# Patient Record
Sex: Female | Born: 1970 | Race: Black or African American | Hispanic: No | Marital: Married | Smoking: Never smoker
Health system: Southern US, Community
[De-identification: ages and names within clinical notes are randomized; demographics above are authoritative.]

## PROBLEM LIST (undated history)

## (undated) DIAGNOSIS — F419 Anxiety disorder, unspecified: Secondary | ICD-10-CM

## (undated) DIAGNOSIS — I10 Essential (primary) hypertension: Secondary | ICD-10-CM

## (undated) DIAGNOSIS — Z9889 Other specified postprocedural states: Secondary | ICD-10-CM

## (undated) DIAGNOSIS — R519 Headache, unspecified: Secondary | ICD-10-CM

## (undated) DIAGNOSIS — E282 Polycystic ovarian syndrome: Secondary | ICD-10-CM

## (undated) DIAGNOSIS — R112 Nausea with vomiting, unspecified: Secondary | ICD-10-CM

## (undated) DIAGNOSIS — M1712 Unilateral primary osteoarthritis, left knee: Secondary | ICD-10-CM

## (undated) DIAGNOSIS — G4730 Sleep apnea, unspecified: Secondary | ICD-10-CM

## (undated) DIAGNOSIS — E785 Hyperlipidemia, unspecified: Secondary | ICD-10-CM

## (undated) DIAGNOSIS — J45909 Unspecified asthma, uncomplicated: Secondary | ICD-10-CM

## (undated) DIAGNOSIS — K219 Gastro-esophageal reflux disease without esophagitis: Secondary | ICD-10-CM

## (undated) DIAGNOSIS — R002 Palpitations: Secondary | ICD-10-CM

## (undated) DIAGNOSIS — E119 Type 2 diabetes mellitus without complications: Secondary | ICD-10-CM

## (undated) HISTORY — PX: WISDOM TOOTH EXTRACTION: SHX21

## (undated) HISTORY — DX: Unspecified asthma, uncomplicated: J45.909

## (undated) HISTORY — DX: Hyperlipidemia, unspecified: E78.5

## (undated) HISTORY — DX: Gastro-esophageal reflux disease without esophagitis: K21.9

## (undated) HISTORY — DX: Polycystic ovarian syndrome: E28.2

---

## 2011-10-29 DIAGNOSIS — J45909 Unspecified asthma, uncomplicated: Secondary | ICD-10-CM | POA: Insufficient documentation

## 2012-10-11 ENCOUNTER — Encounter (HOSPITAL_BASED_OUTPATIENT_CLINIC_OR_DEPARTMENT_OTHER): Payer: Self-pay | Admitting: *Deleted

## 2012-10-11 ENCOUNTER — Emergency Department (HOSPITAL_BASED_OUTPATIENT_CLINIC_OR_DEPARTMENT_OTHER)
Admission: EM | Admit: 2012-10-11 | Discharge: 2012-10-11 | Disposition: A | Discharge status: Home / Self Care | Payer: Commercial Managed Care - PPO | Attending: Emergency Medicine | Admitting: Emergency Medicine

## 2012-10-11 DIAGNOSIS — L600 Ingrowing nail: Secondary | ICD-10-CM

## 2012-10-11 DIAGNOSIS — L089 Local infection of the skin and subcutaneous tissue, unspecified: Secondary | ICD-10-CM

## 2012-10-11 DIAGNOSIS — Z79899 Other long term (current) drug therapy: Secondary | ICD-10-CM | POA: Insufficient documentation

## 2012-10-11 DIAGNOSIS — E119 Type 2 diabetes mellitus without complications: Secondary | ICD-10-CM | POA: Insufficient documentation

## 2012-10-11 HISTORY — DX: Type 2 diabetes mellitus without complications: E11.9

## 2012-10-11 MED ORDER — CEPHALEXIN 500 MG PO CAPS: 500.0000 mg | ORAL_CAPSULE | Freq: Four times a day (QID) | ORAL | Status: DC

## 2012-10-11 MED ORDER — FLUCONAZOLE 150 MG PO TABS: 150.0000 mg | ORAL_TABLET | Freq: Every day | ORAL | Status: DC

## 2012-10-11 MED ORDER — HYDROCODONE-ACETAMINOPHEN 5-325 MG PO TABS: ORAL_TABLET | ORAL | Status: DC

## 2012-10-11 NOTE — ED Provider Notes (Signed)
History     CSN: [id number]  Arrival date & time 10/11/12  1114   First MD Initiated Contact with Patient 10/11/12 1241      Chief Complaint  Patient presents with  . Toe Pain    (Consider location/radiation/quality/duration/timing/severity/associated sxs/prior treatment) HPI Comments: Patient who has a history of poorly controlled diabetes presents with left great toe pain.  She states the pain started about 2 weeks ago and she has noted pus draining at times, most recently 2 days ago.  She went to urgent care 8 days ago and the started her on erythromycin and anti-inflammatories for a toenail infection.  She denies fever or chills.  No N/V/D.  She states the pain in her toe has not gotten any better.  It is painful for her to walk or stand for long periods of time.  She states that her toe is still red and swollen and doesn't think the antibiotics are working.  She states that her sugars run in the upper 100s or low 200s.  She has tried warm sitz baths in epsom salts and has tried soaking her toe in hydrogen peroxide.  She has bought OTC ingrown toenail treatment which seemed to make pain worse. Onset gradual with no relief of her symptoms.    Patient is a 42 y.o. female presenting with toe pain. The history is provided by the patient.  Toe Pain Pertinent negatives include no abdominal pain, arthralgias, fever, headaches, joint swelling, nausea, neck pain, numbness, rash, vomiting or weakness.    Past Medical History  Diagnosis Date  . Diabetes mellitus without complication     No past surgical history on file.  No family history on file.  History  Substance Use Topics  . Smoking status: Never Smoker   . Smokeless tobacco: Not on file  . Alcohol Use: No    OB History    Grav Para Term Preterm Abortions TAB SAB Ect Mult Living                  Review of Systems  Constitutional: Negative for fever and activity change.  HENT: Negative for neck pain.   Gastrointestinal:  Negative for nausea, vomiting, abdominal pain and diarrhea.  Musculoskeletal: Negative for back pain, joint swelling and arthralgias.       Left great toe pain.  Skin: Positive for wound. Negative for rash.       Redness and swelling of left great toe.  Neurological: Negative for weakness, numbness and headaches.    Allergies  Macrodantin  Home Medications   Current Outpatient Rx  Name  Route  Sig  Dispense  Refill  . ALBUTEROL SULFATE (2.5 MG/3ML) 0.083% IN NEBU   Nebulization   Take 2.5 mg by nebulization every 6 (six) hours as needed.         . ALPRAZOLAM 1 MG PO TABS   Oral   Take 1 mg by mouth at bedtime as needed.         . ATORVASTATIN CALCIUM 10 MG PO TABS   Oral   Take 10 mg by mouth daily.         . BECLOMETHASONE DIPROPIONATE 40 MCG/ACT IN AERS   Inhalation   Inhale 2 puffs into the lungs 2 (two) times daily.         Marland Kitchen LANSOPRAZOLE 30 MG PO CPDR   Oral   Take 30 mg by mouth daily.         Marland Kitchen METFORMIN HCL 1000 MG  PO TABS   Oral   Take 1,000 mg by mouth 2 (two) times daily with a meal.         . RANITIDINE HCL 75 MG PO TABS   Oral   Take 75 mg by mouth 2 (two) times daily.         Marland Kitchen ZOLPIDEM TARTRATE 10 MG PO TABS   Oral   Take 10 mg by mouth at bedtime as needed.           There were no vitals taken for this visit.  Physical Exam  Nursing note and vitals reviewed. Constitutional: She is oriented to person, place, and time. She appears well-developed and well-nourished.  HENT:  Head: Normocephalic and atraumatic.  Eyes: Conjunctivae normal are normal.  Neck: Normal range of motion. Neck supple.  Pulmonary/Chest: No respiratory distress.  Neurological: She is alert and oriented to person, place, and time.       Sensation normal in lower extremities bilaterally.   Skin: Skin is warm, dry and intact. No lesion and no rash noted. There is erythema.          Mild erythema and swelling along medial aspect of left great toenail.  No  open sore or drainage.  Toenail appears ingrown.   Psychiatric: She has a normal mood and affect.    ED Course  Procedures (including critical care time)  Labs Reviewed - No data to display No results found.   1. Toe infection   2. Ingrown toenail     1:29 PM Patient seen and examined. Will switch abx to keflex. Will given pain meds. Patient requests diflucan. Urged podiatry follow-up.  Vital signs reviewed and are as follows: Filed Vitals:   10/11/12 1310  BP: 112/62  Pulse: 84  Resp: 18   Patient counseled on use of narcotic pain medications. Counseled not to combine these medications with others containing tylenol. Urged not to drink alcohol, drive, or perform any other activities that requires focus while taking these medications. The patient verbalizes understanding and agrees with the plan.    MDM  Patient with ingrown toenail and likely mild infection given history. Does not appear overly erythematous today. No drainage today. Given history of recent drainage on erythromycin, will change to Keflex. Patient complains pain. Given history of poorly controlled diabetes will defer excision of toenail podiatry. No systemic symptoms.       Renne Crigler, [place] 10/11/12 1347

## 2012-10-11 NOTE — ED Notes (Signed)
Pt having left toe pain.  Started to have pus coming out on toe.  Pt seen at urgent care.  Pt on antibiotics and anti-inflamatory medication.  Pt feels she has an ingrown toenail.

## 2012-10-15 NOTE — ED Provider Notes (Signed)
Medical screening examination/treatment/procedure(s) were performed by non-physician practitioner and as supervising physician I was immediately available for consultation/collaboration.   Loren Racer, MD 10/15/12 2233

## 2012-12-08 ENCOUNTER — Ambulatory Visit: Payer: Self-pay

## 2012-12-23 ENCOUNTER — Ambulatory Visit: Payer: Self-pay

## 2013-08-24 ENCOUNTER — Ambulatory Visit: Payer: Self-pay

## 2013-09-24 ENCOUNTER — Ambulatory Visit: Payer: Self-pay

## 2013-11-23 ENCOUNTER — Ambulatory Visit: Payer: Self-pay

## 2013-12-23 ENCOUNTER — Ambulatory Visit: Payer: Self-pay

## 2014-06-04 ENCOUNTER — Other Ambulatory Visit: Payer: Self-pay

## 2014-06-04 LAB — BASIC METABOLIC PANEL
ANION GAP: 7 (ref 7–16)
BUN: 13 mg/dL (ref 7–18)
CHLORIDE: 108 mmol/L — AB (ref 98–107)
CO2: 25 mmol/L (ref 21–32)
CREATININE: 0.84 mg/dL (ref 0.60–1.30)
Calcium, Total: 8.5 mg/dL (ref 8.5–10.1)
EGFR (Non-African Amer.): >60
Glucose: 124 mg/dL — ABNORMAL HIGH (ref 65–99)
OSMOLALITY: 281 (ref 275–301)
Potassium: 4 mmol/L (ref 3.5–5.1)
Sodium: 140 mmol/L (ref 136–145)

## 2014-10-07 ENCOUNTER — Emergency Department (HOSPITAL_BASED_OUTPATIENT_CLINIC_OR_DEPARTMENT_OTHER): Payer: 59

## 2014-10-07 ENCOUNTER — Emergency Department (HOSPITAL_BASED_OUTPATIENT_CLINIC_OR_DEPARTMENT_OTHER)
Admission: EM | Admit: 2014-10-07 | Discharge: 2014-10-07 | Disposition: A | Discharge status: Home / Self Care | Payer: 59 | Attending: Emergency Medicine | Admitting: Emergency Medicine

## 2014-10-07 ENCOUNTER — Encounter (HOSPITAL_BASED_OUTPATIENT_CLINIC_OR_DEPARTMENT_OTHER): Payer: Self-pay | Admitting: *Deleted

## 2014-10-07 DIAGNOSIS — Z792 Long term (current) use of antibiotics: Secondary | ICD-10-CM | POA: Diagnosis not present

## 2014-10-07 DIAGNOSIS — R0789 Other chest pain: Secondary | ICD-10-CM | POA: Insufficient documentation

## 2014-10-07 DIAGNOSIS — Z79899 Other long term (current) drug therapy: Secondary | ICD-10-CM | POA: Insufficient documentation

## 2014-10-07 DIAGNOSIS — R42 Dizziness and giddiness: Secondary | ICD-10-CM | POA: Diagnosis not present

## 2014-10-07 DIAGNOSIS — E119 Type 2 diabetes mellitus without complications: Secondary | ICD-10-CM | POA: Diagnosis not present

## 2014-10-07 DIAGNOSIS — R002 Palpitations: Secondary | ICD-10-CM | POA: Diagnosis present

## 2014-10-07 LAB — CBC WITH DIFFERENTIAL/PLATELET
Basophils Absolute: 0 10*3/uL (ref 0.0–0.1)
Basophils Relative: 0 % (ref 0–1)
Eosinophils Absolute: 0 10*3/uL (ref 0.0–0.7)
Eosinophils Relative: 0 % (ref 0–5)
HCT: 38.6 % (ref 36.0–46.0)
HEMOGLOBIN: 12.3 g/dL (ref 12.0–15.0)
Lymphocytes Relative: 29 % (ref 12–46)
Lymphs Abs: 2.5 10*3/uL (ref 0.7–4.0)
MCH: 28.1 pg (ref 26.0–34.0)
MCHC: 31.9 g/dL (ref 30.0–36.0)
MCV: 88.1 fL (ref 78.0–100.0)
MONOS PCT: 5 % (ref 3–12)
Monocytes Absolute: 0.5 10*3/uL (ref 0.1–1.0)
NEUTROS ABS: 5.6 10*3/uL (ref 1.7–7.7)
Neutrophils Relative %: 66 % (ref 43–77)
PLATELETS: 390 10*3/uL (ref 150–400)
RBC: 4.38 MIL/uL (ref 3.87–5.11)
RDW: 14 % (ref 11.5–15.5)
WBC: 8.6 10*3/uL (ref 4.0–10.5)

## 2014-10-07 LAB — COMPREHENSIVE METABOLIC PANEL
ALK PHOS: 100 U/L (ref 39–117)
ALT: 11 U/L (ref 0–35)
AST: 13 U/L (ref 0–37)
Albumin: 3.4 g/dL — ABNORMAL LOW (ref 3.5–5.2)
Anion gap: 7 (ref 5–15)
BILIRUBIN TOTAL: 0.3 mg/dL (ref 0.3–1.2)
BUN: 13 mg/dL (ref 6–23)
CHLORIDE: 102 meq/L (ref 96–112)
CO2: 25 mmol/L (ref 19–32)
Calcium: 8.5 mg/dL (ref 8.4–10.5)
Creatinine, Ser: 0.81 mg/dL (ref 0.50–1.10)
GFR calc Af Amer: >90 mL/min (ref >90)
GFR, EST NON AFRICAN AMERICAN: 88 mL/min — AB (ref >90)
GLUCOSE: 192 mg/dL — AB (ref 70–99)
POTASSIUM: 3.4 mmol/L — AB (ref 3.5–5.1)
Sodium: 134 mmol/L — ABNORMAL LOW (ref 135–145)
Total Protein: 7.4 g/dL (ref 6.0–8.3)

## 2014-10-07 LAB — TROPONIN I: Troponin I: <0.03 ng/mL (ref <0.031)

## 2014-10-07 IMAGING — CR DG CHEST 2V
2 series · 2 of 2 positions shown · non-contrast
Comparison: Two-view chest [DATE]

CLINICAL DATA: Heart palpitations since [REDACTED]. Mid chest pressure
beginning last night. Chest congestion for several weeks.

EXAM:
CHEST  2 VIEW

[w chest pa]
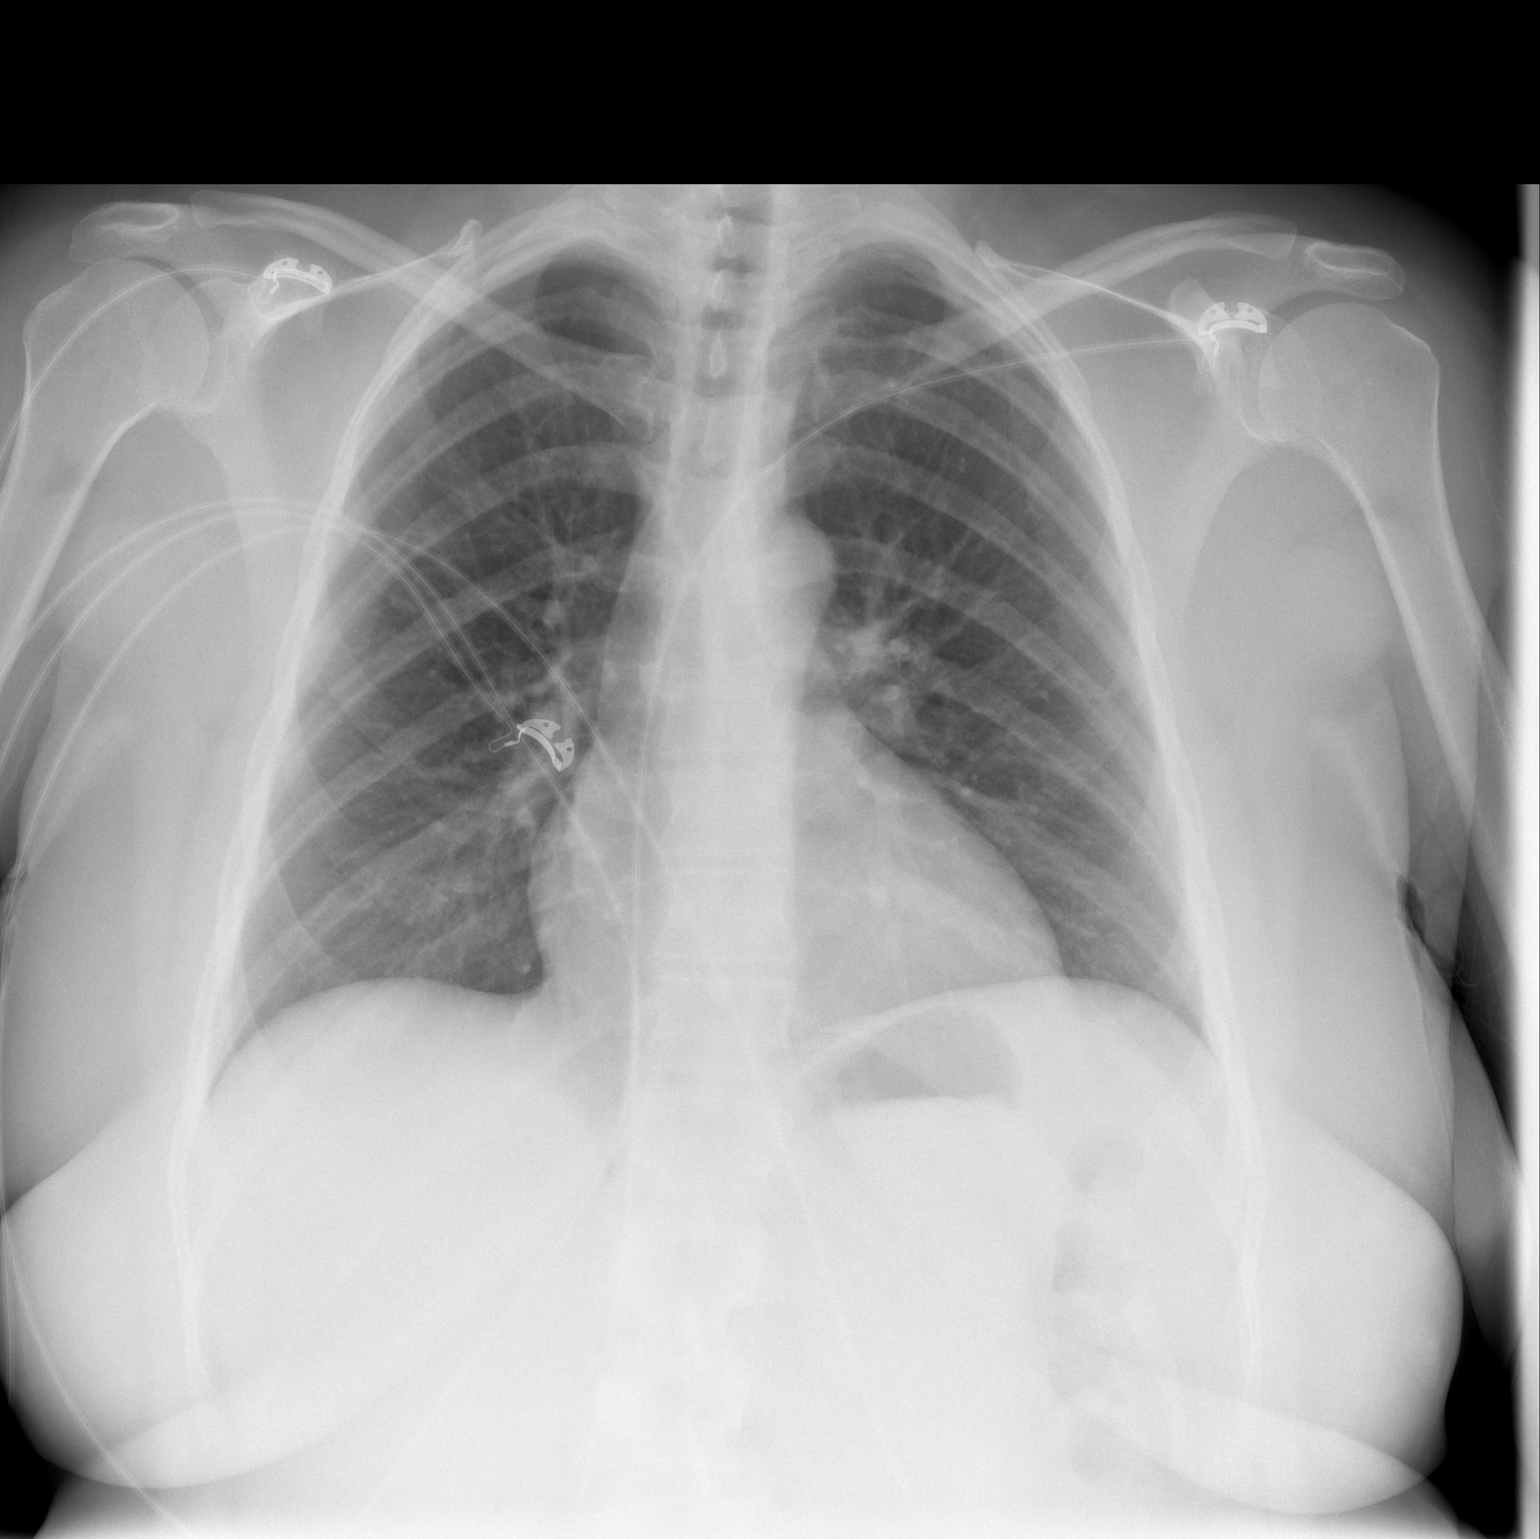

[w chest lat]
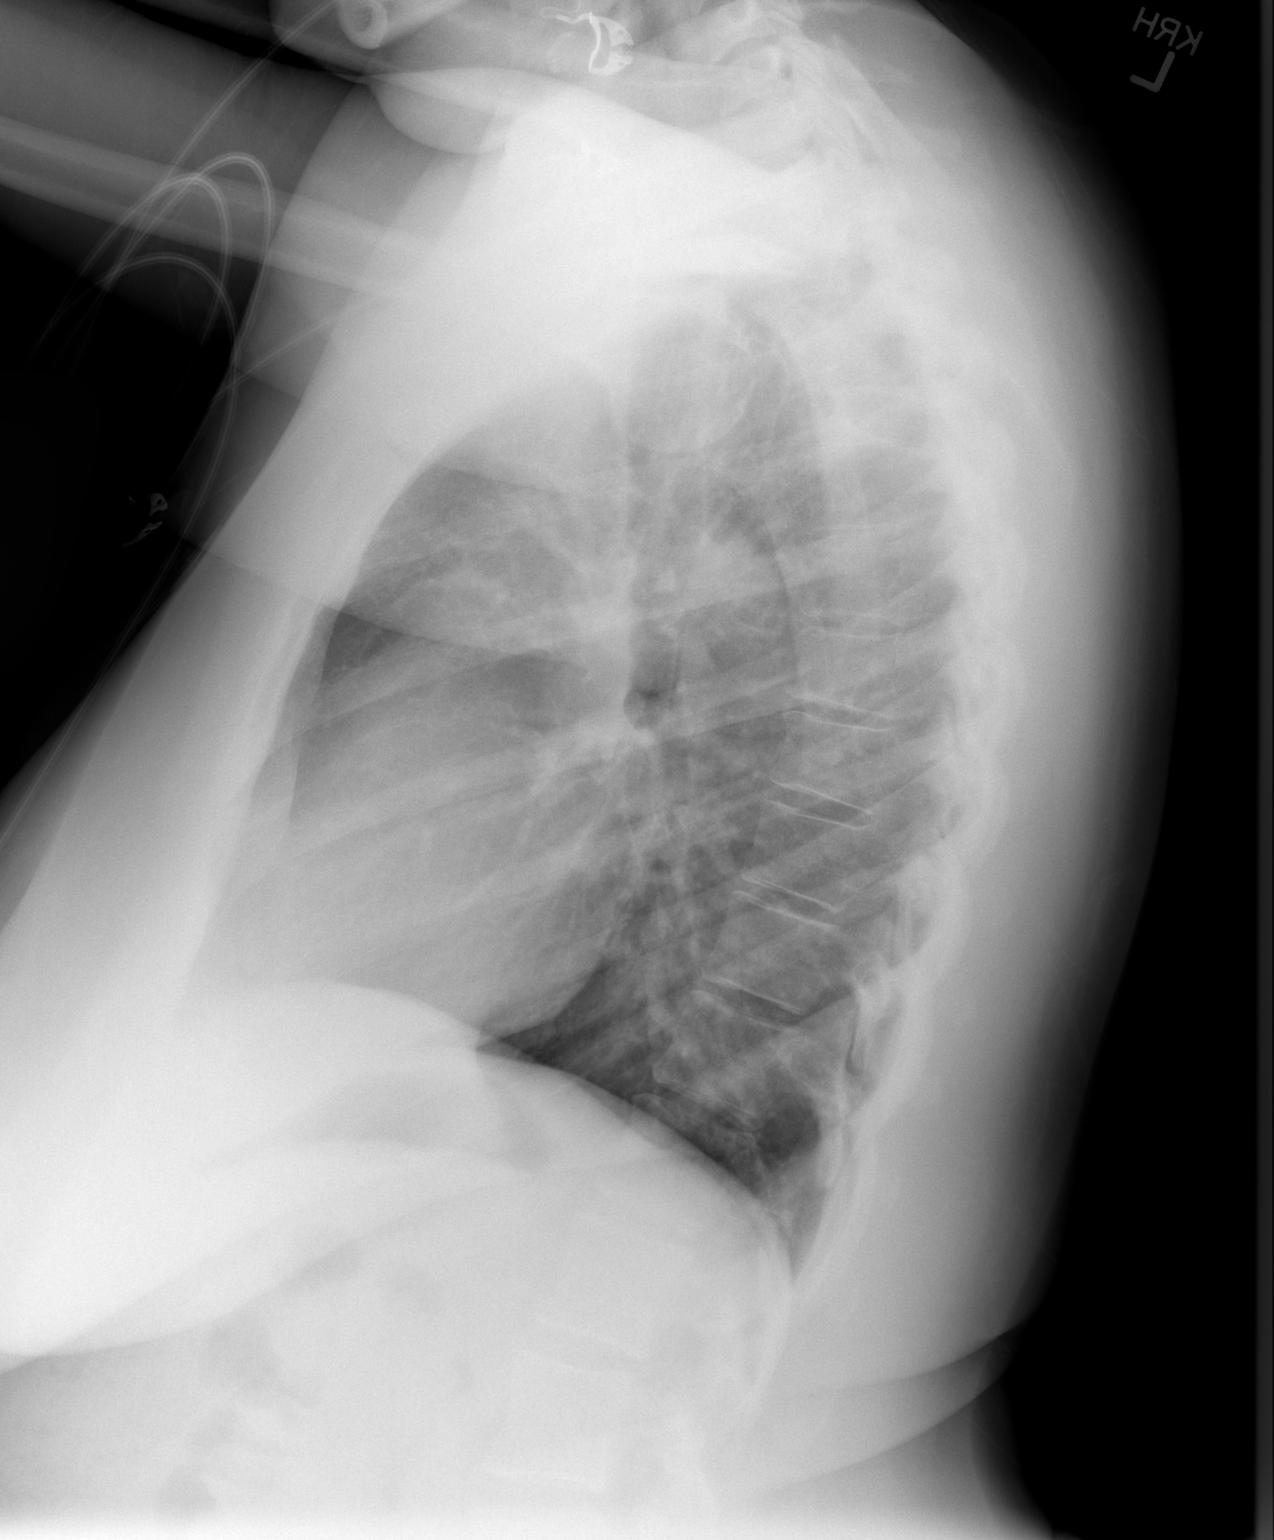

[2 of 2 positions shown; findings below may reference images not displayed]

FINDINGS: The heart size and mediastinal contours are within normal limits.
Both lungs are clear. The visualized skeletal structures are
unremarkable.
IMPRESSION: Negative two view chest x-ray

## 2014-10-07 MED ORDER — SODIUM CHLORIDE 0.9 % IV BOLUS (SEPSIS)
1000.0000 mL | Freq: Once | INTRAVENOUS | Status: AC
  Administered 2014-10-07: 1000 mL via INTRAVENOUS

## 2014-10-07 NOTE — ED Notes (Signed)
Palpitations since Monday.  Chest pressure since last night. She was seen at [facility] Tuesday for palpitations and started on Augmentin and Allegra for continued bronchitis and new onset dizziness. Was treated for bronchitis and inner ear last week.

## 2014-10-07 NOTE — ED Provider Notes (Signed)
CSN: [id number]     Arrival date & time 10/07/14  1314 History   First MD Initiated Contact with Patient 10/07/14 1324     Chief Complaint  Patient presents with  . Palpitations     (Consider location/radiation/quality/duration/timing/severity/associated sxs/prior Treatment) HPI Comments: Patient is a 44 year old female with history of diabetes, high cholesterol, acid reflux, and asthma. She presents today with complaints of cough for several weeks. She has been treated by her primary doctor with both Zithromax and Augmentin, however these have not helped. She reports intermittent productive cough but denies any chest pain, significant shortness of breath. She is also felt intermittently dizzy like she is "floating" and is also noted occasional skipped beats with her heart. She has felt somewhat tight in the chest intermittently.  Patient is a 44 y.o. female presenting with palpitations. The history is provided by the patient.  Palpitations Palpitations quality:  Irregular Onset quality:  Sudden Timing:  Intermittent Progression:  Worsening Chronicity:  New Context comment:  URI symptoms Relieved by:  Nothing Worsened by:  Nothing tried Ineffective treatments:  None tried   Past Medical History  Diagnosis Date  . Diabetes mellitus without complication    History reviewed. No pertinent past surgical history. No family history on file. History  Substance Use Topics  . Smoking status: Never Smoker   . Smokeless tobacco: Not on file  . Alcohol Use: Yes   OB History    No data available     Review of Systems  Cardiovascular: Positive for palpitations.  All other systems reviewed and are negative.     Allergies  Macrodantin  Home Medications   Prior to Admission medications   Medication Sig Start Date End Date Taking? Authorizing Provider  albuterol (PROVENTIL) (2.5 MG/3ML) 0.083% nebulizer solution Take 2.5 mg by nebulization every 6 (six) hours as needed.     Historical Provider, MD  ALPRAZolam Duanne Moron) 1 MG tablet Take 1 mg by mouth at bedtime as needed.    Historical Provider, MD  atorvastatin (LIPITOR) 10 MG tablet Take 10 mg by mouth daily.    Historical Provider, MD  beclomethasone (QVAR) 40 MCG/ACT inhaler Inhale 2 puffs into the lungs 2 (two) times daily.    Historical Provider, MD  cephALEXin (KEFLEX) 500 MG capsule Take 1 capsule (500 mg total) by mouth 4 (four) times daily. 10/11/12   Carlisle Cater, PA-C  fluconazole (DIFLUCAN) 150 MG tablet Take 1 tablet (150 mg total) by mouth daily. 10/11/12   Carlisle Cater, PA-C  HYDROcodone-acetaminophen (NORCO/VICODIN) 5-325 MG per tablet Take 1-2 tablets every 6 hours as needed for severe pain 10/11/12   Carlisle Cater, PA-C  lansoprazole (PREVACID) 30 MG capsule Take 30 mg by mouth daily.    Historical Provider, MD  metFORMIN (GLUCOPHAGE) 1000 MG tablet Take 1,000 mg by mouth 2 (two) times daily with a meal.    Historical Provider, MD  ranitidine (ZANTAC) 75 MG tablet Take 75 mg by mouth 2 (two) times daily.    Historical Provider, MD  zolpidem (AMBIEN) 10 MG tablet Take 10 mg by mouth at bedtime as needed.    Historical Provider, MD   BP 140/56 mmHg  Pulse 75  Temp(Src) 98 F (36.7 C) (Oral)  Resp 18  Ht 5\' 4"  (1.626 m)  Wt 205 lb (92.987 kg)  BMI 35.17 kg/m2  SpO2 100%  LMP 10/04/2014 Physical Exam  Constitutional: She is oriented to person, place, and time. She appears well-developed and well-nourished. No distress.  HENT:  Head: Normocephalic and atraumatic.  Mouth/Throat: Oropharynx is clear and moist.  Neck: Normal range of motion. Neck supple.  Cardiovascular: Normal rate, regular rhythm and normal heart sounds.   Pulmonary/Chest: Effort normal and breath sounds normal. No respiratory distress. She has no wheezes.  Abdominal: Soft. Bowel sounds are normal. She exhibits no distension. There is no tenderness.  Musculoskeletal: Normal range of motion. She exhibits no edema.   Lymphadenopathy:    She has no cervical adenopathy.  Neurological: She is alert and oriented to person, place, and time.  Skin: Skin is warm and dry. She is not diaphoretic.  Nursing note and vitals reviewed.   ED Course  Procedures (including critical care time) Labs Review Labs Reviewed  COMPREHENSIVE METABOLIC PANEL  TROPONIN I  CBC WITH DIFFERENTIAL    Imaging Review No results found.   EKG Interpretation   Date/Time:  Thursday October 07 2014 13:27:55 EST Ventricular Rate:  70 PR Interval:  180 QRS Duration: 76 QT Interval:  402 QTC Calculation: 434 R Axis:   35 Text Interpretation:  Normal sinus rhythm Septal infarct , age  undetermined Abnormal ECG Confirmed by DELOS  MD, Eurika Sandy ([id number]) on  10/07/2014 2:53:56 PM      MDM   Final diagnoses:  None    Patient is a 44 year old female who presents with complaints of burning in her chest, palpitations, cough, dizziness that has been ongoing for the past week. She has been treated by her primary doctor with 2 separate antibiotics and to different cough medications, however her symptoms persist. Her physical examination is unremarkable and laboratory studies reveal no significant abnormality. Her EKG and troponin are negative. Her symptoms sound infectious in nature. As she has been on 2 separate antibiotics, I suspect that this illness is viral. I will advise her to continue her medications as before and follow-up with her Dr. if not improving in the next week. Her blood pressure dropped slightly with orthostatic vital signs. She was given 1 L of normal saline for this.    Veryl Speak, MD 10/07/14 [phone]

## 2014-10-07 NOTE — ED Notes (Signed)
Pt placed on cardiac monitor 

## 2014-10-07 NOTE — Discharge Instructions (Signed)
Continue your medications as before.  Follow-up with your primary Dr. if not improving in 1 week, and return to the emergency department if your symptoms substantially worsen or change.   Chest Pain (Nonspecific) It is often hard to give a specific diagnosis for the cause of chest pain. There is always a chance that your pain could be related to something serious, such as a heart attack or a blood clot in the lungs. You need to follow up with your health care provider for further evaluation. CAUSES   Heartburn.  Pneumonia or bronchitis.  Anxiety or stress.  Inflammation around your heart (pericarditis) or lung (pleuritis or pleurisy).  A blood clot in the lung.  A collapsed lung (pneumothorax). It can develop suddenly on its own (spontaneous pneumothorax) or from trauma to the chest.  Shingles infection (herpes zoster virus). The chest wall is composed of bones, muscles, and cartilage. Any of these can be the source of the pain.  The bones can be bruised by injury.  The muscles or cartilage can be strained by coughing or overwork.  The cartilage can be affected by inflammation and become sore (costochondritis). DIAGNOSIS  Lab tests or other studies may be needed to find the cause of your pain. Your health care provider may have you take a test called an ambulatory electrocardiogram (ECG). An ECG records your heartbeat patterns over a 24-hour period. You may also have other tests, such as:  Transthoracic echocardiogram (TTE). During echocardiography, sound waves are used to evaluate how blood flows through your heart.  Transesophageal echocardiogram (TEE).  Cardiac monitoring. This allows your health care provider to monitor your heart rate and rhythm in real time.  Holter monitor. This is a portable device that records your heartbeat and can help diagnose heart arrhythmias. It allows your health care provider to track your heart activity for several days, if needed.  Stress  tests by exercise or by giving medicine that makes the heart beat faster. TREATMENT   Treatment depends on what may be causing your chest pain. Treatment may include:  Acid blockers for heartburn.  Anti-inflammatory medicine.  Pain medicine for inflammatory conditions.  Antibiotics if an infection is present.  You may be advised to change lifestyle habits. This includes stopping smoking and avoiding alcohol, caffeine, and chocolate.  You may be advised to keep your head raised (elevated) when sleeping. This reduces the chance of acid going backward from your stomach into your esophagus. Most of the time, nonspecific chest pain will improve within 2-3 days with rest and mild pain medicine.  HOME CARE INSTRUCTIONS   If antibiotics were prescribed, take them as directed. Finish them even if you start to feel better.  For the next few days, avoid physical activities that bring on chest pain. Continue physical activities as directed.  Do not use any tobacco products, including cigarettes, chewing tobacco, or electronic cigarettes.  Avoid drinking alcohol.  Only take medicine as directed by your health care provider.  Follow your health care provider's suggestions for further testing if your chest pain does not go away.  Keep any follow-up appointments you made. If you do not go to an appointment, you could develop lasting (chronic) problems with pain. If there is any problem keeping an appointment, call to reschedule. SEEK MEDICAL CARE IF:   Your chest pain does not go away, even after treatment.  You have a rash with blisters on your chest.  You have a fever. SEEK IMMEDIATE MEDICAL CARE IF:  You have increased chest pain or pain that spreads to your arm, neck, jaw, back, or abdomen.  You have shortness of breath.  You have an increasing cough, or you cough up blood.  You have severe back or abdominal pain.  You feel nauseous or vomit.  You have severe weakness.  You  faint.  You have chills. This is an emergency. Do not wait to see if the pain will go away. Get medical help at once. Call your local emergency services (911 in [place] Do not drive yourself to the hospital. MAKE SURE YOU:   Understand these instructions.  Will watch your condition.  Will get help right away if you are not doing well or get worse. Document Released: 06/20/2005 Document Revised: 09/15/2013 Document Reviewed: 04/15/2008 [facility] Patient Information 2015 [place], [place]. This information is not intended to replace advice given to you by your health care provider. Make sure you discuss any questions you have with your health care provider.  Dizziness Dizziness is a common problem. It is a feeling of unsteadiness or light-headedness. You may feel like you are about to faint. Dizziness can lead to injury if you stumble or fall. A person of any age group can suffer from dizziness, but dizziness is more common in older adults. CAUSES  Dizziness can be caused by many different things, including:  Middle ear problems.  Standing for too long.  Infections.  An allergic reaction.  Aging.  An emotional response to something, such as the sight of blood.  Side effects of medicines.  Tiredness.  Problems with circulation or blood pressure.  Excessive use of alcohol or medicines, or illegal drug use.  Breathing too fast (hyperventilation).  An irregular heart rhythm (arrhythmia).  A low red blood cell count (anemia).  Pregnancy.  Vomiting, diarrhea, fever, or other illnesses that cause body fluid loss (dehydration).  Diseases or conditions such as Parkinson's disease, high blood pressure (hypertension), diabetes, and thyroid problems.  Exposure to extreme heat. DIAGNOSIS  Your health care provider will ask about your symptoms, perform a physical exam, and perform an electrocardiogram (ECG) to record the electrical activity of your heart. Your health care provider  may also perform other heart or blood tests to determine the cause of your dizziness. These may include:  Transthoracic echocardiogram (TTE). During echocardiography, sound waves are used to evaluate how blood flows through your heart.  Transesophageal echocardiogram (TEE).  Cardiac monitoring. This allows your health care provider to monitor your heart rate and rhythm in real time.  Holter monitor. This is a portable device that records your heartbeat and can help diagnose heart arrhythmias. It allows your health care provider to track your heart activity for several days if needed.  Stress tests by exercise or by giving medicine that makes the heart beat faster. TREATMENT  Treatment of dizziness depends on the cause of your symptoms and can vary greatly. HOME CARE INSTRUCTIONS   Drink enough fluids to keep your urine clear or pale yellow. This is especially important in very hot weather. In older adults, it is also important in cold weather.  Take your medicine exactly as directed if your dizziness is caused by medicines. When taking blood pressure medicines, it is especially important to get up slowly.  Rise slowly from chairs and steady yourself until you feel okay.  In the morning, first sit up on the side of the bed. When you feel okay, stand slowly while holding onto something until you know your balance is fine.  Move your legs often if you need to stand in one place for a long time. Tighten and relax your muscles in your legs while standing.  Have someone stay with you for 1-2 days if dizziness continues to be a problem. Do this until you feel you are well enough to stay alone. Have the person call your health care provider if he or she notices changes in you that are concerning.  Do not drive or use heavy machinery if you feel dizzy.  Do not drink alcohol. SEEK IMMEDIATE MEDICAL CARE IF:   Your dizziness or light-headedness gets worse.  You feel nauseous or vomit.  You  have problems talking, walking, or using your arms, hands, or legs.  You feel weak.  You are not thinking clearly or you have trouble forming sentences. It may take a friend or family member to notice this.  You have chest pain, abdominal pain, shortness of breath, or sweating.  Your vision changes.  You notice any bleeding.  You have side effects from medicine that seems to be getting worse rather than better. MAKE SURE YOU:   Understand these instructions.  Will watch your condition.  Will get help right away if you are not doing well or get worse. Document Released: 03/06/2001 Document Revised: 09/15/2013 Document Reviewed: 03/30/2011 [facility] Patient Information 2015 [place], [place]. This information is not intended to replace advice given to you by your health care provider. Make sure you discuss any questions you have with your health care provider.

## 2015-02-16 DIAGNOSIS — G4733 Obstructive sleep apnea (adult) (pediatric): Secondary | ICD-10-CM | POA: Insufficient documentation

## 2015-03-29 ENCOUNTER — Other Ambulatory Visit: Payer: Self-pay

## 2015-03-29 NOTE — Patient Instructions (Signed)
Diabetes and Exercise Exercising regularly is important. It is not just about losing weight. It has many health benefits, such as:  Improving your overall fitness, flexibility, and endurance.  Increasing your bone density.  Helping with weight control.  Decreasing your body fat.  Increasing your muscle strength.  Reducing stress and tension.  Improving your overall health. People with diabetes who exercise gain additional benefits because exercise:  Reduces appetite.  Improves the body's use of blood sugar (glucose).  Helps lower or control blood glucose.  Decreases blood pressure.  Helps control blood lipids (such as cholesterol and triglycerides).  Improves the body's use of the hormone insulin by:  Increasing the body's insulin sensitivity.  Reducing the body's insulin needs.  Decreases the risk for heart disease because exercising:  Lowers cholesterol and triglycerides levels.  Increases the levels of good cholesterol (such as high-density lipoproteins [HDL]) in the body.  Lowers blood glucose levels. YOUR ACTIVITY PLAN  Choose an activity that you enjoy and set realistic goals. Your health care provider or diabetes educator can help you make an activity plan that works for you. Exercise regularly as directed by your health care provider. This includes:  Performing resistance training twice a week such as push-ups, sit-ups, lifting weights, or using resistance bands.  Performing 150 minutes of cardio exercises each week such as walking, running, or playing sports.  Staying active and spending no more than 90 minutes at one time being inactive. Even short bursts of exercise are good for you. Three 10-minute sessions spread throughout the day are just as beneficial as a single 30-minute session. Some exercise ideas include:  Taking the dog for a walk.  Taking the stairs instead of the elevator.  Dancing to your favorite song.  Doing an exercise  video.  Doing your favorite exercise with a friend. RECOMMENDATIONS FOR EXERCISING WITH TYPE 1 OR TYPE 2 DIABETES   Check your blood glucose before exercising. If blood glucose levels are greater than 240 mg/dL, check for urine ketones. Do not exercise if ketones are present.  Avoid injecting insulin into areas of the body that are going to be exercised. For example, avoid injecting insulin into:  The arms when playing tennis.  The legs when jogging.  Keep a record of:  Food intake before and after you exercise.  Expected peak times of insulin action.  Blood glucose levels before and after you exercise.  The type and amount of exercise you have done.  Review your records with your health care provider. Your health care provider will help you to develop guidelines for adjusting food intake and insulin amounts before and after exercising.  If you take insulin or oral hypoglycemic agents, watch for signs and symptoms of hypoglycemia. They include:  Dizziness.  Shaking.  Sweating.  Chills.  Confusion.  Drink plenty of water while you exercise to prevent dehydration or heat stroke. Body water is lost during exercise and must be replaced.  Talk to your health care provider before starting an exercise program to make sure it is safe for you. Remember, almost any type of activity is better than none. Document Released: 12/01/2003 Document Revised: 01/25/2014 Document Reviewed: 02/17/2013 ExitCare Patient Information 2015 [employer]. This information is not intended to replace advice given to you by your health care provider. Make sure you discuss any questions you have with your health care provider.  

## 2015-03-29 NOTE — Patient Outreach (Signed)
[facility]) Care Management  [place]  03/29/2015   Julie Hansen 07/01/1971 [id number]  Subjective: Patient tells me she has seen a psychiatrist to help with medications.  She has also started seeing EAP at work. The psychiatrist wants Julie Hansen to walk 4x/week for 20 minutes each time and to measure herself.   Objective: Blood sugar reports reveal blood sugars 68-174mg /gl before meals (breakfast and supper)- but generally they are around 120mg /dl. She has not taken any post meal blood sugars.   Current Medications:  Current Outpatient Prescriptions  Medication Sig Dispense Refill  . ALPRAZolam (XANAX) 1 MG tablet Take 1 mg by mouth 3 (three) times daily as needed.     Marland Kitchen atorvastatin (LIPITOR) 10 MG tablet Take 10 mg by mouth daily.    . canagliflozin (INVOKANA) 300 MG TABS tablet Take 300 mg by mouth daily before breakfast.    . glimepiride (AMARYL) 4 MG tablet Take 4 mg by mouth daily with breakfast.    . lisinopril (PRINIVIL,ZESTRIL) 5 MG tablet Take 5 mg by mouth daily.    Marland Kitchen lurasidone (LATUDA) 40 MG TABS tablet Take 40 mg by mouth daily with breakfast.    . metFORMIN (GLUCOPHAGE) 1000 MG tablet Take 1,000 mg by mouth 2 (two) times daily with a meal. Only taking the morning dose- not taking bid as ordered    . ranitidine (ZANTAC) 300 MG tablet Take 300 mg by mouth at bedtime.    Marland Kitchen zolpidem (AMBIEN) 10 MG tablet Take 10 mg by mouth at bedtime as needed.    Marland Kitchen albuterol (PROVENTIL) (2.5 MG/3ML) 0.083% nebulizer solution Take 2.5 mg by nebulization every 6 (six) hours as needed.    . beclomethasone (QVAR) 40 MCG/ACT inhaler Inhale 2 puffs into the lungs 2 (two) times daily.    . cephALEXin (KEFLEX) 500 MG capsule Take 1 capsule (500 mg total) by mouth 4 (four) times daily. (Patient not taking: Reported on 03/29/2015) 28 capsule 0  . fluconazole (DIFLUCAN) 150 MG tablet Take 1 tablet (150 mg total) by mouth daily. (Patient not taking: Reported on 03/29/2015) 1  tablet 0  . HYDROcodone-acetaminophen (NORCO/VICODIN) 5-325 MG per tablet Take 1-2 tablets every 6 hours as needed for severe pain 12 tablet 0  . lansoprazole (PREVACID) 30 MG capsule Take 30 mg by mouth 2 (two) times daily before a meal.     . ranitidine (ZANTAC) 75 MG tablet Take 75 mg by mouth 2 (two) times daily.     No current facility-administered medications for this visit.    Fall/Depression Screening: PHQ 2/9 Scores 03/29/2015  PHQ - 2 Score 2    Assessment: There is a noticeable slowing down of Julie Hansen's speech since stating on [place].  She states that she's much calmer but she still hasn't been able to engage in any of the exercise her MD has asked her to do.  She states she'll start exercising next week when she's on vacation and will start a cleansing diet using a product she calls "Standard process" on Monday, April 04, 2015 (she will eat 3 meals per day). She will continue to see her psychiatrist although every 4 weeks, vs every 6-8 weeks that's she's normally been doing.  She continues at [employer]. She is currently trying to find a therapist closer to [place] (rather than [place]).   Plan:  She needs to schedule preventative health tests; mammogram, dental, foot doctor.   She needs to continue to attend MD visits she already has scheduled;  family doctor, endocrinology, EACP, psychiatrist.   Eat three meals per day- plan and bring food to work. Avoid sweetened beverages.  F/U with me in 3 months.   Gentry Fitz, RN, BA, [place], [place] Direct Dial:  [phone]  Fax:  [phone] E-mail: [email or web address] [address], [place], [place]  [address]

## 2015-04-14 ENCOUNTER — Other Ambulatory Visit
Admission: RE | Admit: 2015-04-14 | Discharge: 2015-04-14 | Disposition: A | Discharge status: Home / Self Care | Payer: 59 | Source: Ambulatory Visit | Attending: Internal Medicine | Admitting: Internal Medicine

## 2015-04-14 DIAGNOSIS — E119 Type 2 diabetes mellitus without complications: Secondary | ICD-10-CM | POA: Diagnosis not present

## 2015-04-14 LAB — COMPREHENSIVE METABOLIC PANEL
ALK PHOS: 66 U/L (ref 38–126)
ALT: 12 U/L — ABNORMAL LOW (ref 14–54)
ANION GAP: 8 (ref 5–15)
AST: 13 U/L — AB (ref 15–41)
Albumin: 3.9 g/dL (ref 3.5–5.0)
BILIRUBIN TOTAL: 0.4 mg/dL (ref 0.3–1.2)
BUN: 13 mg/dL (ref 6–20)
CO2: 26 mmol/L (ref 22–32)
Calcium: 8.8 mg/dL — ABNORMAL LOW (ref 8.9–10.3)
Chloride: 105 mmol/L (ref 101–111)
Creatinine, Ser: 0.81 mg/dL (ref 0.44–1.00)
GFR calc Af Amer: >60 mL/min (ref >60)
Glucose, Bld: 128 mg/dL — ABNORMAL HIGH (ref 65–99)
Potassium: 3.3 mmol/L — ABNORMAL LOW (ref 3.5–5.1)
Sodium: 139 mmol/L (ref 135–145)
Total Protein: 7.7 g/dL (ref 6.5–8.1)

## 2015-04-15 LAB — MICROALBUMIN / CREATININE URINE RATIO
CREATININE, UR: 87.7 mg/dL
Microalb Creat Ratio: 6.5 mg/g creat (ref 0.0–30.0)
Microalb, Ur: 5.7 ug/mL — ABNORMAL HIGH

## 2015-04-15 LAB — HEMOGLOBIN A1C: HEMOGLOBIN A1C: 6.7 % — AB (ref 4.0–6.0)

## 2015-07-06 ENCOUNTER — Other Ambulatory Visit
Admission: RE | Admit: 2015-07-06 | Discharge: 2015-07-06 | Disposition: A | Discharge status: Home / Self Care | Payer: 59 | Source: Ambulatory Visit | Attending: Nurse Practitioner | Admitting: Nurse Practitioner

## 2015-07-06 ENCOUNTER — Other Ambulatory Visit
Admission: RE | Admit: 2015-07-06 | Discharge: 2015-07-06 | Disposition: A | Discharge status: Home / Self Care | Payer: 59 | Source: Ambulatory Visit | Attending: Internal Medicine | Admitting: Internal Medicine

## 2015-07-06 DIAGNOSIS — E119 Type 2 diabetes mellitus without complications: Secondary | ICD-10-CM | POA: Diagnosis not present

## 2015-07-06 DIAGNOSIS — Z5181 Encounter for therapeutic drug level monitoring: Secondary | ICD-10-CM | POA: Diagnosis present

## 2015-07-06 LAB — BASIC METABOLIC PANEL
ANION GAP: 7 (ref 5–15)
BUN: 13 mg/dL (ref 6–20)
CHLORIDE: 103 mmol/L (ref 101–111)
CO2: 26 mmol/L (ref 22–32)
Calcium: 9.1 mg/dL (ref 8.9–10.3)
Creatinine, Ser: 0.86 mg/dL (ref 0.44–1.00)
Glucose, Bld: 178 mg/dL — ABNORMAL HIGH (ref 65–99)
POTASSIUM: 3.7 mmol/L (ref 3.5–5.1)
SODIUM: 136 mmol/L (ref 135–145)

## 2015-07-06 LAB — HEMOGLOBIN A1C: HEMOGLOBIN A1C: 7.1 % — AB (ref 4.0–6.0)

## 2015-07-06 LAB — TSH: TSH: 1.344 u[IU]/mL (ref 0.350–4.500)

## 2015-07-07 LAB — T4: T4, Total: 8.8 ug/dL (ref 4.5–12.0)

## 2015-07-07 LAB — T3: T3 TOTAL: 108 ng/dL (ref 71–180)

## 2015-07-19 ENCOUNTER — Other Ambulatory Visit
Admission: RE | Admit: 2015-07-19 | Discharge: 2015-07-19 | Disposition: A | Discharge status: Home / Self Care | Payer: 59 | Source: Ambulatory Visit | Attending: Internal Medicine | Admitting: Internal Medicine

## 2015-07-19 DIAGNOSIS — L659 Nonscarring hair loss, unspecified: Secondary | ICD-10-CM | POA: Insufficient documentation

## 2015-07-19 LAB — VITAMIN B12: Vitamin B-12: 571 pg/mL (ref 180–914)

## 2015-07-19 LAB — HCG, QUANTITATIVE, PREGNANCY: hCG, Beta Chain, Quant, S: <1 m[IU]/mL (ref <5)

## 2015-07-21 LAB — VITAMIN B6: Vitamin B6: 11.7 ug/L (ref 2.0–32.8)

## 2015-07-23 LAB — VITAMIN D 1,25 DIHYDROXY
VITAMIN D2 1, 25 (OH): 47 pg/mL
VITAMIN D3 1, 25 (OH): 13 pg/mL
Vitamin D 1, 25 (OH)2 Total: 60 pg/mL

## 2015-08-08 ENCOUNTER — Other Ambulatory Visit: Payer: Self-pay

## 2015-08-08 NOTE — Patient Outreach (Signed)
[facility]) Care Management  08/08/2015  Julie Hansen [birth year] [id number]   Patient left 2 phone messages and 1 e-mail to rescheduled missed appointment.    Gentry Fitz, RN, BA, [place], [place] Direct Dial:  [phone]  Fax:  [phone] E-mail: [email or web address] [address], [place], [place]  [address]

## 2015-08-30 ENCOUNTER — Other Ambulatory Visit: Payer: Self-pay

## 2015-08-30 NOTE — Patient Outreach (Signed)
[facility]) Care Management  08/30/2015  Julie Hansen 1970/11/11 [id number]  Sent an e-mail message to the patient inquiring whether she can come on December 19th or 28th for follow up.   Gentry Fitz, RN, BA, [place], [place] Direct Dial:  [phone]  Fax:  [phone] E-mail: [email or web address] [address] [place], [place]  [address]

## 2015-09-12 ENCOUNTER — Other Ambulatory Visit: Payer: Self-pay

## 2015-09-12 VITALS — BP 132/78 | Ht 64.0 in | Wt 197.6 lb

## 2015-09-12 DIAGNOSIS — E119 Type 2 diabetes mellitus without complications: Secondary | ICD-10-CM

## 2015-09-12 NOTE — Patient Outreach (Signed)
[facility]) Care Management  [place]  09/12/2015   Julie Hansen 08-12-1971 [id number]  Subjective: Patient arrives for her Link to Wellness visit.  She does not have blood sugars and admits to only checking when she feels funny and she has found this tends to be when she's low.  This is happening about once every two weeks.  She admits to not taking her Phenteremine over the past week but has lost 6lbs since she started taking it. She is skipping meals, drinking sweetened beverages and occasionally forgets to take her evening dose of Amaryl.  Objective:  Filed Vitals:   09/12/15 1037  BP: 132/78  Height: 1.626 m (5\' 4" )  Weight: 197 lb 9.6 oz (89.631 kg)     Current Medications:  Current Outpatient Prescriptions  Medication Sig Dispense Refill  . albuterol (PROVENTIL) (2.5 MG/3ML) 0.083% nebulizer solution Take 2.5 mg by nebulization every 6 (six) hours as needed.    . ALPRAZolam (XANAX) 1 MG tablet Take 1 mg by mouth 2 (two) times daily as needed for anxiety.     Marland Kitchen atorvastatin (LIPITOR) 10 MG tablet Take 10 mg by mouth daily.    . beclomethasone (QVAR) 40 MCG/ACT inhaler Inhale 2 puffs into the lungs 2 (two) times daily.    . canagliflozin (INVOKANA) 300 MG TABS tablet Take 300 mg by mouth daily before breakfast.    . glimepiride (AMARYL) 4 MG tablet Take 4 mg by mouth daily with breakfast.    . lansoprazole (PREVACID) 30 MG capsule Take 30 mg by mouth 2 (two) times daily before a meal.     . lisinopril (PRINIVIL,ZESTRIL) 5 MG tablet Take 5 mg by mouth daily.    Marland Kitchen lurasidone (LATUDA) 40 MG TABS tablet Take 40 mg by mouth daily with breakfast.    . metFORMIN (GLUCOPHAGE) 1000 MG tablet Take 500 mg by mouth daily with breakfast.     . phentermine 37.5 MG capsule Take 37.5 mg by mouth every morning.    . ranitidine (ZANTAC) 300 MG tablet Take 300 mg by mouth at bedtime.    Marland Kitchen zolpidem (AMBIEN) 10 MG tablet Take 10 mg by mouth at bedtime as  needed.    . cephALEXin (KEFLEX) 500 MG capsule Take 1 capsule (500 mg total) by mouth 4 (four) times daily. (Patient not taking: Reported on 03/29/2015) 28 capsule 0  . fluconazole (DIFLUCAN) 150 MG tablet Take 1 tablet (150 mg total) by mouth daily. (Patient not taking: Reported on 03/29/2015) 1 tablet 0  . HYDROcodone-acetaminophen (NORCO/VICODIN) 5-325 MG per tablet Take 1-2 tablets every 6 hours as needed for severe pain (Patient not taking: Reported on 09/12/2015) 12 tablet 0  . ranitidine (ZANTAC) 75 MG tablet Take 75 mg by mouth 2 (two) times daily. Reported on 09/12/2015     No current facility-administered medications for this visit.    Functional Status:  In your present state of health, do you have any difficulty performing the following activities: 09/12/2015  Hearing? N  Vision? N  Difficulty concentrating or making decisions? N  Walking or climbing stairs? N  Dressing or bathing? N  Doing errands, shopping? N    Fall/Depression Screening: PHQ 2/9 Scores 09/12/2015 03/29/2015  PHQ - 2 Score 0 2    Assessment: Julie Hansen is not engaged in her diabetes care.  She has poor self esteem related to her weight- I believe she'll be more willing to be engaged if she can see success with her weight.  Plan: Take your phentermine daily.  Do some sort of exercise 1 time a week for 15 minutes (this is a start but she wasn't willing to be pushed for more).  Call MD office to find out the date of your next visit. Call the meter company to get a second meter, one for home, one for work. Follow up with me in 1 month by phone.  Gentry Fitz, RN, BA, [place], [place] Direct Dial:  [phone]  Fax:  [phone] E-mail: [email or web address] [address], [place], [place]  [address]

## 2015-10-17 ENCOUNTER — Other Ambulatory Visit
Admission: RE | Admit: 2015-10-17 | Discharge: 2015-10-17 | Disposition: A | Discharge status: Home / Self Care | Payer: 59 | Source: Ambulatory Visit | Attending: Internal Medicine | Admitting: Internal Medicine

## 2015-10-17 ENCOUNTER — Other Ambulatory Visit: Payer: Self-pay

## 2015-10-17 DIAGNOSIS — E119 Type 2 diabetes mellitus without complications: Secondary | ICD-10-CM | POA: Insufficient documentation

## 2015-10-17 DIAGNOSIS — E559 Vitamin D deficiency, unspecified: Secondary | ICD-10-CM | POA: Insufficient documentation

## 2015-10-17 LAB — HCG, QUANTITATIVE, PREGNANCY: HCG, BETA CHAIN, QUANT, S: 1 m[IU]/mL (ref <5)

## 2015-10-17 NOTE — Patient Outreach (Signed)
[facility]) Care Management  10/17/2015  Julie Hansen 04/07/1971 [id number]  Spoke with [name] today regarding her diabetes in the Link to Wellness program. She has joined the [employer] [facility] diabetes on line support program. She reports being more engaged in her healthcare and checks her blood sugars more often when she is able to have reminders on her phone.   Reports blood sugars 59-119mg /dl both fasting and in the evenings.  She reports eating only 1-2 meals a day and  Not keeping snacks with her.  She has noted that she is finding herself irritable often.  She taking her Invokana 300mg /day, Glipimeride 4mg  bid but does not take her Metformin as ordered.   She reports her weight at 195lbs checked this past Sunday.    Goals;  eat 3 meals per day and an hs snack  Keep peanut butter crackers at all times  Eat peanut butter crackers if you plan on skipping a meal  Pack your lunch when you pack your son's  Continue to check your blood sugars twice a day  Bring your meter to your MD appointment next week    Gentry Fitz, RN, [place], [place], [place]:  [phone]  Fax:  [phone] E-mail: [email or web address] [address] [place], [place]  [address]

## 2015-10-18 LAB — VITAMIN D 25 HYDROXY (VIT D DEFICIENCY, FRACTURES): Vit D, 25-Hydroxy: 36 ng/mL (ref 30.0–100.0)

## 2015-10-18 LAB — MICROALBUMIN / CREATININE URINE RATIO
Creatinine, Urine: 60.7 mg/dL
Microalb Creat Ratio: <4.9 mg/g creat (ref 0.0–30.0)

## 2015-10-19 ENCOUNTER — Other Ambulatory Visit: Payer: Self-pay | Admitting: Internal Medicine

## 2015-10-19 DIAGNOSIS — Z1231 Encounter for screening mammogram for malignant neoplasm of breast: Secondary | ICD-10-CM

## 2015-10-20 ENCOUNTER — Other Ambulatory Visit: Payer: Self-pay | Admitting: Internal Medicine

## 2015-10-20 ENCOUNTER — Ambulatory Visit
Admission: RE | Admit: 2015-10-20 | Discharge: 2015-10-20 | Disposition: A | Discharge status: Home / Self Care | Payer: 59 | Source: Ambulatory Visit | Attending: Internal Medicine | Admitting: Internal Medicine

## 2015-10-20 DIAGNOSIS — Z1231 Encounter for screening mammogram for malignant neoplasm of breast: Secondary | ICD-10-CM | POA: Diagnosis not present

## 2015-10-26 DIAGNOSIS — F3341 Major depressive disorder, recurrent, in partial remission: Secondary | ICD-10-CM | POA: Diagnosis not present

## 2015-10-26 DIAGNOSIS — F411 Generalized anxiety disorder: Secondary | ICD-10-CM | POA: Diagnosis not present

## 2015-11-02 DIAGNOSIS — E288 Other ovarian dysfunction: Secondary | ICD-10-CM | POA: Diagnosis not present

## 2015-11-02 DIAGNOSIS — N971 Female infertility of tubal origin: Secondary | ICD-10-CM | POA: Diagnosis not present

## 2015-11-02 DIAGNOSIS — Z3161 Procreative counseling and advice using natural family planning: Secondary | ICD-10-CM | POA: Diagnosis not present

## 2015-11-02 DIAGNOSIS — Z31430 Encounter of female for testing for genetic disease carrier status for procreative management: Secondary | ICD-10-CM | POA: Diagnosis not present

## 2015-11-02 DIAGNOSIS — Z130 Encounter for screening for diseases of the blood and blood-forming organs and certain disorders involving the immune mechanism: Secondary | ICD-10-CM | POA: Diagnosis not present

## 2015-11-02 DIAGNOSIS — Z13228 Encounter for screening for other metabolic disorders: Secondary | ICD-10-CM | POA: Diagnosis not present

## 2015-11-02 DIAGNOSIS — Z319 Encounter for procreative management, unspecified: Secondary | ICD-10-CM | POA: Diagnosis not present

## 2015-11-03 DIAGNOSIS — E119 Type 2 diabetes mellitus without complications: Secondary | ICD-10-CM | POA: Diagnosis not present

## 2015-11-09 DIAGNOSIS — Z319 Encounter for procreative management, unspecified: Secondary | ICD-10-CM | POA: Diagnosis not present

## 2015-11-10 DIAGNOSIS — Z319 Encounter for procreative management, unspecified: Secondary | ICD-10-CM | POA: Diagnosis not present

## 2015-11-10 DIAGNOSIS — Z3141 Encounter for fertility testing: Secondary | ICD-10-CM | POA: Diagnosis not present

## 2015-11-18 DIAGNOSIS — E6609 Other obesity due to excess calories: Secondary | ICD-10-CM | POA: Diagnosis not present

## 2015-11-18 DIAGNOSIS — E119 Type 2 diabetes mellitus without complications: Secondary | ICD-10-CM | POA: Diagnosis not present

## 2015-11-18 DIAGNOSIS — E559 Vitamin D deficiency, unspecified: Secondary | ICD-10-CM | POA: Diagnosis not present

## 2015-11-18 DIAGNOSIS — E782 Mixed hyperlipidemia: Secondary | ICD-10-CM | POA: Diagnosis not present

## 2015-12-10 DIAGNOSIS — G4733 Obstructive sleep apnea (adult) (pediatric): Secondary | ICD-10-CM | POA: Diagnosis not present

## 2015-12-14 DIAGNOSIS — Z3141 Encounter for fertility testing: Secondary | ICD-10-CM | POA: Diagnosis not present

## 2015-12-14 DIAGNOSIS — Z319 Encounter for procreative management, unspecified: Secondary | ICD-10-CM | POA: Diagnosis not present

## 2015-12-14 DIAGNOSIS — N971 Female infertility of tubal origin: Secondary | ICD-10-CM | POA: Diagnosis not present

## 2015-12-27 ENCOUNTER — Other Ambulatory Visit: Payer: Self-pay

## 2015-12-27 DIAGNOSIS — E119 Type 2 diabetes mellitus without complications: Secondary | ICD-10-CM

## 2015-12-27 NOTE — Patient Outreach (Signed)
[facility]) Care Management  12/27/2015  Julie Hansen 04-Dec-1970 [id number]   Spoke to [facility] via phone.  She reports that she is still engaged with [place] interactive glucose program through [facility].   She reports that she is not having any hypoglycemia, has more of an appetite since she started Phentermine (only been taking for 3 days since she saw Dr. Gabriel Carina) and continues to take 4mg  Amaryl although Dr. Gabriel Carina asked her to decrease it to 2mg .  She reports not taking her Metformin ("I wasn't taking it when I had my A1C drawn).  Reports blood sugars 130's fasting and when she arrives home. (historically blood sugars after work have been 70-80mg /dl and she cannot pinpoint why they have been more elevated)  She is going to document her menstral cycle as this may correlate to the higher numbers (she is not under more stress than usual and is "getting more sleep than I had in the past").  Julie Hansen has had her mammogram and will report to me once she sees her MD again.   Gentry Fitz, RN, BA, [place], [place] Direct Dial:  [phone]  Fax:  [phone] E-mail: [email or web address] [address] [place], [place]  [address]

## 2016-01-24 DIAGNOSIS — F411 Generalized anxiety disorder: Secondary | ICD-10-CM | POA: Diagnosis not present

## 2016-01-24 DIAGNOSIS — F3341 Major depressive disorder, recurrent, in partial remission: Secondary | ICD-10-CM | POA: Diagnosis not present

## 2016-03-01 DIAGNOSIS — E559 Vitamin D deficiency, unspecified: Secondary | ICD-10-CM | POA: Diagnosis not present

## 2016-03-01 DIAGNOSIS — I10 Essential (primary) hypertension: Secondary | ICD-10-CM | POA: Diagnosis not present

## 2016-03-01 DIAGNOSIS — G4733 Obstructive sleep apnea (adult) (pediatric): Secondary | ICD-10-CM | POA: Diagnosis not present

## 2016-03-01 DIAGNOSIS — E782 Mixed hyperlipidemia: Secondary | ICD-10-CM | POA: Diagnosis not present

## 2016-03-01 DIAGNOSIS — K219 Gastro-esophageal reflux disease without esophagitis: Secondary | ICD-10-CM | POA: Diagnosis not present

## 2016-04-02 DIAGNOSIS — F411 Generalized anxiety disorder: Secondary | ICD-10-CM | POA: Diagnosis not present

## 2016-04-02 DIAGNOSIS — F3341 Major depressive disorder, recurrent, in partial remission: Secondary | ICD-10-CM | POA: Diagnosis not present

## 2016-04-04 ENCOUNTER — Other Ambulatory Visit
Admission: RE | Admit: 2016-04-04 | Discharge: 2016-04-04 | Disposition: A | Discharge status: Home / Self Care | Payer: 59 | Source: Ambulatory Visit | Attending: Internal Medicine | Admitting: Internal Medicine

## 2016-04-04 DIAGNOSIS — E119 Type 2 diabetes mellitus without complications: Secondary | ICD-10-CM | POA: Diagnosis not present

## 2016-04-04 LAB — BASIC METABOLIC PANEL
ANION GAP: 7 (ref 5–15)
BUN: 11 mg/dL (ref 6–20)
CHLORIDE: 107 mmol/L (ref 101–111)
CO2: 23 mmol/L (ref 22–32)
Calcium: 9 mg/dL (ref 8.9–10.3)
Creatinine, Ser: 0.99 mg/dL (ref 0.44–1.00)
GFR calc non Af Amer: >60 mL/min (ref >60)
Glucose, Bld: 208 mg/dL — ABNORMAL HIGH (ref 65–99)
Potassium: 3.7 mmol/L (ref 3.5–5.1)
Sodium: 137 mmol/L (ref 135–145)

## 2016-04-04 LAB — LIPID PANEL
CHOLESTEROL: 194 mg/dL (ref 0–200)
HDL: 57 mg/dL (ref >40)
LDL Cholesterol: 102 mg/dL — ABNORMAL HIGH (ref 0–99)
Total CHOL/HDL Ratio: 3.4 RATIO
Triglycerides: 173 mg/dL — ABNORMAL HIGH (ref <150)
VLDL: 35 mg/dL (ref 0–40)

## 2016-04-04 LAB — HEMOGLOBIN A1C: HEMOGLOBIN A1C: 7.1 % — AB (ref 4.0–6.0)

## 2016-04-11 DIAGNOSIS — E782 Mixed hyperlipidemia: Secondary | ICD-10-CM | POA: Diagnosis not present

## 2016-04-11 DIAGNOSIS — E559 Vitamin D deficiency, unspecified: Secondary | ICD-10-CM | POA: Diagnosis not present

## 2016-04-11 DIAGNOSIS — E119 Type 2 diabetes mellitus without complications: Secondary | ICD-10-CM | POA: Diagnosis not present

## 2016-04-23 DIAGNOSIS — G4733 Obstructive sleep apnea (adult) (pediatric): Secondary | ICD-10-CM | POA: Diagnosis not present

## 2016-05-08 DIAGNOSIS — F411 Generalized anxiety disorder: Secondary | ICD-10-CM | POA: Diagnosis not present

## 2016-05-08 DIAGNOSIS — F3341 Major depressive disorder, recurrent, in partial remission: Secondary | ICD-10-CM | POA: Diagnosis not present

## 2016-05-16 ENCOUNTER — Other Ambulatory Visit: Payer: Self-pay

## 2016-05-16 VITALS — BP 120/84 | Ht 64.0 in | Wt 199.5 lb

## 2016-05-16 DIAGNOSIS — E119 Type 2 diabetes mellitus without complications: Secondary | ICD-10-CM

## 2016-05-16 NOTE — Patient Outreach (Signed)
[facility]) Care Management  [place]  05/16/2016   Marwah Disbro March 19, 1971 [id number]  Subjective: Met with patient in the Link to Wellness Diabetes program.  Akemi reports being under great stress at work and is looking at going back to school.  She is seeing a therapist who is helping her with some life issues. Vielka continues to eat sporadically, skip meals and snack, she is not checking blood sugars and she is not exercising.  She had no blood sugars to report. She is taking her diabetes medications as ordered. She complains about poor sleep.   Objective:  Vitals:   05/16/16 1206  BP: 120/84  Weight: 199 lb 8 oz (90.5 kg)  Height: 1.626 m ('5\' 4"' )     Encounter Medications:  Outpatient Encounter Prescriptions as of 05/16/2016  Medication Sig  . albuterol (PROVENTIL) (2.5 MG/3ML) 0.083% nebulizer solution Take 2.5 mg by nebulization every 6 (six) hours as needed.  . ALPRAZolam (XANAX) 1 MG tablet Take 1 mg by mouth 2 (two) times daily as needed for anxiety.   Marland Kitchen atorvastatin (LIPITOR) 10 MG tablet Take 10 mg by mouth daily.  . beclomethasone (QVAR) 40 MCG/ACT inhaler Inhale 2 puffs into the lungs 2 (two) times daily.  . canagliflozin (INVOKANA) 300 MG TABS tablet Take 300 mg by mouth daily before breakfast.  . glimepiride (AMARYL) 4 MG tablet Take 2 mg by mouth 2 (two) times daily.   . lansoprazole (PREVACID) 30 MG capsule Take 30 mg by mouth 2 (two) times daily before a meal.   . lisinopril (PRINIVIL,ZESTRIL) 5 MG tablet Take 5 mg by mouth daily.  . metFORMIN (GLUCOPHAGE) 1000 MG tablet Take 500 mg by mouth daily with breakfast. Reported on 12/27/2015  . ranitidine (ZANTAC) 300 MG tablet Take 300 mg by mouth at bedtime.  . ziprasidone (GEODON) 80 MG capsule Take 80 mg by mouth 2 (two) times daily with a meal.  . cephALEXin (KEFLEX) 500 MG capsule Take 1 capsule (500 mg total) by mouth 4 (four) times daily. (Patient not taking: Reported on  03/29/2015)  . fluconazole (DIFLUCAN) 150 MG tablet Take 1 tablet (150 mg total) by mouth daily. (Patient not taking: Reported on 03/29/2015)  . HYDROcodone-acetaminophen (NORCO/VICODIN) 5-325 MG per tablet Take 1-2 tablets every 6 hours as needed for severe pain (Patient not taking: Reported on 09/12/2015)  . lurasidone (LATUDA) 40 MG TABS tablet Take 40 mg by mouth daily with breakfast.  . phentermine 37.5 MG capsule Take 37.5 mg by mouth every morning.  . ranitidine (ZANTAC) 75 MG tablet Take 75 mg by mouth 2 (two) times daily. Reported on 10/17/2015  . zolpidem (AMBIEN) 10 MG tablet Take 10 mg by mouth at bedtime as needed. Reported on 10/17/2015   No facility-administered encounter medications on file as of 05/16/2016.     Functional Status:  In your present state of health, do you have any difficulty performing the following activities: 05/16/2016 09/12/2015  Hearing? N N  Vision? N N  Difficulty concentrating or making decisions? N N  Walking or climbing stairs? N N  Dressing or bathing? N N  Doing errands, shopping? N N  Some recent data might be hidden    Fall/Depression Screening: PHQ 2/9 Scores 05/16/2016 09/12/2015 03/29/2015  PHQ - 2 Score 0 0 2    Assessment: A1C nearly at goal but has not engaged in any of the healthy behavior she discussed with Dr. Gabriel Carina in July. Her goal is to lose weight  so exercise and diet management will aid in this- goals set to ;  Walk 1x/week for 15 minutes, eat at least 2 meals per day- pack a meal when she makes her sons and keep food in her office so she avoids going without.  She needs a dilated eye exam.   Plan:  [facility] CM Care Plan Problem One   Flowsheet Row Most Recent Value  Care Plan Problem One  Patient complains of being overweight  Role Documenting the Problem One  Care Management [place] for Problem One  Active  [facility] Long Term Goal (31-90 days)  Patient will decrease weight to less than 197.6lbs at next MD visit  [place] Start Date  05/16/16  Interventions for Problem One Long Term Goal  Discussed the non-pharmaceutical changes she could make to decrease weight, eliminate regualr sodas/sweet tea and the benefits of exercise. Discussed potential complications of poor health habits.  Discussed the benefit of exercise in aid in sleep.  Discussed time management, the opportunity to make meals and snacks when she is preparing it for her son.  Discussed the benefit of social support and accountability to herself to manage her health.     [facility] CM Care Plan Problem Two   Flowsheet Row Most Recent Value  Care Plan Problem Two  Potential for hypoglycemia  Role Documenting the Problem Two  Care Management Coordinator  Care Plan for Problem Two  Not Active  [facility] Long Term Goal (31-90) days  patient will avoid hypoglycemia  [facility] Long Term Goal Start Date  10/17/15  [facility] Long Term Goal Met Date  05/16/16      Gentry Fitz, RN, BA, [place], [place] Diabetes Coordinator- Link To Wellness Direct Dial:  [phone]  Fax:  [phone] E-mail: [email or web address] [address], [place], [place]  [address]

## 2016-06-11 ENCOUNTER — Other Ambulatory Visit: Payer: Self-pay

## 2016-06-11 NOTE — Patient Outreach (Signed)
[facility]) Care Management  [place]  06/11/2016   Julie Hansen [birth year] [id number]  Subjective: Spoke to patient by phone today regarding her diabetes as part of the Link to Wellness diabetes program. She told me when I me with her, she wanted to lose weight (goal weight 197. 6 lbs by her next visit with Dr. Gabriel Carina.  She set goals; walk 1x/week, eat at least 2 meals per day.    Encounter Medications:  Outpatient Encounter Prescriptions as of 06/11/2016  Medication Sig  . albuterol (PROVENTIL) (2.5 MG/3ML) 0.083% nebulizer solution Take 2.5 mg by nebulization every 6 (six) hours as needed.  . ALPRAZolam (XANAX) 1 MG tablet Take 1 mg by mouth 2 (two) times daily as needed for anxiety.   Marland Kitchen atorvastatin (LIPITOR) 10 MG tablet Take 10 mg by mouth daily.  . beclomethasone (QVAR) 40 MCG/ACT inhaler Inhale 2 puffs into the lungs 2 (two) times daily.  . canagliflozin (INVOKANA) 300 MG TABS tablet Take 300 mg by mouth daily before breakfast.  . cephALEXin (KEFLEX) 500 MG capsule Take 1 capsule (500 mg total) by mouth 4 (four) times daily. (Patient not taking: Reported on 03/29/2015)  . fluconazole (DIFLUCAN) 150 MG tablet Take 1 tablet (150 mg total) by mouth daily. (Patient not taking: Reported on 03/29/2015)  . glimepiride (AMARYL) 4 MG tablet Take 2 mg by mouth 2 (two) times daily.   Marland Kitchen HYDROcodone-acetaminophen (NORCO/VICODIN) 5-325 MG per tablet Take 1-2 tablets every 6 hours as needed for severe pain (Patient not taking: Reported on 09/12/2015)  . lansoprazole (PREVACID) 30 MG capsule Take 30 mg by mouth 2 (two) times daily before a meal.   . lisinopril (PRINIVIL,ZESTRIL) 5 MG tablet Take 5 mg by mouth daily.  Marland Kitchen lurasidone (LATUDA) 40 MG TABS tablet Take 40 mg by mouth daily with breakfast.  . metFORMIN (GLUCOPHAGE) 1000 MG tablet Take 500 mg by mouth daily with breakfast. Reported on 12/27/2015  . phentermine 37.5 MG capsule Take 37.5 mg by mouth every  morning.  . ranitidine (ZANTAC) 300 MG tablet Take 300 mg by mouth at bedtime.  . ranitidine (ZANTAC) 75 MG tablet Take 75 mg by mouth 2 (two) times daily. Reported on 10/17/2015  . ziprasidone (GEODON) 80 MG capsule Take 80 mg by mouth 2 (two) times daily with a meal.  . zolpidem (AMBIEN) 10 MG tablet Take 10 mg by mouth at bedtime as needed. Reported on 10/17/2015   No facility-administered encounter medications on file as of 06/11/2016.     Functional Status:  In your present state of health, do you have any difficulty performing the following activities: 05/16/2016 09/12/2015  Hearing? N N  Vision? N N  Difficulty concentrating or making decisions? N N  Walking or climbing stairs? N N  Dressing or bathing? N N  Doing errands, shopping? N N  Some recent data might be hidden    Fall/Depression Screening: PHQ 2/9 Scores 05/16/2016 09/12/2015 03/29/2015  PHQ - 2 Score 0 0 2    Assessment: Patient reports fasting blood sugars 95-115mg /dl in the morning and 2 hour post prandial blood sugars 150-160mg /dl.  She has only exercised a couple of times since I saw her and does not have a definitive plan to exercise each week.  She is parking further away from the hospital and is "using the courtesy car less".  She is still drinking sweetened beverages but tells me she has decreased her consumption.   Plan: If Julie Hansen wants to accomplish  her goal of decreased weight, she needs to create a weekly plan for exercise, perhaps engaging a partner to make her more accountable. We discussed the need to weight training as well- this will lead to increased muscle mass and will aid in blood sugar control. Counseled on the need to get rid any ANY concentrated beverages from her diet.  I will follow up with her in 1 month.  Gentry Fitz, RN, BA, MHA, CDE Diabetes Coordinator Inpatient Diabetes Program  [phone] (Team Pager) [phone] ([place]) 06/11/2016 11:04 AM

## 2016-07-18 ENCOUNTER — Other Ambulatory Visit: Payer: Self-pay

## 2016-07-18 ENCOUNTER — Ambulatory Visit: Payer: 59

## 2016-07-18 NOTE — Patient Outreach (Signed)
[facility]) Care Management  07/18/2016  Calder Ramlall [birth year] [id number]   Spoke to patient by phone for the [facility] Well[facility] diabetes program.  Yovonda is checking blood sugars every morning reporting blood sugars 101-156mg /dl.  She denies pain, falls, depression, hypo or hyperglycemia.  She is not exercising. Her last A1C was 7.1%- she is taking her medications as ordered.   She has been encouraged to log onto the [employer], the new platform for [employer] [facility] for 2018.   Current Outpatient Prescriptions on File Prior to Visit  Medication Sig Dispense Refill  . albuterol (PROVENTIL) (2.5 MG/3ML) 0.083% nebulizer solution Take 2.5 mg by nebulization every 6 (six) hours as needed.    . ALPRAZolam (XANAX) 1 MG tablet Take 1 mg by mouth 2 (two) times daily as needed for anxiety.     Marland Kitchen atorvastatin (LIPITOR) 10 MG tablet Take 10 mg by mouth daily.    . beclomethasone (QVAR) 40 MCG/ACT inhaler Inhale 2 puffs into the lungs 2 (two) times daily. prn    . canagliflozin (INVOKANA) 300 MG TABS tablet Take 300 mg by mouth daily before breakfast.    . glimepiride (AMARYL) 4 MG tablet Take 2 mg by mouth 2 (two) times daily.     . lansoprazole (PREVACID) 30 MG capsule Take 30 mg by mouth 2 (two) times daily before a meal.     . lisinopril (PRINIVIL,ZESTRIL) 5 MG tablet Take 5 mg by mouth daily.    . metFORMIN (GLUCOPHAGE) 1000 MG tablet Take 500 mg by mouth daily with breakfast. Reported on 12/27/2015    . ranitidine (ZANTAC) 300 MG tablet Take 300 mg by mouth at bedtime.    Marland Kitchen zolpidem (AMBIEN) 10 MG tablet Take 10 mg by mouth at bedtime as needed. Reported on 10/17/2015    . cephALEXin (KEFLEX) 500 MG capsule Take 1 capsule (500 mg total) by mouth 4 (four) times daily. (Patient not taking: Reported on 07/18/2016) 28 capsule 0  . fluconazole (DIFLUCAN) 150 MG tablet Take 1 tablet (150 mg total) by mouth daily. (Patient not taking: Reported on 07/18/2016) 1 tablet 0  .  HYDROcodone-acetaminophen (NORCO/VICODIN) 5-325 MG per tablet Take 1-2 tablets every 6 hours as needed for severe pain (Patient not taking: Reported on 07/18/2016) 12 tablet 0  . lurasidone (LATUDA) 40 MG TABS tablet Take 40 mg by mouth daily with breakfast.    . phentermine 37.5 MG capsule Take 37.5 mg by mouth every morning.    . ranitidine (ZANTAC) 75 MG tablet Take 75 mg by mouth 2 (two) times daily. Reported on 10/17/2015    . ziprasidone (GEODON) 80 MG capsule Take 80 mg by mouth 2 (two) times daily with a meal.     No current facility-administered medications on file prior to visit.    [facility] CM Care Plan Problem One   Flowsheet Row Most Recent Value  Care Plan Problem One  Patient complains of being overweight  Role Documenting the Problem One  Care Management Coordinator  Care Plan for Problem One  Not Active    [facility] CM Care Plan Problem Two   Flowsheet Row Most Recent Value  Care Plan Problem Two  Potential for elevated A1C >7.1%  Role Documenting the Problem Two  Care Management Coordinator  Care Plan for Problem Two  Active  Interventions for Problem Two Long Term Goal   discussed the importance of CBG 2x/day 2. encouraged sign up for [employer] 3. encouraged exercise 2-3 times per  week 4. praised patient for taking meds as ordered.   [facility] Long Term Goal (31-90) days  Patient will maintain an A1C = or < 7.1% when rechecked  by MD at next visit  [place] Start Date  07/18/16      She was advised to continue current medications for diabetes. Encouraged to check  blood sugars twice daily.   Gentry Fitz, RN, BA, [place], [place] Direct Dial:  [phone]  Fax:  [phone] E-mail: [email or web address] [address] [place],   [address]

## 2016-07-19 DIAGNOSIS — G4733 Obstructive sleep apnea (adult) (pediatric): Secondary | ICD-10-CM | POA: Diagnosis not present

## 2016-07-23 ENCOUNTER — Other Ambulatory Visit
Admission: RE | Admit: 2016-07-23 | Discharge: 2016-07-23 | Disposition: A | Discharge status: Home / Self Care | Payer: 59 | Source: Ambulatory Visit | Attending: Internal Medicine | Admitting: Internal Medicine

## 2016-07-23 DIAGNOSIS — E119 Type 2 diabetes mellitus without complications: Secondary | ICD-10-CM | POA: Insufficient documentation

## 2016-07-23 DIAGNOSIS — E559 Vitamin D deficiency, unspecified: Secondary | ICD-10-CM | POA: Diagnosis not present

## 2016-07-24 LAB — HEMOGLOBIN A1C
HEMOGLOBIN A1C: 8.7 % — AB (ref 4.8–5.6)
Mean Plasma Glucose: 203 mg/dL

## 2016-07-24 LAB — VITAMIN D 25 HYDROXY (VIT D DEFICIENCY, FRACTURES): Vit D, 25-Hydroxy: 18.9 ng/mL — ABNORMAL LOW (ref 30.0–100.0)

## 2016-07-30 ENCOUNTER — Other Ambulatory Visit: Payer: Self-pay | Admitting: Obstetrics

## 2016-07-31 DIAGNOSIS — R350 Frequency of micturition: Secondary | ICD-10-CM | POA: Diagnosis not present

## 2016-07-31 DIAGNOSIS — E1165 Type 2 diabetes mellitus with hyperglycemia: Secondary | ICD-10-CM | POA: Diagnosis not present

## 2016-07-31 DIAGNOSIS — E559 Vitamin D deficiency, unspecified: Secondary | ICD-10-CM | POA: Diagnosis not present

## 2016-08-01 DIAGNOSIS — N63 Unspecified lump in unspecified breast: Secondary | ICD-10-CM | POA: Diagnosis not present

## 2016-08-01 DIAGNOSIS — F3341 Major depressive disorder, recurrent, in partial remission: Secondary | ICD-10-CM | POA: Diagnosis not present

## 2016-08-01 DIAGNOSIS — F411 Generalized anxiety disorder: Secondary | ICD-10-CM | POA: Diagnosis not present

## 2016-08-07 ENCOUNTER — Other Ambulatory Visit: Payer: Self-pay | Admitting: Obstetrics

## 2016-08-07 DIAGNOSIS — R2231 Localized swelling, mass and lump, right upper limb: Secondary | ICD-10-CM

## 2016-08-13 DIAGNOSIS — B373 Candidiasis of vulva and vagina: Secondary | ICD-10-CM | POA: Diagnosis not present

## 2016-08-13 DIAGNOSIS — Z01419 Encounter for gynecological examination (general) (routine) without abnormal findings: Secondary | ICD-10-CM | POA: Diagnosis not present

## 2016-08-14 ENCOUNTER — Other Ambulatory Visit: Payer: Self-pay | Admitting: Obstetrics

## 2016-08-14 ENCOUNTER — Ambulatory Visit
Admission: RE | Admit: 2016-08-14 | Discharge: 2016-08-14 | Disposition: A | Discharge status: Home / Self Care | Payer: 59 | Source: Ambulatory Visit | Attending: Obstetrics | Admitting: Obstetrics

## 2016-08-14 DIAGNOSIS — R2231 Localized swelling, mass and lump, right upper limb: Secondary | ICD-10-CM

## 2016-08-14 DIAGNOSIS — N6489 Other specified disorders of breast: Secondary | ICD-10-CM | POA: Diagnosis not present

## 2016-08-14 DIAGNOSIS — R928 Other abnormal and inconclusive findings on diagnostic imaging of breast: Secondary | ICD-10-CM | POA: Diagnosis not present

## 2016-08-28 DIAGNOSIS — Z7984 Long term (current) use of oral hypoglycemic drugs: Secondary | ICD-10-CM | POA: Diagnosis not present

## 2016-08-28 DIAGNOSIS — H35373 Puckering of macula, bilateral: Secondary | ICD-10-CM | POA: Diagnosis not present

## 2016-08-28 DIAGNOSIS — H524 Presbyopia: Secondary | ICD-10-CM | POA: Diagnosis not present

## 2016-08-28 DIAGNOSIS — E119 Type 2 diabetes mellitus without complications: Secondary | ICD-10-CM | POA: Diagnosis not present

## 2016-09-04 LAB — MICROALBUMIN / CREATININE URINE RATIO
CREATININE, UR: 99.3 mg/dL
MICROALB UR: 6.1 ug/mL — AB
MICROALB/CREAT RATIO: 6.1 mg/g{creat} (ref 0.0–30.0)

## 2016-09-13 DIAGNOSIS — B373 Candidiasis of vulva and vagina: Secondary | ICD-10-CM | POA: Diagnosis not present

## 2016-09-13 DIAGNOSIS — N760 Acute vaginitis: Secondary | ICD-10-CM | POA: Diagnosis not present

## 2016-09-27 ENCOUNTER — Encounter (INDEPENDENT_AMBULATORY_CARE_PROVIDER_SITE_OTHER): Payer: 59 | Admitting: Ophthalmology

## 2016-09-27 DIAGNOSIS — I10 Essential (primary) hypertension: Secondary | ICD-10-CM | POA: Diagnosis not present

## 2016-09-27 DIAGNOSIS — H35033 Hypertensive retinopathy, bilateral: Secondary | ICD-10-CM | POA: Diagnosis not present

## 2016-09-27 DIAGNOSIS — H43813 Vitreous degeneration, bilateral: Secondary | ICD-10-CM

## 2016-10-04 DIAGNOSIS — B373 Candidiasis of vulva and vagina: Secondary | ICD-10-CM | POA: Diagnosis not present

## 2016-10-04 DIAGNOSIS — N760 Acute vaginitis: Secondary | ICD-10-CM | POA: Diagnosis not present

## 2016-10-18 ENCOUNTER — Other Ambulatory Visit: Payer: Self-pay | Admitting: Internal Medicine

## 2016-10-18 DIAGNOSIS — F411 Generalized anxiety disorder: Secondary | ICD-10-CM | POA: Diagnosis not present

## 2016-10-18 DIAGNOSIS — F3341 Major depressive disorder, recurrent, in partial remission: Secondary | ICD-10-CM | POA: Diagnosis not present

## 2016-10-18 DIAGNOSIS — Z1231 Encounter for screening mammogram for malignant neoplasm of breast: Secondary | ICD-10-CM

## 2016-10-25 ENCOUNTER — Ambulatory Visit
Admission: RE | Admit: 2016-10-25 | Discharge: 2016-10-25 | Disposition: A | Discharge status: Home / Self Care | Payer: 59 | Source: Ambulatory Visit | Attending: Internal Medicine | Admitting: Internal Medicine

## 2016-10-25 DIAGNOSIS — Z1231 Encounter for screening mammogram for malignant neoplasm of breast: Secondary | ICD-10-CM

## 2016-10-25 IMAGING — MG 2D DIGITAL SCREENING BILATERAL MAMMOGRAM WITH CAD AND ADJUNCT TO
8 of 12 series · 8 of 28 positions shown · non-contrast
Comparison: Previous exam(s).

CLINICAL DATA: Screening.

EXAM:
2D DIGITAL SCREENING BILATERAL MAMMOGRAM WITH CAD AND ADJUNCT TOMO

[R MLO]
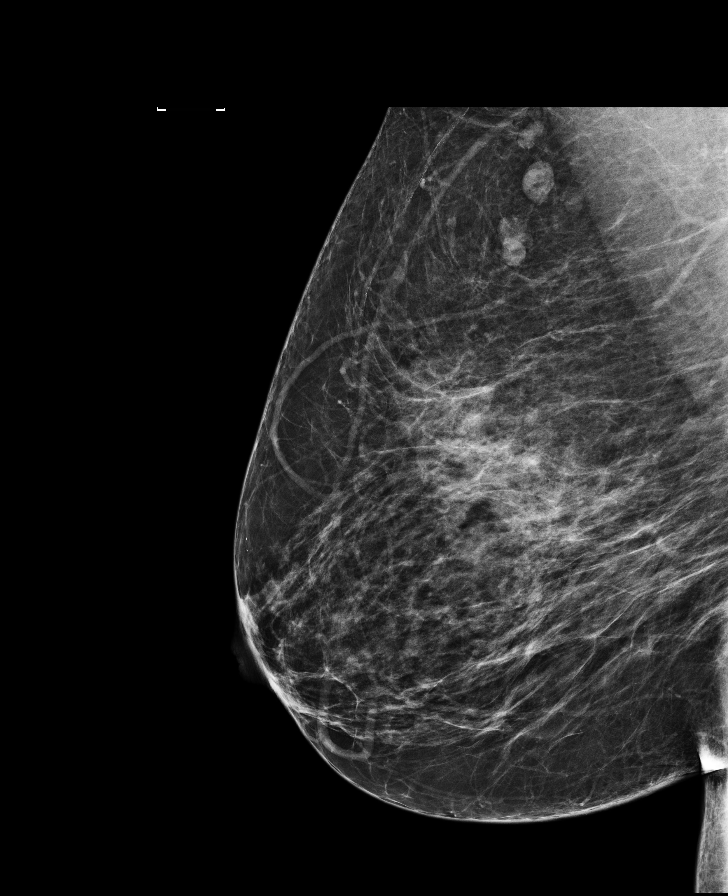

[R CC]
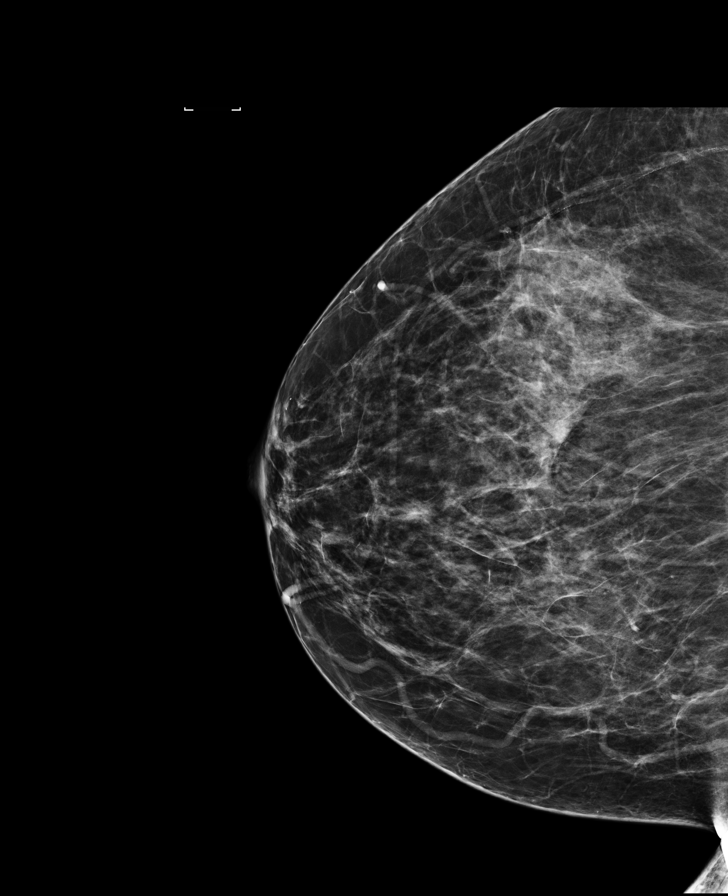

[L MLO]
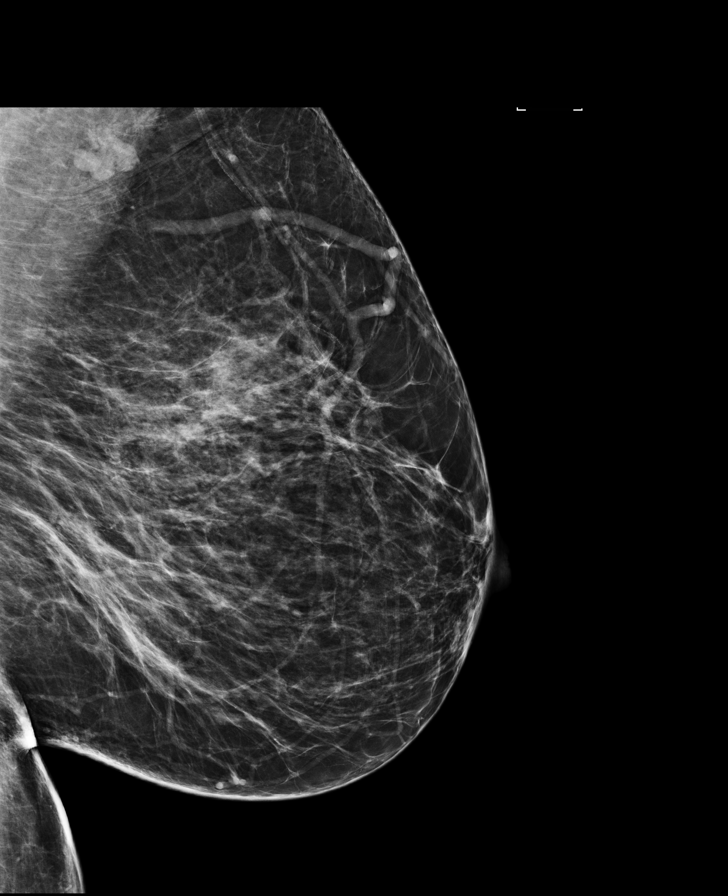

[L CC synth-2D]
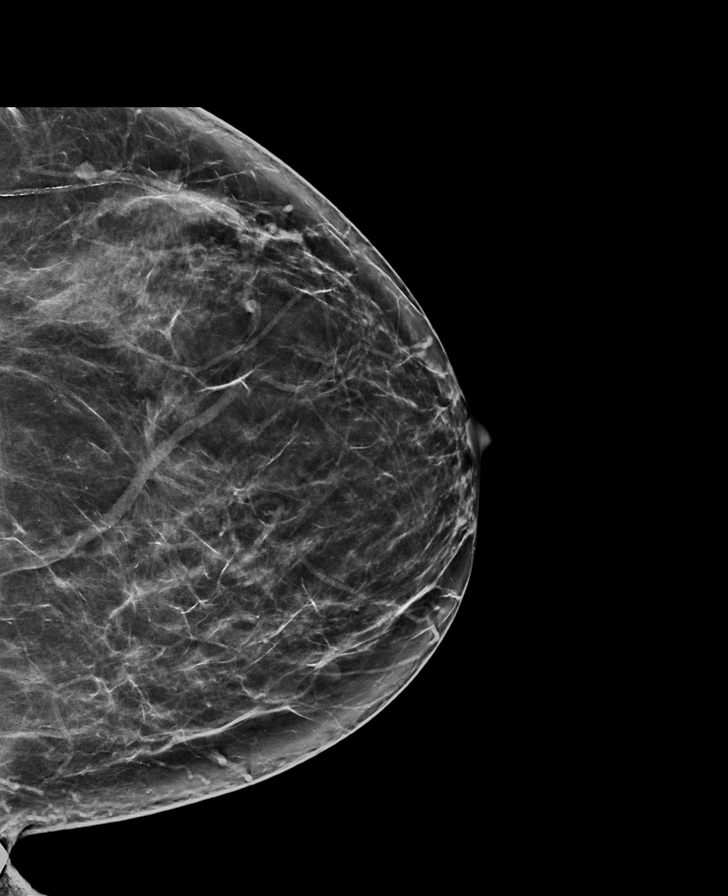

[R MLO synth-2D]
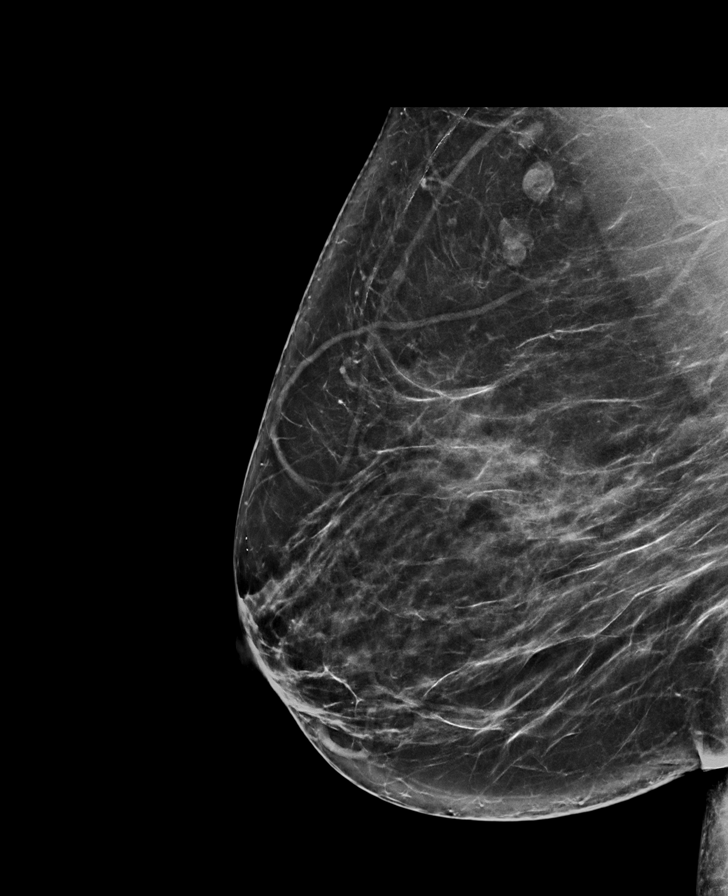

[R CC synth-2D]
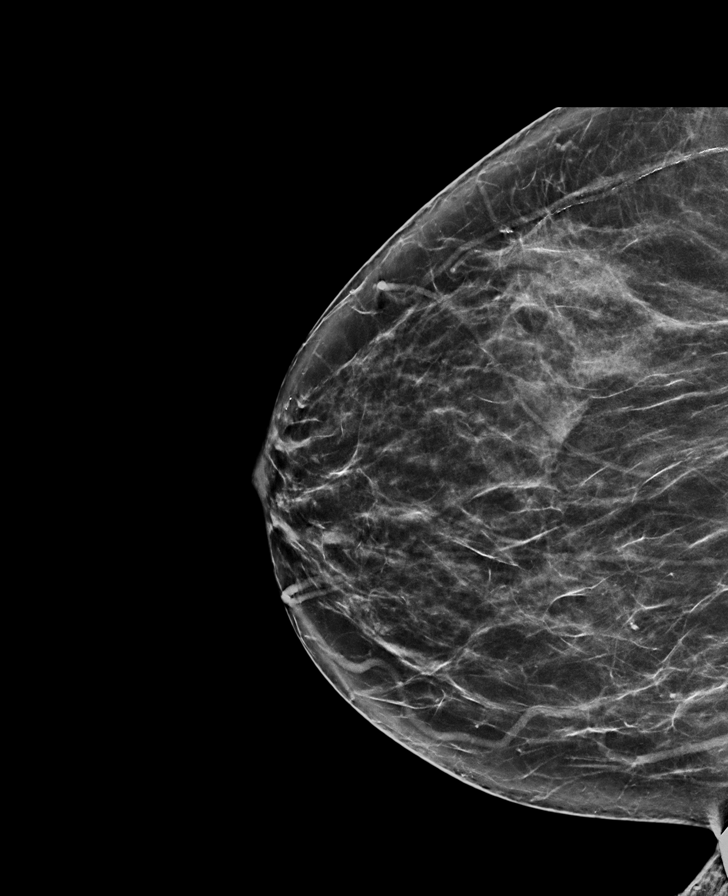

[L MLO synth-2D]
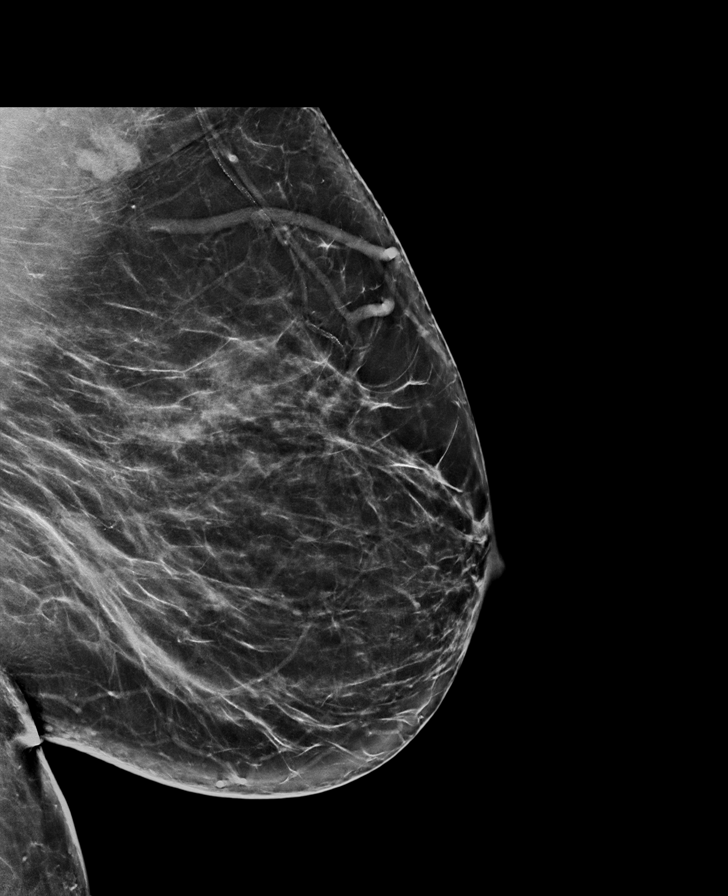

[L CC]
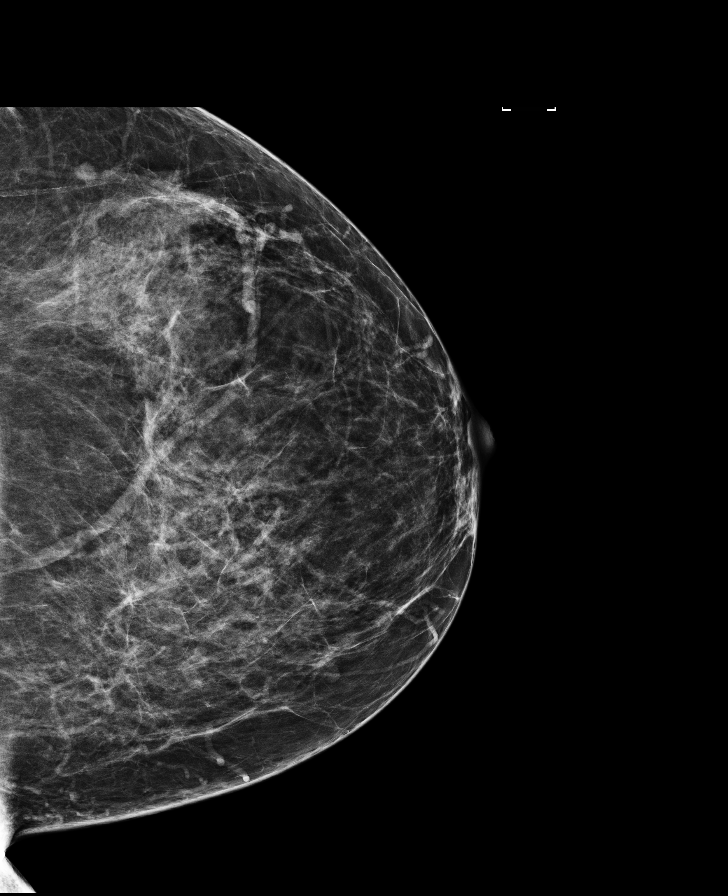

[8 of 28 positions shown; findings below may reference images not displayed]

ACR Breast Density Category b: There are scattered areas of
fibroglandular density.
FINDINGS: There are no findings suspicious for malignancy. Images were
processed with CAD.
IMPRESSION: No mammographic evidence of malignancy. A result letter of this
screening mammogram will be mailed directly to the patient.

RECOMMENDATION:
Screening mammogram in one year. (Code:[33])

BI-RADS CATEGORY  1: Negative.

## 2016-10-26 DIAGNOSIS — G4733 Obstructive sleep apnea (adult) (pediatric): Secondary | ICD-10-CM | POA: Diagnosis not present

## 2016-10-30 ENCOUNTER — Encounter: Payer: Self-pay | Admitting: Physician Assistant

## 2016-10-30 ENCOUNTER — Ambulatory Visit: Payer: Self-pay | Admitting: Physician Assistant

## 2016-10-30 VITALS — BP 140/70 | HR 90 | Temp 98.4°F

## 2016-10-30 DIAGNOSIS — K219 Gastro-esophageal reflux disease without esophagitis: Secondary | ICD-10-CM | POA: Insufficient documentation

## 2016-10-30 DIAGNOSIS — E782 Mixed hyperlipidemia: Secondary | ICD-10-CM | POA: Insufficient documentation

## 2016-10-30 DIAGNOSIS — H6983 Other specified disorders of Eustachian tube, bilateral: Secondary | ICD-10-CM

## 2016-10-30 MED ORDER — PREDNISONE 10 MG PO TABS: 30.0000 mg | ORAL_TABLET | Freq: Every day | ORAL | 0 refills | Status: DC

## 2016-10-30 NOTE — Progress Notes (Signed)
S: c/o 1. dizziness (like she is floating) and sinus pain, is on amoxil, worried bc her throat became itchy last night and tongue is sore, no fever/chills, did have body aches when sx first started over a week ago,2.  worried that she might be allergic to amoxil, no hives or rash, no dif breathing, 3. continues to have headaches, saw her pcp and was started on imitrex and therapist gave her baclofen, denies slurred speech, numbness or tingling  Med see attached, also taking mucinex d and alternating with sudafed  O: vitals wnl, nad, perrl eomi, no nystagmus noted, tms dull b/l, nasal mucosa red/swollen, throat wnl, no swelling or redness, no yeast noted, neck supple no lymph, lungs c t a, cv rrr, neuro intact  A: dysfunction of eustachean tube, dizziness, chronic headaches  P: pred 30mg  qd x 3d, stop decongestants as they have elevated her bp and caused her to have dry mouth, no concerns of allergy to amoxil as she does not have any swelling or rashes

## 2016-11-02 DIAGNOSIS — E538 Deficiency of other specified B group vitamins: Secondary | ICD-10-CM | POA: Diagnosis not present

## 2016-11-02 DIAGNOSIS — E559 Vitamin D deficiency, unspecified: Secondary | ICD-10-CM | POA: Diagnosis not present

## 2016-11-02 DIAGNOSIS — E1165 Type 2 diabetes mellitus with hyperglycemia: Secondary | ICD-10-CM | POA: Diagnosis not present

## 2016-11-09 DIAGNOSIS — E782 Mixed hyperlipidemia: Secondary | ICD-10-CM | POA: Diagnosis not present

## 2016-11-09 DIAGNOSIS — B373 Candidiasis of vulva and vagina: Secondary | ICD-10-CM | POA: Diagnosis not present

## 2016-11-09 DIAGNOSIS — E119 Type 2 diabetes mellitus without complications: Secondary | ICD-10-CM | POA: Diagnosis not present

## 2016-11-12 ENCOUNTER — Ambulatory Visit: Payer: 59

## 2016-11-19 ENCOUNTER — Other Ambulatory Visit
Admission: RE | Admit: 2016-11-19 | Discharge: 2016-11-19 | Disposition: A | Discharge status: Home / Self Care | Payer: 59 | Source: Ambulatory Visit | Attending: Internal Medicine | Admitting: Internal Medicine

## 2016-11-19 DIAGNOSIS — R300 Dysuria: Secondary | ICD-10-CM | POA: Insufficient documentation

## 2016-11-19 DIAGNOSIS — E119 Type 2 diabetes mellitus without complications: Secondary | ICD-10-CM | POA: Diagnosis not present

## 2016-11-19 DIAGNOSIS — E782 Mixed hyperlipidemia: Secondary | ICD-10-CM | POA: Insufficient documentation

## 2016-11-19 DIAGNOSIS — I10 Essential (primary) hypertension: Secondary | ICD-10-CM | POA: Diagnosis not present

## 2016-11-19 LAB — URINALYSIS, COMPLETE (UACMP) WITH MICROSCOPIC
Bilirubin Urine: NEGATIVE
Glucose, UA: >=500 mg/dL — AB
HGB URINE DIPSTICK: NEGATIVE
Ketones, ur: NEGATIVE mg/dL
Leukocytes, UA: NEGATIVE
NITRITE: NEGATIVE
PROTEIN: NEGATIVE mg/dL
SPECIFIC GRAVITY, URINE: 1.025 (ref 1.005–1.030)
pH: 7 (ref 5.0–8.0)

## 2016-11-19 LAB — LIPID PANEL
CHOL/HDL RATIO: 2.7 ratio
CHOLESTEROL: 182 mg/dL (ref 0–200)
HDL: 68 mg/dL (ref >40)
LDL Cholesterol: 92 mg/dL (ref 0–99)
Triglycerides: 111 mg/dL (ref <150)
VLDL: 22 mg/dL (ref 0–40)

## 2016-11-23 ENCOUNTER — Encounter: Payer: Self-pay | Admitting: Physician Assistant

## 2016-11-23 ENCOUNTER — Ambulatory Visit (INDEPENDENT_AMBULATORY_CARE_PROVIDER_SITE_OTHER): Payer: 59 | Admitting: Physician Assistant

## 2016-11-23 VITALS — BP 104/67 | HR 75 | Resp 18 | Ht 63.0 in | Wt 198.0 lb

## 2016-11-23 DIAGNOSIS — IMO0002 Reserved for concepts with insufficient information to code with codable children: Secondary | ICD-10-CM

## 2016-11-23 DIAGNOSIS — G43709 Chronic migraine without aura, not intractable, without status migrainosus: Secondary | ICD-10-CM | POA: Diagnosis not present

## 2016-11-23 DIAGNOSIS — G43009 Migraine without aura, not intractable, without status migrainosus: Secondary | ICD-10-CM

## 2016-11-23 MED ORDER — BACLOFEN 10 MG PO TABS: 10.0000 mg | ORAL_TABLET | Freq: Every day | ORAL | 1 refills | Status: DC | PRN

## 2016-11-23 MED ORDER — METOCLOPRAMIDE HCL 10 MG PO TABS: 10.0000 mg | ORAL_TABLET | Freq: Three times a day (TID) | ORAL | 1 refills | Status: DC | PRN

## 2016-11-23 MED ORDER — RIZATRIPTAN BENZOATE 10 MG PO TABS: 10.0000 mg | ORAL_TABLET | ORAL | 2 refills | Status: DC | PRN

## 2016-11-23 MED ORDER — TOPIRAMATE 25 MG PO TABS: 75.0000 mg | ORAL_TABLET | Freq: Every morning | ORAL | 2 refills | Status: DC

## 2016-11-23 NOTE — Patient Instructions (Signed)
Migraine Headache A migraine headache is an intense, throbbing pain on one side or both sides of the head. Migraines may also cause other symptoms, such as nausea, vomiting, and sensitivity to light and noise. What are the causes? Doing or taking certain things may also trigger migraines, such as:  Alcohol.  Smoking.  Medicines, such as: ? Medicine used to treat chest pain (nitroglycerine). ? Birth control pills. ? Estrogen pills. ? Certain blood pressure medicines.  Aged cheeses, chocolate, or caffeine.  Foods or drinks that contain nitrates, glutamate, aspartame, or tyramine.  Physical activity.  Other things that may trigger a migraine include:  Menstruation.  Pregnancy.  Hunger.  Stress, lack of sleep, too much sleep, or fatigue.  Weather changes.  What increases the risk? The following factors may make you more likely to experience migraine headaches:  Age. Risk increases with age.  Family history of migraine headaches.  Being Caucasian.  Depression and anxiety.  Obesity.  Being a woman.  Having a hole in the heart (patent foramen ovale) or other heart problems.  What are the signs or symptoms? The main symptom of this condition is pulsating or throbbing pain. Pain may:  Happen in any area of the head, such as on one side or both sides.  Interfere with daily activities.  Get worse with physical activity.  Get worse with exposure to bright lights or loud noises.  Other symptoms may include:  Nausea.  Vomiting.  Dizziness.  General sensitivity to bright lights, loud noises, or smells.  Before you get a migraine, you may get warning signs that a migraine is developing (aura). An aura may include:  Seeing flashing lights or having blind spots.  Seeing bright spots, halos, or zigzag lines.  Having tunnel vision or blurred vision.  Having numbness or a tingling feeling.  Having trouble talking.  Having muscle weakness.  How is this  diagnosed? A migraine headache can be diagnosed based on:  Your symptoms.  A physical exam.  Tests, such as CT scan or MRI of the head. These imaging tests can help rule out other causes of headaches.  Taking fluid from the spine (lumbar puncture) and analyzing it (cerebrospinal fluid analysis, or CSF analysis).  How is this treated? A migraine headache is usually treated with medicines that:  Relieve pain.  Relieve nausea.  Prevent migraines from coming back.  Treatment may also include:  Acupuncture.  Lifestyle changes like avoiding foods that trigger migraines.  Follow these instructions at home: Medicines  Take over-the-counter and prescription medicines only as told by your health care provider.  Do not drive or use heavy machinery while taking prescription pain medicine.  To prevent or treat constipation while you are taking prescription pain medicine, your health care provider may recommend that you: ? Drink enough fluid to keep your urine clear or pale yellow. ? Take over-the-counter or prescription medicines. ? Eat foods that are high in fiber, such as fresh fruits and vegetables, whole grains, and beans. ? Limit foods that are high in fat and processed sugars, such as fried and sweet foods. Lifestyle  Avoid alcohol use.  Do not use any products that contain nicotine or tobacco, such as cigarettes and e-cigarettes. If you need help quitting, ask your health care provider.  Get at least 8 hours of sleep every night.  Limit your stress. General instructions   Keep a journal to find out what may trigger your migraine headaches. For example, write down: ? What you eat and   drink. ? How much sleep you get. ? Any change to your diet or medicines.  If you have a migraine: ? Avoid things that make your symptoms worse, such as bright lights. ? It may help to lie down in a dark, quiet room. ? Do not drive or use heavy machinery. ? Ask your health care provider  what activities are safe for you while you are experiencing symptoms.  Keep all follow-up visits as told by your health care provider. This is important. Contact a health care provider if:  You develop symptoms that are different or more severe than your usual migraine symptoms. Get help right away if:  Your migraine becomes severe.  You have a fever.  You have a stiff neck.  You have vision loss.  Your muscles feel weak or like you cannot control them.  You start to lose your balance often.  You develop trouble walking.  You faint. This information is not intended to replace advice given to you by your health care provider. Make sure you discuss any questions you have with your health care provider. Document Released: 09/10/2005 Document Revised: 03/30/2016 Document Reviewed: 02/27/2016 Elsevier Interactive Patient Education  2017 Elsevier Inc.   

## 2016-11-23 NOTE — Progress Notes (Signed)
History:  Julie Hansen is a 46 y.o. G2P2000 who presents to clinic today for new eval of HA.  She noticed them come on 2 years ago.  She has used Flexeril, baclofen, ibuprofen, tylenol and Imitrex.  They worsened over the last year, may be related to hormones.  She has decreased caffeine. The HAs can be severe, begin one sided, sometimes behind the left eye and movees to encompass the entire head and neck area. There is photophobia, phonophobia, nausea and vomiting. Moving makes it worse.  She sees white spots prior to HA.   It lasts all day to multiple days.     HIT6:64 Number of days in the last 4 weeks with:  Severe headache: 8 Moderate headache: 5 Mild headache: 10  No headache: 5   Past Medical History:  Diagnosis Date  . Acid reflux   . Asthma   . Diabetes mellitus without complication ([place])   . Hyperlipidemia   . PCOS (polycystic ovarian syndrome)     Social History   Social History  . Marital status: Married    Spouse name: N/A  . Number of children: N/A  . Years of education: N/A   Occupational History  . Not on file.   Social History Main Topics  . Smoking status: Never Smoker  . Smokeless tobacco: Never Used  . Alcohol use 3.6 oz/week    6 Standard drinks or equivalent per week     Comment: 1-2 every 2-3 weeks  . Drug use: No  . Sexual activity: Yes    Birth control/ protection: None   Other Topics Concern  . Not on file   Social History Narrative  . No narrative on file    Family History  Problem Relation Age of Onset  . Cancer Mother   . Hypertension Mother   . Diabetes Mother   . Breast cancer Mother 42  . Cancer Maternal Grandmother   . Breast cancer Maternal Grandmother 70  . Cancer Father   . Diabetes Father   . Hypertension Father   . Breast cancer Paternal Aunt 21  . Breast cancer Paternal Aunt 70  . Breast cancer Paternal Aunt 16    Allergies  Allergen Reactions  . Macrodantin [Nitrofurantoin Macrocrystal] Itching     Current Outpatient Prescriptions on File Prior to Visit  Medication Sig Dispense Refill  . albuterol (PROVENTIL) (2.5 MG/3ML) 0.083% nebulizer solution Take 2.5 mg by nebulization every 6 (six) hours as needed.    . ALPRAZolam (XANAX) 1 MG tablet Take 0.5 mg by mouth 2 (two) times daily as needed for anxiety.     Marland Kitchen atorvastatin (LIPITOR) 10 MG tablet Take 10 mg by mouth daily.    . baclofen (LIORESAL) 10 MG tablet   1  . beclomethasone (QVAR) 40 MCG/ACT inhaler Inhale 2 puffs into the lungs 2 (two) times daily. prn    . EQUETRO 200 MG CP12 12 hr capsule   1  . fluconazole (DIFLUCAN) 150 MG tablet Take 1 tablet (150 mg total) by mouth daily. 1 tablet 0  . glimepiride (AMARYL) 4 MG tablet Take 2 mg by mouth 2 (two) times daily.     . lansoprazole (PREVACID) 30 MG capsule Take 30 mg by mouth 2 (two) times daily before a meal.     . lisinopril (PRINIVIL,ZESTRIL) 5 MG tablet Take 5 mg by mouth daily.    . metFORMIN (GLUCOPHAGE) 1000 MG tablet Take 500 mg by mouth daily with breakfast. Reported on 12/27/2015    .  ranitidine (ZANTAC) 300 MG tablet Take 300 mg by mouth at bedtime.    . SUMAtriptan (IMITREX) 100 MG tablet   3  . zolpidem (AMBIEN) 10 MG tablet Take 10 mg by mouth at bedtime as needed. Reported on 10/17/2015     No current facility-administered medications on file prior to visit.      Review of Systems:  All pertinent positive/negative included in HPI, all other review of systems are negative   Objective:  Physical Exam BP 104/67 (BP Location: Left Arm, Patient Position: Sitting, Cuff Size: Large)   Pulse 75   Resp 18   Ht 5\' 3"  (1.6 m)   Wt 198 lb (89.8 kg)   LMP 11/03/2016   BMI 35.07 kg/m  CONSTITUTIONAL: Well-developed, well-nourished female in no acute distress.  EYES: EOM intact ENT: Normocephalic CARDIOVASCULAR: Regular rate  RESPIRATORY: Normal rate.  MUSCULOSKELETAL: Normal ROM SKIN: Warm, dry without erythema  NEUROLOGICAL: Alert, oriented, CN II-XII  grossly intact, Appropriate balance   Assessment & Plan:  Assessment: 1. Migraine without aura and without status migrainosus, not intractable   2. Chronic migraine      Plan Topamax - for migraine prevention - patient to begin with 25mg  daily and increase to 75mg  daily over the next 3 weeks.   Patient must be on reliable contraception while using this medication.  She may use condoms until seeing her Ob/Gyn.  She is advised that Topamax can be teratogenic and this is a significant risk to a potential pregnancy.   She will use Baclofen and Reglan liberally for mild HA as well as nausea/muscle spasm as appropriate Limit ibuprofen to a couple days per week - as well as Tylenol. Maxalt - will replace Imitrex for acute migraine treatment.  Use early in the migraine.  May repeat in 2 hours/max of 2/day and max of 2 days/week  Follow-up in 2-3 months or sooner PRN  Paticia Stack, PA-C 11/23/2016 8:54 AM

## 2016-11-27 DIAGNOSIS — B373 Candidiasis of vulva and vagina: Secondary | ICD-10-CM | POA: Diagnosis not present

## 2016-12-17 ENCOUNTER — Encounter: Payer: Self-pay | Admitting: Infectious Diseases

## 2016-12-17 ENCOUNTER — Other Ambulatory Visit: Payer: Self-pay | Admitting: Infectious Diseases

## 2016-12-17 DIAGNOSIS — N898 Other specified noninflammatory disorders of vagina: Secondary | ICD-10-CM | POA: Insufficient documentation

## 2016-12-17 NOTE — Progress Notes (Signed)
Ordered vaingal cx

## 2016-12-20 DIAGNOSIS — B373 Candidiasis of vulva and vagina: Secondary | ICD-10-CM | POA: Diagnosis not present

## 2016-12-20 DIAGNOSIS — Z113 Encounter for screening for infections with a predominantly sexual mode of transmission: Secondary | ICD-10-CM | POA: Diagnosis not present

## 2016-12-27 DIAGNOSIS — N898 Other specified noninflammatory disorders of vagina: Secondary | ICD-10-CM | POA: Diagnosis not present

## 2017-01-01 DIAGNOSIS — D259 Leiomyoma of uterus, unspecified: Secondary | ICD-10-CM | POA: Diagnosis not present

## 2017-01-01 DIAGNOSIS — G43909 Migraine, unspecified, not intractable, without status migrainosus: Secondary | ICD-10-CM | POA: Insufficient documentation

## 2017-01-01 DIAGNOSIS — K589 Irritable bowel syndrome without diarrhea: Secondary | ICD-10-CM | POA: Insufficient documentation

## 2017-01-01 DIAGNOSIS — N898 Other specified noninflammatory disorders of vagina: Secondary | ICD-10-CM | POA: Diagnosis not present

## 2017-01-01 DIAGNOSIS — F419 Anxiety disorder, unspecified: Secondary | ICD-10-CM | POA: Insufficient documentation

## 2017-01-01 DIAGNOSIS — G43901 Migraine, unspecified, not intractable, with status migrainosus: Secondary | ICD-10-CM | POA: Insufficient documentation

## 2017-01-01 DIAGNOSIS — R8290 Unspecified abnormal findings in urine: Secondary | ICD-10-CM | POA: Diagnosis not present

## 2017-01-10 DIAGNOSIS — F411 Generalized anxiety disorder: Secondary | ICD-10-CM | POA: Diagnosis not present

## 2017-01-10 DIAGNOSIS — F3341 Major depressive disorder, recurrent, in partial remission: Secondary | ICD-10-CM | POA: Diagnosis not present

## 2017-01-28 DIAGNOSIS — G4733 Obstructive sleep apnea (adult) (pediatric): Secondary | ICD-10-CM | POA: Diagnosis not present

## 2017-02-04 ENCOUNTER — Telehealth: Payer: Self-pay | Admitting: *Deleted

## 2017-02-04 DIAGNOSIS — G43009 Migraine without aura, not intractable, without status migrainosus: Secondary | ICD-10-CM

## 2017-02-04 MED ORDER — PROMETHAZINE HCL 25 MG PO TABS: 25.0000 mg | ORAL_TABLET | Freq: Four times a day (QID) | ORAL | 0 refills | Status: DC | PRN

## 2017-02-04 NOTE — Telephone Encounter (Signed)
-----   Message from Blanchie Dessert, [place] sent at 02/04/2017  8:37 AM EDT ----- Regarding: pt requesting Rx change from [place]: [phone] Please call pt back concerning a Rx change that she would like to get and also to let [facility] know she is having side effects with one of her meds

## 2017-02-04 NOTE — Telephone Encounter (Signed)
Spoke to pt, has been taking Topamax 75mg  and is experiencing tingling in her fingers and toes.  Instructed pt to take Vitamin B6 100mg  twice daily to help with neurologic side effects per Santiago Glad.  Pt has also been using Reglan for nausea associated with migraines and states it is not working well for her, wanted to see if she could try Phenergan.  Rx sent to pharmacy. Will contact pt when July schedule is available to schedule follow-up with Santiago Glad.

## 2017-02-07 DIAGNOSIS — E119 Type 2 diabetes mellitus without complications: Secondary | ICD-10-CM | POA: Diagnosis not present

## 2017-02-14 DIAGNOSIS — F3341 Major depressive disorder, recurrent, in partial remission: Secondary | ICD-10-CM | POA: Diagnosis not present

## 2017-02-14 DIAGNOSIS — F411 Generalized anxiety disorder: Secondary | ICD-10-CM | POA: Diagnosis not present

## 2017-02-22 DIAGNOSIS — E669 Obesity, unspecified: Secondary | ICD-10-CM | POA: Diagnosis not present

## 2017-02-22 DIAGNOSIS — E119 Type 2 diabetes mellitus without complications: Secondary | ICD-10-CM | POA: Diagnosis not present

## 2017-03-11 ENCOUNTER — Other Ambulatory Visit: Payer: Self-pay | Admitting: Physician Assistant

## 2017-03-15 NOTE — Telephone Encounter (Signed)
Pt to RTC for office visit asap

## 2017-03-20 ENCOUNTER — Encounter: Payer: 59 | Attending: Internal Medicine | Admitting: Dietician

## 2017-03-20 ENCOUNTER — Encounter: Payer: Self-pay | Admitting: Dietician

## 2017-03-20 VITALS — Ht 64.0 in | Wt 204.1 lb

## 2017-03-20 DIAGNOSIS — E119 Type 2 diabetes mellitus without complications: Secondary | ICD-10-CM | POA: Insufficient documentation

## 2017-03-20 DIAGNOSIS — Z6835 Body mass index (BMI) 35.0-35.9, adult: Secondary | ICD-10-CM

## 2017-03-20 DIAGNOSIS — N941 Unspecified dyspareunia: Secondary | ICD-10-CM | POA: Insufficient documentation

## 2017-03-20 DIAGNOSIS — N912 Amenorrhea, unspecified: Secondary | ICD-10-CM | POA: Insufficient documentation

## 2017-03-20 DIAGNOSIS — Z713 Dietary counseling and surveillance: Secondary | ICD-10-CM | POA: Diagnosis not present

## 2017-03-20 NOTE — Progress Notes (Signed)
Medical Nutrition Therapy: Visit start time: 0900  end time: 1000  Assessment:  Diagnosis: Diabetes Past medical history: sleep apnea, GERD Psychosocial issues/ stress concerns: none Preferred learning method:  . Hands-on  Current weight: 204.1lbs  Height: 5'4" Medications, supplements: reconciled list in medical record  Progress and evaluation: Patient reports that a change in diabetes medications due to frequent yeast infections led to increase in BGs and cravings for sweets, sweet drinks. Now has changed meds again which has helped; she has decreased intake of sweets again. She reports erratic eating pattern, skipping 1-2 meals almost daily, and is having difficulty losing weight. She attended diabetes classes at this office several years ago, now needs review of diet, and help with implementing healthy changes.   Physical activity: not regularly; started walking 1x a week, aerobics class yesterday  Dietary Intake:  Usual eating pattern includes 1-3 meals and 1-2 snacks per day. Dining out frequency: 2 meals per week.  Breakfast: no appetite, usually none. Has protein drinks; weekends-- eggs, bacon, toast; oatmeal and 2 boiled eggs sometimes on Monday am. Often drinks water or sweet tea. Snack: maybe  Lunch: eats 3-4 times a week, often does not like options at cafeteria. [place] biscuits with almond butter.  Snack: sometimes crackers Supper: lean meat, small starch portion, vegetable. Doesn't like many vegetables.  Snack: often waking up 2-3 am hungry, eats crackers. Beverages: water, sweet tea 1x daily, occasional regular soda (decreased from 2-3 daily)  Nutrition Care Education: Topics covered: diabetes, weight management Basic nutrition: appropriate nutrient balance, appropriate meal and snack schedule    Weight control: importance of eating at regular intervals, benefits of tracking food intake, appropriate food portions and nutrient balance; role of physical  activity. Diabetes: appropriate meal and snack schedule, appropriate carb intake and balance, plate method for meal planning.   Nutritional Diagnosis:  [place]-2.2 Altered nutrition-related laboratory As related to diabetes.  As evidenced by HbA1C of 8%. [employer]-3.3 Overweight/obesity As related to excess calories, inactivity.  As evidenced by BMI 35, patient report.  Intervention: Instruction as noted above.   Patient has begun making positive changes, is ready to work on additional changes gradually.    Set goals with input from patient.  Education Materials given:  Marland Kitchen Sample meal pattern/ menus . Goals/ instructions  Learner/ who was taught:  . Patient   Level of understanding: Marland Kitchen Verbalizes/ demonstrates competency  Demonstrated degree of understanding via:   Teach back Learning barriers: . None  Willingness to learn/ readiness for change: . Eager, change in progress  Monitoring and Evaluation:  Dietary intake, exercise, BG control, and body weight      follow up: in 4-6 weeks

## 2017-03-20 NOTE — Patient Instructions (Signed)
   Include a quick breakfast option such as [place] biscuits and a protein drink, or Delights sandwich, or boiled eggs and toast, or yogurt with fruit.   Lunch options -- supper leftovers, sandwich with ham or [place] and carrots, low fat frozen meal.   Plan to eat something every 4-5 hours during the day.  Keep a food journal to monitor your intake.   Continue to build some exercise into your week. Allow for a few minutes for moving each day, or gradually add more days each week.

## 2017-04-03 DIAGNOSIS — F411 Generalized anxiety disorder: Secondary | ICD-10-CM | POA: Diagnosis not present

## 2017-04-03 DIAGNOSIS — F3341 Major depressive disorder, recurrent, in partial remission: Secondary | ICD-10-CM | POA: Diagnosis not present

## 2017-04-04 ENCOUNTER — Other Ambulatory Visit: Payer: Self-pay | Admitting: Physician Assistant

## 2017-04-11 DIAGNOSIS — Z309 Encounter for contraceptive management, unspecified: Secondary | ICD-10-CM | POA: Diagnosis not present

## 2017-04-12 ENCOUNTER — Ambulatory Visit (INDEPENDENT_AMBULATORY_CARE_PROVIDER_SITE_OTHER): Payer: 59 | Admitting: Physician Assistant

## 2017-04-12 ENCOUNTER — Encounter: Payer: Self-pay | Admitting: Physician Assistant

## 2017-04-12 VITALS — BP 139/69 | HR 83 | Ht 64.0 in | Wt 203.6 lb

## 2017-04-12 DIAGNOSIS — M62838 Other muscle spasm: Secondary | ICD-10-CM | POA: Diagnosis not present

## 2017-04-12 DIAGNOSIS — G43009 Migraine without aura, not intractable, without status migrainosus: Secondary | ICD-10-CM | POA: Diagnosis not present

## 2017-04-12 MED ORDER — RIZATRIPTAN BENZOATE 10 MG PO TABS: 10.0000 mg | ORAL_TABLET | ORAL | 4 refills | Status: DC | PRN

## 2017-04-12 MED ORDER — TOPIRAMATE 100 MG PO TABS: 100.0000 mg | ORAL_TABLET | Freq: Every day | ORAL | 4 refills | Status: DC

## 2017-04-12 MED ORDER — PROMETHAZINE HCL 25 MG PO TABS: 25.0000 mg | ORAL_TABLET | Freq: Four times a day (QID) | ORAL | 0 refills | Status: DC | PRN

## 2017-04-12 NOTE — Patient Instructions (Signed)

## 2017-04-12 NOTE — Progress Notes (Signed)
History:  Julie Hansen is a 46 y.o. [id number] who presents to clinic today for HA followup.  She ran out of her Topamax and therefore is no longer taking it.  She had not noticed improvement while she was using it.  She had tingling of her pinky fingers and toes but did not try the vitamin b6 to assist with this.  She discontinued drinking sodas while she was active on the topamax.   She believes maxalt was more helpful than prior triptan Reglan was not helpful.  Phenergan was called in and that is much better.  She notes most HAs are later in the evening and she does not require phenergan during the day.   No weight loss yet  HIT6:54 Number of days in the last 4 weeks with:  Severe headache: 0 Moderate headache: 10 Mild headache: 0  No headache: 18   Past Medical History:  Diagnosis Date  . Acid reflux   . Asthma   . Diabetes mellitus without complication ([place])   . Hyperlipidemia   . PCOS (polycystic ovarian syndrome)     Social History   Social History  . Marital status: Married    Spouse name: N/A  . Number of children: N/A  . Years of education: N/A   Occupational History  . Not on file.   Social History Main Topics  . Smoking status: Never Smoker  . Smokeless tobacco: Never Used  . Alcohol use 3.6 oz/week    6 Standard drinks or equivalent per week     Comment: 1-2 every 2-3 weeks  . Drug use: No  . Sexual activity: Yes    Birth control/ protection: None   Other Topics Concern  . Not on file   Social History Narrative  . No narrative on file    Family History  Problem Relation Age of Onset  . Cancer Mother   . Hypertension Mother   . Diabetes Mother   . Breast cancer Mother 67  . Cancer Maternal Grandmother   . Breast cancer Maternal Grandmother 1  . Cancer Father   . Diabetes Father   . Hypertension Father   . Breast cancer Paternal Aunt 20  . Breast cancer Paternal Aunt 73  . Breast cancer Paternal Aunt 67    Allergies  Allergen  Reactions  . Hydrocodone-Homatropine Itching  . Macrodantin [Nitrofurantoin Macrocrystal] Itching  . Carbamazepine Other (See Comments)    Vaginal discharge    Current Outpatient Prescriptions on File Prior to Visit  Medication Sig Dispense Refill  . albuterol (PROVENTIL) (2.5 MG/3ML) 0.083% nebulizer solution Take 2.5 mg by nebulization every 6 (six) hours as needed.    . ALPRAZolam (XANAX) 1 MG tablet Take 0.5 mg by mouth 2 (two) times daily as needed for anxiety.     Marland Kitchen atorvastatin (LIPITOR) 10 MG tablet Take 10 mg by mouth daily.    . baclofen (LIORESAL) 10 MG tablet Take 1 tablet (10 mg total) by mouth daily as needed. 30 each 1  . beclomethasone (QVAR) 40 MCG/ACT inhaler Inhale 2 puffs into the lungs 2 (two) times daily. prn    . canagliflozin (INVOKANA) 300 MG TABS tablet Take by mouth.    Marland Kitchen glimepiride (AMARYL) 4 MG tablet Take 2 mg by mouth 2 (two) times daily.     . lansoprazole (PREVACID) 30 MG capsule Take 30 mg by mouth 2 (two) times daily before a meal.     . lisinopril (PRINIVIL,ZESTRIL) 5 MG tablet Take 5 mg by  mouth daily.    . metFORMIN (GLUCOPHAGE) 1000 MG tablet Take 500 mg by mouth 2 (two) times daily with a meal. Reported on 12/27/2015    . metoCLOPramide (REGLAN) 10 MG tablet Take 1 tablet (10 mg total) by mouth 3 (three) times daily as needed for nausea. 30 tablet 1  . promethazine (PHENERGAN) 25 MG tablet Take 1 tablet (25 mg total) by mouth every 6 (six) hours as needed for nausea or vomiting. 30 tablet 0  . ranitidine (ZANTAC) 300 MG tablet Take 300 mg by mouth at bedtime.    . rizatriptan (MAXALT) 10 MG tablet TAKE ONE TABLET BY MOUTH AS NEEDED FOR MIGRAINE, MAY REPEAT IN 2 HOURS IF NEEDED 9 tablet 0  . SUMAtriptan (IMITREX) 100 MG tablet   3  . topiramate (TOPAMAX) 25 MG tablet Take 3 tablets (75 mg total) by mouth every morning. 90 tablet 2  . zolpidem (AMBIEN) 10 MG tablet Take 10 mg by mouth at bedtime as needed. Reported on 10/17/2015     No current  facility-administered medications on file prior to visit.      Review of Systems:  All pertinent positive/negative included in HPI, all other review of systems are negative   Objective:  Physical Exam BP 139/69   Pulse 83   Ht 5\' 4"  (1.626 m)   Wt 203 lb 9.6 oz (92.4 kg)   LMP 03/31/2017   BMI 34.95 kg/m  CONSTITUTIONAL: Well-developed, well-nourished female in no acute distress.  EYES: EOM intact ENT: Normocephalic CARDIOVASCULAR: Regular rate .  RESPIRATORY: Normal rate MUSCULOSKELETAL: Normal ROM, strength equal bilaterally SKIN: Warm, dry without erythema  NEUROLOGICAL: Alert, oriented, CN II-XII grossly intact, Appropriate balance PSYCH: Normal behavior, mood   Assessment & Plan:  Assessment: 1. Migraine without aura and without status migrainosus, not intractable   No improvement    Plan: Restart Topamax.  Will give 100mg  tabs and she will take 1/2 tab only for several days to re-acclimate to the medication Will use maxalt acutely.  Add ibuprofen for additional efficacy.  May use prior to the [place].   Continue phenergan for nausea or HA rescue Baclofen prn HA or muscle spasm exercise  Follow-up in 3 months or sooner PRN  Paticia Stack, PA-C 04/12/2017 10:26 AM

## 2017-05-09 DIAGNOSIS — G4733 Obstructive sleep apnea (adult) (pediatric): Secondary | ICD-10-CM | POA: Diagnosis not present

## 2017-05-15 ENCOUNTER — Encounter: Payer: 59 | Attending: Internal Medicine | Admitting: Dietician

## 2017-05-15 VITALS — Ht 64.0 in | Wt 204.5 lb

## 2017-05-15 DIAGNOSIS — N925 Other specified irregular menstruation: Secondary | ICD-10-CM | POA: Diagnosis not present

## 2017-05-15 DIAGNOSIS — E119 Type 2 diabetes mellitus without complications: Secondary | ICD-10-CM | POA: Diagnosis not present

## 2017-05-15 DIAGNOSIS — Z6835 Body mass index (BMI) 35.0-35.9, adult: Secondary | ICD-10-CM

## 2017-05-15 DIAGNOSIS — Z713 Dietary counseling and surveillance: Secondary | ICD-10-CM | POA: Insufficient documentation

## 2017-05-15 NOTE — Progress Notes (Signed)
Medical Nutrition Therapy: Visit start time: 0930  end time: 1000  Assessment:  Diagnosis: diabetes, obesity Medical history changes: no changes per patient Psychosocial issues/ stress concerns: none  Current weight: 204.5lbs  Height: 5'4" Medications, supplement changes: no changes per patient  Progress and evaluation: Weight has remained stable since previous visit on 03/20/17. Patient reports symptoms of nausea for the past several days, more severe yesterday when she had an episode of vomiting. She reports sometimes waking up during the night and craving sweet beverages; has not yet tested BG during the night. She reports delayed menstruation x2 weeks, wonders if she is starting peri-menopause. She reports positive changes in the past 2 months, including some exercise, eating breakfast more regularly, generally making healthy food choices.    Physical activity: walking 1 day per week; taking steps down 2 floors at work, occasionally up also.   Dietary Intake:  Usual eating pattern includes 2-3 meals and 1 snacks per day. Dining out frequency: not assessed today.  Breakfast: grits with cheese and 1 strip bacon Snack: none Lunch: sometimes leftovers, sometimes none Snack: snack pack of nuts, cheese, dried cranberries Supper: lean meat, vegetables, small portion of a starch Snack: juice or fruit punch drink during the night Beverages: water, juice or fruit punch  Nutrition Care Education: Topics covered: diabetes, weight management   Weight control: determining reasonable weight loss rate, benefits of tracking food intake and physical activity, as well as GI or other physical symptoms, BG control; effects of hormonal changes on weight and food cravings; strategies for improving lunch meals.  Diabetes: appropriate carb intake and balance, strategies for reducing sugar-sweetened beverages   Nutritional Diagnosis:  [place]-2.2 Altered nutrition-related laboratory As related to diabetes.  As  evidenced by HbA1C of 8%. [place]-3.3 Overweight/obesity As related to excess calories, inactivity.  As evidenced by BMI 35, patient report.  Intervention: Discussion as noted above.   Updated goals with direction from patient.    Encouraged her to address GI and disrupted sleep issues.    She will call to schedule next follow-up when able to see her work schedule.   Education Materials given:  Marland Kitchen Goals/ instructions   Learner/ who was taught:  . Patient   Level of understanding: Marland Kitchen Verbalizes/ demonstrates competency   Demonstrated degree of understanding via:   Teach back Learning barriers: . None  Willingness to learn/ readiness for change: . Eager, change in progress  Monitoring and Evaluation:  Dietary intake, exercise, BG control, and body weight      follow up: in 4 week(s)

## 2017-05-15 NOTE — Patient Instructions (Signed)
   Continue with healthy eating pattern, great job!  Bring lunch to work 2 times a week or more.   Start tracking food intake, exercise, and any hunger/craving symptoms, nausea, BG issues, etc.   Gradually reduce portions of sugar-sweetened drinks by pouring a small cup, or diluting with a small amount of water.   Continue to increase exercise, add 1 or more days of walking during the week, take stairs up more often at work.

## 2017-05-16 DIAGNOSIS — E119 Type 2 diabetes mellitus without complications: Secondary | ICD-10-CM | POA: Diagnosis not present

## 2017-05-31 DIAGNOSIS — N925 Other specified irregular menstruation: Secondary | ICD-10-CM | POA: Diagnosis not present

## 2017-05-31 DIAGNOSIS — Z113 Encounter for screening for infections with a predominantly sexual mode of transmission: Secondary | ICD-10-CM | POA: Diagnosis not present

## 2017-05-31 DIAGNOSIS — Z3480 Encounter for supervision of other normal pregnancy, unspecified trimester: Secondary | ICD-10-CM | POA: Diagnosis not present

## 2017-06-03 DIAGNOSIS — O200 Threatened abortion: Secondary | ICD-10-CM | POA: Diagnosis not present

## 2017-06-04 DIAGNOSIS — Z0000 Encounter for general adult medical examination without abnormal findings: Secondary | ICD-10-CM | POA: Diagnosis not present

## 2017-06-05 ENCOUNTER — Encounter (HOSPITAL_COMMUNITY)
Admission: AD | Disposition: A | Discharge status: Home / Self Care | Payer: Self-pay | Source: Ambulatory Visit | Attending: Obstetrics & Gynecology

## 2017-06-05 ENCOUNTER — Ambulatory Visit (HOSPITAL_COMMUNITY): Payer: 59 | Admitting: Anesthesiology

## 2017-06-05 ENCOUNTER — Ambulatory Visit (HOSPITAL_COMMUNITY)
Admission: AD | Admit: 2017-06-05 | Discharge: 2017-06-05 | Disposition: A | Discharge status: Home / Self Care | Payer: 59 | Source: Ambulatory Visit | Attending: Obstetrics & Gynecology | Admitting: Obstetrics & Gynecology

## 2017-06-05 ENCOUNTER — Encounter (HOSPITAL_COMMUNITY): Payer: Self-pay

## 2017-06-05 DIAGNOSIS — Z79899 Other long term (current) drug therapy: Secondary | ICD-10-CM | POA: Insufficient documentation

## 2017-06-05 DIAGNOSIS — Z7984 Long term (current) use of oral hypoglycemic drugs: Secondary | ICD-10-CM | POA: Insufficient documentation

## 2017-06-05 DIAGNOSIS — K219 Gastro-esophageal reflux disease without esophagitis: Secondary | ICD-10-CM | POA: Diagnosis not present

## 2017-06-05 DIAGNOSIS — E119 Type 2 diabetes mellitus without complications: Secondary | ICD-10-CM | POA: Insufficient documentation

## 2017-06-05 DIAGNOSIS — E785 Hyperlipidemia, unspecified: Secondary | ICD-10-CM | POA: Insufficient documentation

## 2017-06-05 DIAGNOSIS — E282 Polycystic ovarian syndrome: Secondary | ICD-10-CM | POA: Insufficient documentation

## 2017-06-05 DIAGNOSIS — O021 Missed abortion: Secondary | ICD-10-CM | POA: Insufficient documentation

## 2017-06-05 DIAGNOSIS — O200 Threatened abortion: Secondary | ICD-10-CM | POA: Diagnosis not present

## 2017-06-05 DIAGNOSIS — J45909 Unspecified asthma, uncomplicated: Secondary | ICD-10-CM | POA: Insufficient documentation

## 2017-06-05 HISTORY — PX: DILATION AND EVACUATION: SHX1459

## 2017-06-05 HISTORY — DX: Other specified postprocedural states: Z98.890

## 2017-06-05 HISTORY — DX: Other specified postprocedural states: R11.2

## 2017-06-05 LAB — GLUCOSE, CAPILLARY
GLUCOSE-CAPILLARY: 183 mg/dL — AB (ref 65–99)
Glucose-Capillary: 163 mg/dL — ABNORMAL HIGH (ref 65–99)

## 2017-06-05 SURGERY — DILATION AND EVACUATION, UTERUS
Anesthesia: Monitor Anesthesia Care | Site: Vagina

## 2017-06-05 MED ORDER — LIDOCAINE HCL 1 % IJ SOLN
INTRAMUSCULAR | Status: DC | PRN
  Administered 2017-06-05: 10 mL

## 2017-06-05 MED ORDER — HYDROCODONE-ACETAMINOPHEN 5-325 MG PO TABS
ORAL_TABLET | ORAL | Status: AC
  Administered 2017-06-05: 1 via ORAL
  Filled 2017-06-05: qty 1

## 2017-06-05 MED ORDER — KETOROLAC TROMETHAMINE 30 MG/ML IJ SOLN: 30.0000 mg | Freq: Four times a day (QID) | INTRAMUSCULAR | Status: DC

## 2017-06-05 MED ORDER — LIDOCAINE HCL (CARDIAC) 20 MG/ML IV SOLN
INTRAVENOUS | Status: DC | PRN
  Administered 2017-06-05: 100 mg via INTRAVENOUS

## 2017-06-05 MED ORDER — GLYCOPYRROLATE 0.2 MG/ML IJ SOLN
INTRAMUSCULAR | Status: AC
  Filled 2017-06-05: qty 1

## 2017-06-05 MED ORDER — SIMETHICONE 80 MG PO CHEW
80.0000 mg | CHEWABLE_TABLET | Freq: Four times a day (QID) | ORAL | Status: DC | PRN
  Filled 2017-06-05: qty 1

## 2017-06-05 MED ORDER — PROPOFOL 10 MG/ML IV BOLUS
INTRAVENOUS | Status: DC | PRN
  Administered 2017-06-05: 300 mg via INTRAVENOUS

## 2017-06-05 MED ORDER — MIDAZOLAM HCL 2 MG/2ML IJ SOLN
INTRAMUSCULAR | Status: AC
  Filled 2017-06-05: qty 2

## 2017-06-05 MED ORDER — MIDAZOLAM HCL 2 MG/2ML IJ SOLN
INTRAMUSCULAR | Status: DC | PRN
  Administered 2017-06-05 (×2): 1 mg via INTRAVENOUS

## 2017-06-05 MED ORDER — SCOPOLAMINE 1 MG/3DAYS TD PT72
MEDICATED_PATCH | TRANSDERMAL | Status: AC
  Administered 2017-06-05: 1.5 mg via TRANSDERMAL
  Filled 2017-06-05: qty 1

## 2017-06-05 MED ORDER — ONDANSETRON HCL 4 MG PO TABS: 4.0000 mg | ORAL_TABLET | Freq: Four times a day (QID) | ORAL | Status: DC | PRN

## 2017-06-05 MED ORDER — PROMETHAZINE HCL 12.5 MG PO TABS: 12.5000 mg | ORAL_TABLET | Freq: Four times a day (QID) | ORAL | 0 refills | Status: DC | PRN

## 2017-06-05 MED ORDER — GLYCOPYRROLATE 0.2 MG/ML IJ SOLN
INTRAMUSCULAR | Status: DC | PRN
  Administered 2017-06-05: 0.1 mg via INTRAVENOUS

## 2017-06-05 MED ORDER — ONDANSETRON HCL 4 MG/2ML IJ SOLN: 4.0000 mg | Freq: Four times a day (QID) | INTRAMUSCULAR | Status: DC | PRN

## 2017-06-05 MED ORDER — PROPOFOL 10 MG/ML IV BOLUS
INTRAVENOUS | Status: AC
  Filled 2017-06-05: qty 20

## 2017-06-05 MED ORDER — DOXYCYCLINE HYCLATE 50 MG PO CAPS: 50.0000 mg | ORAL_CAPSULE | Freq: Two times a day (BID) | ORAL | 0 refills | Status: AC

## 2017-06-05 MED ORDER — DIPHENHYDRAMINE HCL 50 MG/ML IJ SOLN: 25.0000 mg | Freq: Four times a day (QID) | INTRAMUSCULAR | Status: DC | PRN

## 2017-06-05 MED ORDER — KETOROLAC TROMETHAMINE 30 MG/ML IJ SOLN
INTRAMUSCULAR | Status: DC | PRN
  Administered 2017-06-05: 30 mg via INTRAVENOUS

## 2017-06-05 MED ORDER — FENTANYL CITRATE (PF) 100 MCG/2ML IJ SOLN
INTRAMUSCULAR | Status: DC | PRN
  Administered 2017-06-05 (×4): 50 ug via INTRAVENOUS

## 2017-06-05 MED ORDER — IBUPROFEN 800 MG PO TABS: 800.0000 mg | ORAL_TABLET | Freq: Three times a day (TID) | ORAL | 3 refills | Status: DC | PRN

## 2017-06-05 MED ORDER — LACTATED RINGERS IV SOLN
INTRAVENOUS | Status: DC
  Administered 2017-06-05: 125 mL/h via INTRAVENOUS
  Administered 2017-06-05: 12:00:00 via INTRAVENOUS

## 2017-06-05 MED ORDER — LIDOCAINE HCL (CARDIAC) 20 MG/ML IV SOLN
INTRAVENOUS | Status: AC
  Filled 2017-06-05: qty 5

## 2017-06-05 MED ORDER — KETOROLAC TROMETHAMINE 30 MG/ML IJ SOLN
INTRAMUSCULAR | Status: AC
  Filled 2017-06-05: qty 1

## 2017-06-05 MED ORDER — LIDOCAINE HCL 1 % IJ SOLN
INTRAMUSCULAR | Status: AC
  Filled 2017-06-05: qty 20

## 2017-06-05 MED ORDER — LACTATED RINGERS IV SOLN: INTRAVENOUS | Status: DC

## 2017-06-05 MED ORDER — PROMETHAZINE HCL 25 MG/ML IJ SOLN: 6.2500 mg | INTRAMUSCULAR | Status: DC | PRN

## 2017-06-05 MED ORDER — ONDANSETRON HCL 4 MG/2ML IJ SOLN
INTRAMUSCULAR | Status: AC
  Filled 2017-06-05: qty 2

## 2017-06-05 MED ORDER — FENTANYL CITRATE (PF) 100 MCG/2ML IJ SOLN: 25.0000 ug | INTRAMUSCULAR | Status: DC | PRN

## 2017-06-05 MED ORDER — MORPHINE SULFATE (PF) 4 MG/ML IV SOLN: 1.0000 mg | INTRAVENOUS | Status: DC | PRN

## 2017-06-05 MED ORDER — DOXYCYCLINE HYCLATE 100 MG IV SOLR
100.0000 mg | Freq: Once | INTRAVENOUS | Status: AC
  Administered 2017-06-05: 100 mg via INTRAVENOUS
  Filled 2017-06-05: qty 100

## 2017-06-05 MED ORDER — OXYCODONE-ACETAMINOPHEN 5-325 MG PO TABS: 1.0000 | ORAL_TABLET | ORAL | Status: DC | PRN

## 2017-06-05 MED ORDER — FENTANYL CITRATE (PF) 250 MCG/5ML IJ SOLN
INTRAMUSCULAR | Status: AC
  Filled 2017-06-05: qty 5

## 2017-06-05 MED ORDER — HYDROCODONE-ACETAMINOPHEN 5-325 MG PO TABS
1.0000 | ORAL_TABLET | Freq: Once | ORAL | Status: DC
  Administered 2017-06-05: 1 via ORAL

## 2017-06-05 MED ORDER — ONDANSETRON HCL 4 MG/2ML IJ SOLN
INTRAMUSCULAR | Status: DC | PRN
  Administered 2017-06-05: 4 mg via INTRAVENOUS

## 2017-06-05 MED ORDER — LACTATED RINGERS IV SOLN
INTRAVENOUS | Status: DC
  Administered 2017-06-05: 13:00:00 via INTRAVENOUS

## 2017-06-05 MED ORDER — MENTHOL 3 MG MT LOZG
1.0000 | LOZENGE | OROMUCOSAL | Status: DC | PRN
  Filled 2017-06-05: qty 9

## 2017-06-05 MED ORDER — HYDROCODONE-ACETAMINOPHEN 5-325 MG PO TABS: 1.0000 | ORAL_TABLET | Freq: Four times a day (QID) | ORAL | 0 refills | Status: DC | PRN

## 2017-06-05 MED ORDER — SCOPOLAMINE 1 MG/3DAYS TD PT72
1.0000 | MEDICATED_PATCH | TRANSDERMAL | Status: DC
  Administered 2017-06-05: 1.5 mg via TRANSDERMAL

## 2017-06-05 SURGICAL SUPPLY — 19 items
CATH ROBINSON RED A/P 16FR (CATHETERS) ×2 IMPLANT
CLOTH BEACON ORANGE TIMEOUT ST (SAFETY) ×2 IMPLANT
DECANTER SPIKE VIAL GLASS SM (MISCELLANEOUS) ×2 IMPLANT
DILATOR CANAL MILEX (MISCELLANEOUS) IMPLANT
GLOVE BIO SURGEON STRL SZ 6.5 (GLOVE) ×2 IMPLANT
GLOVE BIOGEL PI IND STRL 7.0 (GLOVE) ×3 IMPLANT
GLOVE BIOGEL PI INDICATOR 7.0 (GLOVE) ×3
GOWN STRL REUS W/TWL LRG LVL3 (GOWN DISPOSABLE) ×4 IMPLANT
KIT BERKELEY 1ST TRIMESTER 3/8 (MISCELLANEOUS) ×2 IMPLANT
NS IRRIG 1000ML POUR BTL (IV SOLUTION) ×2 IMPLANT
PACK VAGINAL MINOR WOMEN LF (CUSTOM PROCEDURE TRAY) ×2 IMPLANT
PAD OB MATERNITY 4.3X12.25 (PERSONAL CARE ITEMS) ×2 IMPLANT
PAD PREP 24X48 CUFFED NSTRL (MISCELLANEOUS) ×2 IMPLANT
SET BERKELEY SUCTION TUBING (SUCTIONS) ×2 IMPLANT
TOWEL OR 17X24 6PK STRL BLUE (TOWEL DISPOSABLE) ×4 IMPLANT
VACURETTE 10 RIGID CVD (CANNULA) IMPLANT
VACURETTE 7MM CVD STRL WRAP (CANNULA) IMPLANT
VACURETTE 8 RIGID CVD (CANNULA) IMPLANT
VACURETTE 9 RIGID CVD (CANNULA) IMPLANT

## 2017-06-05 NOTE — Transfer of Care (Signed)
Immediate Anesthesia Transfer of Care Note  Patient: Julie Hansen  Procedure(s) Performed: Procedure(s): DILATATION AND EVACUATION (N/A)  Patient Location: PACU  Anesthesia Type:General  Level of Consciousness: awake, alert , oriented and patient cooperative  Airway & Oxygen Therapy: Patient Spontanous Breathing and Patient connected to nasal cannula oxygen  Post-op Assessment: Report given to RN and Post -op Vital signs reviewed and stable  Post vital signs: Reviewed and stable  Last Vitals:  Vitals:   06/05/17 1144 06/05/17 1417  BP: (!) 123/56   Pulse: 85 94  Resp: 16   Temp: 36.8 C 37.1 C  SpO2: 100%     Last Pain:  Vitals:   06/05/17 1144  TempSrc: Oral  PainSc: 3       Patients Stated Pain Goal: 2 (72/62/03 5597)  Complications: No apparent anesthesia complications

## 2017-06-05 NOTE — Anesthesia Procedure Notes (Signed)
Procedure Name: LMA Insertion Date/Time: 06/05/2017 1:27 PM Performed by: Georgeanne Nim Pre-anesthesia Checklist: Patient identified, Emergency Drugs available, Suction available, Patient being monitored and Timeout performed Patient Re-evaluated:Patient Re-evaluated prior to induction Oxygen Delivery Method: Circle system utilized Preoxygenation: Pre-oxygenation with 100% oxygen Induction Type: IV induction Ventilation: Mask ventilation without difficulty LMA: LMA with gastric port inserted LMA Size: 4.0 Number of attempts: 2 Placement Confirmation: positive ETCO2,  CO2 detector and breath sounds checked- equal and bilateral Tube secured with: Tape Dental Injury: Teeth and Oropharynx as per pre-operative assessment

## 2017-06-05 NOTE — Op Note (Signed)
Operative Note  DILATATION AND EVACUATION Procedure Note  Julie Hansen female 46 y.o. 06/05/17    Indications: The patient with retained products of conception after diagnosis of missed ab at 6 weeks.      Surgeon: Sanjuana Kava   Assistants: none  Anesthesia: General mask inhalational anesthesia and Local anesthesia 1% buffered lidocaine  ASA Class: 2   Findings:  8 week size uterus to 6 size post procedure. Moderate amount of retained POC.  Good crie was achieved.  Estimated Blood Loss:  50 mL         Drains: straight cath prior to procedure         Total IV Fluids: 1000 ml  Blood Given: none          Specimens: POC         Implants: none        Complications: None         Technique:   After adequate anesthesia was achieved, the patient was prepped and draped in the usual sterile fashion.  The operative Graves speculum was placed in the vagina and the cervix stabilized with a single-tooth tenaculum. A paracervical block was done using 1% Lidocaine plain.  The cervix was dilated with Kennon Rounds dilators and the 9 mm curette was used to remove contents of the uterus.  Alternating sharp curettage with a curette and suction curettage was performed until all contents were removed and good crie was achieved.  All instruments were removed from the vagina.  The patient tolerated the procedure well.    Disposition: PACU - hemodynamically stable.         Condition: stable  Julie Hansen [name]

## 2017-06-05 NOTE — Discharge Instructions (Signed)
DISCHARGE INSTRUCTIONS: D&C / D&E °The following instructions have been prepared to help you care for yourself upon your return home. °  °Personal hygiene: °• Use sanitary pads for vaginal drainage, not tampons. °• Shower the day after your procedure. °• NO tub baths, pools or Jacuzzis for 2-3 weeks. °• Wipe front to back after using the bathroom. ° °Activity and limitations: °• Do NOT drive or operate any equipment for 24 hours. The effects of anesthesia are still present and drowsiness may result. °• Do NOT rest in bed all day. °• Walking is encouraged. °• Walk up and down stairs slowly. °• You may resume your normal activity in one to two days or as indicated by your physician. ° °Sexual activity: NO intercourse for at least 2 weeks after the procedure, or as indicated by your physician. ° °Diet: Eat a light meal as desired this evening. You may resume your usual diet tomorrow. ° °Return to work: You may resume your work activities in one to two days or as indicated by your doctor. ° °What to expect after your surgery: Expect to have vaginal bleeding/discharge for 2-3 days and spotting for up to 10 days. It is not unusual to have soreness for up to 1-2 weeks. You may have a slight burning sensation when you urinate for the first day. Mild cramps may continue for a couple of days. You may have a regular period in 2-6 weeks. ° °Call your doctor for any of the following: °• Excessive vaginal bleeding, saturating and changing one pad every hour. °• Inability to urinate 6 hours after discharge from hospital. °• Pain not relieved by pain medication. °• Fever of 100.4° F or greater. °• Unusual vaginal discharge or odor. ° ° Call for an appointment:  ° ° °Patient’s signature: ______________________ ° °Nurse’s signature ________________________ ° °Support person's signature_______________________ ° ° ° °Post Anesthesia Home Care Instructions ° °Activity: °Get plenty of rest for the remainder of the day. A responsible  individual must stay with you for 24 hours following the procedure.  °For the next 24 hours, DO NOT: °-Drive a car °-Operate machinery °-Drink alcoholic beverages °-Take any medication unless instructed by your physician °-Make any legal decisions or sign important papers. ° °Meals: °Start with liquid foods such as gelatin or soup. Progress to regular foods as tolerated. Avoid greasy, spicy, heavy foods. If nausea and/or vomiting occur, drink only clear liquids until the nausea and/or vomiting subsides. Call your physician if vomiting continues. ° °Special Instructions/Symptoms: °Your throat may feel dry or sore from the anesthesia or the breathing tube placed in your throat during surgery. If this causes discomfort, gargle with warm salt water. The discomfort should disappear within 24 hours. ° °If you had a scopolamine patch placed behind your ear for the management of post- operative nausea and/or vomiting: ° °1. The medication in the patch is effective for 72 hours, after which it should be removed.  Wrap patch in a tissue and discard in the trash. Wash hands thoroughly with soap and water. °2. You may remove the patch earlier than 72 hours if you experience unpleasant side effects which may include dry mouth, dizziness or visual disturbances. °3. Avoid touching the patch. Wash your hands with soap and water after contact with the patch. °   ° °

## 2017-06-05 NOTE — Anesthesia Postprocedure Evaluation (Signed)
Anesthesia Post Note  Patient: Julie Hansen  Procedure(s) Performed: Procedure(s) (LRB): DILATATION AND EVACUATION (N/A)     Patient location during evaluation: PACU Anesthesia Type: General Level of consciousness: awake and alert Pain management: pain level controlled Vital Signs Assessment: post-procedure vital signs reviewed and stable Respiratory status: spontaneous breathing, nonlabored ventilation, respiratory function stable and patient connected to nasal cannula oxygen Cardiovascular status: blood pressure returned to baseline and stable Postop Assessment: no signs of nausea or vomiting Anesthetic complications: no    Last Vitals:  Vitals:   06/05/17 1515 06/05/17 1530  BP: 111/73 120/68  Pulse: 85 79  Resp: (!) 21 12  Temp:  37 C  SpO2: 97% 97%    Last Pain:  Vitals:   06/05/17 1515  TempSrc:   PainSc: 3    Pain Goal: Patients Stated Pain Goal: 2 (06/05/17 1515)               Tiajuana Amass

## 2017-06-05 NOTE — Anesthesia Preprocedure Evaluation (Addendum)
Anesthesia Evaluation  Patient identified by MRN, date of birth, ID band Patient awake    Reviewed: Allergy & Precautions, NPO status , Patient's Chart, lab work & pertinent test results  History of Anesthesia Complications (+) PONV  Airway Mallampati: II  TM Distance: >3 FB Neck ROM: Full    Dental  (+) Dental Advisory Given   Pulmonary asthma , sleep apnea ,    Pulmonary exam normal        Cardiovascular hypertension, Normal cardiovascular exam     Neuro/Psych  Headaches, Anxiety    GI/Hepatic Neg liver ROS, GERD  ,  Endo/Other  diabetes, Type 2, Oral Hypoglycemic Agents  Renal/GU negative Renal ROS     Musculoskeletal   Abdominal   Peds  Hematology negative hematology ROS (+)   Anesthesia Other Findings   Reproductive/Obstetrics                            Lab Results  Component Value Date   WBC 8.6 10/07/2014   HGB 12.3 10/07/2014   HCT 38.6 10/07/2014   MCV 88.1 10/07/2014   PLT 390 10/07/2014   Lab Results  Component Value Date   CREATININE 0.99 04/04/2016   BUN 11 04/04/2016   NA 137 04/04/2016   K 3.7 04/04/2016   CL 107 04/04/2016   CO2 23 04/04/2016    Anesthesia Physical Anesthesia Plan  ASA: II  Anesthesia Plan: General   Post-op Pain Management:    Induction:   PONV Risk Score and Plan: 4 or greater and Ondansetron, Dexamethasone, Midazolam, Scopolamine patch - Pre-op and Treatment may vary due to age or medical condition  Airway Management Planned: LMA  Additional Equipment:   Intra-op Plan:   Post-operative Plan: Extubation in OR  Informed Consent: I have reviewed the patients History and Physical, chart, labs and discussed the procedure including the risks, benefits and alternatives for the proposed anesthesia with the patient or authorized representative who has indicated his/her understanding and acceptance.   Dental advisory given  Plan  Discussed with: CRNA  Anesthesia Plan Comments:       Anesthesia Quick Evaluation

## 2017-06-05 NOTE — H&P (Signed)
Agusta Covington-Adams is an 46 y.o. female P2 with bleeding and cramping In office pelvic US showed Fetal demise at 6 weeks and gestational sac in lower uterine segment.   Pertinent Gynecological History: Menses: regular every 28 days without intermenstrual spotting Bleeding: abnormal bleeding d/t missed ab Contraception: none DES exposure: denies Blood transfusions: none Sexually transmitted diseases: no past history Previous GYN Procedures: none  Last mammogram: normal Date: 2018 Last pap: normal Date: 2018 OB History: G3, P2   Menstrual History: Menarche age: 89 Patient's last menstrual period was 03/31/2017 (exact date).    Past Medical History:  Diagnosis Date  . Acid reflux   . Asthma   . Diabetes mellitus without complication ([place])   . Hyperlipidemia   . PCOS (polycystic ovarian syndrome)   . PONV (postoperative nausea and vomiting)     Past Surgical History:  Procedure Laterality Date  . WISDOM TOOTH EXTRACTION      Family History  Problem Relation Age of Onset  . Cancer Mother   . Hypertension Mother   . Diabetes Mother   . Breast cancer Mother 43  . Cancer Maternal Grandmother   . Breast cancer Maternal Grandmother 1  . Cancer Father   . Diabetes Father   . Hypertension Father   . Breast cancer Paternal Aunt 40  . Breast cancer Paternal Aunt 79  . Breast cancer Paternal Aunt 108    Social History:  reports that she has never smoked. She has never used smokeless tobacco. She reports that she drinks about 3.6 oz of alcohol per week . She reports that she does not use drugs.  Allergies:  Allergies  Allergen Reactions  . Hydrocodone-Homatropine Itching  . Macrodantin [Nitrofurantoin Macrocrystal] Itching  . Carbamazepine Other (See Comments)    Vaginal discharge    Prescriptions Prior to Admission  Medication Sig Dispense Refill Last Dose  . ALPRAZolam (XANAX) 1 MG tablet Take 0.5 mg by mouth 2 (two) times daily as needed for anxiety.    Past  Month at Unknown time  . atorvastatin (LIPITOR) 10 MG tablet Take 10 mg by mouth daily.   Past Month at Unknown time  . baclofen (LIORESAL) 10 MG tablet Take 1 tablet (10 mg total) by mouth daily as needed. 30 each 1 Past Month at Unknown time  . canagliflozin (INVOKANA) 300 MG TABS tablet Take by mouth.   Past Month at Unknown time  . glimepiride (AMARYL) 4 MG tablet Take 2 mg by mouth 2 (two) times daily.    Past Month at Unknown time  . lansoprazole (PREVACID) 30 MG capsule Take 30 mg by mouth 2 (two) times daily before a meal.    06/04/2017 at Unknown time  . lisinopril (PRINIVIL,ZESTRIL) 5 MG tablet Take 5 mg by mouth daily.   Past Month at Unknown time  . metFORMIN (GLUCOPHAGE) 1000 MG tablet Take 500 mg by mouth 2 (two) times daily with a meal. Reported on 12/27/2015   06/04/2017 at Unknown time  . promethazine (PHENERGAN) 25 MG tablet Take 1 tablet (25 mg total) by mouth every 6 (six) hours as needed for nausea or vomiting. 30 tablet 0 Past Week at Unknown time  . ranitidine (ZANTAC) 300 MG tablet Take 300 mg by mouth at bedtime.   06/04/2017 at Unknown time  . rizatriptan (MAXALT) 10 MG tablet Take 1 tablet (10 mg total) by mouth as needed for migraine. May repeat in 2 hours if needed 12 tablet 4 Past Month at Unknown time  .  SUMAtriptan (IMITREX) 100 MG tablet   3 Past Month at Unknown time  . topiramate (TOPAMAX) 100 MG tablet Take 1 tablet (100 mg total) by mouth daily. 30 tablet 4 Past Month at Unknown time  . zolpidem (AMBIEN) 10 MG tablet Take 10 mg by mouth at bedtime as needed. Reported on 10/17/2015   Past Month at Unknown time  . albuterol (PROVENTIL) (2.5 MG/3ML) 0.083% nebulizer solution Take 2.5 mg by nebulization every 6 (six) hours as needed.   More than a month at Unknown time  . beclomethasone (QVAR) 40 MCG/ACT inhaler Inhale 2 puffs into the lungs 2 (two) times daily. prn   Unknown at Unknown time  . topiramate (TOPAMAX) 25 MG tablet Take 3 tablets (75 mg total) by mouth every  morning. 90 tablet 2 Taking    Review of Systems  Constitutional: Negative for fever.  All other systems reviewed and are negative.   Blood pressure (!) 123/56, pulse 85, temperature 98.2 F (36.8 C), temperature source Oral, resp. rate 16, last menstrual period 03/31/2017, SpO2 100 %. Physical Exam  Nursing note and vitals reviewed. Genitourinary:  Genitourinary Comments: vulva: no masses, atrophy, or lesions. Bladder/Urethra: no urethral discharge or mass and normal meatus and bladder non distended. Vagina no tenderness, erythema, cystocele, rectocele, abnormal vaginal discharge, or vesicle(s) or ulcers; old blood. Cervix: no cervical motion tenderness and discharge (blood - old); external os is closed, no active bleeding. Uterus: normal size and shape and midline, mobile, non-tender, and no uterine prolapse. Adnexa/Parametria: no parametrial tenderness or mass and no adnexal tenderness or ovarian mass.    No results found for this or any previous visit (from the past 24 hour(s)).  No results found.  Assessment/Plan: 46 yo s/p missed ab  Patient counseled about the diagnosis. She was reassured and comforted.  We discussed that miscarriage is when a pregnancy ends before a woman has been pregnant for 20 weeks. Often times we are unsure as to what causes a miscarriage, however different problems can cause miscarriage. It can happen when:  Marland Kitchen The fetus begins to develop but then stops growing, often because it has genetic problems . The mother has certain medical problems, such as poorly controlled diabetes, or problems with the shape of her uterus  We discussed management to include expectant, medical and surgical management. Patient decided to proceed with surgical management - via suction D&E to remove the contents of the uterus. We discussed risk of the procedure to include uterine perforation, infection, bleeding etc. We also discussed risk of intrauterine scar formation.   Her  blood type is O positive  Doxycycline for surgical prophylaxis.   Zamari Bonsall, [place] 06/05/2017, 12:15 PM

## 2017-06-06 ENCOUNTER — Encounter (HOSPITAL_COMMUNITY): Payer: Self-pay | Admitting: Obstetrics & Gynecology

## 2017-06-18 DIAGNOSIS — F411 Generalized anxiety disorder: Secondary | ICD-10-CM | POA: Diagnosis not present

## 2017-06-18 DIAGNOSIS — F3341 Major depressive disorder, recurrent, in partial remission: Secondary | ICD-10-CM | POA: Diagnosis not present

## 2017-06-18 DIAGNOSIS — E119 Type 2 diabetes mellitus without complications: Secondary | ICD-10-CM | POA: Diagnosis not present

## 2017-06-27 DIAGNOSIS — E559 Vitamin D deficiency, unspecified: Secondary | ICD-10-CM | POA: Diagnosis not present

## 2017-06-27 DIAGNOSIS — Z8759 Personal history of other complications of pregnancy, childbirth and the puerperium: Secondary | ICD-10-CM | POA: Diagnosis not present

## 2017-06-27 DIAGNOSIS — E1165 Type 2 diabetes mellitus with hyperglycemia: Secondary | ICD-10-CM | POA: Diagnosis not present

## 2017-07-08 ENCOUNTER — Telehealth: Payer: Self-pay | Admitting: Dietician

## 2017-07-08 NOTE — Telephone Encounter (Signed)
Ms. Julie Hansen called to schedule MNT follow-up; scheduled for 07/15/17 at 3pm.

## 2017-07-15 ENCOUNTER — Encounter: Payer: 59 | Attending: Internal Medicine | Admitting: Dietician

## 2017-07-15 ENCOUNTER — Encounter: Payer: Self-pay | Admitting: Dietician

## 2017-07-15 VITALS — Ht 64.0 in | Wt 212.2 lb

## 2017-07-15 DIAGNOSIS — Z713 Dietary counseling and surveillance: Secondary | ICD-10-CM | POA: Diagnosis not present

## 2017-07-15 DIAGNOSIS — E119 Type 2 diabetes mellitus without complications: Secondary | ICD-10-CM | POA: Diagnosis not present

## 2017-07-15 DIAGNOSIS — Z6835 Body mass index (BMI) 35.0-35.9, adult: Secondary | ICD-10-CM

## 2017-07-15 NOTE — Progress Notes (Signed)
Medical Nutrition Therapy: Visit start time: 1500  end time: 1545  Assessment:  Diagnosis: diabetes, obesity Medical history changes: has begun insulin therapy; miscarriage 05/2017 Psychosocial issues/ stress concerns: up and down emotions, grieving since miscarriage  Current weight: 212.2lbs  Height: 5'4" Medications, supplement changes: reconciled list in medical record  Progress and evaluation: Patient reports recent weight gain, wonders if fluctuating emotions since miscarriage could be playing a role. She has also begun taking insulin to treat Diabetes and is increasing dose every few days. Feels urge to snack more. She has decreased intake of sweets and sugar sweetened beverages, reporting less taste for sweets.   Physical activity: walking one day weekly, trying to increase  Dietary Intake:  Usual eating pattern includes 3 meals and 2-3 snacks per day. Dining out frequency: 3 meals per week.  Breakfast: 2 boiled eggs, sausage patty; oatmeal and eggs or egg whites Snack: none or chips Lunch: varied; cafeteria options such as 1/2 burger, meatballs and chicken wings Snack: small portion chips/ doritos Supper: baked/ broiled meat ([place] wing) sweet potato, cabbage Snack: chicken noodle soup, 1/2 bowl, or cheese toast Beverages: 1 liter water daily (was drinking about 20oz)  Nutrition Care Education: Topics covered: weight management, diabetes Weight control: healthy snack options, strategies to improve food choices at lunch such as sandwich on whole grain bread, bringing leftovers from home; benefit of regular exercise Diabetes:  appropriate meal and snack schedule, appropriate carb intake and balance, reviewed importance of limiting/ avoiding sugar-sweetened beverages for BG control as well as weight control. Other lifestyle changes:  Role of stress/ emotions on BG control, eating, and weight and allowing time for recovery from miscarriage.   Nutritional Diagnosis:  [place]-2.2 Altered  nutrition-related laboratory As related to diabetes.  As evidenced by HbA1C of 10.1%. [place]-3.3 Overweight/obesity As related to excess calories, inactivity, stress, and initiation of insulin therapy.  As evidenced by BMI 36.4, patient report.  Intervention: Discussion as noted above.   Updated goals with input from patient.    Patient voices readiness to make some gradual changes, but continues to deal with fluctuating emotions.   She states she will call back to schedule next follow-up visit.  Education Materials given:  Marland Kitchen Goals/ instructions  Learner/ who was taught:  . Patient   Level of understanding: Marland Kitchen Verbalizes/ demonstrates competency  Demonstrated degree of understanding via:   Teach back Learning barriers: . None  Willingness to learn/ readiness for change: . Eager, change in progress  Monitoring and Evaluation:  Dietary intake, exercise, BG control, and body weight      follow up: in 4-6 weeks

## 2017-07-15 NOTE — Patient Instructions (Signed)
   Keep working to increase exercise; start with short time periods and increase as your energy increases.   Continue to limit sugar-sweetened beverages to control blood sugar and eliminate "empty" calories.   Choose healthy snacks such as fruit combined with protein-- portion foods to control calories in snacks -- keep to 100-max 150 calories.   Consider bringing leftovers to work for lunch, or a healthy sandwich and fruit or a veggie.

## 2017-07-30 DIAGNOSIS — Z6836 Body mass index (BMI) 36.0-36.9, adult: Secondary | ICD-10-CM | POA: Diagnosis not present

## 2017-07-30 DIAGNOSIS — G4733 Obstructive sleep apnea (adult) (pediatric): Secondary | ICD-10-CM | POA: Diagnosis not present

## 2017-07-30 DIAGNOSIS — Z9989 Dependence on other enabling machines and devices: Secondary | ICD-10-CM | POA: Diagnosis not present

## 2017-08-01 DIAGNOSIS — F411 Generalized anxiety disorder: Secondary | ICD-10-CM | POA: Diagnosis not present

## 2017-08-01 DIAGNOSIS — F3341 Major depressive disorder, recurrent, in partial remission: Secondary | ICD-10-CM | POA: Diagnosis not present

## 2017-08-14 DIAGNOSIS — G4733 Obstructive sleep apnea (adult) (pediatric): Secondary | ICD-10-CM | POA: Diagnosis not present

## 2017-08-19 NOTE — Patient Outreach (Signed)
[facility]) Care Management  08/19/2017  Julie Hansen 1971/06/20 [id number]   I have removed myself from the [facility] care team but will continue to assist Julie Hansen in the [facility] diabetes app. through [facility].    Gentry Fitz, RN, BA, [place], [place] Direct Dial:  [phone]  Fax:  [phone] E-mail: [email or web address] [address] [place], [place]  [address]

## 2017-08-27 DIAGNOSIS — E1165 Type 2 diabetes mellitus with hyperglycemia: Secondary | ICD-10-CM | POA: Diagnosis not present

## 2017-08-27 DIAGNOSIS — E559 Vitamin D deficiency, unspecified: Secondary | ICD-10-CM | POA: Diagnosis not present

## 2017-08-28 DIAGNOSIS — Z01419 Encounter for gynecological examination (general) (routine) without abnormal findings: Secondary | ICD-10-CM | POA: Diagnosis not present

## 2017-08-28 DIAGNOSIS — Z8041 Family history of malignant neoplasm of ovary: Secondary | ICD-10-CM | POA: Diagnosis not present

## 2017-08-28 DIAGNOSIS — Z124 Encounter for screening for malignant neoplasm of cervix: Secondary | ICD-10-CM | POA: Diagnosis not present

## 2017-08-28 DIAGNOSIS — R300 Dysuria: Secondary | ICD-10-CM | POA: Diagnosis not present

## 2017-08-28 DIAGNOSIS — Z803 Family history of malignant neoplasm of breast: Secondary | ICD-10-CM | POA: Diagnosis not present

## 2017-08-28 DIAGNOSIS — Z8042 Family history of malignant neoplasm of prostate: Secondary | ICD-10-CM | POA: Diagnosis not present

## 2017-08-28 DIAGNOSIS — N760 Acute vaginitis: Secondary | ICD-10-CM | POA: Diagnosis not present

## 2017-08-28 DIAGNOSIS — Z8049 Family history of malignant neoplasm of other genital organs: Secondary | ICD-10-CM | POA: Diagnosis not present

## 2017-08-29 DIAGNOSIS — F411 Generalized anxiety disorder: Secondary | ICD-10-CM | POA: Diagnosis not present

## 2017-08-29 DIAGNOSIS — F3341 Major depressive disorder, recurrent, in partial remission: Secondary | ICD-10-CM | POA: Diagnosis not present

## 2017-08-29 DIAGNOSIS — E119 Type 2 diabetes mellitus without complications: Secondary | ICD-10-CM | POA: Diagnosis not present

## 2017-08-30 ENCOUNTER — Encounter: Payer: Self-pay | Admitting: Physician Assistant

## 2017-09-27 ENCOUNTER — Ambulatory Visit: Payer: 59 | Admitting: Physician Assistant

## 2017-09-27 ENCOUNTER — Encounter: Payer: Self-pay | Admitting: Physician Assistant

## 2017-09-27 DIAGNOSIS — G43009 Migraine without aura, not intractable, without status migrainosus: Secondary | ICD-10-CM

## 2017-09-27 MED ORDER — TOPIRAMATE 100 MG PO TABS: 100.0000 mg | ORAL_TABLET | Freq: Every day | ORAL | 6 refills | Status: DC

## 2017-09-27 MED ORDER — PROMETHAZINE HCL 25 MG PO TABS: 25.0000 mg | ORAL_TABLET | Freq: Four times a day (QID) | ORAL | 0 refills | Status: DC | PRN

## 2017-09-27 MED ORDER — BACLOFEN 10 MG PO TABS: 10.0000 mg | ORAL_TABLET | Freq: Every day | ORAL | 1 refills | Status: DC | PRN

## 2017-09-27 MED ORDER — RIZATRIPTAN BENZOATE 10 MG PO TABS: 10.0000 mg | ORAL_TABLET | ORAL | 6 refills | Status: DC | PRN

## 2017-09-27 NOTE — Progress Notes (Signed)
History:  Julie Hansen is a 47 y.o. [id number] who presents to clinic today for headache evaluation.  She restarted Topamax and is using 100mg  daily along vitamin b6.  She feels this is helpful and her only side effect is numbness of her right great toe.  Pt does note that after her last visit with me, she became pregnant and had a subsequent miscarriage.  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  She is planning for an IUD soon.      Past Medical History:  Diagnosis Date  . Acid reflux   . Asthma   . Diabetes mellitus without complication ([place])   . Hyperlipidemia   . PCOS (polycystic ovarian syndrome)   . PONV (postoperative nausea and vomiting)     Social History   Socioeconomic History  . Marital status: Married    Spouse name: Not on file  . Number of children: Not on file  . Years of education: Not on file  . Highest education level: Not on file  Social Needs  . Financial resource strain: Not on file  . Food insecurity - worry: Not on file  . Food insecurity - inability: Not on file  . Transportation needs - medical: Not on file  . Transportation needs - non-medical: Not on file  Occupational History  . Not on file  Tobacco Use  . Smoking status: Never Smoker  . Smokeless tobacco: Never Used  Substance and Sexual Activity  . Alcohol use: Yes    Alcohol/week: 3.6 oz    Types: 6 Standard drinks or equivalent per week    Comment: 1-2 every 2-3 weeks  . Drug use: No  . Sexual activity: Yes    Birth control/protection: None  Other Topics Concern  . Not on file  Social History Narrative  . Not on file    Family History  Problem Relation Age of Onset  . Cancer Mother   . Hypertension Mother   . Diabetes Mother   . Breast cancer Mother 93  . Cancer Maternal Grandmother   . Breast cancer Maternal Grandmother 31  . Cancer Father   . Diabetes Father   . Hypertension Father   . Breast cancer Paternal Aunt 6  . Breast cancer  Paternal Aunt 46  . Breast cancer Paternal Aunt 31    Allergies  Allergen Reactions  . Hydrocodone-Homatropine Itching  . Macrodantin [Nitrofurantoin Macrocrystal] Itching  . Carbamazepine Other (See Comments)    Vaginal discharge    Current Outpatient Medications on File Prior to Visit  Medication Sig Dispense Refill  . albuterol (PROVENTIL) (2.5 MG/3ML) 0.083% nebulizer solution Take 2.5 mg by nebulization every 6 (six) hours as needed.    . ALPRAZolam (XANAX) 1 MG tablet Take 0.5 mg by mouth 2 (two) times daily as needed for anxiety.     Marland Kitchen atorvastatin (LIPITOR) 10 MG tablet Take 10 mg by mouth daily.    . baclofen (LIORESAL) 10 MG tablet Take 1 tablet (10 mg total) by mouth daily as needed. 30 each 1  . beclomethasone (QVAR) 40 MCG/ACT inhaler Inhale 2 puffs into the lungs 2 (two) times daily. prn    . glimepiride (AMARYL) 4 MG tablet Take 2 mg by mouth 2 (two) times daily.     . lansoprazole (PREVACID) 30 MG capsule Take 30 mg by mouth 2 (two) times daily before a meal.     . lisinopril (PRINIVIL,ZESTRIL) 5 MG tablet Take 5 mg by mouth  daily.    . promethazine (PHENERGAN) 25 MG tablet Take 1 tablet (25 mg total) by mouth every 6 (six) hours as needed for nausea or vomiting. 30 tablet 0  . ranitidine (ZANTAC) 300 MG tablet Take 300 mg by mouth at bedtime.    . rizatriptan (MAXALT) 10 MG tablet Take 1 tablet (10 mg total) by mouth as needed for migraine. May repeat in 2 hours if needed 12 tablet 4  . SUMAtriptan (IMITREX) 100 MG tablet   3  . topiramate (TOPAMAX) 100 MG tablet Take 1 tablet (100 mg total) by mouth daily. 30 tablet 4  . zolpidem (AMBIEN) 10 MG tablet Take 10 mg by mouth at bedtime as needed. Reported on 10/17/2015    . canagliflozin (INVOKANA) 300 MG TABS tablet Take by mouth.    Marland Kitchen HYDROcodone-acetaminophen (NORCO/VICODIN) 5-325 MG tablet Take 1-2 tablets by mouth every 6 (six) hours as needed for moderate pain. (Patient not taking: Reported on 07/15/2017) 30 tablet 0   . ibuprofen (ADVIL,MOTRIN) 800 MG tablet Take 1 tablet (800 mg total) by mouth every 8 (eight) hours as needed. (Patient not taking: Reported on 07/15/2017) 60 tablet 3  . Insulin NPH, Human,, Isophane, (HUMULIN N KWIKPEN) 100 UNIT/ML Kiwkpen Inject 30 Units into the skin daily after supper.    . metFORMIN (GLUCOPHAGE) 1000 MG tablet Take 500 mg by mouth 2 (two) times daily with a meal. Reported on 12/27/2015    . promethazine (PHENERGAN) 12.5 MG tablet Take 1 tablet (12.5 mg total) by mouth every 6 (six) hours as needed for nausea or vomiting. (Patient not taking: Reported on 07/15/2017) 30 tablet 0   No current facility-administered medications on file prior to visit.      Review of Systems:  All pertinent positive/negative included in HPI, all other review of systems are negative   Objective:  Physical Exam BP 91/61   Pulse 96   Wt 205 lb 3.2 oz (93.1 kg)   LMP 09/05/2017   BMI 35.22 kg/m  CONSTITUTIONAL: Well-developed, well-nourished female in no acute distress.  EYES: EOM intact ENT: Normocephalic CARDIOVASCULAR: Regular rate  RESPIRATORY: Normal rate ENDOCRINE: Normal thyroid.  MUSCULOSKELETAL: Normal ROM SKIN: Warm, dry without erythema  NEUROLOGICAL: Alert, oriented, CN II-XII grossly intact, Appropriate balance PSYCH: Normal behavior, mood   Assessment & Plan:  Assessment: 1. Migraine without aura and without status migrainosus, not intractable      Plan: Pt advised - Topamax is not safe to use during pregnancy.  She must take appropriate action to avoid pregnancy or come off the Topamax.  She assures me she will use condoms until getting her IUD.   Will continue Topamax as pt has seen good results with this.   Will continue Maxalt as that has been an improvement over Imitrex.   Pt advised to use her acute medications early!  She has been avoiding use until too late.  She is also encouraged to use ibuprofen to enhance maxalt.   Follow-up in 6 months or sooner  PRN  Paticia Stack, PA-C 09/27/2017 10:03 AM

## 2017-09-27 NOTE — Patient Instructions (Signed)

## 2017-10-01 DIAGNOSIS — R140 Abdominal distension (gaseous): Secondary | ICD-10-CM | POA: Diagnosis not present

## 2017-10-01 DIAGNOSIS — K5904 Chronic idiopathic constipation: Secondary | ICD-10-CM | POA: Diagnosis not present

## 2017-10-01 DIAGNOSIS — K219 Gastro-esophageal reflux disease without esophagitis: Secondary | ICD-10-CM | POA: Diagnosis not present

## 2017-10-01 DIAGNOSIS — Z1211 Encounter for screening for malignant neoplasm of colon: Secondary | ICD-10-CM | POA: Diagnosis not present

## 2017-10-10 DIAGNOSIS — E1165 Type 2 diabetes mellitus with hyperglycemia: Secondary | ICD-10-CM | POA: Diagnosis not present

## 2017-10-10 DIAGNOSIS — E559 Vitamin D deficiency, unspecified: Secondary | ICD-10-CM | POA: Diagnosis not present

## 2017-10-17 DIAGNOSIS — E559 Vitamin D deficiency, unspecified: Secondary | ICD-10-CM | POA: Diagnosis not present

## 2017-10-17 DIAGNOSIS — E1165 Type 2 diabetes mellitus with hyperglycemia: Secondary | ICD-10-CM | POA: Diagnosis not present

## 2017-10-18 DIAGNOSIS — D128 Benign neoplasm of rectum: Secondary | ICD-10-CM | POA: Diagnosis not present

## 2017-10-18 DIAGNOSIS — K621 Rectal polyp: Secondary | ICD-10-CM | POA: Diagnosis not present

## 2017-10-18 DIAGNOSIS — Z1211 Encounter for screening for malignant neoplasm of colon: Secondary | ICD-10-CM | POA: Diagnosis not present

## 2017-10-29 ENCOUNTER — Other Ambulatory Visit: Payer: Self-pay | Admitting: Internal Medicine

## 2017-10-29 DIAGNOSIS — Z1231 Encounter for screening mammogram for malignant neoplasm of breast: Secondary | ICD-10-CM

## 2017-11-15 ENCOUNTER — Ambulatory Visit
Admission: RE | Admit: 2017-11-15 | Discharge: 2017-11-15 | Disposition: A | Discharge status: Home / Self Care | Payer: 59 | Source: Ambulatory Visit | Attending: Internal Medicine | Admitting: Internal Medicine

## 2017-11-15 DIAGNOSIS — Z1231 Encounter for screening mammogram for malignant neoplasm of breast: Secondary | ICD-10-CM

## 2017-11-18 ENCOUNTER — Other Ambulatory Visit: Payer: Self-pay

## 2017-11-18 ENCOUNTER — Other Ambulatory Visit: Payer: Self-pay | Admitting: Internal Medicine

## 2017-11-18 DIAGNOSIS — N63 Unspecified lump in unspecified breast: Secondary | ICD-10-CM

## 2017-11-19 DIAGNOSIS — G4733 Obstructive sleep apnea (adult) (pediatric): Secondary | ICD-10-CM | POA: Diagnosis not present

## 2017-11-20 ENCOUNTER — Ambulatory Visit
Admission: RE | Admit: 2017-11-20 | Discharge: 2017-11-20 | Disposition: A | Discharge status: Home / Self Care | Payer: 59 | Source: Ambulatory Visit | Attending: Internal Medicine | Admitting: Internal Medicine

## 2017-11-20 DIAGNOSIS — R928 Other abnormal and inconclusive findings on diagnostic imaging of breast: Secondary | ICD-10-CM | POA: Diagnosis not present

## 2017-11-20 DIAGNOSIS — N63 Unspecified lump in unspecified breast: Secondary | ICD-10-CM

## 2017-11-20 DIAGNOSIS — N6310 Unspecified lump in the right breast, unspecified quadrant: Secondary | ICD-10-CM | POA: Diagnosis not present

## 2017-11-20 IMAGING — MG DIGITAL DIAGNOSTIC BILATERAL MAMMOGRAM WITH TOMO AND CAD
6 of 10 series · 6 of 30 positions shown · non-contrast
Comparison: Previous exam(s).

CLINICAL DATA: Palpable lump in the right axilla

EXAM:
DIGITAL DIAGNOSTIC BILATERAL MAMMOGRAM WITH CAD AND TOMO
ULTRASOUND RIGHT BREAST

[R MLO synth-2D]
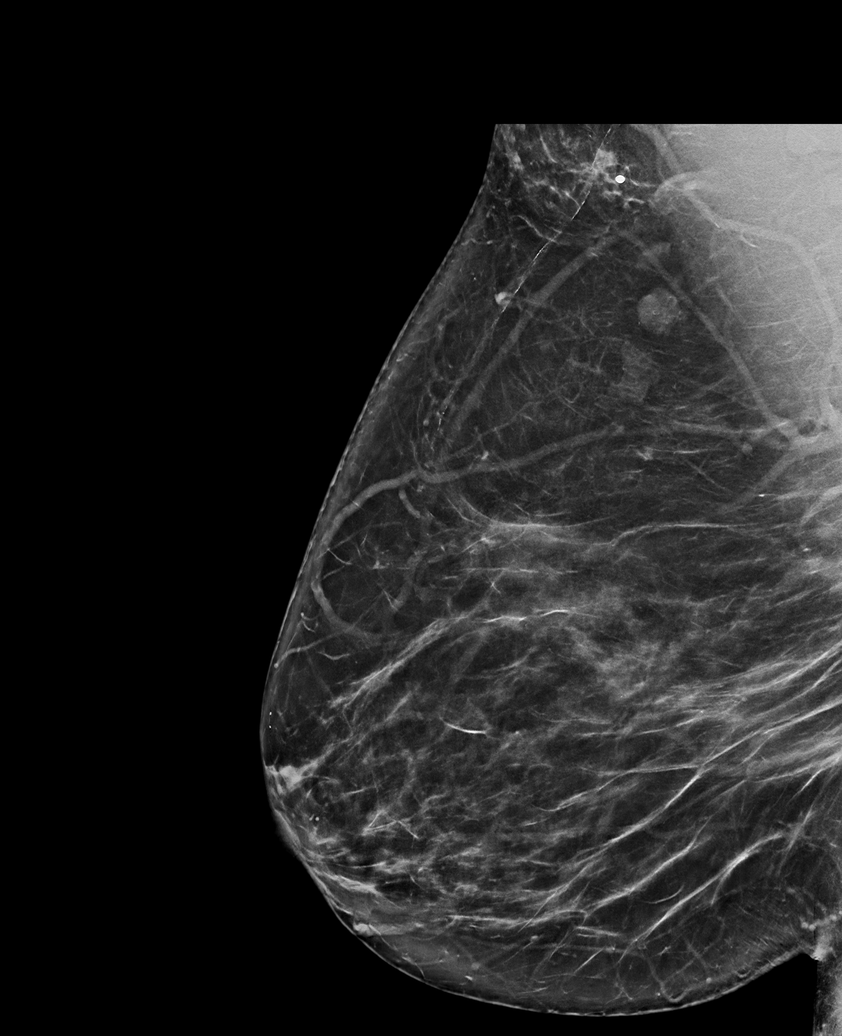

[L CC synth-2D]
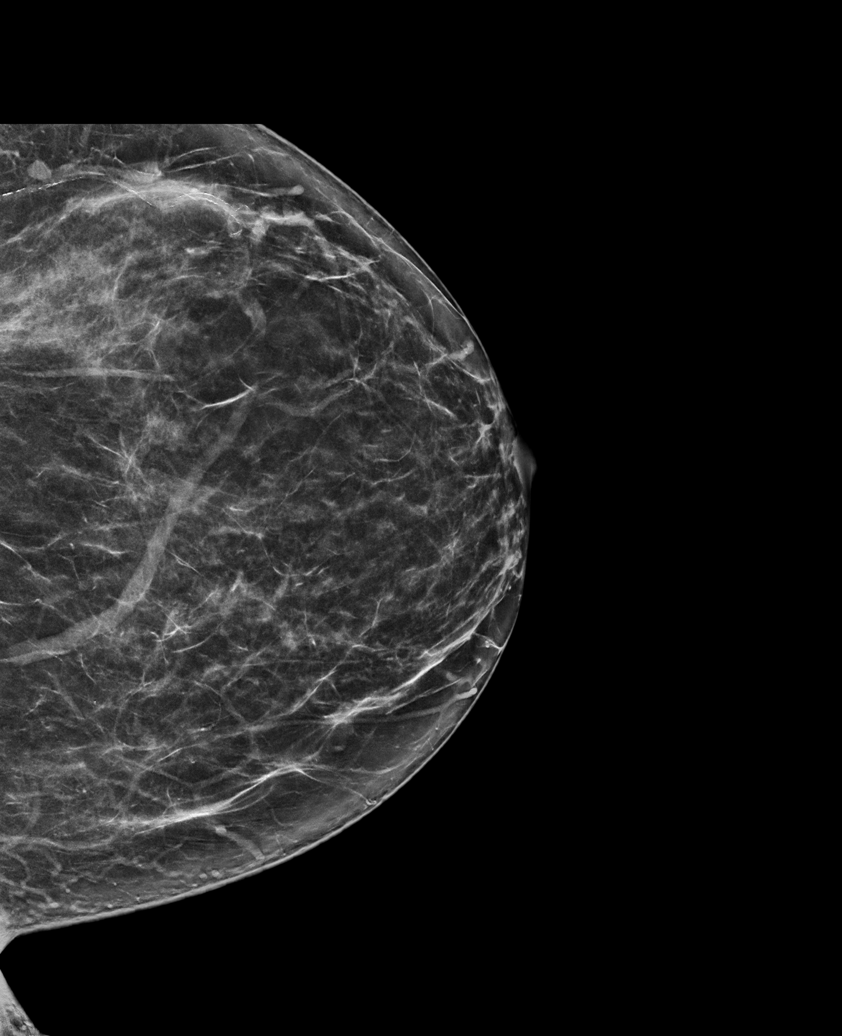

[L MLO synth-2D]
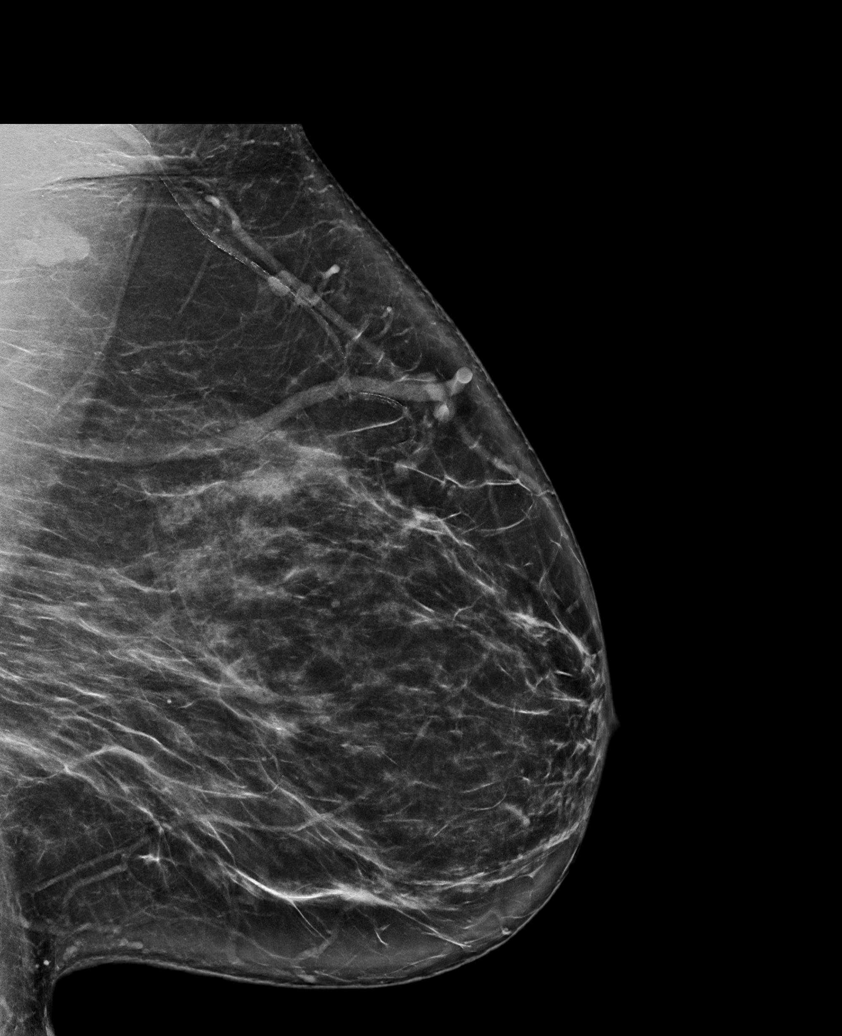

[R TAN synth-2D]
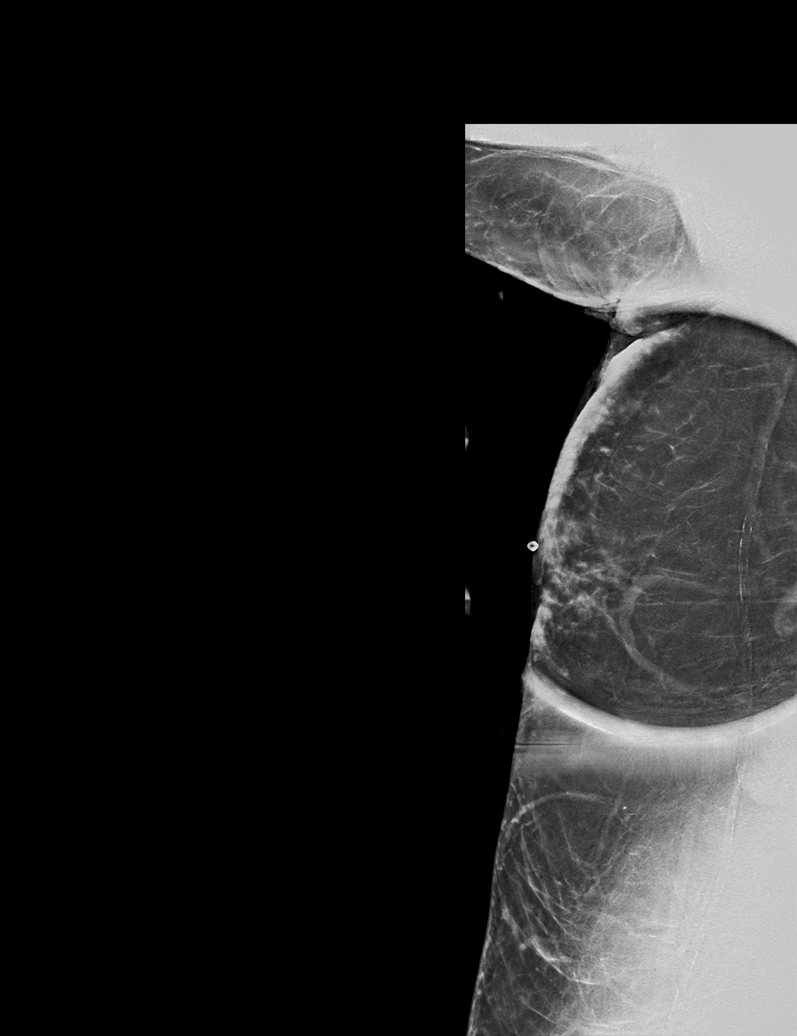

[R CC synth-2D]
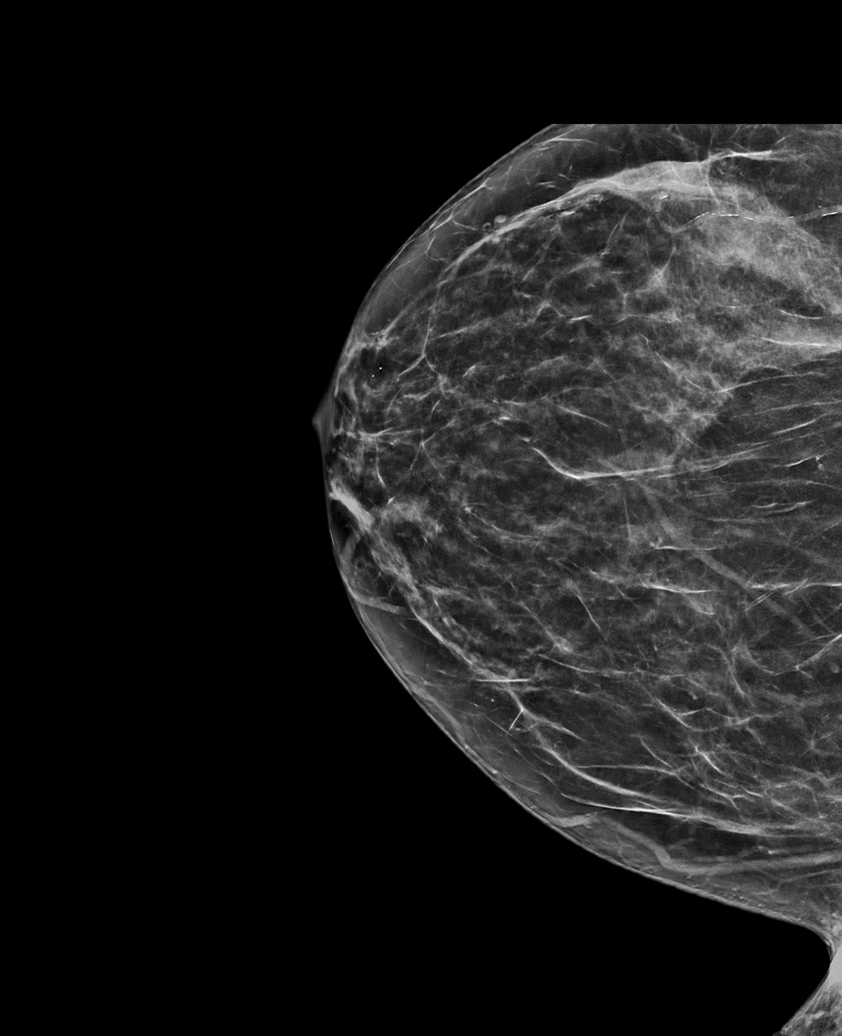

[R TAN tomo · tomo slice 37/72.0]
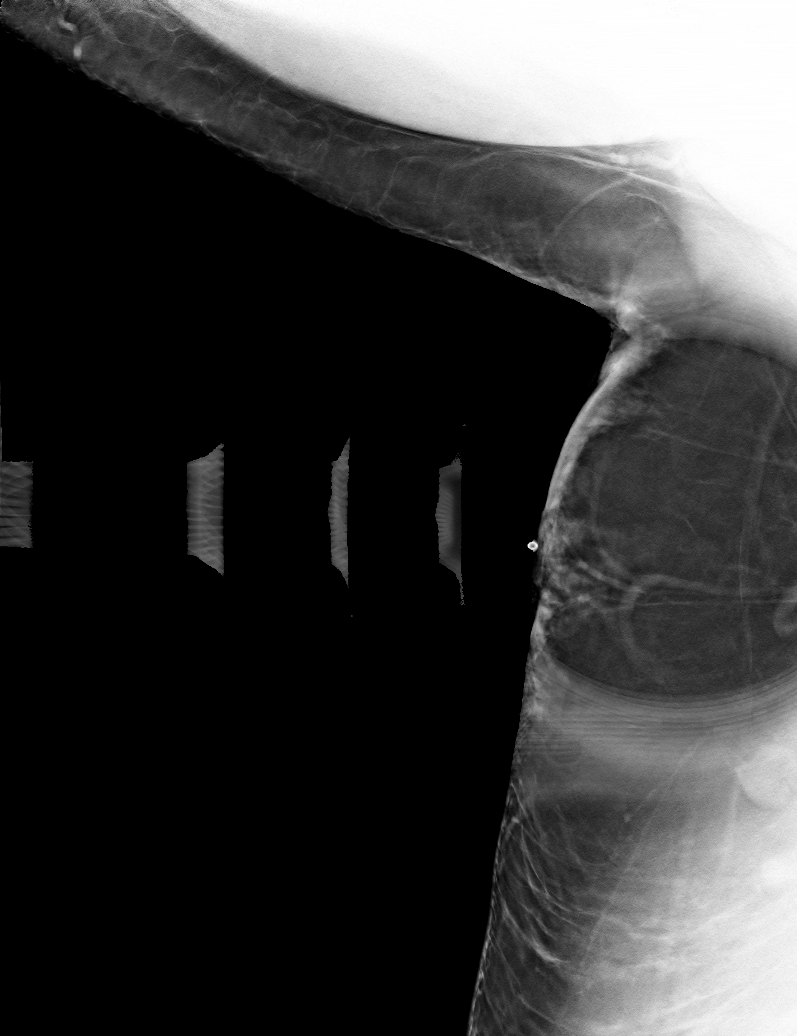

[6 of 30 positions shown; findings below may reference images not displayed]

ACR Breast Density Category b: There are scattered areas of
fibroglandular density.
FINDINGS: The patient appears to be feeling an island of glandular tissue
which spreads out on spot imaging. No other suspicious findings.

Mammographic images were processed with CAD.

On physical exam, no suspicious lumps identified.

Targeted ultrasound is performed, showing an island of glandular
tissue in the region of the patient's symptoms.
IMPRESSION: The patient appears to be palpating an island of glandular tissue.
No evidence of malignancy.

RECOMMENDATION:
Annual screening mammography.

I have discussed the findings and recommendations with the patient.
Results were also provided in writing at the conclusion of the
visit. If applicable, a reminder letter will be sent to the patient
regarding the next appointment.

BI-RADS CATEGORY  2: Benign.

## 2017-12-03 DIAGNOSIS — G4733 Obstructive sleep apnea (adult) (pediatric): Secondary | ICD-10-CM | POA: Diagnosis not present

## 2017-12-18 DIAGNOSIS — F411 Generalized anxiety disorder: Secondary | ICD-10-CM | POA: Diagnosis not present

## 2017-12-18 DIAGNOSIS — F3181 Bipolar II disorder: Secondary | ICD-10-CM | POA: Diagnosis not present

## 2018-01-30 DIAGNOSIS — K219 Gastro-esophageal reflux disease without esophagitis: Secondary | ICD-10-CM | POA: Diagnosis not present

## 2018-01-30 DIAGNOSIS — K5904 Chronic idiopathic constipation: Secondary | ICD-10-CM | POA: Diagnosis not present

## 2018-01-30 DIAGNOSIS — R142 Eructation: Secondary | ICD-10-CM | POA: Diagnosis not present

## 2018-02-05 DIAGNOSIS — E559 Vitamin D deficiency, unspecified: Secondary | ICD-10-CM | POA: Diagnosis not present

## 2018-02-05 DIAGNOSIS — E669 Obesity, unspecified: Secondary | ICD-10-CM | POA: Diagnosis not present

## 2018-02-05 DIAGNOSIS — E1165 Type 2 diabetes mellitus with hyperglycemia: Secondary | ICD-10-CM | POA: Diagnosis not present

## 2018-02-06 ENCOUNTER — Other Ambulatory Visit
Admission: RE | Admit: 2018-02-06 | Discharge: 2018-02-06 | Disposition: A | Discharge status: Home / Self Care | Payer: 59 | Source: Ambulatory Visit | Attending: Internal Medicine | Admitting: Internal Medicine

## 2018-02-06 DIAGNOSIS — E1165 Type 2 diabetes mellitus with hyperglycemia: Secondary | ICD-10-CM | POA: Insufficient documentation

## 2018-02-06 DIAGNOSIS — E559 Vitamin D deficiency, unspecified: Secondary | ICD-10-CM | POA: Insufficient documentation

## 2018-02-06 LAB — BASIC METABOLIC PANEL
Anion gap: 10 (ref 5–15)
BUN: 15 mg/dL (ref 6–20)
CHLORIDE: 103 mmol/L (ref 101–111)
CO2: 22 mmol/L (ref 22–32)
CREATININE: 0.91 mg/dL (ref 0.44–1.00)
Calcium: 9 mg/dL (ref 8.9–10.3)
GFR calc Af Amer: >60 mL/min (ref >60)
GFR calc non Af Amer: >60 mL/min (ref >60)
Glucose, Bld: 177 mg/dL — ABNORMAL HIGH (ref 65–99)
Potassium: 3.4 mmol/L — ABNORMAL LOW (ref 3.5–5.1)
Sodium: 135 mmol/L (ref 135–145)

## 2018-02-07 LAB — MICROALBUMIN / CREATININE URINE RATIO
CREATININE, UR: 115.9 mg/dL
MICROALB UR: 4.3 ug/mL — AB
MICROALB/CREAT RATIO: 3.7 mg/g{creat} (ref 0.0–30.0)

## 2018-02-07 LAB — VITAMIN D 25 HYDROXY (VIT D DEFICIENCY, FRACTURES): VIT D 25 HYDROXY: 23 ng/mL — AB (ref 30.0–100.0)

## 2018-02-07 LAB — HEMOGLOBIN A1C
Hgb A1c MFr Bld: 7.8 % — ABNORMAL HIGH (ref 4.8–5.6)
Mean Plasma Glucose: 177 mg/dL

## 2018-02-10 DIAGNOSIS — E559 Vitamin D deficiency, unspecified: Secondary | ICD-10-CM | POA: Diagnosis not present

## 2018-02-10 DIAGNOSIS — E782 Mixed hyperlipidemia: Secondary | ICD-10-CM | POA: Diagnosis not present

## 2018-02-10 DIAGNOSIS — E119 Type 2 diabetes mellitus without complications: Secondary | ICD-10-CM | POA: Diagnosis not present

## 2018-02-10 DIAGNOSIS — I10 Essential (primary) hypertension: Secondary | ICD-10-CM | POA: Diagnosis not present

## 2018-02-18 DIAGNOSIS — G4733 Obstructive sleep apnea (adult) (pediatric): Secondary | ICD-10-CM | POA: Diagnosis not present

## 2018-02-24 DIAGNOSIS — G4733 Obstructive sleep apnea (adult) (pediatric): Secondary | ICD-10-CM | POA: Diagnosis not present

## 2018-02-25 ENCOUNTER — Other Ambulatory Visit: Payer: Self-pay | Admitting: Gastroenterology

## 2018-02-25 DIAGNOSIS — R140 Abdominal distension (gaseous): Secondary | ICD-10-CM

## 2018-03-06 ENCOUNTER — Ambulatory Visit
Admission: RE | Admit: 2018-03-06 | Discharge: 2018-03-06 | Disposition: A | Discharge status: Home / Self Care | Payer: 59 | Source: Ambulatory Visit | Attending: Gastroenterology | Admitting: Gastroenterology

## 2018-03-06 DIAGNOSIS — D259 Leiomyoma of uterus, unspecified: Secondary | ICD-10-CM | POA: Insufficient documentation

## 2018-03-06 DIAGNOSIS — R140 Abdominal distension (gaseous): Secondary | ICD-10-CM | POA: Diagnosis not present

## 2018-03-06 DIAGNOSIS — D251 Intramural leiomyoma of uterus: Secondary | ICD-10-CM | POA: Diagnosis not present

## 2018-03-06 IMAGING — US US PELVIS COMPLETE
1 series · 14 of 25 positions shown · non-contrast
Comparison: None

CLINICAL DATA: Initial evaluation for gas and bloating.



[Series 1: us pelvis complete · 14 of 82 slices shown]
[im 1/82]
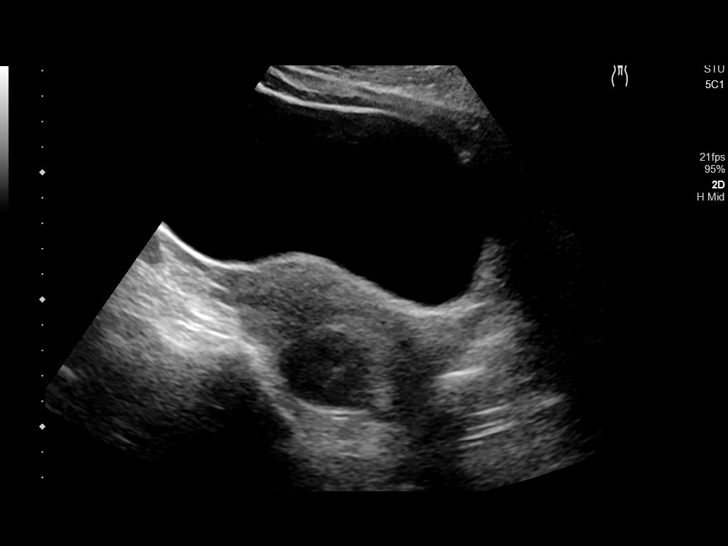
[im 7/82]
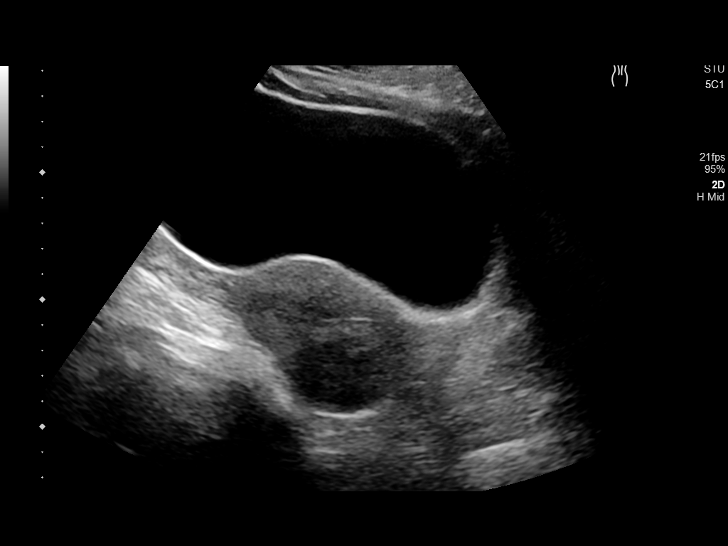
[im 14/82]
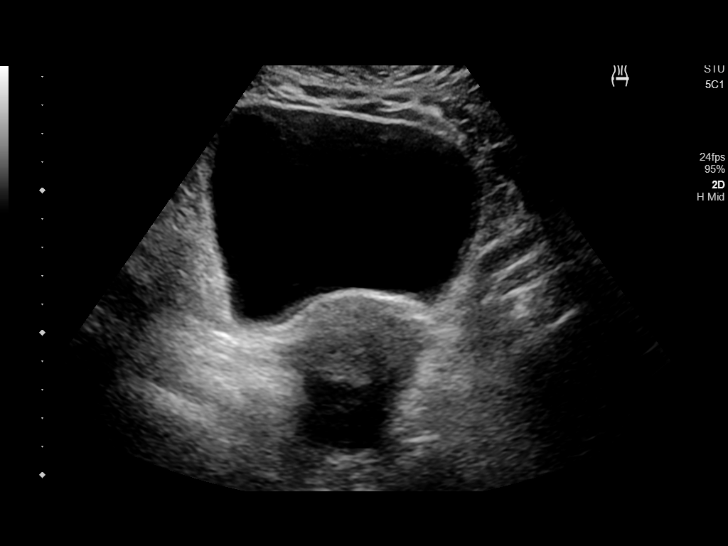
[im 21/82]
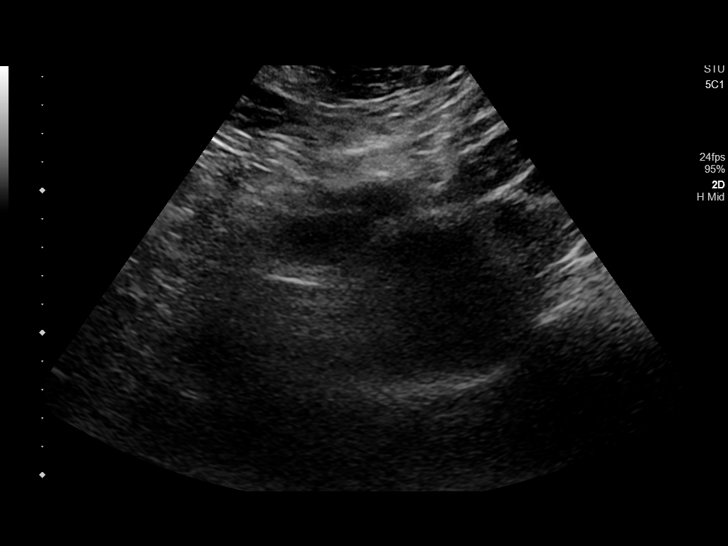
[im 28/82]
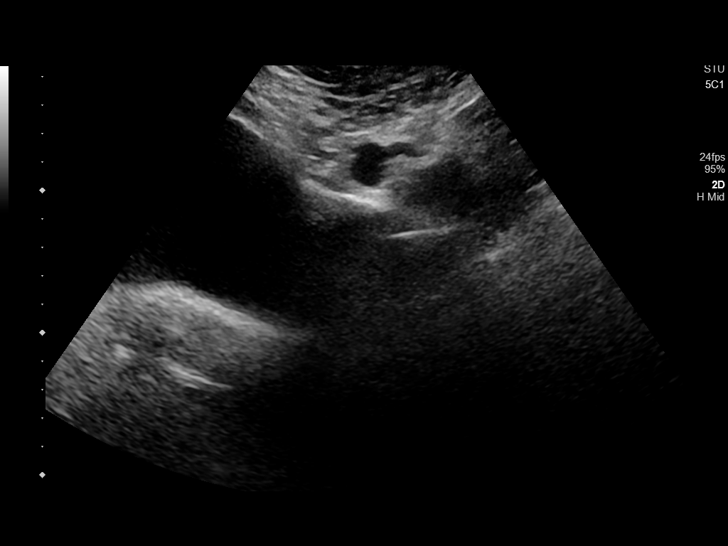
[im 31/82]
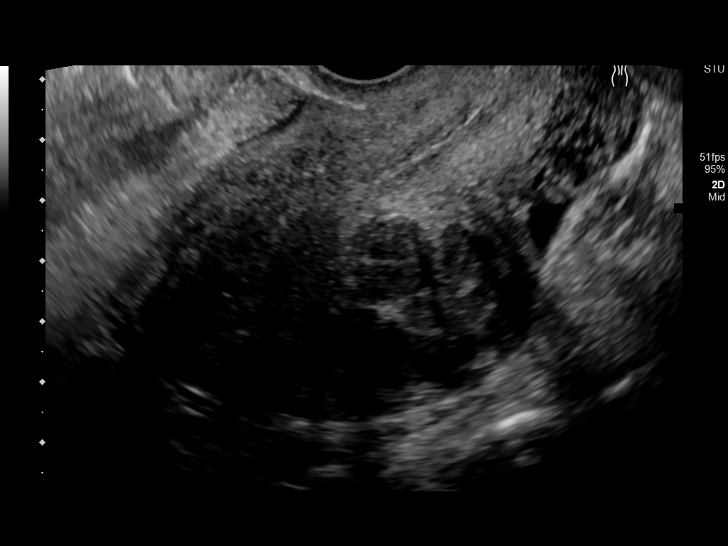
[im 38/82]
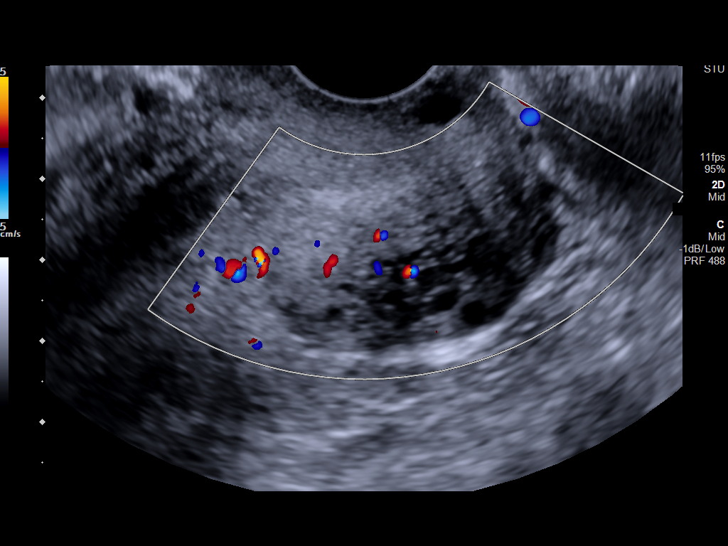
[im 44/82]
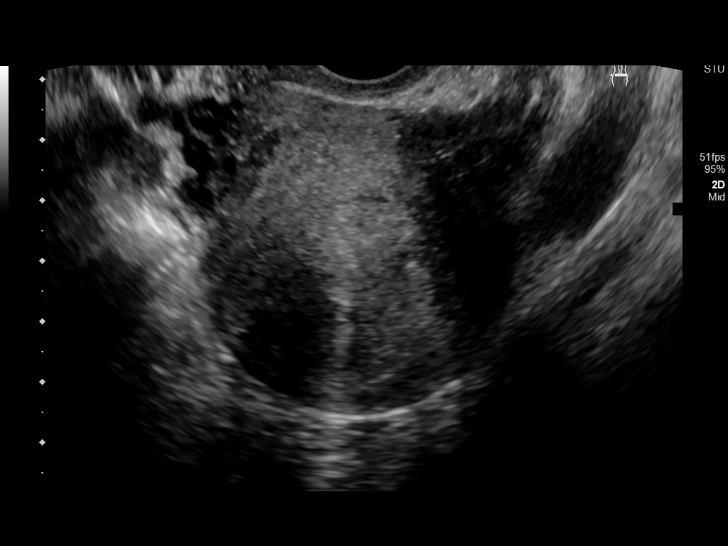
[im 51/82]
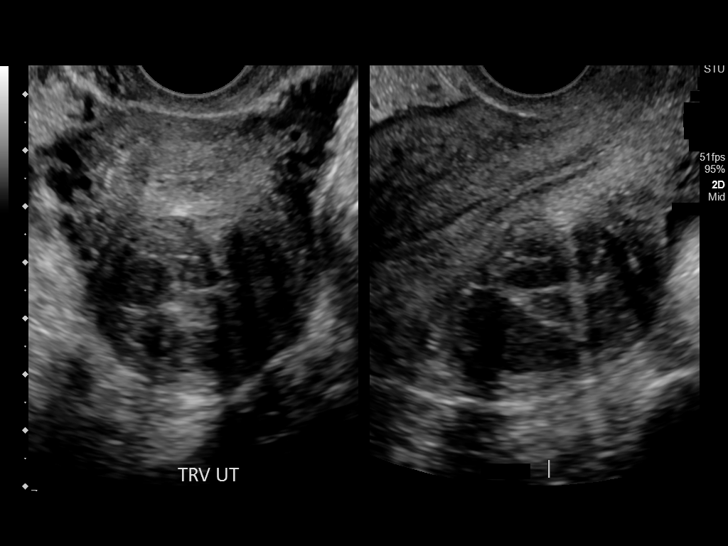
[im 55/82]
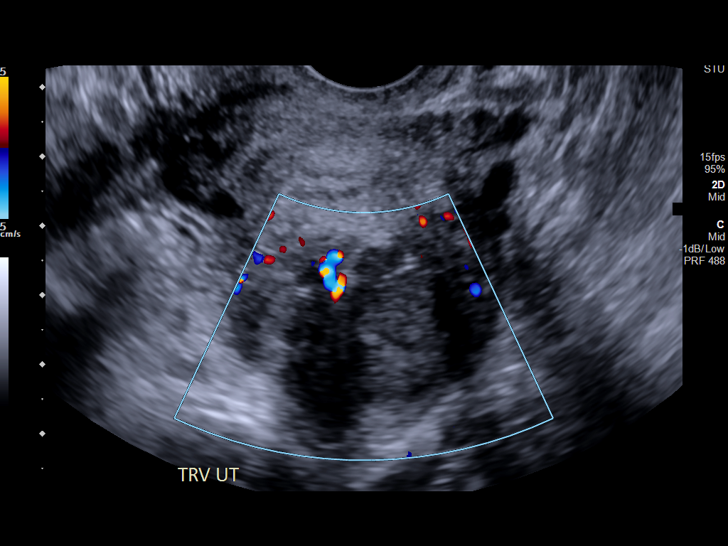
[im 61/82]
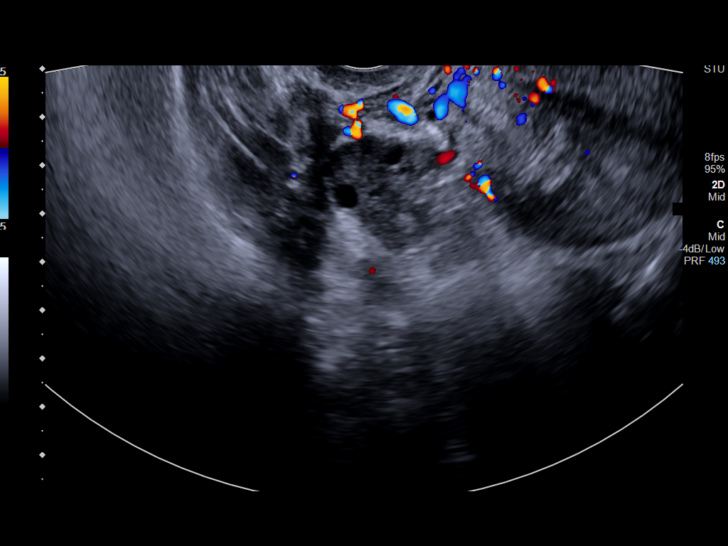
[im 68/82]
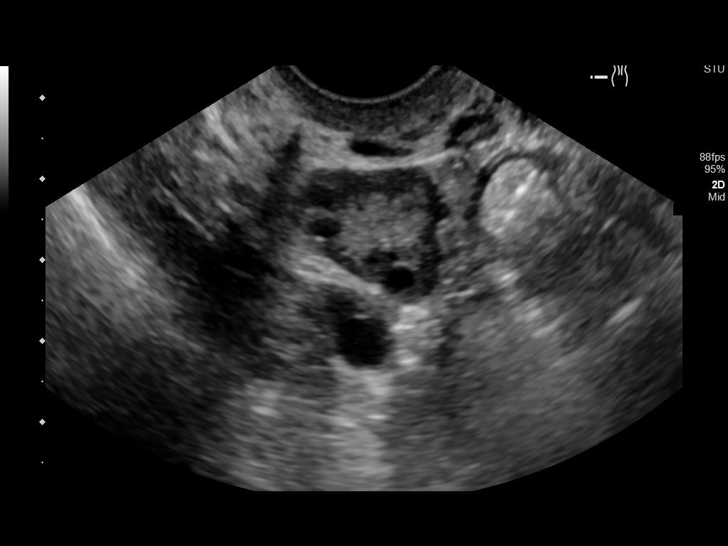
[im 75/82]
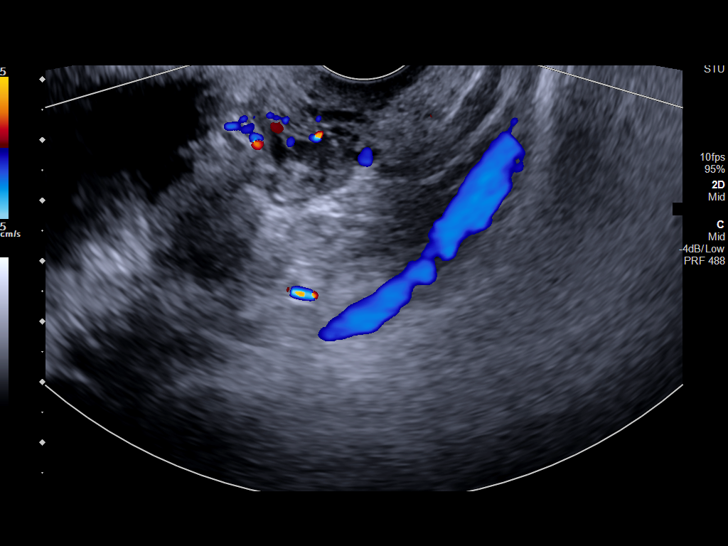
[im 82/82]
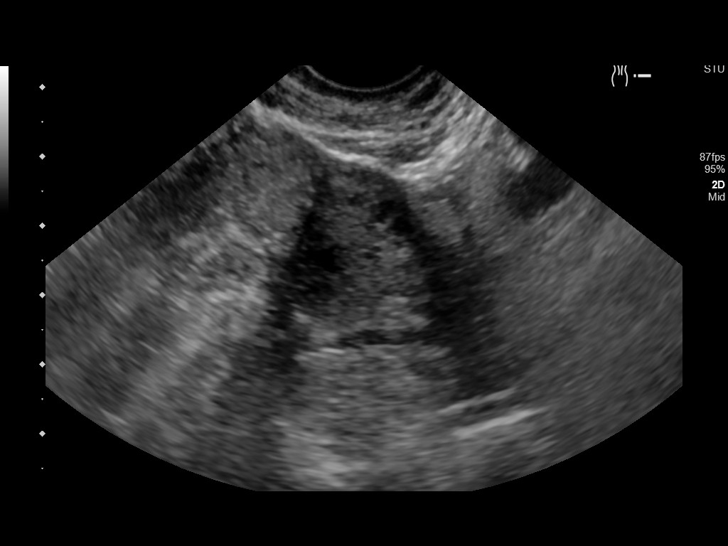

[14 of 25 positions shown; findings below may reference images not displayed]

FINDINGS: Uterus

Measurements: 9.6 x 5.8 x 5.2 cm. Heterogeneous and partially
calcified intramural fibroid measuring 3.8 x 2.5 x 3.4 cm present at
the left posterior uterine body.

Endometrium

Thickness: 4.7 mm.  No focal abnormality visualized.

Right ovary

Measurements: 4.4 x 2.2 x 2.0 cm. Normal appearance/no adnexal mass.

Left ovary

Measurements: 3.8 x 2.2 x 2.5 cm. Normal appearance/no adnexal mass.

Other findings

Trace free physiologic fluid within the pelvis.
IMPRESSION: 1. 3.8 cm posterior uterine fibroid.
2. Otherwise unremarkable and normal pelvic ultrasound. No other
pelvic or adnexal mass identified.

## 2018-03-19 DIAGNOSIS — F3181 Bipolar II disorder: Secondary | ICD-10-CM | POA: Diagnosis not present

## 2018-03-19 DIAGNOSIS — F411 Generalized anxiety disorder: Secondary | ICD-10-CM | POA: Diagnosis not present

## 2018-04-04 DIAGNOSIS — J0100 Acute maxillary sinusitis, unspecified: Secondary | ICD-10-CM | POA: Diagnosis not present

## 2018-04-30 ENCOUNTER — Other Ambulatory Visit: Payer: Self-pay | Admitting: Physician Assistant

## 2018-04-30 DIAGNOSIS — G43009 Migraine without aura, not intractable, without status migrainosus: Secondary | ICD-10-CM

## 2018-05-07 DIAGNOSIS — F411 Generalized anxiety disorder: Secondary | ICD-10-CM | POA: Diagnosis not present

## 2018-05-07 DIAGNOSIS — F3181 Bipolar II disorder: Secondary | ICD-10-CM | POA: Diagnosis not present

## 2018-05-15 ENCOUNTER — Other Ambulatory Visit: Payer: Self-pay

## 2018-05-15 DIAGNOSIS — G43009 Migraine without aura, not intractable, without status migrainosus: Secondary | ICD-10-CM

## 2018-05-15 MED ORDER — BACLOFEN 10 MG PO TABS: 10.0000 mg | ORAL_TABLET | ORAL | 0 refills | Status: DC | PRN

## 2018-05-15 MED ORDER — PROMETHAZINE HCL 25 MG PO TABS: 25.0000 mg | ORAL_TABLET | Freq: Four times a day (QID) | ORAL | 2 refills | Status: DC | PRN

## 2018-05-19 DIAGNOSIS — G4733 Obstructive sleep apnea (adult) (pediatric): Secondary | ICD-10-CM | POA: Diagnosis not present

## 2018-06-09 DIAGNOSIS — R0602 Shortness of breath: Secondary | ICD-10-CM | POA: Diagnosis not present

## 2018-06-09 DIAGNOSIS — F419 Anxiety disorder, unspecified: Secondary | ICD-10-CM | POA: Diagnosis not present

## 2018-06-09 DIAGNOSIS — L659 Nonscarring hair loss, unspecified: Secondary | ICD-10-CM | POA: Diagnosis not present

## 2018-06-09 DIAGNOSIS — R072 Precordial pain: Secondary | ICD-10-CM | POA: Diagnosis not present

## 2018-06-10 DIAGNOSIS — F411 Generalized anxiety disorder: Secondary | ICD-10-CM | POA: Diagnosis not present

## 2018-06-10 DIAGNOSIS — F3181 Bipolar II disorder: Secondary | ICD-10-CM | POA: Diagnosis not present

## 2018-06-11 ENCOUNTER — Other Ambulatory Visit
Admission: RE | Admit: 2018-06-11 | Discharge: 2018-06-11 | Disposition: A | Discharge status: Home / Self Care | Payer: 59 | Source: Ambulatory Visit | Attending: Internal Medicine | Admitting: Internal Medicine

## 2018-06-11 DIAGNOSIS — E1165 Type 2 diabetes mellitus with hyperglycemia: Secondary | ICD-10-CM | POA: Insufficient documentation

## 2018-06-11 DIAGNOSIS — E559 Vitamin D deficiency, unspecified: Secondary | ICD-10-CM | POA: Insufficient documentation

## 2018-06-11 DIAGNOSIS — R5383 Other fatigue: Secondary | ICD-10-CM | POA: Insufficient documentation

## 2018-06-11 LAB — BASIC METABOLIC PANEL
Anion gap: 7 (ref 5–15)
BUN: 15 mg/dL (ref 6–20)
CALCIUM: 9.1 mg/dL (ref 8.9–10.3)
CO2: 25 mmol/L (ref 22–32)
Chloride: 105 mmol/L (ref 98–111)
Creatinine, Ser: 0.75 mg/dL (ref 0.44–1.00)
GFR calc Af Amer: >60 mL/min (ref >60)
Glucose, Bld: 166 mg/dL — ABNORMAL HIGH (ref 70–99)
POTASSIUM: 3.7 mmol/L (ref 3.5–5.1)
Sodium: 137 mmol/L (ref 135–145)

## 2018-06-11 LAB — VITAMIN B12: VITAMIN B 12: 476 pg/mL (ref 180–914)

## 2018-06-12 LAB — VITAMIN D 25 HYDROXY (VIT D DEFICIENCY, FRACTURES): Vit D, 25-Hydroxy: 18.4 ng/mL — ABNORMAL LOW (ref 30.0–100.0)

## 2018-06-12 LAB — MICROALBUMIN / CREATININE URINE RATIO
Creatinine, Urine: 63 mg/dL
Microalb Creat Ratio: <4.8 mg/g creat (ref 0.0–30.0)
Microalb, Ur: <3 ug/mL — ABNORMAL HIGH

## 2018-06-12 LAB — HEMOGLOBIN A1C
HEMOGLOBIN A1C: 8 % — AB (ref 4.8–5.6)
Mean Plasma Glucose: 183 mg/dL

## 2018-06-16 DIAGNOSIS — E559 Vitamin D deficiency, unspecified: Secondary | ICD-10-CM | POA: Diagnosis not present

## 2018-06-16 DIAGNOSIS — E1165 Type 2 diabetes mellitus with hyperglycemia: Secondary | ICD-10-CM | POA: Diagnosis not present

## 2018-06-20 ENCOUNTER — Ambulatory Visit: Payer: 59 | Admitting: Physician Assistant

## 2018-06-20 ENCOUNTER — Encounter: Payer: Self-pay | Admitting: Physician Assistant

## 2018-06-20 VITALS — BP 90/60 | HR 97 | Wt 211.2 lb

## 2018-06-20 DIAGNOSIS — G43009 Migraine without aura, not intractable, without status migrainosus: Secondary | ICD-10-CM

## 2018-06-20 DIAGNOSIS — M62838 Other muscle spasm: Secondary | ICD-10-CM

## 2018-06-20 MED ORDER — GALCANEZUMAB-GNLM 120 MG/ML ~~LOC~~ SOAJ: SUBCUTANEOUS | 11 refills | Status: DC

## 2018-06-20 MED ORDER — RIZATRIPTAN BENZOATE 10 MG PO TBDP: 10.0000 mg | ORAL_TABLET | ORAL | 3 refills | Status: DC | PRN

## 2018-06-20 MED ORDER — BACLOFEN 10 MG PO TABS: 10.0000 mg | ORAL_TABLET | Freq: Every day | ORAL | 2 refills | Status: DC | PRN

## 2018-06-20 MED ORDER — TOPIRAMATE 100 MG PO TABS: 100.0000 mg | ORAL_TABLET | Freq: Every day | ORAL | 6 refills | Status: DC

## 2018-06-20 NOTE — Progress Notes (Signed)
History:  Julie Hansen is a 47 y.o. [id number] who presents to clinic today for headaches.  Her headaches were stable with around 4 per month until one week ago.  She is now having many more headaches and not knowing why.  Quality of HA is unchanged.  She has had trouble with palpitations.  The medical workup was good.  She saw a therapist that thought she maybe had more anxiety.  She has started seroquel to help her sleep more.  She has low vitamin D and is taking supplement.   Topamax has been fine but she is not seeing substantial benefit.  She has found maxalt needs to be taken with food and that is hard for her. She previously used imitrex. Topamax and Lisinopril have been used for prevention.  HIT6:72 Number of days in the last 4 weeks with:  Severe headache: 2 Moderate headache: 14 Mild headache: 3  No headache: 9   Past Medical History:  Diagnosis Date  . Acid reflux   . Asthma   . Diabetes mellitus without complication ([place])   . Hyperlipidemia   . PCOS (polycystic ovarian syndrome)   . PONV (postoperative nausea and vomiting)     Social History   Socioeconomic History  . Marital status: Married    Spouse name: Not on file  . Number of children: Not on file  . Years of education: Not on file  . Highest education level: Not on file  Occupational History  . Not on file  Social Needs  . Financial resource strain: Not on file  . Food insecurity:    Worry: Not on file    Inability: Not on file  . Transportation needs:    Medical: Not on file    Non-medical: Not on file  Tobacco Use  . Smoking status: Never Smoker  . Smokeless tobacco: Never Used  Substance and Sexual Activity  . Alcohol use: Yes    Alcohol/week: 6.0 standard drinks    Types: 6 Standard drinks or equivalent per week    Comment: 1-2 every 2-3 weeks  . Drug use: No  . Sexual activity: Yes    Birth control/protection: None  Lifestyle  . Physical activity:    Days per week: Not on file     Minutes per session: Not on file  . Stress: Not on file  Relationships  . Social connections:    Talks on phone: Not on file    Gets together: Not on file    Attends religious service: Not on file    Active member of club or organization: Not on file    Attends meetings of clubs or organizations: Not on file    Relationship status: Not on file  . Intimate partner violence:    Fear of current or ex partner: Not on file    Emotionally abused: Not on file    Physically abused: Not on file    Forced sexual activity: Not on file  Other Topics Concern  . Not on file  Social History Narrative  . Not on file    Family History  Problem Relation Age of Onset  . Cancer Mother   . Hypertension Mother   . Diabetes Mother   . Breast cancer Mother 61  . Cancer Maternal Grandmother   . Breast cancer Maternal Grandmother 72  . Cancer Father   . Diabetes Father   . Hypertension Father   . Breast cancer Paternal Aunt 24  . Breast cancer Paternal Aunt  50  . Breast cancer Paternal Aunt 73    Allergies  Allergen Reactions  . Hydrocodone-Homatropine Itching  . Macrodantin [Nitrofurantoin Macrocrystal] Itching  . Carbamazepine Other (See Comments)    Vaginal discharge    Current Outpatient Medications on File Prior to Visit  Medication Sig Dispense Refill  . albuterol (PROVENTIL) (2.5 MG/3ML) 0.083% nebulizer solution Take 2.5 mg by nebulization every 6 (six) hours as needed.    . ALPRAZolam (XANAX) 1 MG tablet Take 0.5 mg by mouth 2 (two) times daily as needed for anxiety.     Marland Kitchen atorvastatin (LIPITOR) 10 MG tablet Take 10 mg by mouth daily.    . baclofen (LIORESAL) 10 MG tablet TAKE 1 TABLET (10 MG TOTAL) BY MOUTH DAILY AS NEEDED. 30 tablet 1  . baclofen (LIORESAL) 10 MG tablet Take 1 tablet (10 mg total) by mouth as needed for muscle spasms. 30 each 0  . beclomethasone (QVAR) 40 MCG/ACT inhaler Inhale 2 puffs into the lungs 2 (two) times daily. prn    . canagliflozin (INVOKANA) 300 MG  TABS tablet Take by mouth.    Marland Kitchen glimepiride (AMARYL) 4 MG tablet Take 2 mg by mouth 2 (two) times daily.     Marland Kitchen HYDROcodone-acetaminophen (NORCO/VICODIN) 5-325 MG tablet Take 1-2 tablets by mouth every 6 (six) hours as needed for moderate pain. (Patient not taking: Reported on 07/15/2017) 30 tablet 0  . ibuprofen (ADVIL,MOTRIN) 800 MG tablet Take 1 tablet (800 mg total) by mouth every 8 (eight) hours as needed. (Patient not taking: Reported on 07/15/2017) 60 tablet 3  . Insulin NPH, Human,, Isophane, (HUMULIN N KWIKPEN) 100 UNIT/ML Kiwkpen Inject 30 Units into the skin daily after supper.    . lansoprazole (PREVACID) 30 MG capsule Take 30 mg by mouth 2 (two) times daily before a meal.     . lisinopril (PRINIVIL,ZESTRIL) 5 MG tablet Take 5 mg by mouth daily.    . metFORMIN (GLUCOPHAGE) 1000 MG tablet Take 500 mg by mouth 2 (two) times daily with a meal. Reported on 12/27/2015    . promethazine (PHENERGAN) 25 MG tablet TAKE 1 TABLET (25 MG TOTAL) BY MOUTH EVERY 6 (SIX) HOURS AS NEEDED FOR NAUSEA OR VOMITING. 30 tablet 0  . promethazine (PHENERGAN) 25 MG tablet Take 1 tablet (25 mg total) by mouth every 6 (six) hours as needed for nausea or vomiting. 30 tablet 2  . ranitidine (ZANTAC) 300 MG tablet Take 300 mg by mouth at bedtime.    . rizatriptan (MAXALT) 10 MG tablet Take 1 tablet (10 mg total) by mouth as needed for migraine. May repeat in 2 hours if needed 12 tablet 6  . SUMAtriptan (IMITREX) 100 MG tablet   3  . topiramate (TOPAMAX) 100 MG tablet Take 1 tablet (100 mg total) by mouth daily. 30 tablet 6  . zolpidem (AMBIEN) 10 MG tablet Take 10 mg by mouth at bedtime as needed. Reported on 10/17/2015     No current facility-administered medications on file prior to visit.      Review of Systems:  All pertinent positive/negative included in HPI, all other review of systems are negative   Objective:  Physical Exam BP 90/60   Pulse 97   Wt 211 lb 3.2 oz (95.8 kg)   BMI 36.25 kg/m   CONSTITUTIONAL: Well-developed, well-nourished female in no acute distress.  EYES: EOM intact ENT: Normocephalic CARDIOVASCULAR: Regular rate and rhythm with no adventitious sounds.  RESPIRATORY: Normal rate.  MUSCULOSKELETAL: Normal ROM SKIN: Warm, dry without  erythema  NEUROLOGICAL: Alert, oriented, CN II-XII grossly intact, Appropriate balance PSYCH: Normal behavior, mood   Assessment & Plan:  Assessment: 1. Migraine without aura and without status migrainosus, not intractable   2. Muscle spasm     Worsening of migraine  Plan: Will begin Emgality for migraine prevention.  Pt has so many other conditions that may be affected by other older migraine preventive medications and Emgality is likely to help migraine without drug interactions or worsening of medical conditions.  She will begin with 2 injections today (sample provided) and then 1 monthly thereafter.   Continue other medications as prescribed Work toward weight loss Use Baclofen more often as her muscle spasm is uncontrolled.  Pt also encouraged to have regular massage as well as using biofreeze and heat.   Follow-up in 3 months or sooner PRN  Paticia Stack, PA-C 06/20/2018 9:30 AM

## 2018-06-20 NOTE — Patient Instructions (Signed)

## 2018-06-23 ENCOUNTER — Telehealth: Payer: Self-pay

## 2018-06-23 NOTE — Telephone Encounter (Signed)
PA has been completed for patient Emgality.

## 2018-08-05 ENCOUNTER — Telehealth: Payer: Self-pay | Admitting: Radiology

## 2018-08-05 NOTE — Telephone Encounter (Signed)
Left message to call [facility] to schedule December appt with Allie Dimmer

## 2018-08-07 DIAGNOSIS — G4733 Obstructive sleep apnea (adult) (pediatric): Secondary | ICD-10-CM | POA: Diagnosis not present

## 2018-08-13 DIAGNOSIS — F411 Generalized anxiety disorder: Secondary | ICD-10-CM | POA: Diagnosis not present

## 2018-08-13 DIAGNOSIS — F3181 Bipolar II disorder: Secondary | ICD-10-CM | POA: Diagnosis not present

## 2018-08-18 DIAGNOSIS — G4733 Obstructive sleep apnea (adult) (pediatric): Secondary | ICD-10-CM | POA: Diagnosis not present

## 2018-08-19 DIAGNOSIS — F411 Generalized anxiety disorder: Secondary | ICD-10-CM | POA: Diagnosis not present

## 2018-09-02 DIAGNOSIS — H524 Presbyopia: Secondary | ICD-10-CM | POA: Diagnosis not present

## 2018-09-02 DIAGNOSIS — E119 Type 2 diabetes mellitus without complications: Secondary | ICD-10-CM | POA: Diagnosis not present

## 2018-09-02 DIAGNOSIS — H5203 Hypermetropia, bilateral: Secondary | ICD-10-CM | POA: Diagnosis not present

## 2018-09-04 DIAGNOSIS — F3181 Bipolar II disorder: Secondary | ICD-10-CM | POA: Diagnosis not present

## 2018-09-04 DIAGNOSIS — F411 Generalized anxiety disorder: Secondary | ICD-10-CM | POA: Diagnosis not present

## 2018-09-22 ENCOUNTER — Encounter: Payer: Self-pay | Admitting: *Deleted

## 2018-09-22 DIAGNOSIS — F411 Generalized anxiety disorder: Secondary | ICD-10-CM | POA: Diagnosis not present

## 2018-10-03 ENCOUNTER — Other Ambulatory Visit
Admission: RE | Admit: 2018-10-03 | Discharge: 2018-10-03 | Disposition: A | Discharge status: Home / Self Care | Payer: 59 | Source: Ambulatory Visit | Attending: Internal Medicine | Admitting: Internal Medicine

## 2018-10-03 DIAGNOSIS — E1165 Type 2 diabetes mellitus with hyperglycemia: Secondary | ICD-10-CM | POA: Insufficient documentation

## 2018-10-03 DIAGNOSIS — E559 Vitamin D deficiency, unspecified: Secondary | ICD-10-CM | POA: Insufficient documentation

## 2018-10-03 LAB — COMPREHENSIVE METABOLIC PANEL
ALT: 9 U/L (ref 0–44)
AST: 11 U/L — AB (ref 15–41)
Albumin: 3.3 g/dL — ABNORMAL LOW (ref 3.5–5.0)
Alkaline Phosphatase: 101 U/L (ref 38–126)
Anion gap: 10 (ref 5–15)
BILIRUBIN TOTAL: 0.3 mg/dL (ref 0.3–1.2)
BUN: 12 mg/dL (ref 6–20)
CO2: 21 mmol/L — ABNORMAL LOW (ref 22–32)
CREATININE: 0.8 mg/dL (ref 0.44–1.00)
Calcium: 8.6 mg/dL — ABNORMAL LOW (ref 8.9–10.3)
Chloride: 101 mmol/L (ref 98–111)
GFR calc Af Amer: >60 mL/min (ref >60)
Glucose, Bld: 179 mg/dL — ABNORMAL HIGH (ref 70–99)
POTASSIUM: 3 mmol/L — AB (ref 3.5–5.1)
Sodium: 132 mmol/L — ABNORMAL LOW (ref 135–145)
TOTAL PROTEIN: 7.2 g/dL (ref 6.5–8.1)

## 2018-10-03 LAB — HEMOGLOBIN A1C
Hgb A1c MFr Bld: 7.9 % — ABNORMAL HIGH (ref 4.8–5.6)
Mean Plasma Glucose: 180.03 mg/dL

## 2018-10-04 LAB — VITAMIN D 25 HYDROXY (VIT D DEFICIENCY, FRACTURES): Vit D, 25-Hydroxy: 36.7 ng/mL (ref 30.0–100.0)

## 2018-10-06 DIAGNOSIS — E669 Obesity, unspecified: Secondary | ICD-10-CM | POA: Diagnosis not present

## 2018-10-06 DIAGNOSIS — E876 Hypokalemia: Secondary | ICD-10-CM | POA: Diagnosis not present

## 2018-10-06 DIAGNOSIS — E1165 Type 2 diabetes mellitus with hyperglycemia: Secondary | ICD-10-CM | POA: Diagnosis not present

## 2018-10-06 DIAGNOSIS — E1169 Type 2 diabetes mellitus with other specified complication: Secondary | ICD-10-CM | POA: Diagnosis not present

## 2018-10-16 ENCOUNTER — Other Ambulatory Visit: Payer: Self-pay | Admitting: Obstetrics

## 2018-10-16 DIAGNOSIS — Z1231 Encounter for screening mammogram for malignant neoplasm of breast: Secondary | ICD-10-CM

## 2018-11-17 DIAGNOSIS — Z01419 Encounter for gynecological examination (general) (routine) without abnormal findings: Secondary | ICD-10-CM | POA: Diagnosis not present

## 2018-11-17 DIAGNOSIS — G4733 Obstructive sleep apnea (adult) (pediatric): Secondary | ICD-10-CM | POA: Diagnosis not present

## 2018-11-21 ENCOUNTER — Ambulatory Visit: Payer: 59

## 2018-11-24 ENCOUNTER — Other Ambulatory Visit: Payer: Self-pay | Admitting: Obstetrics

## 2018-11-24 DIAGNOSIS — R2231 Localized swelling, mass and lump, right upper limb: Secondary | ICD-10-CM

## 2018-11-25 DIAGNOSIS — F330 Major depressive disorder, recurrent, mild: Secondary | ICD-10-CM | POA: Diagnosis not present

## 2018-11-25 DIAGNOSIS — F3181 Bipolar II disorder: Secondary | ICD-10-CM | POA: Diagnosis not present

## 2018-11-25 DIAGNOSIS — F411 Generalized anxiety disorder: Secondary | ICD-10-CM | POA: Diagnosis not present

## 2018-11-27 ENCOUNTER — Ambulatory Visit
Admission: RE | Admit: 2018-11-27 | Discharge: 2018-11-27 | Disposition: A | Discharge status: Home / Self Care | Payer: 59 | Source: Ambulatory Visit | Attending: Obstetrics | Admitting: Obstetrics

## 2018-11-27 DIAGNOSIS — R2231 Localized swelling, mass and lump, right upper limb: Secondary | ICD-10-CM

## 2018-11-27 DIAGNOSIS — Z803 Family history of malignant neoplasm of breast: Secondary | ICD-10-CM | POA: Diagnosis not present

## 2018-11-27 DIAGNOSIS — R922 Inconclusive mammogram: Secondary | ICD-10-CM | POA: Diagnosis not present

## 2018-11-27 DIAGNOSIS — N6489 Other specified disorders of breast: Secondary | ICD-10-CM | POA: Diagnosis not present

## 2018-11-27 IMAGING — MG DIGITAL DIAGNOSTIC BILATERAL MAMMOGRAM WITH TOMO AND CAD
6 of 10 series · 6 of 30 positions shown · non-contrast
Comparison: Previous exam(s).

CLINICAL DATA: Palpable lump in the right axilla. The patient's
mother and maternal grandmother had breast cancer. Three paternal
aunts had breast cancer.

EXAM:
DIGITAL DIAGNOSTIC BILATERAL MAMMOGRAM WITH CAD AND TOMO
ULTRASOUND RIGHT BREAST

[R TAN synth-2D]
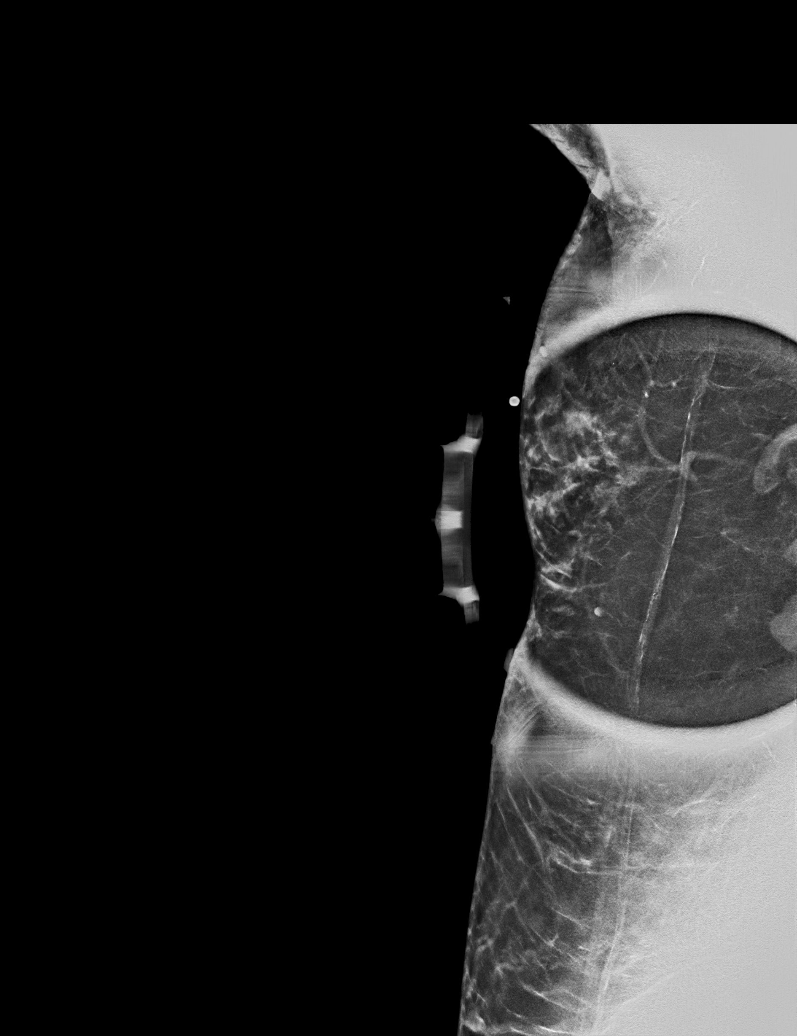

[R CC synth-2D]
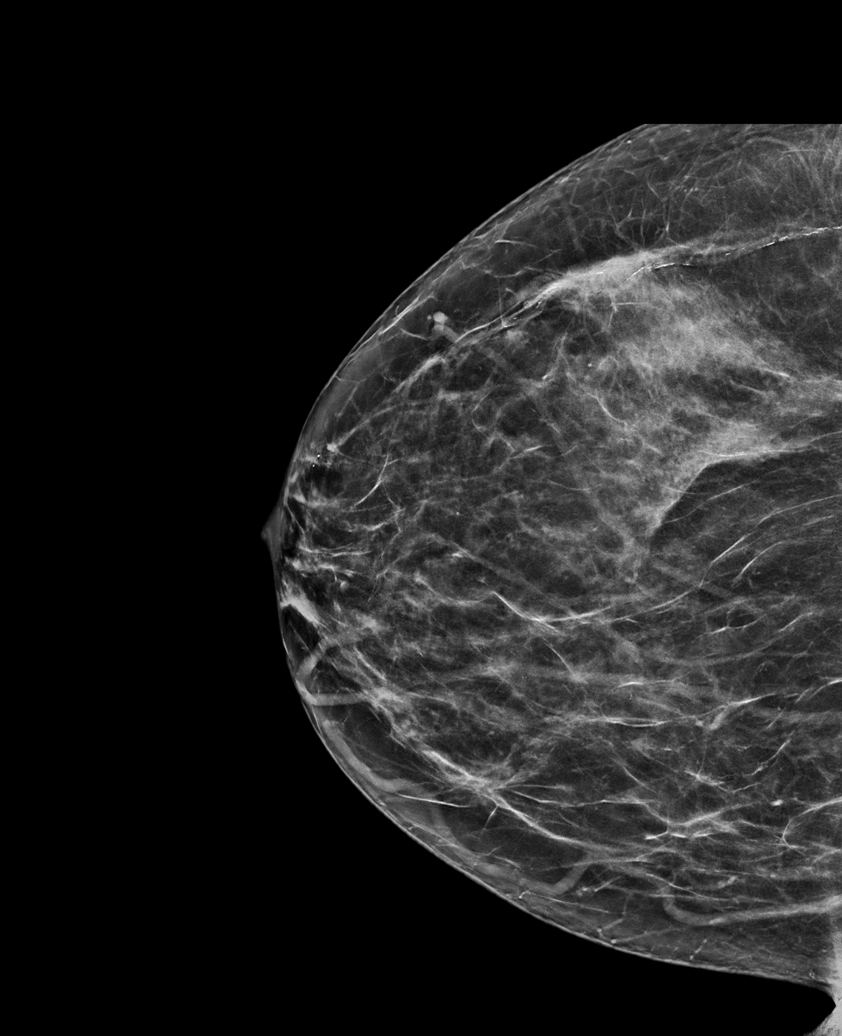

[L CC synth-2D]
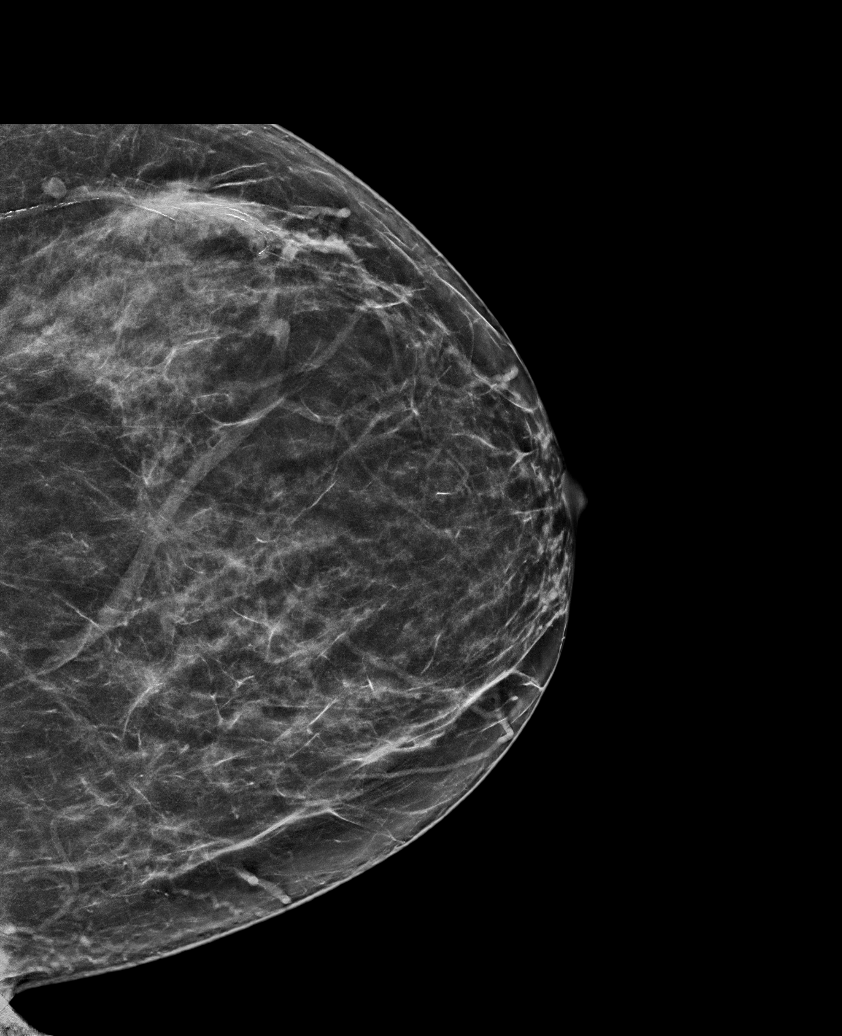

[L MLO synth-2D]
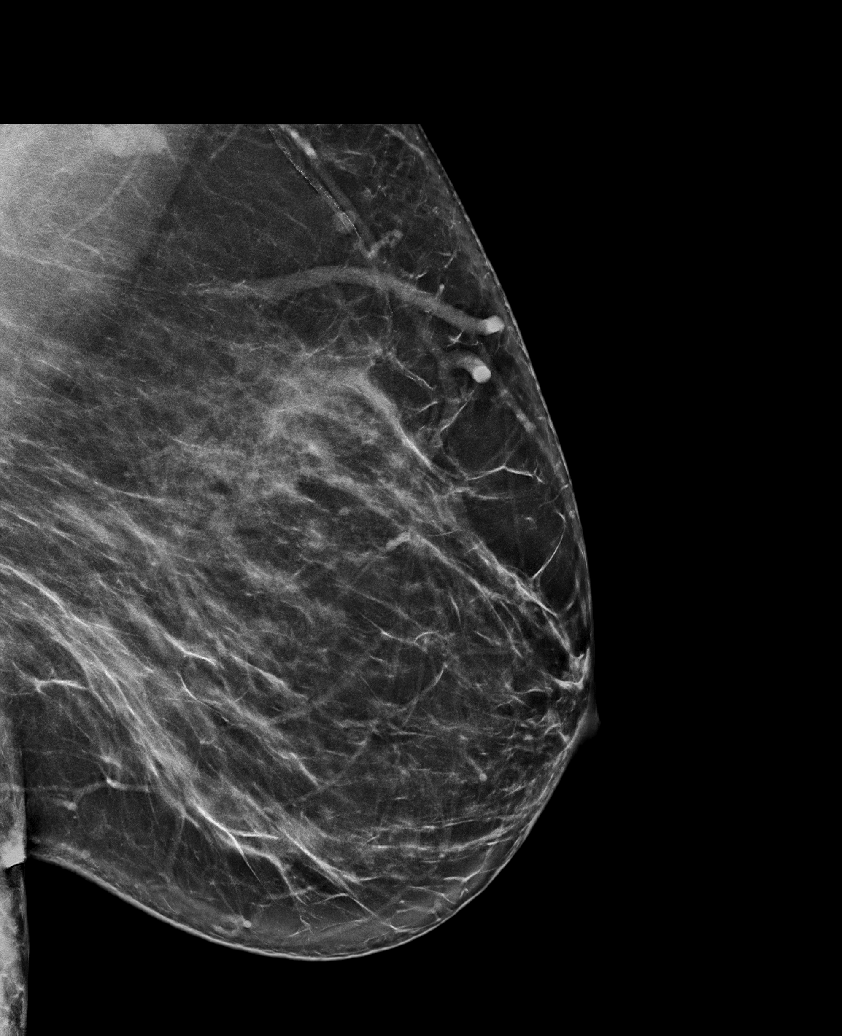

[R MLO synth-2D]
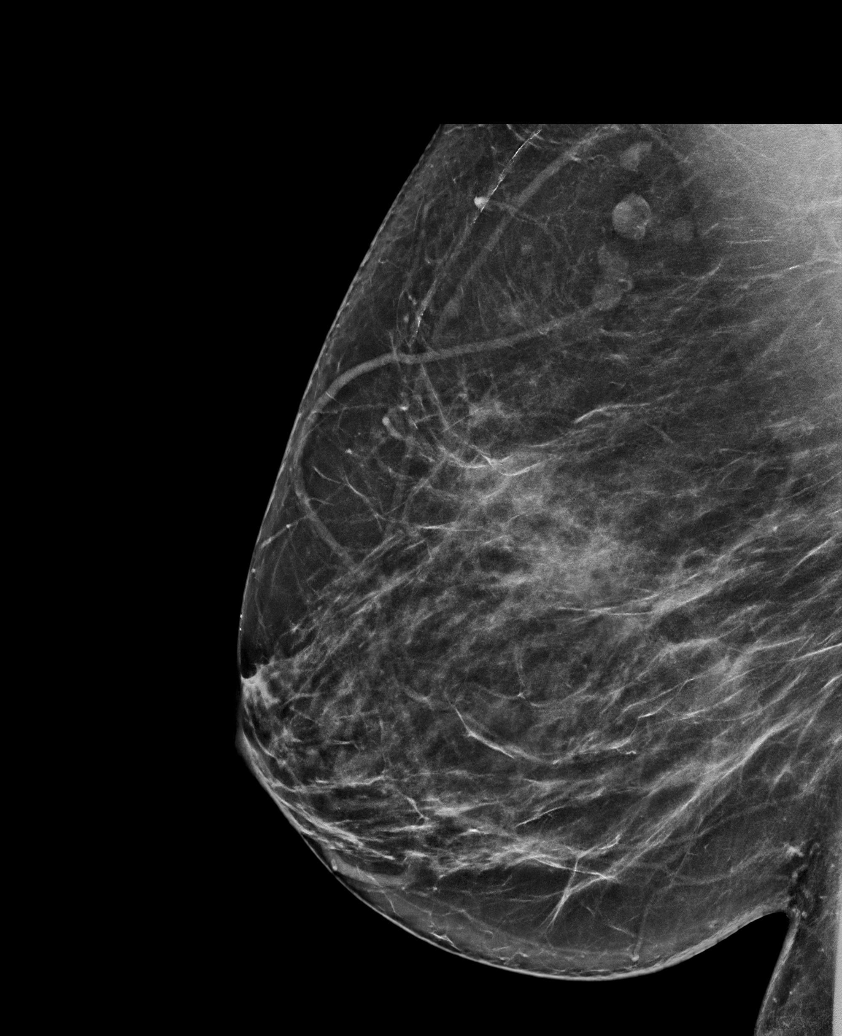

[R TAN tomo · tomo slice 35/70.0]
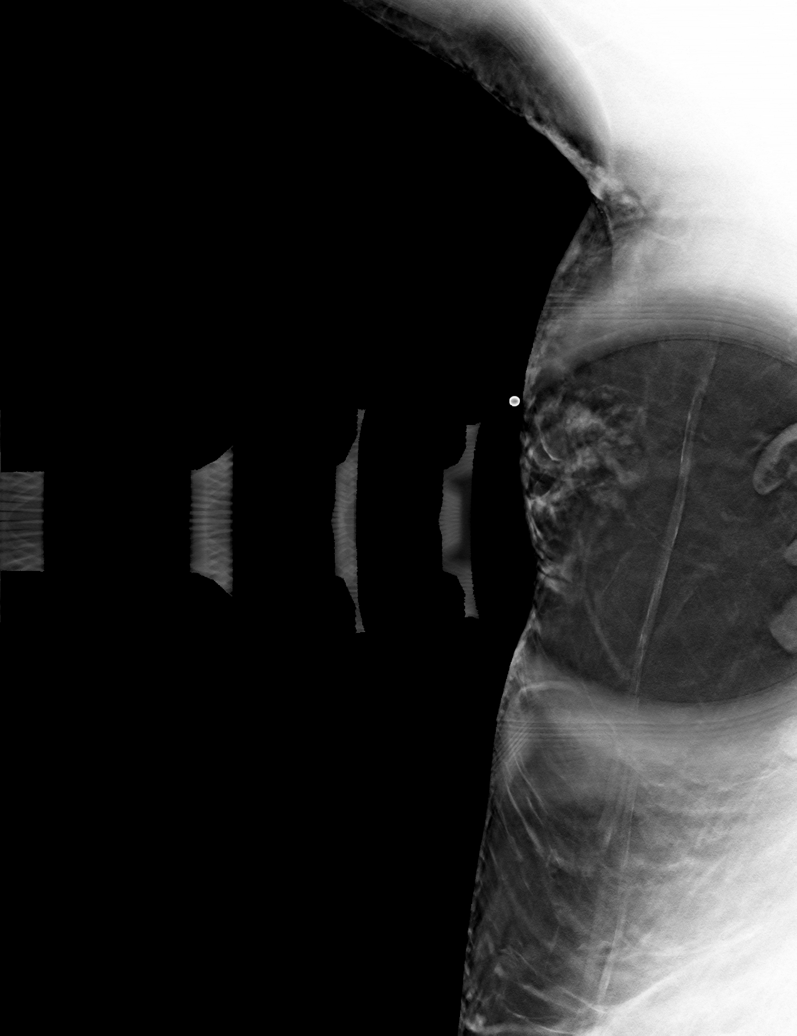

[6 of 30 positions shown; findings below may reference images not displayed]

ACR Breast Density Category c: The breast tissue is heterogeneously
dense, which may obscure small masses.
FINDINGS: The patient has an island of glandular tissue in the right axilla
which is unchanged since [DATE]. No other suspicious
mammographic findings are seen in either breast.

Mammographic images were processed with CAD.

On physical exam, no suspicious lumps are identified.

Targeted ultrasound is performed, showing an island of glandular
tissue in the right axilla correlating with the palpable lump.
IMPRESSION: No mammographic or sonographic evidence of malignancy. The patient
appears to be feeling a normal island of glandular tissue in the low
right axilla.

RECOMMENDATION:
Annual screening mammography. By report, the patient had genetic
testing for breast cancer and was negative for a breast cancer
genetic mutation. If not already performed, recommend assessment of
the patient's lifetime risk of breast cancer with a family history
oriented model such as Tyrer-Cuzick. If the patient's lifetime risk
of breast cancer is greater than 20%, recommend annual breast MRI in
addition to mammography. If the patient's lifetime risk of breast
cancer is less than 20%, annual mammography alone would be adequate.

I have discussed the findings and recommendations with the patient.
Results were also provided in writing at the conclusion of the
visit. If applicable, a reminder letter will be sent to the patient
regarding the next appointment.

BI-RADS CATEGORY  2: Benign.

## 2018-11-27 IMAGING — US US AXILLARY RIGHT
1 series · 8 of 8 positions shown · non-contrast
Comparison: Previous exam(s).

CLINICAL DATA: Palpable lump in the right axilla. The patient's
mother and maternal grandmother had breast cancer. Three paternal
aunts had breast cancer.

EXAM:
DIGITAL DIAGNOSTIC BILATERAL MAMMOGRAM WITH CAD AND TOMO
ULTRASOUND RIGHT BREAST

[Series 1: us axillary right · 0.06mm/px · 8 of 8 slices shown]
[im 1/8]
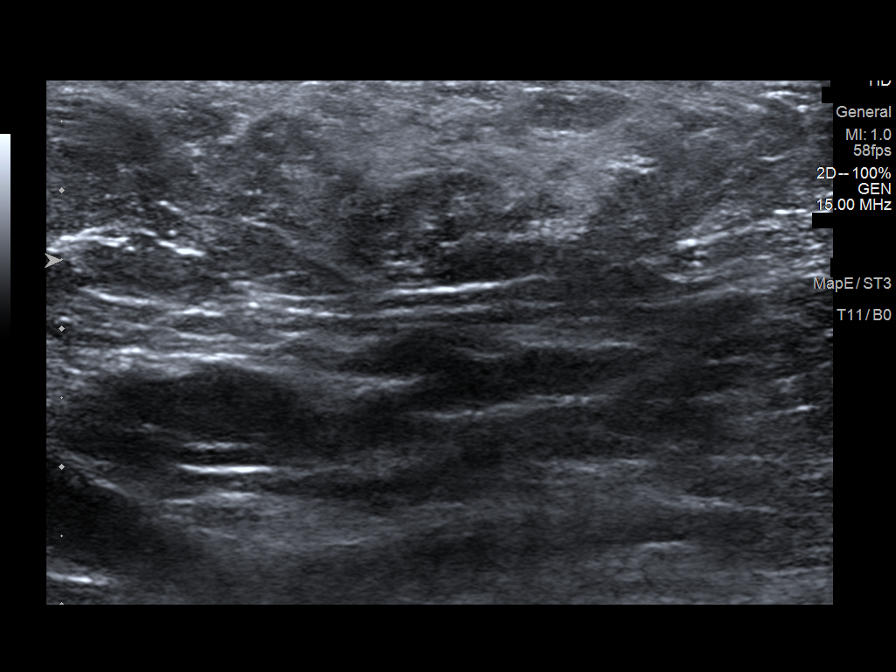
[im 2/8]
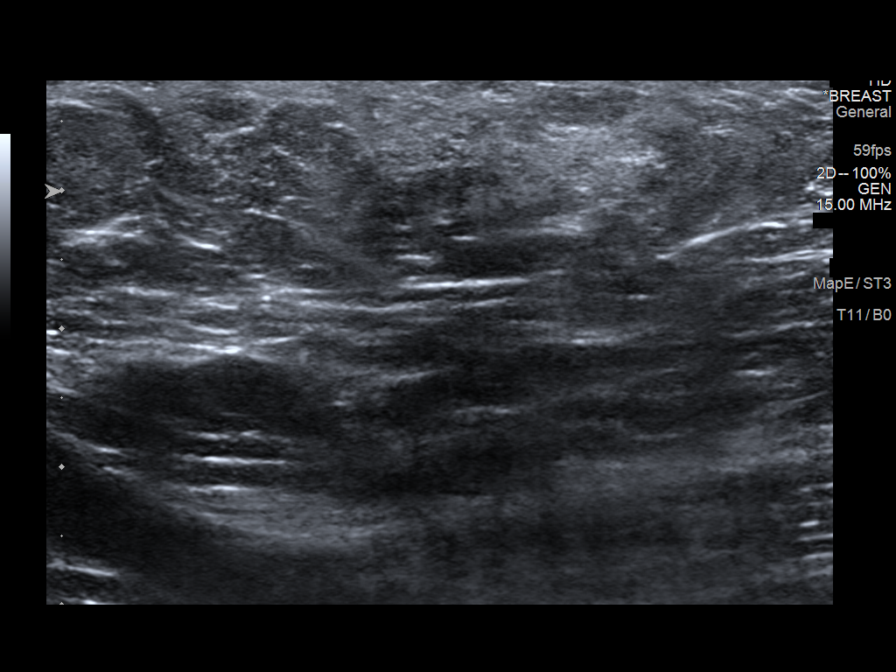
[im 3/8]
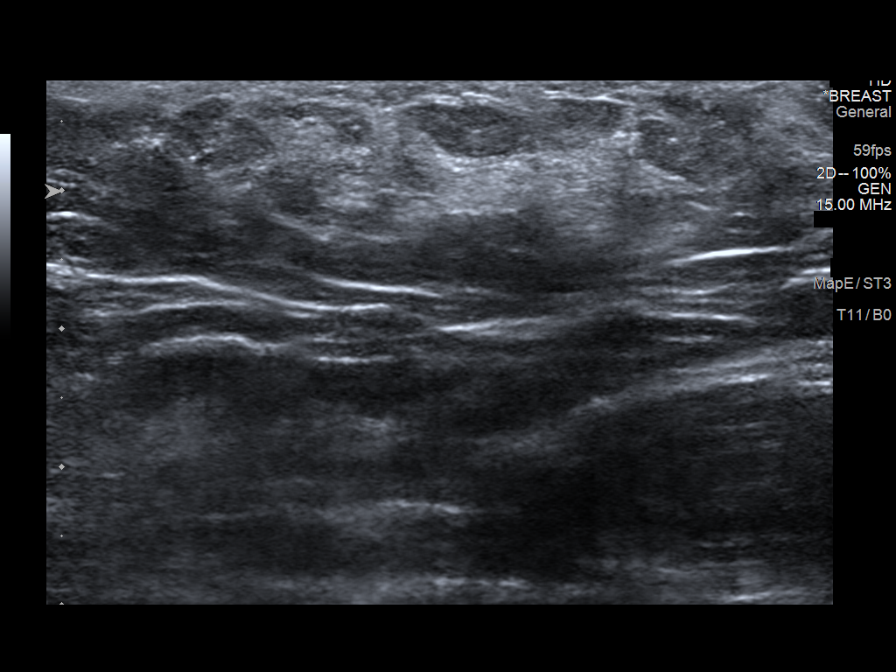
[im 4/8]
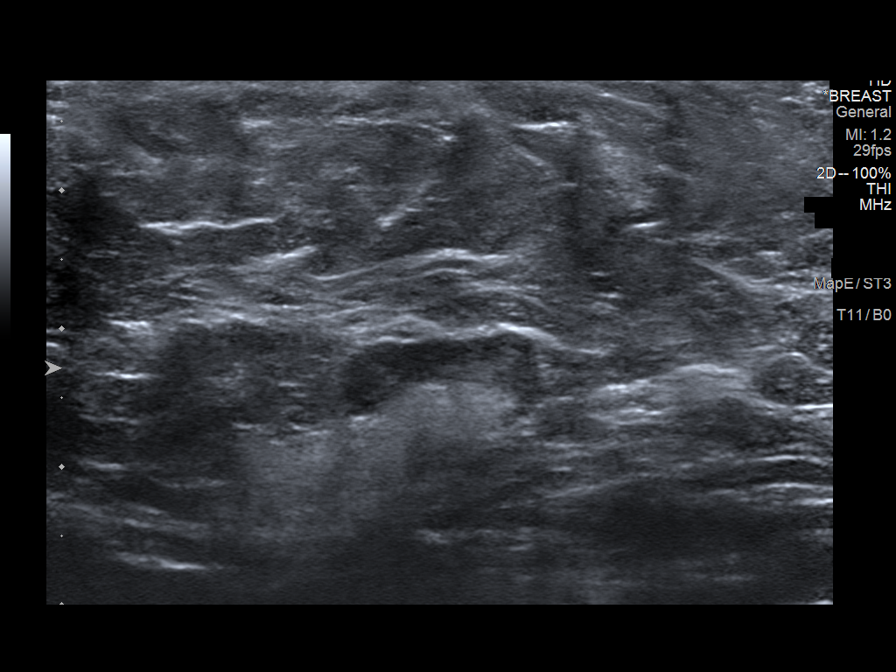
[im 5/8]
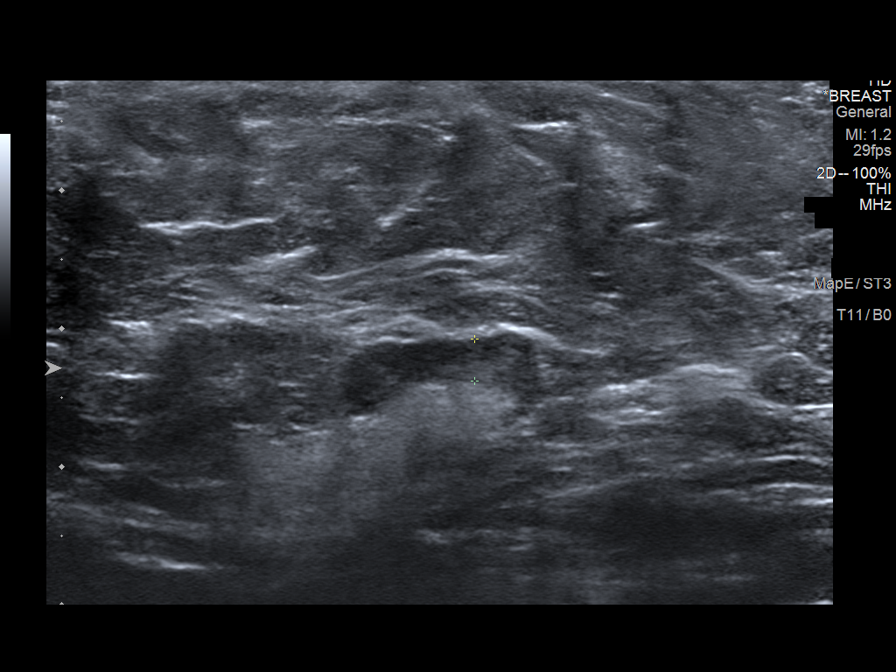
[im 6/8]
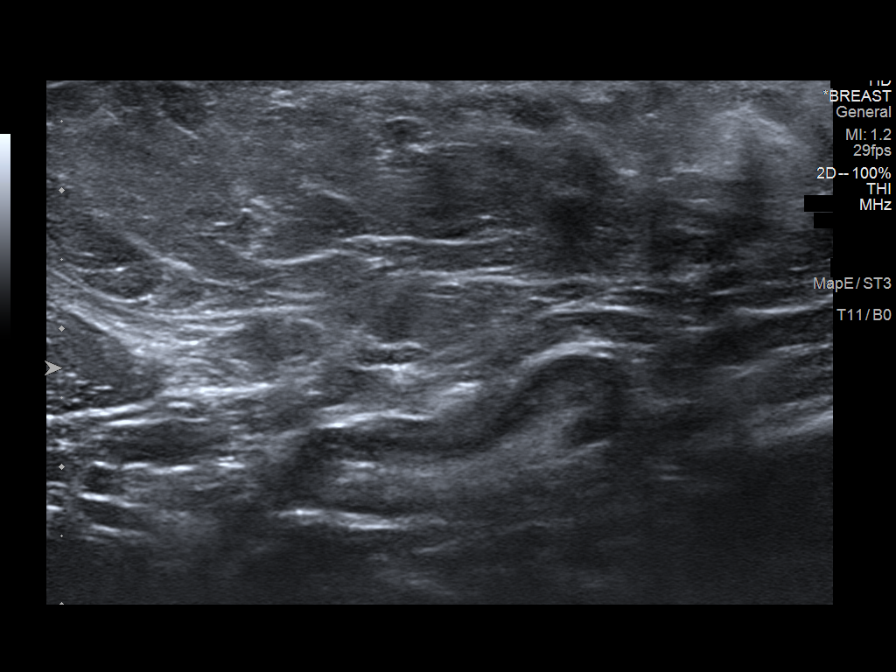
[im 7/8]
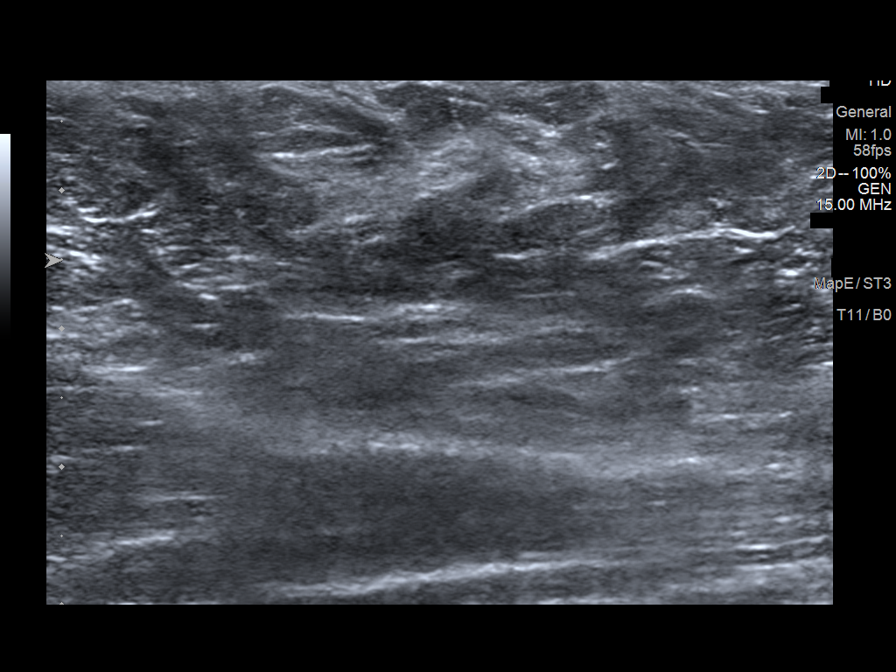
[im 8/8]
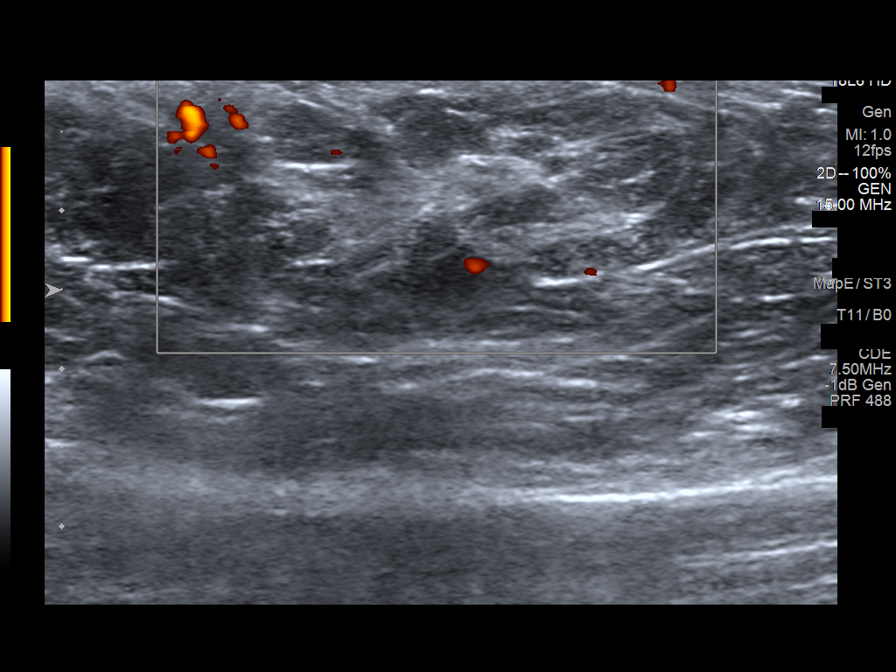

[8 of 8 positions shown; findings below may reference images not displayed]

ACR Breast Density Category c: The breast tissue is heterogeneously
dense, which may obscure small masses.
FINDINGS: The patient has an island of glandular tissue in the right axilla
which is unchanged since [DATE]. No other suspicious
mammographic findings are seen in either breast.

Mammographic images were processed with CAD.

On physical exam, no suspicious lumps are identified.

Targeted ultrasound is performed, showing an island of glandular
tissue in the right axilla correlating with the palpable lump.
IMPRESSION: No mammographic or sonographic evidence of malignancy. The patient
appears to be feeling a normal island of glandular tissue in the low
right axilla.

RECOMMENDATION:
Annual screening mammography. By report, the patient had genetic
testing for breast cancer and was negative for a breast cancer
genetic mutation. If not already performed, recommend assessment of
the patient's lifetime risk of breast cancer with a family history
oriented model such as Tyrer-Cuzick. If the patient's lifetime risk
of breast cancer is greater than 20%, recommend annual breast MRI in
addition to mammography. If the patient's lifetime risk of breast
cancer is less than 20%, annual mammography alone would be adequate.

I have discussed the findings and recommendations with the patient.
Results were also provided in writing at the conclusion of the
visit. If applicable, a reminder letter will be sent to the patient
regarding the next appointment.

BI-RADS CATEGORY  2: Benign.

## 2018-12-04 DIAGNOSIS — Z1211 Encounter for screening for malignant neoplasm of colon: Secondary | ICD-10-CM | POA: Diagnosis not present

## 2018-12-04 DIAGNOSIS — R1032 Left lower quadrant pain: Secondary | ICD-10-CM | POA: Diagnosis not present

## 2018-12-04 DIAGNOSIS — K219 Gastro-esophageal reflux disease without esophagitis: Secondary | ICD-10-CM | POA: Diagnosis not present

## 2018-12-17 DIAGNOSIS — G4733 Obstructive sleep apnea (adult) (pediatric): Secondary | ICD-10-CM | POA: Diagnosis not present

## 2018-12-22 DIAGNOSIS — Z319 Encounter for procreative management, unspecified: Secondary | ICD-10-CM | POA: Diagnosis not present

## 2018-12-22 DIAGNOSIS — E288 Other ovarian dysfunction: Secondary | ICD-10-CM | POA: Diagnosis not present

## 2018-12-23 DIAGNOSIS — E288 Other ovarian dysfunction: Secondary | ICD-10-CM | POA: Diagnosis not present

## 2018-12-23 DIAGNOSIS — N979 Female infertility, unspecified: Secondary | ICD-10-CM | POA: Diagnosis not present

## 2019-01-06 DIAGNOSIS — E288 Other ovarian dysfunction: Secondary | ICD-10-CM | POA: Diagnosis not present

## 2019-01-08 DIAGNOSIS — E669 Obesity, unspecified: Secondary | ICD-10-CM | POA: Diagnosis not present

## 2019-01-08 DIAGNOSIS — E1169 Type 2 diabetes mellitus with other specified complication: Secondary | ICD-10-CM | POA: Diagnosis not present

## 2019-01-08 DIAGNOSIS — E559 Vitamin D deficiency, unspecified: Secondary | ICD-10-CM | POA: Diagnosis not present

## 2019-01-08 DIAGNOSIS — E1165 Type 2 diabetes mellitus with hyperglycemia: Secondary | ICD-10-CM | POA: Diagnosis not present

## 2019-01-13 ENCOUNTER — Other Ambulatory Visit
Admission: RE | Admit: 2019-01-13 | Discharge: 2019-01-13 | Disposition: A | Discharge status: Home / Self Care | Payer: 59 | Source: Ambulatory Visit | Attending: Internal Medicine | Admitting: Internal Medicine

## 2019-01-13 DIAGNOSIS — E559 Vitamin D deficiency, unspecified: Secondary | ICD-10-CM | POA: Diagnosis not present

## 2019-01-13 DIAGNOSIS — E119 Type 2 diabetes mellitus without complications: Secondary | ICD-10-CM | POA: Insufficient documentation

## 2019-01-13 LAB — COMPREHENSIVE METABOLIC PANEL
ALT: 10 U/L (ref 0–44)
AST: 12 U/L — ABNORMAL LOW (ref 15–41)
Albumin: 3.2 g/dL — ABNORMAL LOW (ref 3.5–5.0)
Alkaline Phosphatase: 97 U/L (ref 38–126)
Anion gap: 7 (ref 5–15)
BUN: 10 mg/dL (ref 6–20)
CO2: 23 mmol/L (ref 22–32)
Calcium: 8.6 mg/dL — ABNORMAL LOW (ref 8.9–10.3)
Chloride: 108 mmol/L (ref 98–111)
Creatinine, Ser: 0.63 mg/dL (ref 0.44–1.00)
GFR calc Af Amer: >60 mL/min (ref >60)
GFR calc non Af Amer: >60 mL/min (ref >60)
Glucose, Bld: 113 mg/dL — ABNORMAL HIGH (ref 70–99)
Potassium: 3.2 mmol/L — ABNORMAL LOW (ref 3.5–5.1)
Sodium: 138 mmol/L (ref 135–145)
Total Bilirubin: 0.5 mg/dL (ref 0.3–1.2)
Total Protein: 6.8 g/dL (ref 6.5–8.1)

## 2019-01-13 LAB — CREATININE, URINE, RANDOM: Creatinine, Urine: 287 mg/dL

## 2019-01-14 LAB — HEMOGLOBIN A1C
Hgb A1c MFr Bld: 8.7 % — ABNORMAL HIGH (ref 4.8–5.6)
Mean Plasma Glucose: 203 mg/dL

## 2019-01-14 LAB — VITAMIN D 25 HYDROXY (VIT D DEFICIENCY, FRACTURES): Vit D, 25-Hydroxy: 47.3 ng/mL (ref 30.0–100.0)

## 2019-01-14 LAB — MICROALBUMIN, URINE: Microalb, Ur: 9.9 ug/mL — ABNORMAL HIGH

## 2019-01-15 DIAGNOSIS — F411 Generalized anxiety disorder: Secondary | ICD-10-CM | POA: Diagnosis not present

## 2019-01-15 DIAGNOSIS — F3181 Bipolar II disorder: Secondary | ICD-10-CM | POA: Diagnosis not present

## 2019-01-30 ENCOUNTER — Encounter: Payer: Self-pay | Admitting: Physician Assistant

## 2019-01-30 ENCOUNTER — Other Ambulatory Visit: Payer: Self-pay

## 2019-01-30 ENCOUNTER — Ambulatory Visit (INDEPENDENT_AMBULATORY_CARE_PROVIDER_SITE_OTHER): Payer: 59 | Admitting: Physician Assistant

## 2019-01-30 DIAGNOSIS — G43009 Migraine without aura, not intractable, without status migrainosus: Secondary | ICD-10-CM | POA: Diagnosis not present

## 2019-01-30 DIAGNOSIS — M62838 Other muscle spasm: Secondary | ICD-10-CM | POA: Diagnosis not present

## 2019-01-30 MED ORDER — BACLOFEN 10 MG PO TABS: 10.0000 mg | ORAL_TABLET | Freq: Every day | ORAL | 2 refills | Status: DC | PRN

## 2019-01-30 MED ORDER — RIZATRIPTAN BENZOATE 10 MG PO TBDP: 10.0000 mg | ORAL_TABLET | ORAL | 3 refills | Status: DC | PRN

## 2019-01-30 MED ORDER — PROMETHAZINE HCL 25 MG PO TABS: 25.0000 mg | ORAL_TABLET | Freq: Four times a day (QID) | ORAL | 2 refills | Status: DC | PRN

## 2019-01-30 NOTE — Progress Notes (Signed)
I connected with  Julie Hansen on 01/30/19 at  8:00 AM EDT by telephone and verified that I am speaking with the correct person using two identifiers.   I discussed the limitations, risks, security and privacy concerns of performing an evaluation and management service by telephone and the availability of in person appointments. I also discussed with the patient that there may be a patient responsible charge related to this service. The patient expressed understanding and agreed to proceed.  Frederika Hukill Jeanella Anton, [place] 01/30/2019  8:18 AM

## 2019-01-30 NOTE — Progress Notes (Signed)
TELEHEALTH VIRTUAL HEADACHE VISIT ENCOUNTER NOTE  I connected with Julie Hansen on 01/30/19 at  8:00 AM EDT by telephone at home and verified that I am speaking with the correct person using two identifiers.   I discussed the limitations, risks, security and privacy concerns of performing an evaluation and management service by telephone and the availability of in person appointments. I also discussed with the patient that there may be a patient responsible charge related to this service. The patient expressed understanding and agreed to proceed.   History:  Julie Hansen is a 48 y.o. G6P2002 female being evaluated today for headache.   She states she has had 4 or 5 headaches this month.  She feels like the Emgality is somewhat helpful. She is on her 2nd or 3rd dose.  She is not remembering to take it on a regular basis. She feels the headache is more severe than previous.   She is using Maxalt which is helpful but does sometimes require phenergan for HA rescue.  It does not cause sleepiness.  She is able to use it and still work.  She is also using her baclofen on a somewhat regular basis as her muscles are often in spasm.  This medication she reports is also helpful while she is at work without causing sedation.   She was having GI problems and will be having a colonoscopy and EGD.  She has had changes to her diabetes medication regimen.       Past Medical History:  Diagnosis Date  . Acid reflux   . Asthma   . Diabetes mellitus without complication ([place])   . Hyperlipidemia   . PCOS (polycystic ovarian syndrome)   . PONV (postoperative nausea and vomiting)    Past Surgical History:  Procedure Laterality Date  . DILATION AND EVACUATION N/A 06/05/2017   Procedure: DILATATION AND EVACUATION;  Surgeon: Sanjuana Kava, MD;  Location: [facility];  Service: Gynecology;  Laterality: N/A;  . WISDOM TOOTH EXTRACTION     The following portions of the patient's history were reviewed  and updated as appropriate: allergies, current medications, past family history, past medical history, past social history, past surgical history and problem list.    Review of Systems:  Pertinent items noted in HPI and remainder of comprehensive ROS otherwise negative.  Physical Exam:   General:  Alert, oriented and cooperative.   Mental Status: Normal mood and affect perceived. Normal judgment and thought content.  Physical exam deferred due to nature of the encounter  Labs and Imaging No results found for this or any previous visit (from the past 336 hour(s)). No results found.    Assessment and Plan:      1. Muscle spasm   2. Migraine without aura and without status migrainosus, not intractable   No improvement  Pt to use Emgality REGULARLY.  Since Sept, she has used 2-3 times.  This is not sufficient to get good results.  Encouraged to be diligent with its use and she is likely to see improvement.   Taper down and off Topamax as no benefit is noted.   Continue Baclofen, Maxalt and Phenergan for acute use as needed.  Pt states no sedation issues.     I discussed the assessment and treatment plan with the patient. The patient was provided an opportunity to ask questions and all were answered. The patient agreed with the plan and demonstrated an understanding of the instructions.   The patient was advised to call back or  seek an in-person evaluation/go to the ED if the symptoms worsen or if the condition fails to improve as anticipated.  I provided 25 minutes of video face-to-face time during this encounter.   Paticia Stack, PA-C [facility], [place]

## 2019-01-30 NOTE — Patient Instructions (Signed)

## 2019-02-05 DIAGNOSIS — E1169 Type 2 diabetes mellitus with other specified complication: Secondary | ICD-10-CM | POA: Diagnosis not present

## 2019-02-05 DIAGNOSIS — E669 Obesity, unspecified: Secondary | ICD-10-CM | POA: Diagnosis not present

## 2019-02-05 DIAGNOSIS — E1165 Type 2 diabetes mellitus with hyperglycemia: Secondary | ICD-10-CM | POA: Diagnosis not present

## 2019-02-06 DIAGNOSIS — D124 Benign neoplasm of descending colon: Secondary | ICD-10-CM | POA: Diagnosis not present

## 2019-02-06 DIAGNOSIS — K5140 Inflammatory polyps of colon without complications: Secondary | ICD-10-CM | POA: Diagnosis not present

## 2019-02-06 DIAGNOSIS — R110 Nausea: Secondary | ICD-10-CM | POA: Diagnosis not present

## 2019-02-06 DIAGNOSIS — K635 Polyp of colon: Secondary | ICD-10-CM | POA: Diagnosis not present

## 2019-02-06 DIAGNOSIS — D125 Benign neoplasm of sigmoid colon: Secondary | ICD-10-CM | POA: Diagnosis not present

## 2019-02-06 DIAGNOSIS — Z1211 Encounter for screening for malignant neoplasm of colon: Secondary | ICD-10-CM | POA: Diagnosis not present

## 2019-02-06 DIAGNOSIS — K219 Gastro-esophageal reflux disease without esophagitis: Secondary | ICD-10-CM | POA: Diagnosis not present

## 2019-02-17 DIAGNOSIS — G4733 Obstructive sleep apnea (adult) (pediatric): Secondary | ICD-10-CM | POA: Diagnosis not present

## 2019-03-18 DIAGNOSIS — F411 Generalized anxiety disorder: Secondary | ICD-10-CM | POA: Diagnosis not present

## 2019-03-18 DIAGNOSIS — F3181 Bipolar II disorder: Secondary | ICD-10-CM | POA: Diagnosis not present

## 2019-04-01 DIAGNOSIS — G4733 Obstructive sleep apnea (adult) (pediatric): Secondary | ICD-10-CM | POA: Diagnosis not present

## 2019-05-06 DIAGNOSIS — R0600 Dyspnea, unspecified: Secondary | ICD-10-CM | POA: Diagnosis not present

## 2019-05-06 DIAGNOSIS — E559 Vitamin D deficiency, unspecified: Secondary | ICD-10-CM | POA: Diagnosis not present

## 2019-05-06 DIAGNOSIS — E1165 Type 2 diabetes mellitus with hyperglycemia: Secondary | ICD-10-CM | POA: Diagnosis not present

## 2019-05-06 DIAGNOSIS — E669 Obesity, unspecified: Secondary | ICD-10-CM | POA: Diagnosis not present

## 2019-05-06 DIAGNOSIS — E1169 Type 2 diabetes mellitus with other specified complication: Secondary | ICD-10-CM | POA: Diagnosis not present

## 2019-05-07 DIAGNOSIS — E119 Type 2 diabetes mellitus without complications: Secondary | ICD-10-CM | POA: Diagnosis not present

## 2019-05-07 DIAGNOSIS — Z794 Long term (current) use of insulin: Secondary | ICD-10-CM | POA: Diagnosis not present

## 2019-05-07 DIAGNOSIS — R0602 Shortness of breath: Secondary | ICD-10-CM | POA: Diagnosis not present

## 2019-05-07 DIAGNOSIS — E782 Mixed hyperlipidemia: Secondary | ICD-10-CM | POA: Diagnosis not present

## 2019-05-15 ENCOUNTER — Encounter: Payer: 59 | Admitting: Physician Assistant

## 2019-05-15 DIAGNOSIS — R0600 Dyspnea, unspecified: Secondary | ICD-10-CM | POA: Diagnosis not present

## 2019-05-19 DIAGNOSIS — G4733 Obstructive sleep apnea (adult) (pediatric): Secondary | ICD-10-CM | POA: Diagnosis not present

## 2019-05-26 DIAGNOSIS — F3181 Bipolar II disorder: Secondary | ICD-10-CM | POA: Diagnosis not present

## 2019-05-26 DIAGNOSIS — F411 Generalized anxiety disorder: Secondary | ICD-10-CM | POA: Diagnosis not present

## 2019-06-15 ENCOUNTER — Other Ambulatory Visit: Payer: Self-pay | Admitting: Physician Assistant

## 2019-06-15 DIAGNOSIS — G43009 Migraine without aura, not intractable, without status migrainosus: Secondary | ICD-10-CM

## 2019-06-25 ENCOUNTER — Other Ambulatory Visit: Payer: Self-pay | Admitting: Physician Assistant

## 2019-06-26 ENCOUNTER — Other Ambulatory Visit: Payer: Self-pay

## 2019-06-26 ENCOUNTER — Ambulatory Visit (INDEPENDENT_AMBULATORY_CARE_PROVIDER_SITE_OTHER): Payer: 59 | Admitting: Physician Assistant

## 2019-06-26 ENCOUNTER — Encounter: Payer: Self-pay | Admitting: Physician Assistant

## 2019-06-26 VITALS — BP 95/63 | HR 84

## 2019-06-26 DIAGNOSIS — G43009 Migraine without aura, not intractable, without status migrainosus: Secondary | ICD-10-CM

## 2019-06-26 DIAGNOSIS — M62838 Other muscle spasm: Secondary | ICD-10-CM

## 2019-06-26 MED ORDER — EMGALITY 120 MG/ML ~~LOC~~ SOAJ: SUBCUTANEOUS | 11 refills | Status: DC

## 2019-06-26 MED ORDER — PROMETHAZINE HCL 25 MG PO TABS: 25.0000 mg | ORAL_TABLET | Freq: Four times a day (QID) | ORAL | 2 refills | Status: DC | PRN

## 2019-06-26 MED ORDER — BACLOFEN 10 MG PO TABS: 10.0000 mg | ORAL_TABLET | Freq: Every day | ORAL | 2 refills | Status: DC | PRN

## 2019-06-26 MED ORDER — RIZATRIPTAN BENZOATE 10 MG PO TBDP: 10.0000 mg | ORAL_TABLET | ORAL | 11 refills | Status: DC | PRN

## 2019-06-26 NOTE — Progress Notes (Signed)
Refill today Baclofen Maxalt   History:  Julie Hansen is a 48 y.o. [id number] who presents to clinic today for headache followup.  She did not  Use 2 emgality when she restarted.  She is not certain if she has used that med every 30 days.  She has none.  She is using maxalt more quickly when she gets the pain.  She runs out of maxalt every month.  Her headaches now are more severe and with more nausea.    HIT6:56 Number of days in the last 4 weeks with:  Severe headache: 2 Moderate headache: 10 Mild headache: 1  No headache: 0   Past Medical History:  Diagnosis Date  . Acid reflux   . Asthma   . Diabetes mellitus without complication ([place])   . Hyperlipidemia   . PCOS (polycystic ovarian syndrome)   . PONV (postoperative nausea and vomiting)     Social History   Socioeconomic History  . Marital status: Married    Spouse name: Not on file  . Number of children: Not on file  . Years of education: Not on file  . Highest education level: Not on file  Occupational History  . Not on file  Social Needs  . Financial resource strain: Not on file  . Food insecurity    Worry: Not on file    Inability: Not on file  . Transportation needs    Medical: Not on file    Non-medical: Not on file  Tobacco Use  . Smoking status: Never Smoker  . Smokeless tobacco: Never Used  Substance and Sexual Activity  . Alcohol use: Yes    Alcohol/week: 6.0 standard drinks    Types: 6 Standard drinks or equivalent per week    Comment: 1-2 every 2-3 weeks  . Drug use: No  . Sexual activity: Yes    Birth control/protection: None  Lifestyle  . Physical activity    Days per week: Not on file    Minutes per session: Not on file  . Stress: Not on file  Relationships  . Social Herbalist on phone: Not on file    Gets together: Not on file    Attends religious service: Not on file    Active member of club or organization: Not on file    Attends meetings of clubs or  organizations: Not on file    Relationship status: Not on file  . Intimate partner violence    Fear of current or ex partner: Not on file    Emotionally abused: Not on file    Physically abused: Not on file    Forced sexual activity: Not on file  Other Topics Concern  . Not on file  Social History Narrative  . Not on file    Family History  Problem Relation Age of Onset  . Cancer Mother   . Hypertension Mother   . Diabetes Mother   . Breast cancer Mother 37  . Cancer Maternal Grandmother   . Breast cancer Maternal Grandmother 39  . Cancer Father   . Diabetes Father   . Hypertension Father   . Breast cancer Paternal Aunt 27  . Breast cancer Paternal Aunt 62  . Breast cancer Paternal Aunt 79    Allergies  Allergen Reactions  . Hydrocodone-Homatropine Itching  . Macrodantin [Nitrofurantoin Macrocrystal] Itching  . Carbamazepine Other (See Comments)    Vaginal discharge    Current Outpatient Medications on File Prior to Visit  Medication Sig  Dispense Refill  . albuterol (PROVENTIL) (2.5 MG/3ML) 0.083% nebulizer solution Take 2.5 mg by nebulization every 6 (six) hours as needed.    . ALPRAZolam (XANAX) 1 MG tablet Take 0.5 mg by mouth 2 (two) times daily as needed for anxiety.     Marland Kitchen atorvastatin (LIPITOR) 10 MG tablet Take 10 mg by mouth daily.    . baclofen (LIORESAL) 10 MG tablet Take 1 tablet (10 mg total) by mouth daily as needed. 30 tablet 2  . beclomethasone (QVAR) 40 MCG/ACT inhaler Inhale 2 puffs into the lungs 2 (two) times daily. prn    . Galcanezumab-gnlm (EMGALITY) 120 MG/ML SOAJ Inject 240 mg into the skin as directed AND 120 mg every 30 (thirty) days. Inj 240mg  once then 120mg  monthly. 1 pen 11  . ibuprofen (ADVIL,MOTRIN) 800 MG tablet Take 1 tablet (800 mg total) by mouth every 8 (eight) hours as needed. 60 tablet 3  . lansoprazole (PREVACID) 30 MG capsule Take 30 mg by mouth 2 (two) times daily before a meal.     . lisinopril (PRINIVIL,ZESTRIL) 5 MG tablet  Take 5 mg by mouth daily.    . promethazine (PHENERGAN) 25 MG tablet Take 1 tablet (25 mg total) by mouth every 6 (six) hours as needed for nausea or vomiting. 30 tablet 2  . rizatriptan (MAXALT-MLT) 10 MG disintegrating tablet Take 1 tablet (10 mg total) by mouth as needed for migraine. May repeat in 2 hours if needed 10 tablet 3  . zolpidem (AMBIEN) 10 MG tablet Take 10 mg by mouth at bedtime as needed. Reported on 10/17/2015    . glimepiride (AMARYL) 4 MG tablet Take 2 mg by mouth 2 (two) times daily.     Marland Kitchen HYDROcodone-acetaminophen (NORCO/VICODIN) 5-325 MG tablet Take 1-2 tablets by mouth every 6 (six) hours as needed for moderate pain. (Patient not taking: Reported on 07/15/2017) 30 tablet 0  . metFORMIN (GLUCOPHAGE) 1000 MG tablet Take 500 mg by mouth 2 (two) times daily with a meal. Reported on 12/27/2015    . ranitidine (ZANTAC) 300 MG tablet Take 300 mg by mouth at bedtime.    . Vitamin D, Ergocalciferol, (DRISDOL) 1.25 MG (50000 UT) CAPS capsule      No current facility-administered medications on file prior to visit.      Review of Systems:  All pertinent positive/negative included in HPI, all other review of systems are negative   Objective:  Physical Exam BP 95/63   Pulse 84  CONSTITUTIONAL: Well-developed, well-nourished female in no acute distress.  EYES: EOM intact ENT: Normocephalic CARDIOVASCULAR: Regular rate  RESPIRATORY: Normal rate.   MUSCULOSKELETAL: Normal ROM SKIN: Warm, dry without erythema  NEUROLOGICAL: Alert, oriented, CN II-XII grossly intact, Appropriate balance PSYCH: Normal behavior, mood   Assessment & Plan:  Assessment: 1. Muscle spasm   2. Migraine without aura and without status migrainosus, not intractable   Unimproved (Pt has not been fully compliant)   Plan: Refill Emgality - sample provided that was prepared for patient at LAST visit.  This is to enable her to use 2 injections at once to "restart" emgality as she has not used it every 30  days.  She is encouraged to maintain this regularly.  Refills for Maxalt, Baclofen, Phenergan.  She is praised for using maxalt early in the migraine and encouraged to continue this practice.  Pt advised to exercise.  She has committed she will walk at least 40 minutes over the next 3 days (baby steps).  Will consider neurontin  for prevention if Emgality is insufficient.  Follow-up in 3 months or sooner PRN  Paticia Stack, PA-C 06/26/2019 8:13 AM

## 2019-06-26 NOTE — Patient Instructions (Signed)

## 2019-07-02 ENCOUNTER — Encounter: Payer: Self-pay | Admitting: *Deleted

## 2019-07-31 ENCOUNTER — Other Ambulatory Visit: Payer: Self-pay

## 2019-07-31 MED ORDER — NURTEC 75 MG PO TBDP: 10.0000 mg | ORAL_TABLET | Freq: Every day | ORAL | 1 refills | Status: DC | PRN

## 2019-07-31 NOTE — Telephone Encounter (Signed)
Received message from patient she reports having a headache and is all out of refill for maxalt. Per Julie Hansen. She can try nutrec 10mg   prn for migrane. If this does not work she should call our office on Monday to follow up. Patient also advised to download a savings card to help with cost of medications. Patient voice understanding at this time.

## 2019-08-06 ENCOUNTER — Encounter: Payer: Self-pay | Admitting: *Deleted

## 2019-08-14 ENCOUNTER — Other Ambulatory Visit
Admission: RE | Admit: 2019-08-14 | Discharge: 2019-08-14 | Disposition: A | Discharge status: Home / Self Care | Payer: 59 | Source: Ambulatory Visit | Attending: Internal Medicine | Admitting: Internal Medicine

## 2019-08-14 DIAGNOSIS — E1165 Type 2 diabetes mellitus with hyperglycemia: Secondary | ICD-10-CM | POA: Insufficient documentation

## 2019-08-14 DIAGNOSIS — E559 Vitamin D deficiency, unspecified: Secondary | ICD-10-CM | POA: Diagnosis not present

## 2019-08-14 LAB — HEMOGLOBIN A1C
Hgb A1c MFr Bld: 8.6 % — ABNORMAL HIGH (ref 4.8–5.6)
Mean Plasma Glucose: 200.12 mg/dL

## 2019-08-14 LAB — BASIC METABOLIC PANEL
Anion gap: 12 (ref 5–15)
BUN: 11 mg/dL (ref 6–20)
CO2: 23 mmol/L (ref 22–32)
Calcium: 8.8 mg/dL — ABNORMAL LOW (ref 8.9–10.3)
Chloride: 104 mmol/L (ref 98–111)
Creatinine, Ser: 0.81 mg/dL (ref 0.44–1.00)
GFR calc Af Amer: >60 mL/min (ref >60)
GFR calc non Af Amer: >60 mL/min (ref >60)
Glucose, Bld: 209 mg/dL — ABNORMAL HIGH (ref 70–99)
Potassium: 3.6 mmol/L (ref 3.5–5.1)
Sodium: 139 mmol/L (ref 135–145)

## 2019-08-14 LAB — VITAMIN D 25 HYDROXY (VIT D DEFICIENCY, FRACTURES): Vit D, 25-Hydroxy: 74.31 ng/mL (ref 30–100)

## 2019-08-17 DIAGNOSIS — G4733 Obstructive sleep apnea (adult) (pediatric): Secondary | ICD-10-CM | POA: Diagnosis not present

## 2019-08-18 DIAGNOSIS — E1169 Type 2 diabetes mellitus with other specified complication: Secondary | ICD-10-CM | POA: Diagnosis not present

## 2019-08-18 DIAGNOSIS — E669 Obesity, unspecified: Secondary | ICD-10-CM | POA: Diagnosis not present

## 2019-08-18 DIAGNOSIS — E1165 Type 2 diabetes mellitus with hyperglycemia: Secondary | ICD-10-CM | POA: Diagnosis not present

## 2019-09-16 ENCOUNTER — Other Ambulatory Visit: Payer: Self-pay | Admitting: Physician Assistant

## 2019-09-28 DIAGNOSIS — G4733 Obstructive sleep apnea (adult) (pediatric): Secondary | ICD-10-CM | POA: Diagnosis not present

## 2019-10-07 DIAGNOSIS — F3181 Bipolar II disorder: Secondary | ICD-10-CM | POA: Diagnosis not present

## 2019-10-07 DIAGNOSIS — F411 Generalized anxiety disorder: Secondary | ICD-10-CM | POA: Diagnosis not present

## 2019-10-08 ENCOUNTER — Other Ambulatory Visit: Payer: Self-pay | Admitting: Physician Assistant

## 2019-10-08 DIAGNOSIS — G43009 Migraine without aura, not intractable, without status migrainosus: Secondary | ICD-10-CM

## 2019-10-23 ENCOUNTER — Other Ambulatory Visit: Payer: Self-pay | Admitting: *Deleted

## 2019-10-23 ENCOUNTER — Other Ambulatory Visit: Payer: Self-pay | Admitting: Physician Assistant

## 2019-10-23 DIAGNOSIS — G43009 Migraine without aura, not intractable, without status migrainosus: Secondary | ICD-10-CM

## 2019-10-23 DIAGNOSIS — N6320 Unspecified lump in the left breast, unspecified quadrant: Secondary | ICD-10-CM | POA: Diagnosis not present

## 2019-10-23 DIAGNOSIS — N6310 Unspecified lump in the right breast, unspecified quadrant: Secondary | ICD-10-CM | POA: Diagnosis not present

## 2019-10-23 MED ORDER — NURTEC 75 MG PO TBDP: 75.0000 mg | ORAL_TABLET | Freq: Every day | ORAL | 1 refills | Status: DC | PRN

## 2019-10-23 NOTE — Progress Notes (Signed)
Pt requests phenergan and nurtec.  This is done.  KTC

## 2019-10-26 ENCOUNTER — Other Ambulatory Visit: Payer: Self-pay | Admitting: Obstetrics

## 2019-10-26 DIAGNOSIS — N6310 Unspecified lump in the right breast, unspecified quadrant: Secondary | ICD-10-CM

## 2019-11-03 ENCOUNTER — Other Ambulatory Visit: Payer: Self-pay | Admitting: Obstetrics

## 2019-11-03 ENCOUNTER — Other Ambulatory Visit: Payer: Self-pay

## 2019-11-03 ENCOUNTER — Ambulatory Visit
Admission: RE | Admit: 2019-11-03 | Discharge: 2019-11-03 | Disposition: A | Discharge status: Home / Self Care | Payer: 59 | Source: Ambulatory Visit | Attending: Obstetrics | Admitting: Obstetrics

## 2019-11-03 DIAGNOSIS — N6321 Unspecified lump in the left breast, upper outer quadrant: Secondary | ICD-10-CM | POA: Diagnosis not present

## 2019-11-03 DIAGNOSIS — N6310 Unspecified lump in the right breast, unspecified quadrant: Secondary | ICD-10-CM | POA: Diagnosis not present

## 2019-11-03 DIAGNOSIS — N6320 Unspecified lump in the left breast, unspecified quadrant: Secondary | ICD-10-CM

## 2019-11-03 DIAGNOSIS — R922 Inconclusive mammogram: Secondary | ICD-10-CM | POA: Diagnosis not present

## 2019-11-03 IMAGING — US US BREAST*R* LIMITED INC AXILLA
2 series · 6 of 6 positions shown · non-contrast
Comparison: Previous exam(s).

CLINICAL DATA: 48-year-old female with history of diffuse bilateral
breast pain and 2 palpable lumps on the right and 1 on the left. The
patient has strong family history of breast cancer including her
mother, maternal grandmother and paternal aunt.

EXAM:
DIGITAL DIAGNOSTIC BILATERAL MAMMOGRAM WITH CAD AND TOMO
BILATERAL BREAST ULTRASOUND

[Series 1: us breast*right* limited inc axilla · 0.06mm/px · 3 of 3 slices shown (1 of 2)]
[im 1/3]
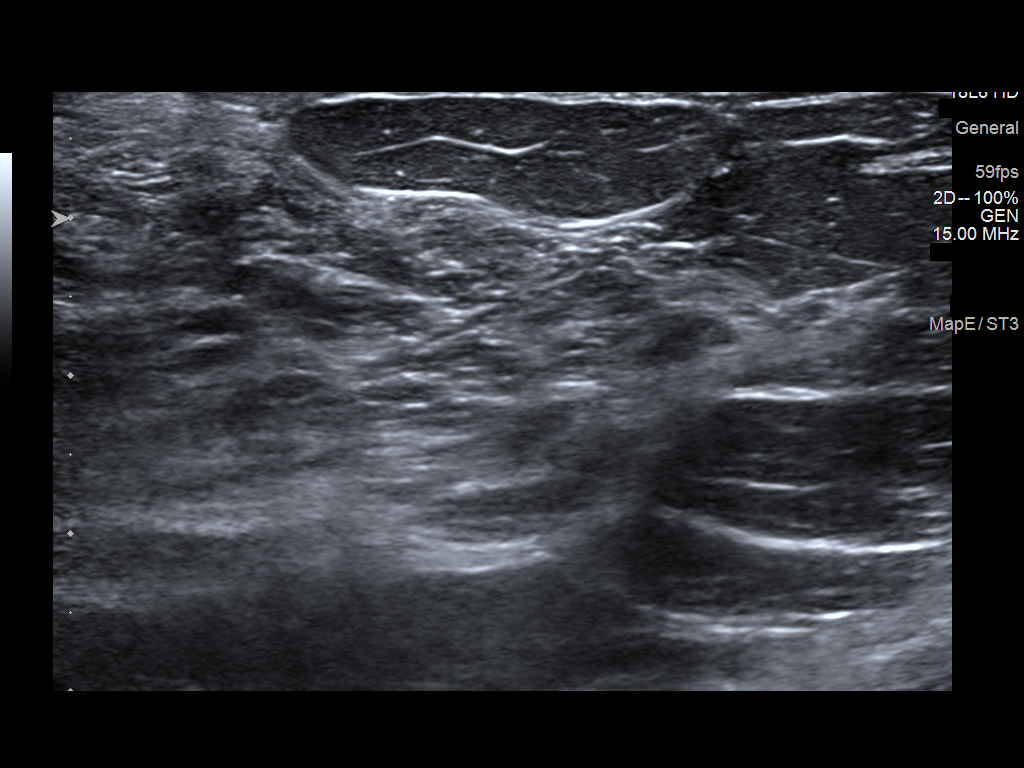
[im 2/3]
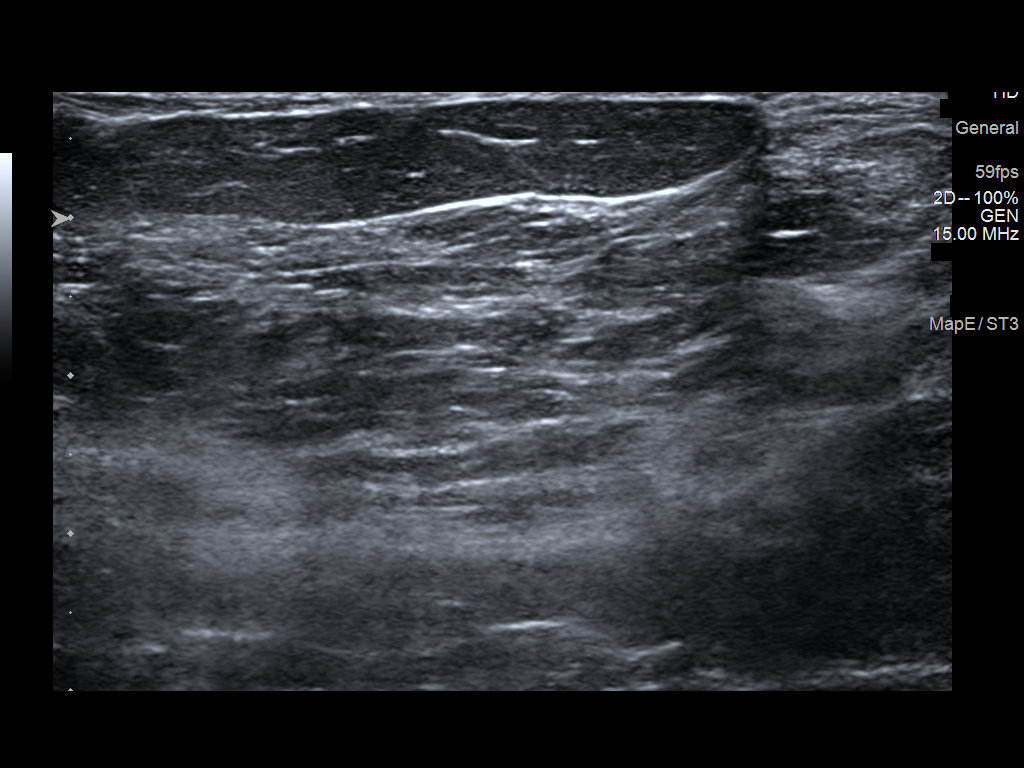
[im 3/3]
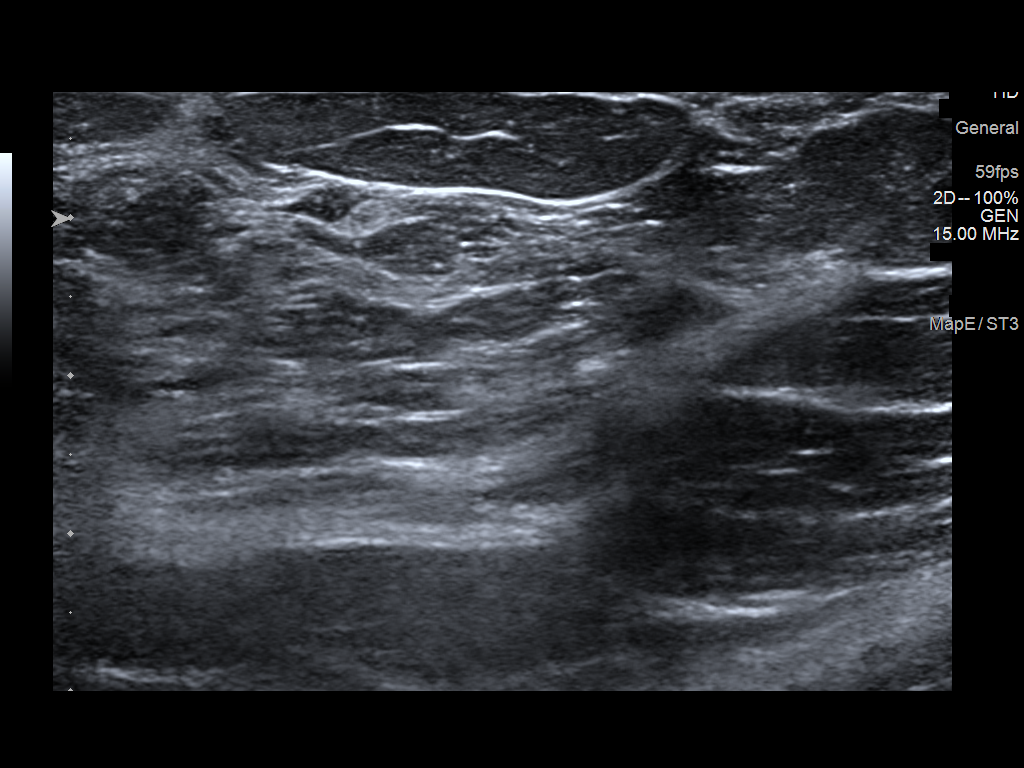

[Series 2: us breast*right* limited inc axilla · 0.07mm/px · 3 of 3 slices shown (2 of 2)]
[im 1/3]
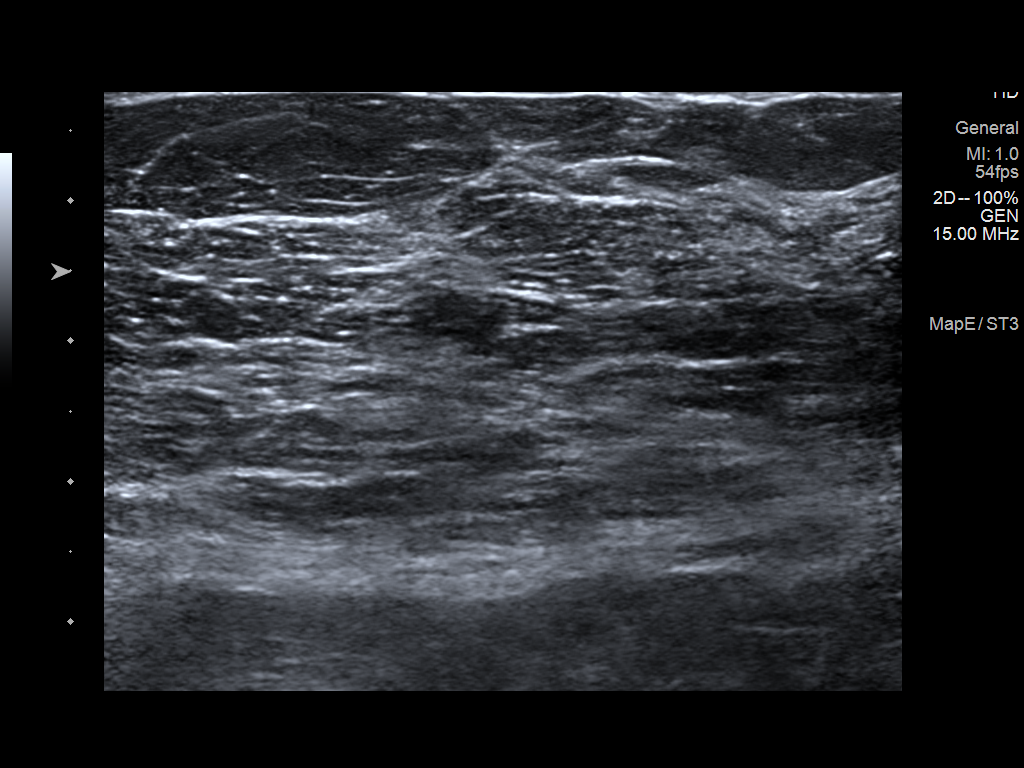
[im 2/3]
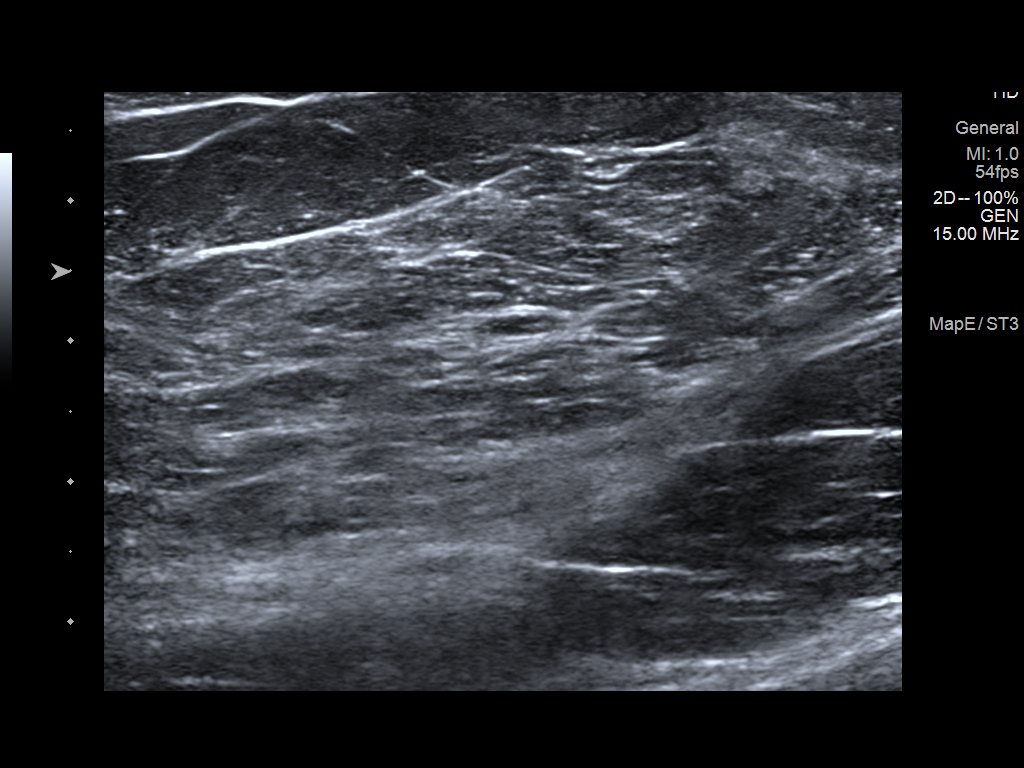
[im 3/3]
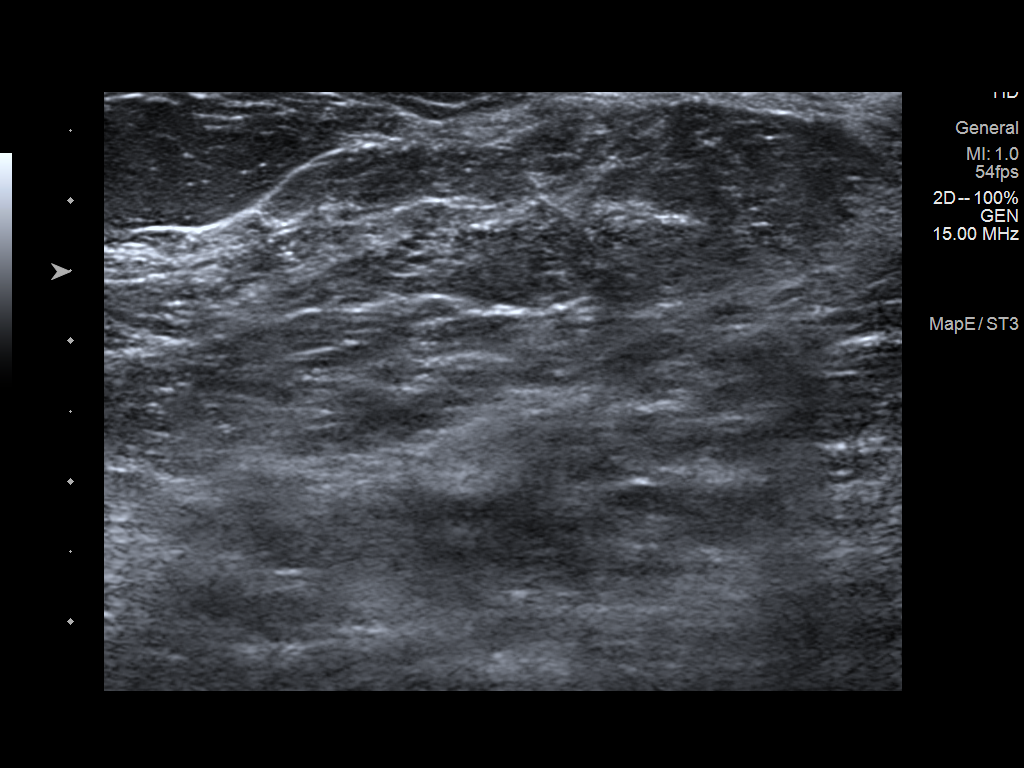

[6 of 6 positions shown; findings below may reference images not displayed]

ACR Breast Density Category c: The breast tissue is heterogeneously
dense, which may obscure small masses.
FINDINGS: Two BBs have been placed adjacent to the the nipple on the right and
superior and lateral to the nipple on the left indicating the
palpable sites of concern. No mammographic findings are identified
deep to these palpable markers. No suspicious calcifications, masses
or areas of distortion are seen in the bilateral breasts.

Mammographic images were processed with CAD.

Physical exam of the 2 palpable sites along the medial and lateral
right areola demonstrates a firm ridge of tissue without a discrete
palpable mass. Normal tissue is palpated along the lateral left
areola.

Ultrasound targeted to the right breast at 2 o'clock, 1 cm from the
nipple and at 10 o'clock, 2 cm from the nipple demonstrates normal
fibroglandular tissue. No suspicious masses or areas of shadowing
are identified.

Ultrasound targeted to the area of the palpable lump in the left
breast at 2 o'clock, 1 cm from the nipple demonstrates normal
fibroglandular tissue. No suspicious masses or areas of shadowing
are identified.
IMPRESSION: 1. There are no suspicious mammographic or targeted sonographic
abnormalities at the site of the bilateral palpable lumps.

2.  No mammographic evidence of malignancy in the bilateral breasts.

RECOMMENDATION:
1. Clinical follow-up recommended for the palpable areas of concern
in the bilateral breasts. Any further workup should be based on
clinical grounds.

2. Consider genetics assessment to determine the patient's lifetime
risk of breast cancer given her family history, if this has not
already been performed. Per American Cancer Society guidelines, if
the patient has a calculated lifetime risk of developing breast
cancer of greater than 20%, annual screening MRI of the breasts
would be recommended at the time of screening mammography.

I have discussed the findings and recommendations with the patient.
If applicable, a reminder letter will be sent to the patient
regarding the next appointment.

BI-RADS CATEGORY  1: Negative.

## 2019-11-03 IMAGING — US US BREAST*L* LIMITED INC AXILLA
1 series · 2 of 2 positions shown · non-contrast
Comparison: Previous exam(s).

CLINICAL DATA: 48-year-old female with history of diffuse bilateral
breast pain and 2 palpable lumps on the right and 1 on the left. The
patient has strong family history of breast cancer including her
mother, maternal grandmother and paternal aunt.

EXAM:
DIGITAL DIAGNOSTIC BILATERAL MAMMOGRAM WITH CAD AND TOMO
BILATERAL BREAST ULTRASOUND

[Series 1: us breast*left* limited inc axilla · 0.07mm/px · 2 of 2 slices shown]
[im 1/2]
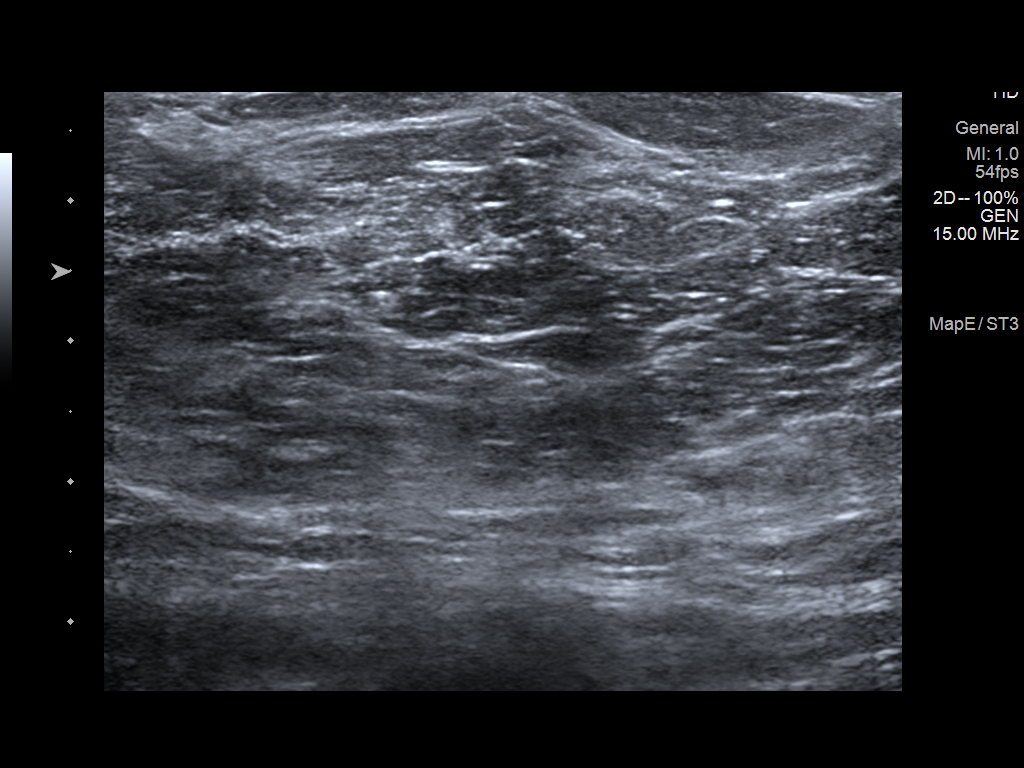
[im 2/2]
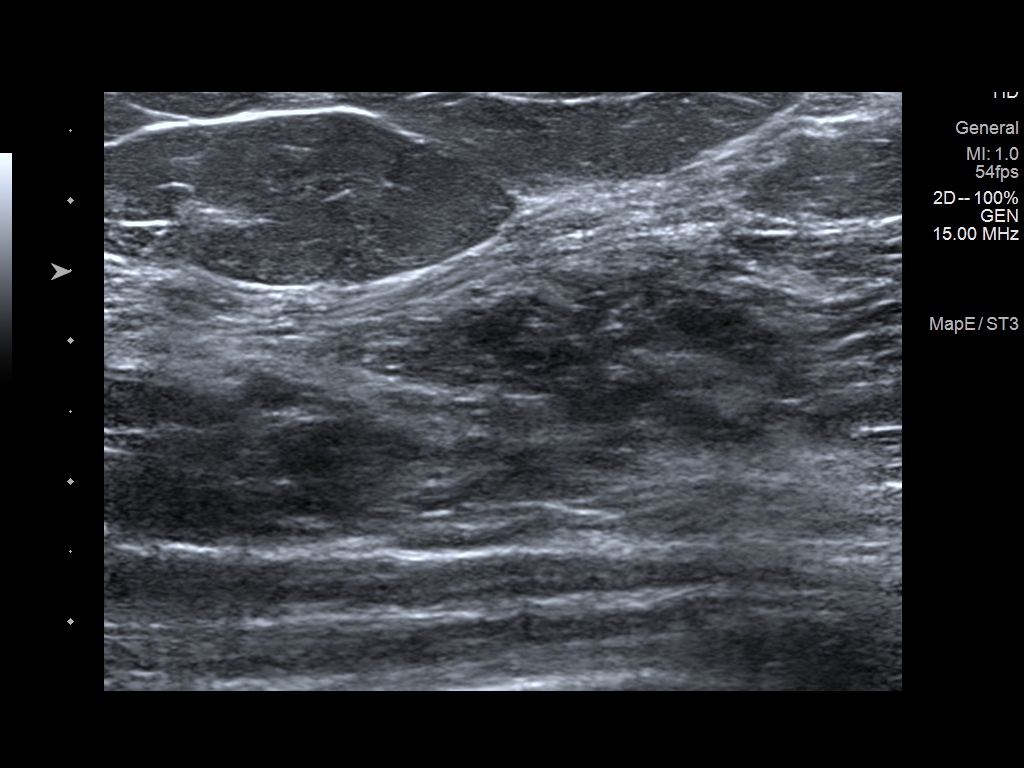

[2 of 2 positions shown; findings below may reference images not displayed]

ACR Breast Density Category c: The breast tissue is heterogeneously
dense, which may obscure small masses.
FINDINGS: Two BBs have been placed adjacent to the the nipple on the right and
superior and lateral to the nipple on the left indicating the
palpable sites of concern. No mammographic findings are identified
deep to these palpable markers. No suspicious calcifications, masses
or areas of distortion are seen in the bilateral breasts.

Mammographic images were processed with CAD.

Physical exam of the 2 palpable sites along the medial and lateral
right areola demonstrates a firm ridge of tissue without a discrete
palpable mass. Normal tissue is palpated along the lateral left
areola.

Ultrasound targeted to the right breast at 2 o'clock, 1 cm from the
nipple and at 10 o'clock, 2 cm from the nipple demonstrates normal
fibroglandular tissue. No suspicious masses or areas of shadowing
are identified.

Ultrasound targeted to the area of the palpable lump in the left
breast at 2 o'clock, 1 cm from the nipple demonstrates normal
fibroglandular tissue. No suspicious masses or areas of shadowing
are identified.
IMPRESSION: 1. There are no suspicious mammographic or targeted sonographic
abnormalities at the site of the bilateral palpable lumps.

2.  No mammographic evidence of malignancy in the bilateral breasts.

RECOMMENDATION:
1. Clinical follow-up recommended for the palpable areas of concern
in the bilateral breasts. Any further workup should be based on
clinical grounds.

2. Consider genetics assessment to determine the patient's lifetime
risk of breast cancer given her family history, if this has not
already been performed. Per American Cancer Society guidelines, if
the patient has a calculated lifetime risk of developing breast
cancer of greater than 20%, annual screening MRI of the breasts
would be recommended at the time of screening mammography.

I have discussed the findings and recommendations with the patient.
If applicable, a reminder letter will be sent to the patient
regarding the next appointment.

BI-RADS CATEGORY  1: Negative.

## 2019-11-03 IMAGING — MG DIGITAL DIAGNOSTIC BILAT W/ TOMO W/ CAD
6 of 12 series · 6 of 36 positions shown · non-contrast
Comparison: Previous exam(s).

CLINICAL DATA: 48-year-old female with history of diffuse bilateral
breast pain and 2 palpable lumps on the right and 1 on the left. The
patient has strong family history of breast cancer including her
mother, maternal grandmother and paternal aunt.

EXAM:
DIGITAL DIAGNOSTIC BILATERAL MAMMOGRAM WITH CAD AND TOMO
BILATERAL BREAST ULTRASOUND

[R MLO synth-2D (1 of 2)]
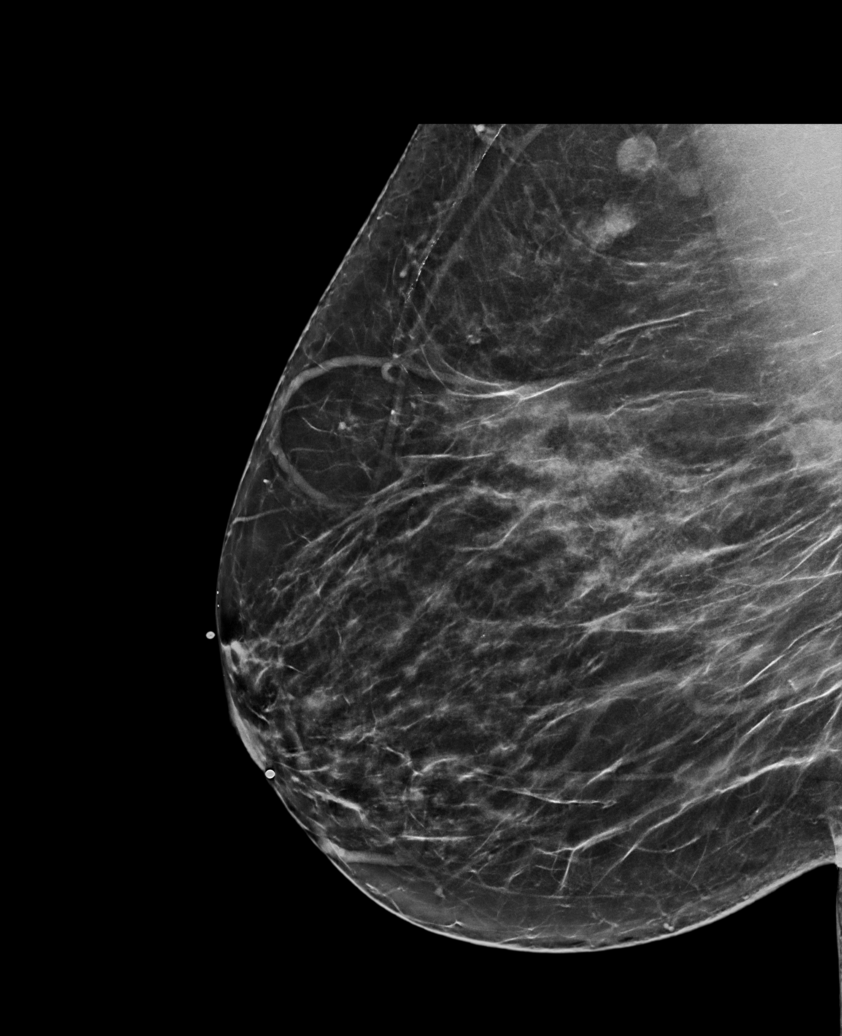

[L CC synth-2D (1 of 2)]
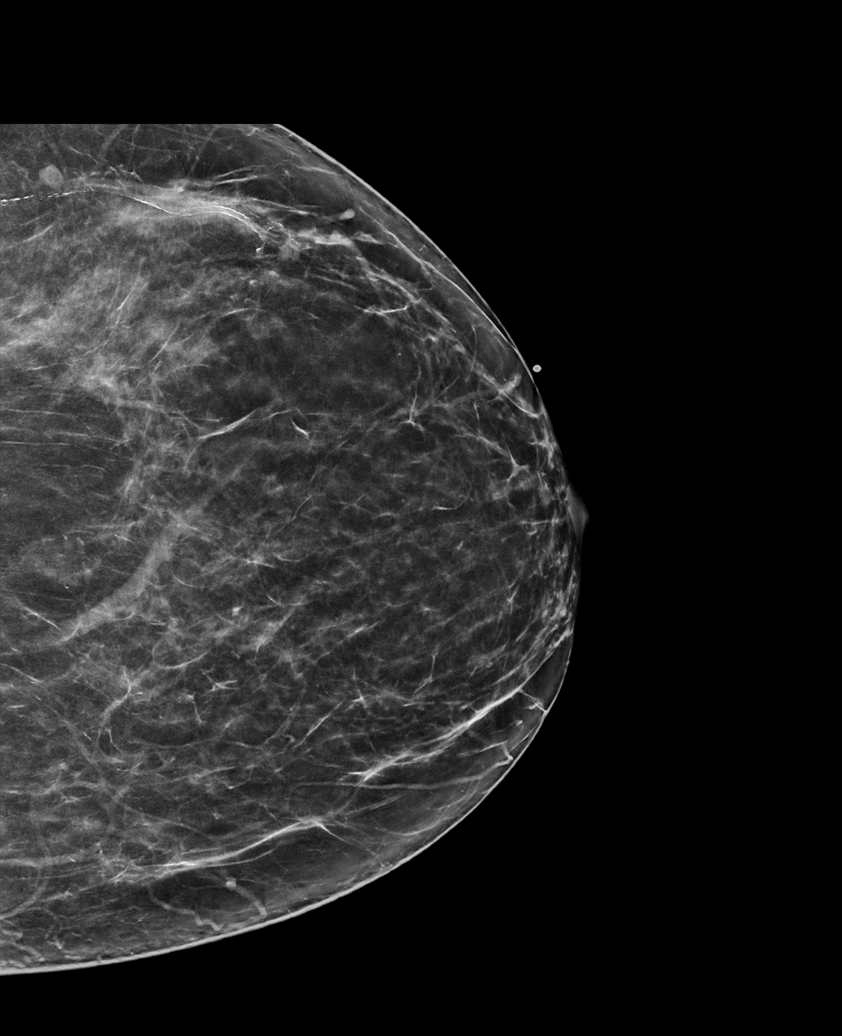

[L CC synth-2D (2 of 2)]
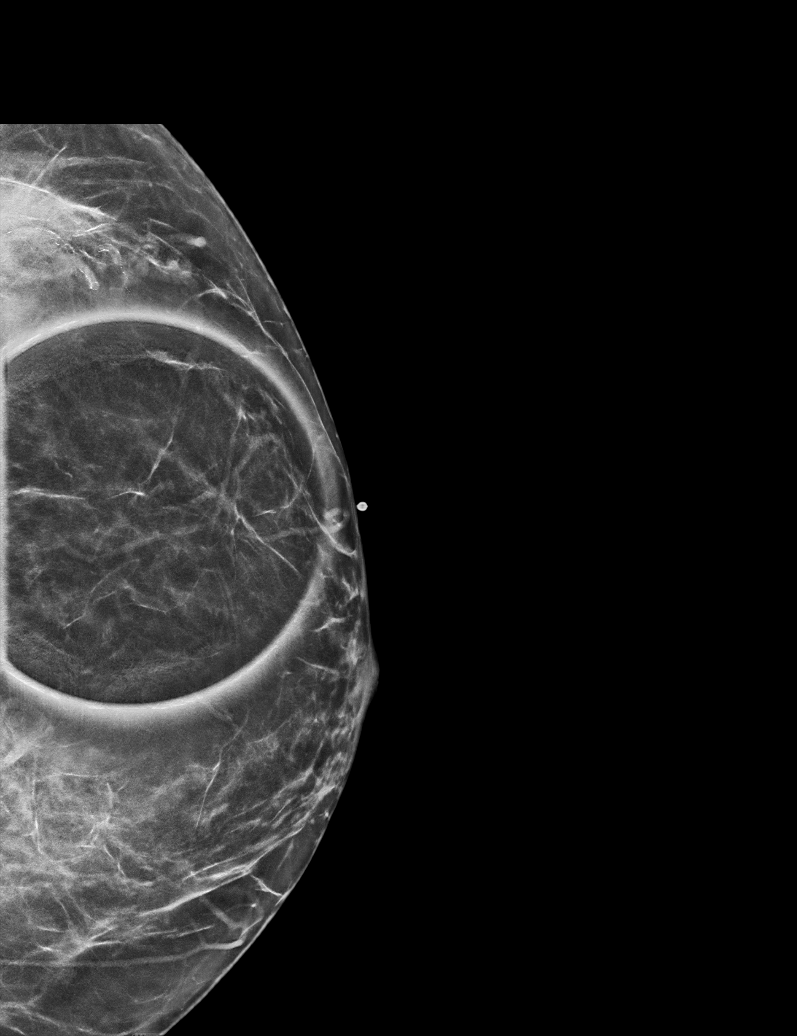

[R CC synth-2D]
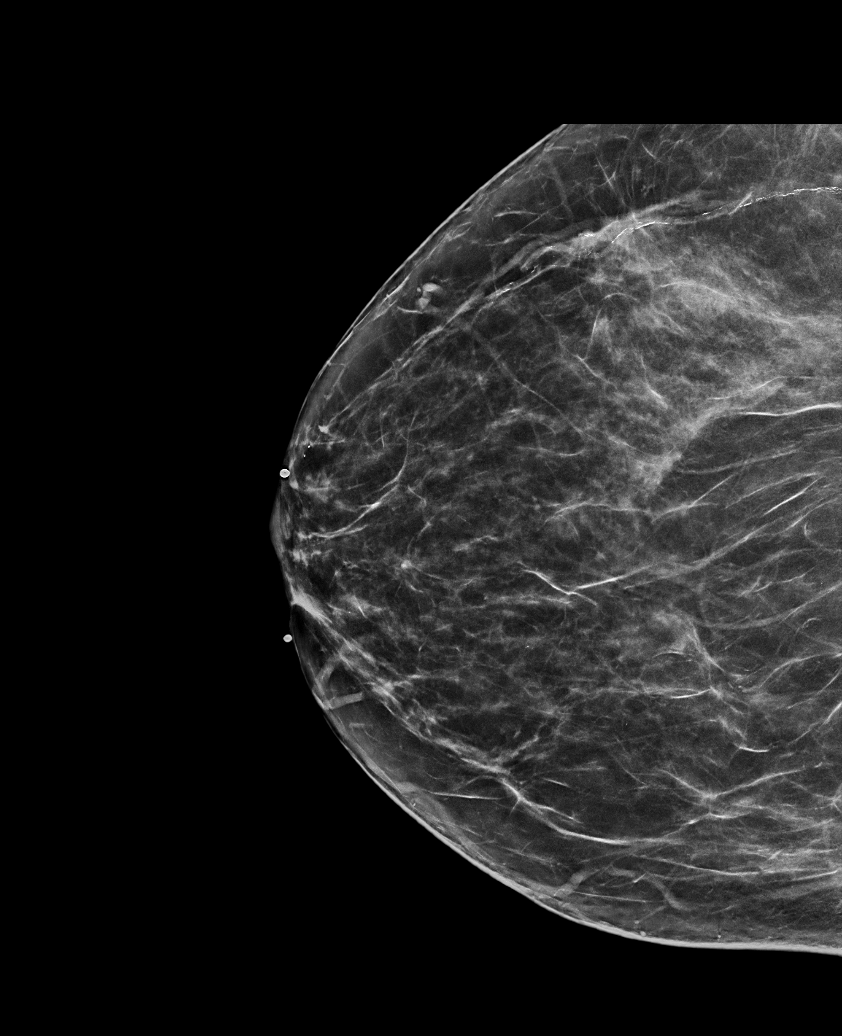

[R MLO synth-2D (2 of 2)]
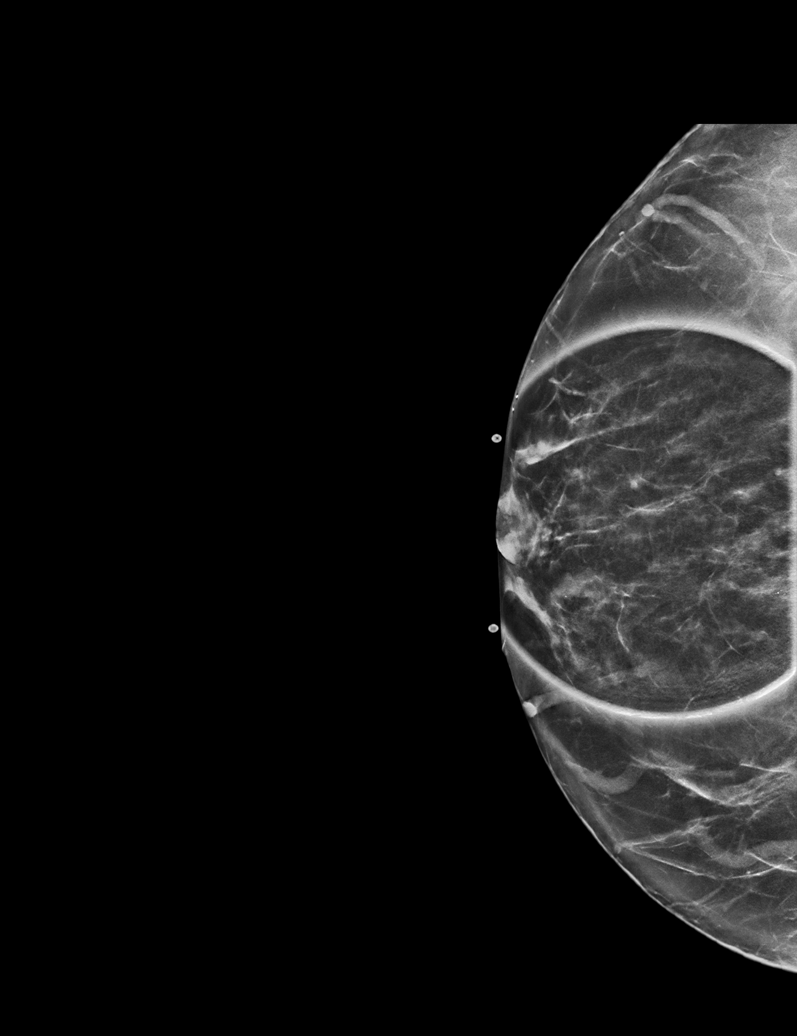

[L MLO synth-2D]
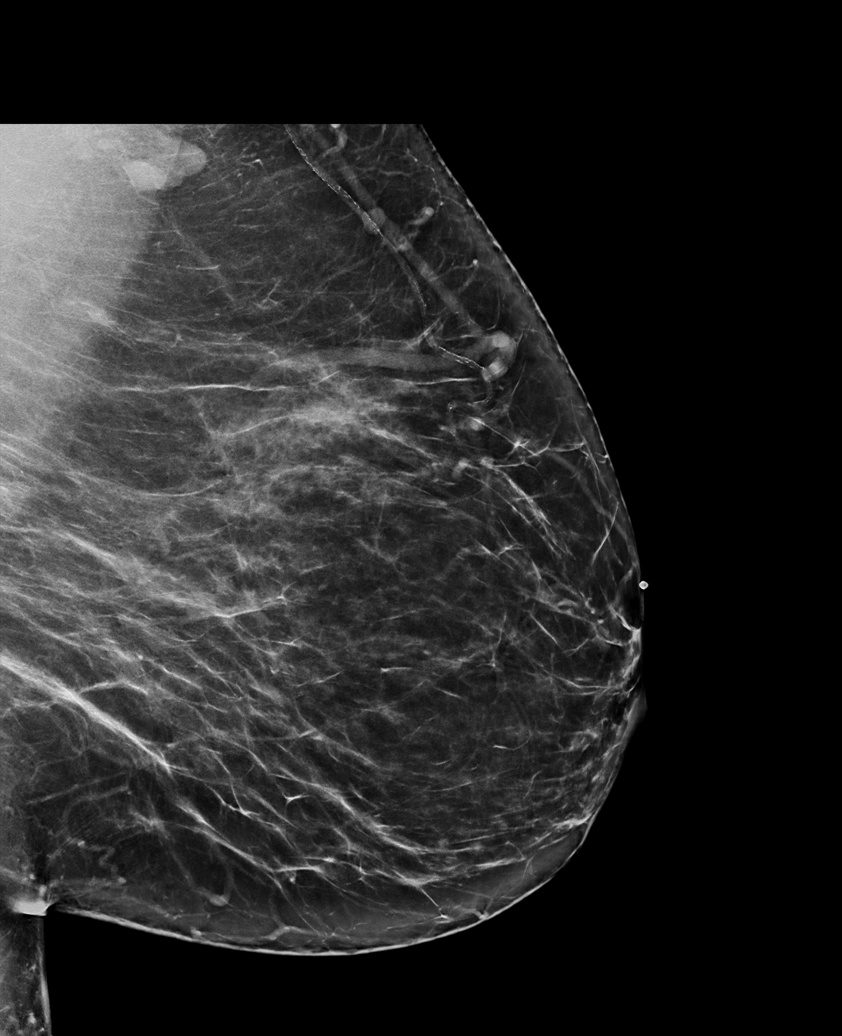

[6 of 36 positions shown; findings below may reference images not displayed]

ACR Breast Density Category c: The breast tissue is heterogeneously
dense, which may obscure small masses.
FINDINGS: Two BBs have been placed adjacent to the the nipple on the right and
superior and lateral to the nipple on the left indicating the
palpable sites of concern. No mammographic findings are identified
deep to these palpable markers. No suspicious calcifications, masses
or areas of distortion are seen in the bilateral breasts.

Mammographic images were processed with CAD.

Physical exam of the 2 palpable sites along the medial and lateral
right areola demonstrates a firm ridge of tissue without a discrete
palpable mass. Normal tissue is palpated along the lateral left
areola.

Ultrasound targeted to the right breast at 2 o'clock, 1 cm from the
nipple and at 10 o'clock, 2 cm from the nipple demonstrates normal
fibroglandular tissue. No suspicious masses or areas of shadowing
are identified.

Ultrasound targeted to the area of the palpable lump in the left
breast at 2 o'clock, 1 cm from the nipple demonstrates normal
fibroglandular tissue. No suspicious masses or areas of shadowing
are identified.
IMPRESSION: 1. There are no suspicious mammographic or targeted sonographic
abnormalities at the site of the bilateral palpable lumps.

2.  No mammographic evidence of malignancy in the bilateral breasts.

RECOMMENDATION:
1. Clinical follow-up recommended for the palpable areas of concern
in the bilateral breasts. Any further workup should be based on
clinical grounds.

2. Consider genetics assessment to determine the patient's lifetime
risk of breast cancer given her family history, if this has not
already been performed. Per American Cancer Society guidelines, if
the patient has a calculated lifetime risk of developing breast
cancer of greater than 20%, annual screening MRI of the breasts
would be recommended at the time of screening mammography.

I have discussed the findings and recommendations with the patient.
If applicable, a reminder letter will be sent to the patient
regarding the next appointment.

BI-RADS CATEGORY  1: Negative.

## 2019-11-16 DIAGNOSIS — G4733 Obstructive sleep apnea (adult) (pediatric): Secondary | ICD-10-CM | POA: Diagnosis not present

## 2019-11-17 ENCOUNTER — Other Ambulatory Visit: Payer: Self-pay | Admitting: Gastroenterology

## 2019-11-17 DIAGNOSIS — R140 Abdominal distension (gaseous): Secondary | ICD-10-CM | POA: Diagnosis not present

## 2019-11-17 DIAGNOSIS — K5904 Chronic idiopathic constipation: Secondary | ICD-10-CM | POA: Diagnosis not present

## 2019-11-17 DIAGNOSIS — K219 Gastro-esophageal reflux disease without esophagitis: Secondary | ICD-10-CM | POA: Diagnosis not present

## 2019-12-23 ENCOUNTER — Other Ambulatory Visit: Payer: Self-pay | Admitting: Gastroenterology

## 2019-12-23 ENCOUNTER — Other Ambulatory Visit
Admission: RE | Admit: 2019-12-23 | Discharge: 2019-12-23 | Disposition: A | Discharge status: Home / Self Care | Payer: 59 | Source: Ambulatory Visit | Attending: Internal Medicine | Admitting: Internal Medicine

## 2019-12-23 DIAGNOSIS — E1165 Type 2 diabetes mellitus with hyperglycemia: Secondary | ICD-10-CM | POA: Insufficient documentation

## 2019-12-23 DIAGNOSIS — E559 Vitamin D deficiency, unspecified: Secondary | ICD-10-CM | POA: Insufficient documentation

## 2019-12-23 DIAGNOSIS — F411 Generalized anxiety disorder: Secondary | ICD-10-CM | POA: Diagnosis not present

## 2019-12-23 DIAGNOSIS — F3181 Bipolar II disorder: Secondary | ICD-10-CM | POA: Diagnosis not present

## 2019-12-23 LAB — BASIC METABOLIC PANEL
Anion gap: 9 (ref 5–15)
BUN: 14 mg/dL (ref 6–20)
CO2: 23 mmol/L (ref 22–32)
Calcium: 8.8 mg/dL — ABNORMAL LOW (ref 8.9–10.3)
Chloride: 103 mmol/L (ref 98–111)
Creatinine, Ser: 0.92 mg/dL (ref 0.44–1.00)
GFR calc Af Amer: >60 mL/min (ref >60)
GFR calc non Af Amer: >60 mL/min (ref >60)
Glucose, Bld: 254 mg/dL — ABNORMAL HIGH (ref 70–99)
Potassium: 3.5 mmol/L (ref 3.5–5.1)
Sodium: 135 mmol/L (ref 135–145)

## 2019-12-23 LAB — LIPID PANEL
Cholesterol: 235 mg/dL — ABNORMAL HIGH (ref 0–200)
HDL: 60 mg/dL (ref >40)
LDL Cholesterol: UNDETERMINED mg/dL (ref 0–99)
Total CHOL/HDL Ratio: 3.9 RATIO
Triglycerides: 428 mg/dL — ABNORMAL HIGH (ref <150)
VLDL: UNDETERMINED mg/dL (ref 0–40)

## 2019-12-23 LAB — LDL CHOLESTEROL, DIRECT: Direct LDL: 121.5 mg/dL — ABNORMAL HIGH (ref 0–99)

## 2019-12-23 LAB — VITAMIN D 25 HYDROXY (VIT D DEFICIENCY, FRACTURES): Vit D, 25-Hydroxy: 40.55 ng/mL (ref 30–100)

## 2019-12-23 LAB — HEMOGLOBIN A1C
Hgb A1c MFr Bld: 7.9 % — ABNORMAL HIGH (ref 4.8–5.6)
Mean Plasma Glucose: 180.03 mg/dL

## 2019-12-24 DIAGNOSIS — Z794 Long term (current) use of insulin: Secondary | ICD-10-CM | POA: Diagnosis not present

## 2019-12-24 DIAGNOSIS — E119 Type 2 diabetes mellitus without complications: Secondary | ICD-10-CM | POA: Diagnosis not present

## 2019-12-24 DIAGNOSIS — I10 Essential (primary) hypertension: Secondary | ICD-10-CM | POA: Diagnosis not present

## 2019-12-24 DIAGNOSIS — M79671 Pain in right foot: Secondary | ICD-10-CM | POA: Diagnosis not present

## 2019-12-24 DIAGNOSIS — E782 Mixed hyperlipidemia: Secondary | ICD-10-CM | POA: Diagnosis not present

## 2019-12-24 DIAGNOSIS — G4733 Obstructive sleep apnea (adult) (pediatric): Secondary | ICD-10-CM | POA: Diagnosis not present

## 2019-12-24 MED FILL — FENOFIBRATE 48 MG TABLET: 48 | 30 days supply | Qty: 30 | Fill #0

## 2019-12-24 MED FILL — MELOXICAM 15 MG TABLET: 15 | 30 days supply | Qty: 30 | Fill #0

## 2019-12-29 DIAGNOSIS — Z803 Family history of malignant neoplasm of breast: Secondary | ICD-10-CM | POA: Diagnosis not present

## 2019-12-29 DIAGNOSIS — N6310 Unspecified lump in the right breast, unspecified quadrant: Secondary | ICD-10-CM | POA: Diagnosis not present

## 2020-01-07 ENCOUNTER — Other Ambulatory Visit: Payer: Self-pay | Admitting: Physician Assistant

## 2020-01-08 DIAGNOSIS — E1165 Type 2 diabetes mellitus with hyperglycemia: Secondary | ICD-10-CM | POA: Diagnosis not present

## 2020-01-08 DIAGNOSIS — E669 Obesity, unspecified: Secondary | ICD-10-CM | POA: Diagnosis not present

## 2020-01-08 DIAGNOSIS — E1169 Type 2 diabetes mellitus with other specified complication: Secondary | ICD-10-CM | POA: Diagnosis not present

## 2020-01-15 ENCOUNTER — Other Ambulatory Visit: Payer: Self-pay | Admitting: Physician Assistant

## 2020-01-27 ENCOUNTER — Other Ambulatory Visit: Payer: Self-pay | Admitting: Physician Assistant

## 2020-02-01 ENCOUNTER — Other Ambulatory Visit: Payer: Self-pay | Admitting: Physician Assistant

## 2020-02-02 ENCOUNTER — Other Ambulatory Visit: Payer: Self-pay | Admitting: *Deleted

## 2020-02-02 ENCOUNTER — Other Ambulatory Visit: Payer: Self-pay | Admitting: Physician Assistant

## 2020-02-02 ENCOUNTER — Encounter: Payer: Self-pay | Admitting: *Deleted

## 2020-02-02 DIAGNOSIS — G43009 Migraine without aura, not intractable, without status migrainosus: Secondary | ICD-10-CM

## 2020-02-02 MED ORDER — PROMETHAZINE HCL 25 MG PO TABS: 25.0000 mg | ORAL_TABLET | Freq: Four times a day (QID) | ORAL | 1 refills | Status: DC | PRN

## 2020-02-02 MED ORDER — BACLOFEN 10 MG PO TABS: 10.0000 mg | ORAL_TABLET | Freq: Every day | ORAL | 1 refills | Status: DC | PRN

## 2020-02-02 MED ORDER — EMGALITY 120 MG/ML ~~LOC~~ SOAJ: SUBCUTANEOUS | 5 refills | Status: DC

## 2020-02-08 ENCOUNTER — Encounter: Payer: Self-pay | Admitting: *Deleted

## 2020-02-10 ENCOUNTER — Encounter: Payer: Self-pay | Admitting: *Deleted

## 2020-02-15 DIAGNOSIS — G4733 Obstructive sleep apnea (adult) (pediatric): Secondary | ICD-10-CM | POA: Diagnosis not present

## 2020-02-23 DIAGNOSIS — F411 Generalized anxiety disorder: Secondary | ICD-10-CM | POA: Diagnosis not present

## 2020-02-23 DIAGNOSIS — F3181 Bipolar II disorder: Secondary | ICD-10-CM | POA: Diagnosis not present

## 2020-02-26 ENCOUNTER — Other Ambulatory Visit: Payer: Self-pay

## 2020-02-26 ENCOUNTER — Ambulatory Visit: Payer: 59 | Admitting: Physician Assistant

## 2020-02-26 ENCOUNTER — Encounter: Payer: Self-pay | Admitting: Physician Assistant

## 2020-02-26 VITALS — BP 105/69 | HR 91 | Wt 221.0 lb

## 2020-02-26 DIAGNOSIS — M62838 Other muscle spasm: Secondary | ICD-10-CM

## 2020-02-26 DIAGNOSIS — G43009 Migraine without aura, not intractable, without status migrainosus: Secondary | ICD-10-CM

## 2020-02-26 MED ORDER — AJOVY 225 MG/1.5ML ~~LOC~~ SOAJ: 675.0000 mg | SUBCUTANEOUS | 3 refills | Status: DC

## 2020-02-26 MED ORDER — UBRELVY 100 MG PO TABS: 100.0000 mg | ORAL_TABLET | ORAL | 3 refills | Status: DC | PRN

## 2020-02-26 MED ORDER — BACLOFEN 10 MG PO TABS: 10.0000 mg | ORAL_TABLET | Freq: Every day | ORAL | 1 refills | Status: DC | PRN

## 2020-02-26 NOTE — Progress Notes (Signed)
History:  Julie Hansen is a 49 y.o. [id number] who presents to clinic today for headache.  Headaches have been severe and lasting up to 4 days, even with treatment (Nurtec).  She is getting nauseous and requiring phenergan.  She is able to use Phenergan and Baclofen without sedation as a side effect.  She states she is able to work with use of these medications.  She is uncertain if Nurtec is helpful as the headache goes on for days.  She reports taking it early in the headache.   The emgality is being taken about a week late but she is taking it with better regularity.  She states she is due to take it now.  She states overall the headaches are less frequent and more severe. She uses baclofen with headache pain only.    HIT6:60 Number of days in the last 4 weeks with:  Severe headache:5 Moderate headache: 4 Mild headache: 1  No headache: 18   Past Medical History:  Diagnosis Date  . Acid reflux   . Asthma   . Diabetes mellitus without complication ([place])   . Hyperlipidemia   . PCOS (polycystic ovarian syndrome)   . PONV (postoperative nausea and vomiting)     Social History   Socioeconomic History  . Marital status: Married    Spouse name: Not on file  . Number of children: Not on file  . Years of education: Not on file  . Highest education level: Not on file  Occupational History  . Not on file  Tobacco Use  . Smoking status: Never Smoker  . Smokeless tobacco: Never Used  Substance and Sexual Activity  . Alcohol use: Yes    Alcohol/week: 6.0 standard drinks    Types: 6 Standard drinks or equivalent per week    Comment: 1-2 every 2-3 weeks  . Drug use: No  . Sexual activity: Yes    Birth control/protection: None  Other Topics Concern  . Not on file  Social History Narrative  . Not on file   Social Determinants of Health   Financial Resource Strain:   . Difficulty of Paying Living Expenses:   Food Insecurity:   . Worried About Charity fundraiser in the  Last Year:   . Arboriculturist in the Last Year:   Transportation Needs:   . Film/video editor (Medical):   Marland Kitchen Lack of Transportation (Non-Medical):   Physical Activity:   . Days of Exercise per Week:   . Minutes of Exercise per Session:   Stress:   . Feeling of Stress :   Social Connections:   . Frequency of Communication with Friends and Family:   . Frequency of Social Gatherings with Friends and Family:   . Attends Religious Services:   . Active Member of Clubs or Organizations:   . Attends Archivist Meetings:   Marland Kitchen Marital Status:   Intimate Partner Violence:   . Fear of Current or Ex-Partner:   . Emotionally Abused:   Marland Kitchen Physically Abused:   . Sexually Abused:     Family History  Problem Relation Age of Onset  . Cancer Mother   . Hypertension Mother   . Diabetes Mother   . Breast cancer Mother 37  . Cancer Maternal Grandmother   . Breast cancer Maternal Grandmother 50  . Cancer Father   . Diabetes Father   . Hypertension Father   . Breast cancer Paternal Aunt 28  . Breast cancer Paternal Aunt 45  .  Breast cancer Paternal Aunt 57    Allergies  Allergen Reactions  . Hydrocodone-Homatropine Itching  . Macrodantin [Nitrofurantoin Macrocrystal] Itching  . Carbamazepine Other (See Comments)    Vaginal discharge    Current Outpatient Medications on File Prior to Visit  Medication Sig Dispense Refill  . albuterol (PROVENTIL) (2.5 MG/3ML) 0.083% nebulizer solution Take 2.5 mg by nebulization every 6 (six) hours as needed.    . ALPRAZolam (XANAX) 1 MG tablet Take 0.5 mg by mouth 2 (two) times daily as needed for anxiety.     Marland Kitchen atorvastatin (LIPITOR) 10 MG tablet Take 10 mg by mouth daily.    . baclofen (LIORESAL) 10 MG tablet Take 1 tablet (10 mg total) by mouth daily as needed. 30 tablet 1  . beclomethasone (QVAR) 40 MCG/ACT inhaler Inhale 2 puffs into the lungs 2 (two) times daily. prn    . Galcanezumab-gnlm (EMGALITY) 120 MG/ML SOAJ Inject 240 mg into  the skin as directed AND 120 mg every 30 (thirty) days. Inj 240mg  once then 120mg  monthly. 1 pen 5  . ibuprofen (ADVIL,MOTRIN) 800 MG tablet Take 1 tablet (800 mg total) by mouth every 8 (eight) hours as needed. 60 tablet 3  . lisinopril (PRINIVIL,ZESTRIL) 5 MG tablet Take 5 mg by mouth daily.    . NURTEC 75 MG TBDP TAKE 1 TABLET BY MOUTH DAILY AS NEEDED. 8 tablet 1  . promethazine (PHENERGAN) 25 MG tablet Take 1 tablet (25 mg total) by mouth every 6 (six) hours as needed for nausea or vomiting. 30 tablet 1  . Vitamin D, Ergocalciferol, (DRISDOL) 1.25 MG (50000 UT) CAPS capsule     . zolpidem (AMBIEN) 10 MG tablet Take 10 mg by mouth at bedtime as needed. Reported on 10/17/2015    . glimepiride (AMARYL) 4 MG tablet Take 2 mg by mouth 2 (two) times daily.      No current facility-administered medications on file prior to visit.     Review of Systems:  All pertinent positive/negative included in HPI, all other review of systems are negative   Objective:  Physical Exam BP 105/69   Pulse 91   Wt 221 lb (100.2 kg)   BMI 37.93 kg/m  CONSTITUTIONAL: Well-developed, well-nourished female in no acute distress.  EYES: EOM intact ENT: Normocephalic CARDIOVASCULAR: Regular rate  RESPIRATORY: Normal rate.  MUSCULOSKELETAL: Normal ROM SKIN: Warm, dry without erythema  NEUROLOGICAL: Alert, oriented, CN II-XII grossly intact, Appropriate balance   PSYCH: Normal behavior, mood   Assessment & Plan:  Assessment: Worsening of migraine headaches Continued muscle spasm  Plan: Advised to use baclofen more liberally - including for muscle spasm unrelated to headache Will change to using Ajovy in place of Emgality.  She will use 3 injections every 3 months in hopes of better compliance. She will trial Ubrelvy in place of Nurtec in hopes of more efficacy.   Follow-up in 3 months or sooner PRN She is advised not to wait so long to be seen particularly if the medicine is not helping sufficiently.  No  need to suffer.  Paticia Stack, PA-C 02/26/2020 9:07 AM

## 2020-02-26 NOTE — Patient Instructions (Signed)

## 2020-03-02 ENCOUNTER — Encounter: Payer: Self-pay | Admitting: *Deleted

## 2020-03-18 DIAGNOSIS — G4733 Obstructive sleep apnea (adult) (pediatric): Secondary | ICD-10-CM | POA: Diagnosis not present

## 2020-03-21 DIAGNOSIS — J4520 Mild intermittent asthma, uncomplicated: Secondary | ICD-10-CM | POA: Diagnosis not present

## 2020-03-21 DIAGNOSIS — I152 Hypertension secondary to endocrine disorders: Secondary | ICD-10-CM | POA: Diagnosis not present

## 2020-03-21 DIAGNOSIS — E1159 Type 2 diabetes mellitus with other circulatory complications: Secondary | ICD-10-CM | POA: Diagnosis not present

## 2020-03-21 DIAGNOSIS — R1031 Right lower quadrant pain: Secondary | ICD-10-CM | POA: Diagnosis not present

## 2020-03-21 DIAGNOSIS — E119 Type 2 diabetes mellitus without complications: Secondary | ICD-10-CM | POA: Diagnosis not present

## 2020-03-21 DIAGNOSIS — Z794 Long term (current) use of insulin: Secondary | ICD-10-CM | POA: Diagnosis not present

## 2020-03-21 DIAGNOSIS — G4733 Obstructive sleep apnea (adult) (pediatric): Secondary | ICD-10-CM | POA: Diagnosis not present

## 2020-03-21 DIAGNOSIS — E1169 Type 2 diabetes mellitus with other specified complication: Secondary | ICD-10-CM | POA: Diagnosis not present

## 2020-03-21 DIAGNOSIS — R300 Dysuria: Secondary | ICD-10-CM | POA: Diagnosis not present

## 2020-03-21 DIAGNOSIS — N912 Amenorrhea, unspecified: Secondary | ICD-10-CM | POA: Diagnosis not present

## 2020-03-21 DIAGNOSIS — R8290 Unspecified abnormal findings in urine: Secondary | ICD-10-CM | POA: Diagnosis not present

## 2020-03-22 DIAGNOSIS — N83519 Torsion of ovary and ovarian pedicle, unspecified side: Secondary | ICD-10-CM | POA: Diagnosis not present

## 2020-03-22 DIAGNOSIS — D259 Leiomyoma of uterus, unspecified: Secondary | ICD-10-CM | POA: Diagnosis not present

## 2020-03-22 DIAGNOSIS — N83201 Unspecified ovarian cyst, right side: Secondary | ICD-10-CM | POA: Diagnosis not present

## 2020-03-22 DIAGNOSIS — N858 Other specified noninflammatory disorders of uterus: Secondary | ICD-10-CM | POA: Diagnosis not present

## 2020-03-22 DIAGNOSIS — N83291 Other ovarian cyst, right side: Secondary | ICD-10-CM | POA: Diagnosis not present

## 2020-03-22 DIAGNOSIS — D175 Benign lipomatous neoplasm of intra-abdominal organs: Secondary | ICD-10-CM | POA: Diagnosis not present

## 2020-03-31 DIAGNOSIS — G4733 Obstructive sleep apnea (adult) (pediatric): Secondary | ICD-10-CM | POA: Diagnosis not present

## 2020-04-04 ENCOUNTER — Other Ambulatory Visit: Payer: Self-pay | Admitting: General Surgery

## 2020-04-04 DIAGNOSIS — Z803 Family history of malignant neoplasm of breast: Secondary | ICD-10-CM

## 2020-04-11 ENCOUNTER — Other Ambulatory Visit: Payer: Self-pay | Admitting: Internal Medicine

## 2020-04-12 DIAGNOSIS — F411 Generalized anxiety disorder: Secondary | ICD-10-CM | POA: Diagnosis not present

## 2020-04-12 DIAGNOSIS — F3181 Bipolar II disorder: Secondary | ICD-10-CM | POA: Diagnosis not present

## 2020-04-28 ENCOUNTER — Ambulatory Visit
Admission: RE | Admit: 2020-04-28 | Discharge: 2020-04-28 | Disposition: A | Discharge status: Home / Self Care | Payer: 59 | Source: Ambulatory Visit | Attending: General Surgery | Admitting: General Surgery

## 2020-04-28 ENCOUNTER — Other Ambulatory Visit: Payer: Self-pay

## 2020-04-28 DIAGNOSIS — Z803 Family history of malignant neoplasm of breast: Secondary | ICD-10-CM

## 2020-04-28 DIAGNOSIS — N6312 Unspecified lump in the right breast, upper inner quadrant: Secondary | ICD-10-CM | POA: Diagnosis not present

## 2020-04-28 IMAGING — MR MR BREAST BILAT WO/W CM
8 of 13 series · 33 of 48 positions shown · IV contrast (10 ml gadavist)
Comparison: [DATE] and earlier

CLINICAL DATA: Family history of breast cancer. Patient's mother
was diagnosed at age 50. Patient's maternal grandmother and 3
paternal aunts were diagnosed with breast cancer at an unknown age.
Patient is currently asymptomatic.

LABS:  None obtained at the time of imaging.
EXAM:
BILATERAL BREAST MRI WITH AND WITHOUT CONTRAST
TECHNIQUE: Multiplanar, multisequence MR images of both breasts were obtained
prior to and following the intravenous administration of 10 ml of
Gadavist

[Series 2: t2_tirm_tra ipat (a-p) · axial · 3.0mm · 0.70mm/px · 1 of 60 slices shown]
[im 1/60]
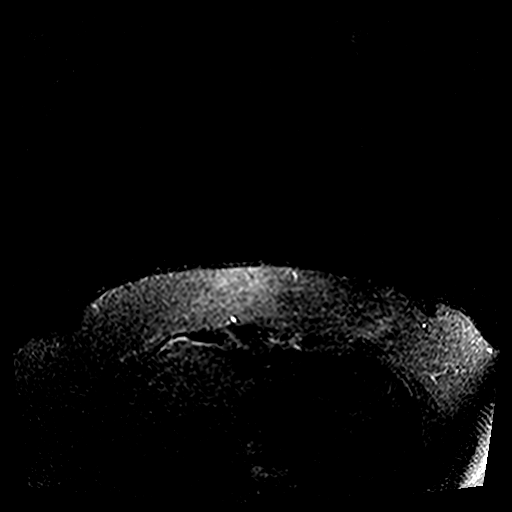

[Series 3: fl3d pre-cm no · axial · non-contrast · 1.2mm · 0.94mm/px · z∈[-72,+100]mm · 5 of 144 slices shown]
[im 1/144]
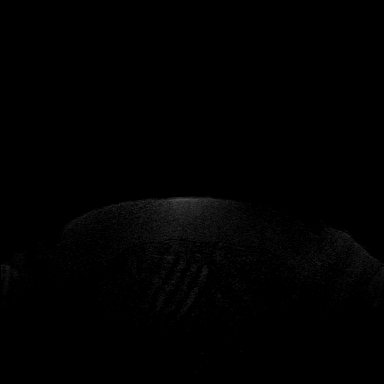
[im 36/144]
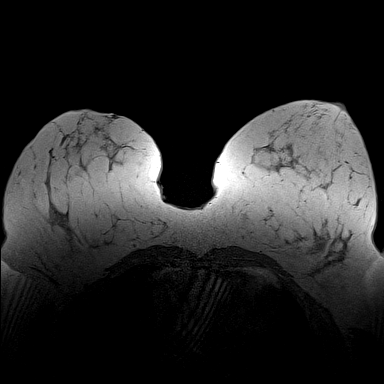
[im 72/144]
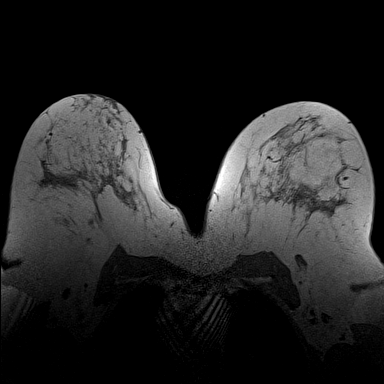
[im 108/144]
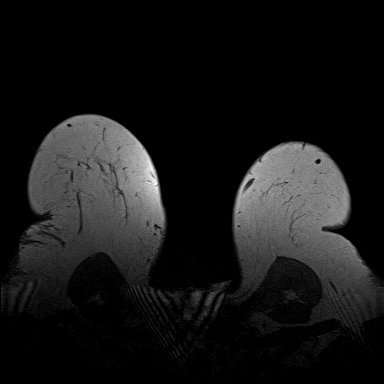
[im 144/144]
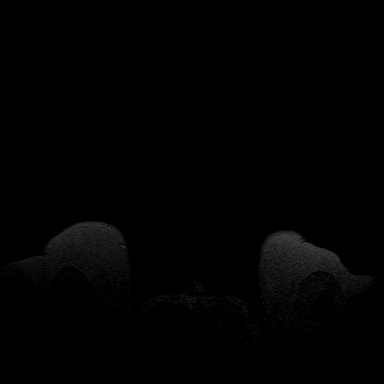

[Series 4: fl3d pre-cm · axial · non-contrast · 1.2mm · 0.94mm/px · z∈[-72,+100]mm · 5 of 144 slices shown]
[im 1/144]
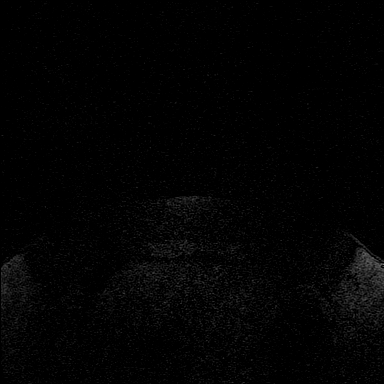
[im 36/144]
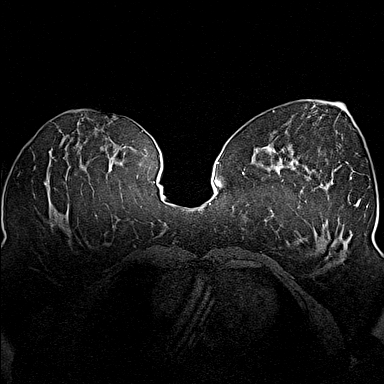
[im 72/144]
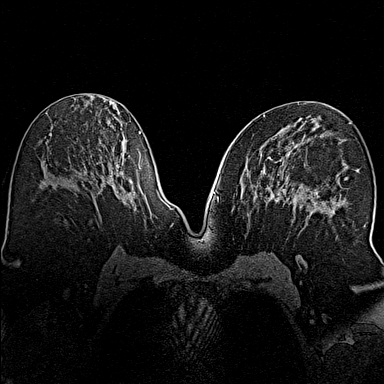
[im 108/144]
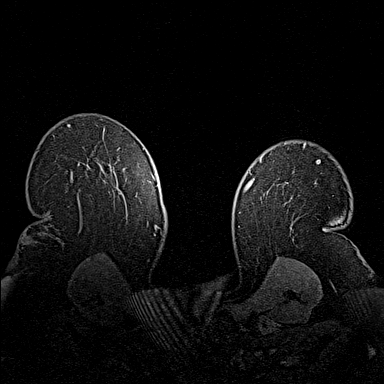
[im 144/144]
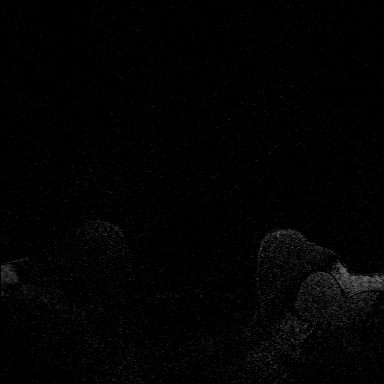

[Series 5: fl3d post-cm 20 · axial · 1.2mm · 0.94mm/px · z∈[-72,+100]mm · 5 of 144 slices shown (1 of 3)]
[im 1/144]
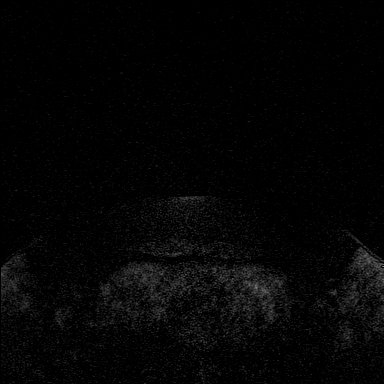
[im 36/144]
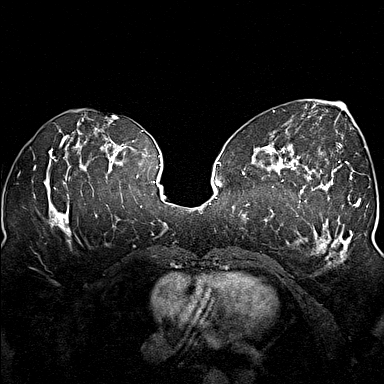
[im 72/144]
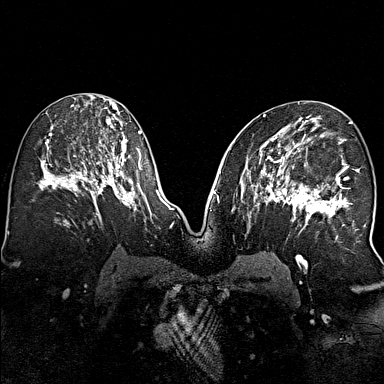
[im 108/144]
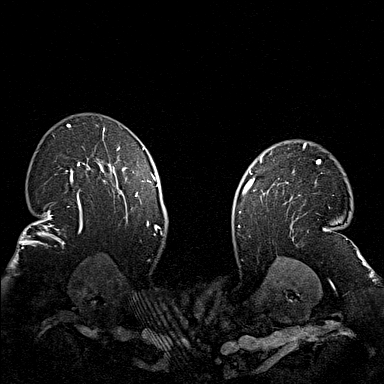
[im 144/144]
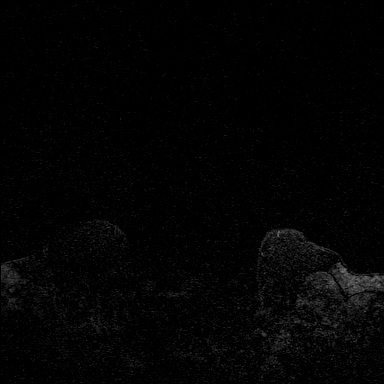

[Series 6: fl3d post-cm 20 · axial · 1.2mm · 0.94mm/px · z∈[-72,+100]mm · 5 of 144 slices shown (2 of 3)]
[im 1/144]
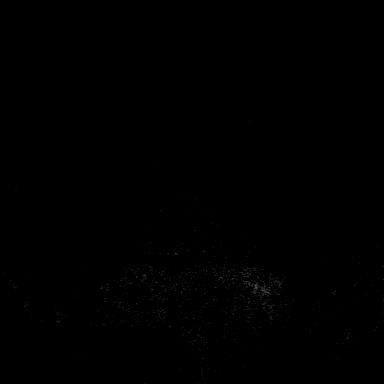
[im 36/144]
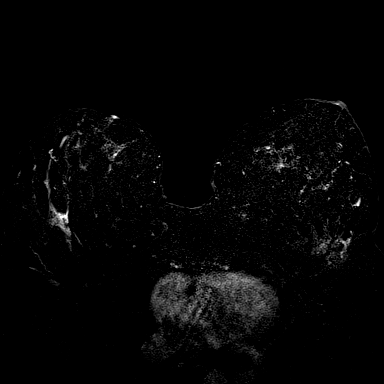
[im 72/144]
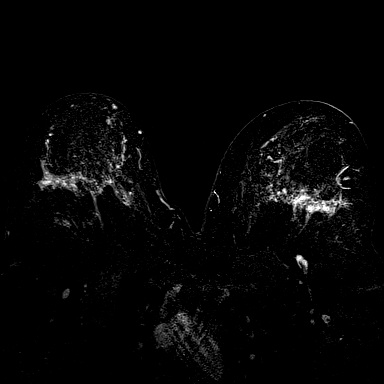
[im 108/144]
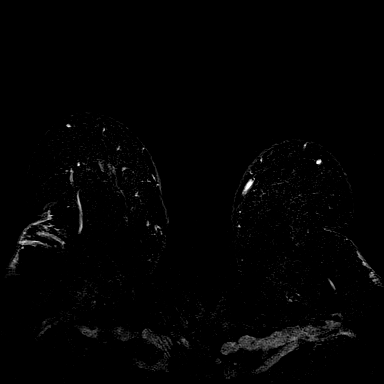
[im 144/144]
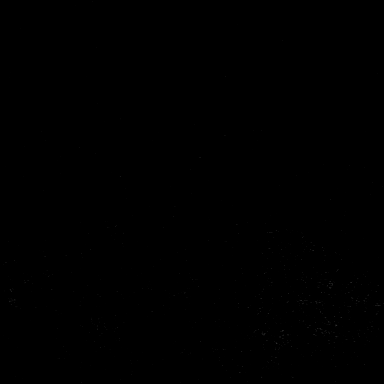

[Series 7: fl3d post-cm 20 · axial · 172.8mm · 0.94mm/px · 1 of 1 slices shown (3 of 3)]
[im 1/1]
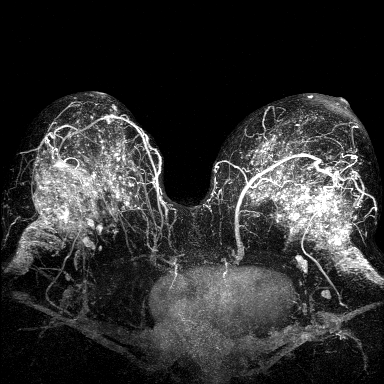

[Series 8: fl3d post-cm 3min · axial · 1.2mm · 0.94mm/px · z∈[-72,+100]mm · 5 of 144 slices shown]
[im 1/144]
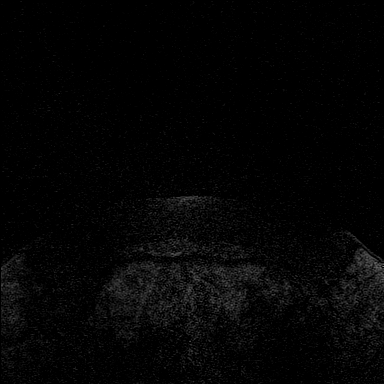
[im 36/144]
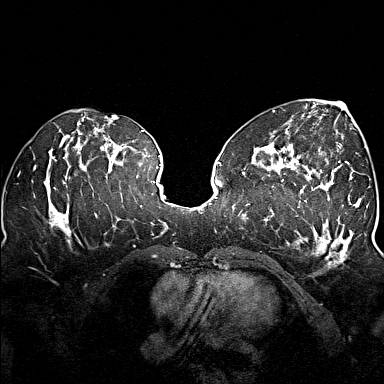
[im 72/144]
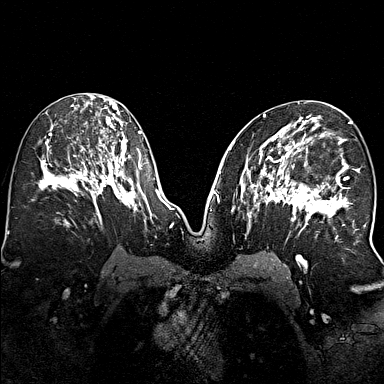
[im 108/144]
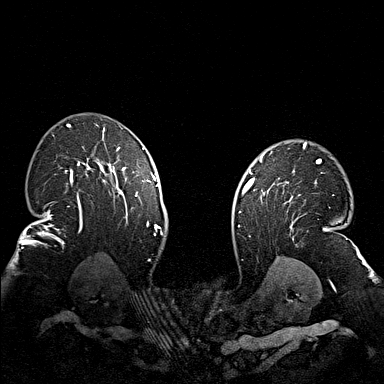
[im 144/144]
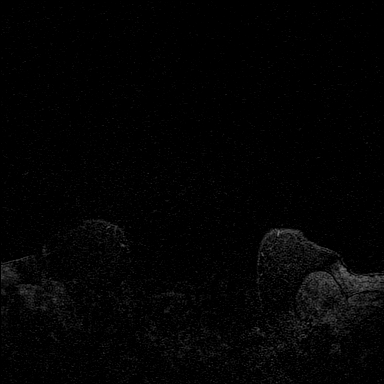

[Series 9: fl3d post-cm 3min_sub · axial · 1.2mm · 0.94mm/px · z∈[-72,+100]mm · 6 of 144 slices shown]
[im 1/144]
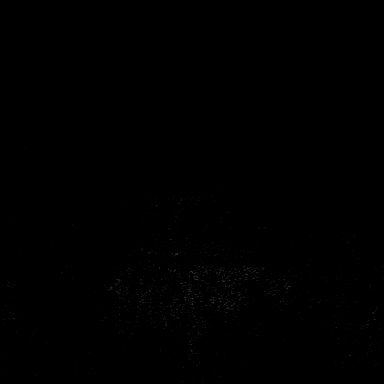
[im 29/144]
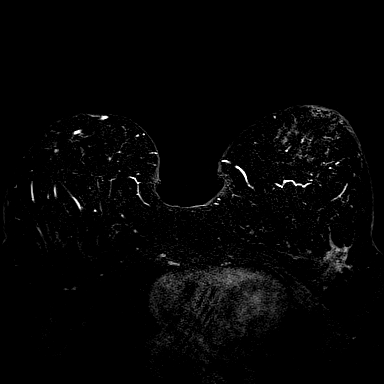
[im 58/144]
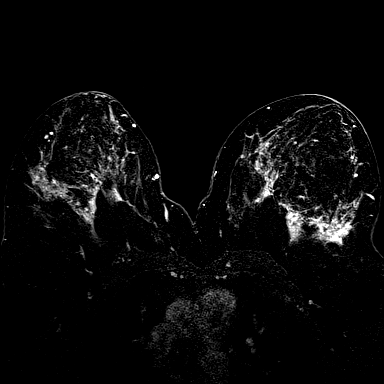
[im 86/144]
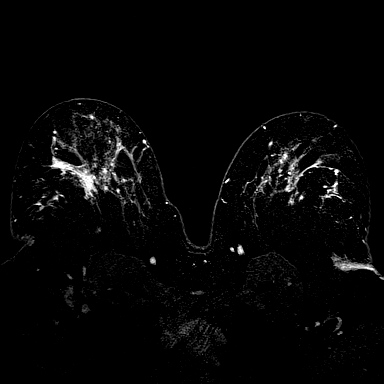
[im 115/144]
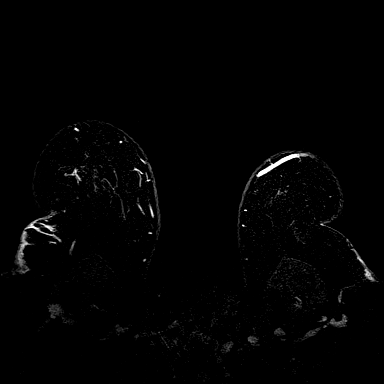
[im 144/144]
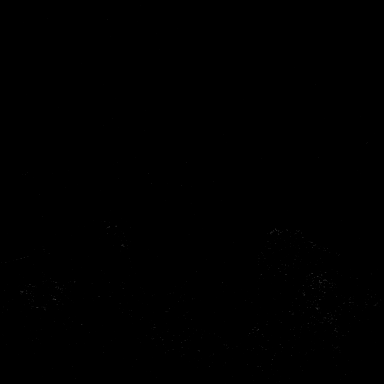

[33 of 48 positions shown; findings below may reference images not displayed]

Three-dimensional MR images were rendered by post-processing of the
original MR data on an independent workstation. The
three-dimensional MR images were interpreted, and findings are
reported in the following complete MRI report for this study. Three
dimensional images were evaluated at the independent [employer]
workstation
FINDINGS: Breast composition: b. Scattered fibroglandular tissue.

Background parenchymal enhancement: Moderate

Right breast: Within the UPPER INNER QUADRANT of the RIGHT breast,
anterior depth, there is irregular mass with persistent type
enhancement kinetics. Margins are irregular. Mass measures 1.2 x
centimeters (image 75 of series 6). Otherwise RIGHT breast is
negative.

Left breast: No mass or abnormal enhancement.

Lymph nodes: No abnormal appearing lymph nodes.

Ancillary findings:  None.
IMPRESSION: 1. Indeterminate 1.2 centimeter enhancing mass in the UPPER INNER
QUADRANT of the RIGHT breast (image 75 of series 6). Given the small
size of this lesion, ultrasound correlate is unlikely.
2. LEFT breast is negative.
3. No adenopathy or other findings.

RECOMMENDATION:
Recommend MR guided core biopsy of mass in the anterior UPPER INNER
QUADRANT of the RIGHT breast.

BI-RADS CATEGORY  4: Suspicious.

## 2020-04-28 MED ORDER — GADOBUTROL 1 MMOL/ML IV SOLN
10.0000 mL | Freq: Once | INTRAVENOUS | Status: AC | PRN
  Administered 2020-04-28: 10 mL via INTRAVENOUS

## 2020-04-29 ENCOUNTER — Other Ambulatory Visit: Payer: Self-pay | Admitting: General Surgery

## 2020-04-29 DIAGNOSIS — R9389 Abnormal findings on diagnostic imaging of other specified body structures: Secondary | ICD-10-CM

## 2020-05-04 ENCOUNTER — Other Ambulatory Visit: Payer: Self-pay

## 2020-05-04 ENCOUNTER — Ambulatory Visit
Admission: RE | Admit: 2020-05-04 | Discharge: 2020-05-04 | Disposition: A | Discharge status: Home / Self Care | Payer: 59 | Source: Ambulatory Visit | Attending: General Surgery | Admitting: General Surgery

## 2020-05-04 ENCOUNTER — Other Ambulatory Visit (HOSPITAL_COMMUNITY): Payer: Self-pay | Admitting: Diagnostic Radiology

## 2020-05-04 ENCOUNTER — Other Ambulatory Visit: Payer: Self-pay | Admitting: Physician Assistant

## 2020-05-04 DIAGNOSIS — G43009 Migraine without aura, not intractable, without status migrainosus: Secondary | ICD-10-CM

## 2020-05-04 DIAGNOSIS — D241 Benign neoplasm of right breast: Secondary | ICD-10-CM | POA: Diagnosis not present

## 2020-05-04 DIAGNOSIS — R9389 Abnormal findings on diagnostic imaging of other specified body structures: Secondary | ICD-10-CM

## 2020-05-04 DIAGNOSIS — N6312 Unspecified lump in the right breast, upper inner quadrant: Secondary | ICD-10-CM | POA: Diagnosis not present

## 2020-05-04 IMAGING — MR MR BREAST BX W/ LOC DEV 1ST LEASION IMAGE BX SPEC MR GUIDE*R*
6 of 8 series · 32 of 48 positions shown · IV contrast (gadavist)
Comparison: Previous exams.
COMPARISON: Previous exams.

Addendum:
CLINICAL DATA: 48-year-old patient with family history breast
cancer recently had a breast MRI. Family history of breast cancer in
patient's mother, maternal grandmother, 3 paternal aunts. An
indeterminate 1.2 cm enhancing mass identified in the anterior third
of the upper inner quadrant of the right breast.

EXAM:
MRI GUIDED CORE NEEDLE BIOPSY OF THE RIGHT BREAST
TECHNIQUE: Multiplanar, multisequence MR imaging of the right breast was
performed both before and after administration of intravenous
contrast.
CONTRAST:  10mL GADAVIST GADOBUTROL 1 MMOL/ML IV SOLN

[Series 2: fiducial unilateral · sagittal · 2.0mm · 1.33mm/px · 1 of 52 slices shown]
[im 1/52]
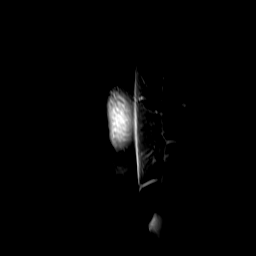

[Series 3: dynamic pre · axial · non-contrast · 1.3mm · 0.73mm/px · z∈[-56,+130]mm · 7 of 144 slices shown]
[im 1/144]
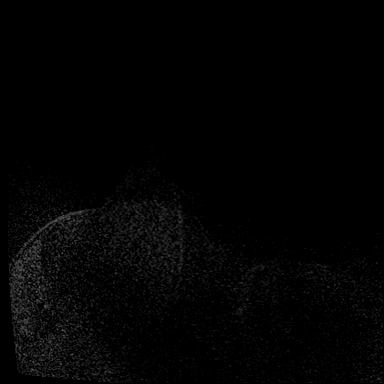
[im 24/144]
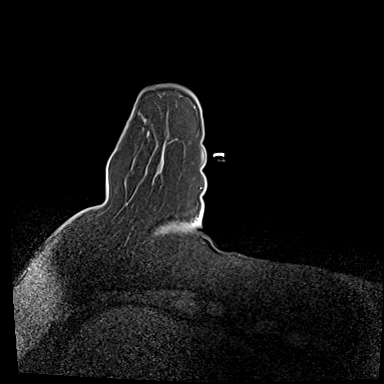
[im 48/144]
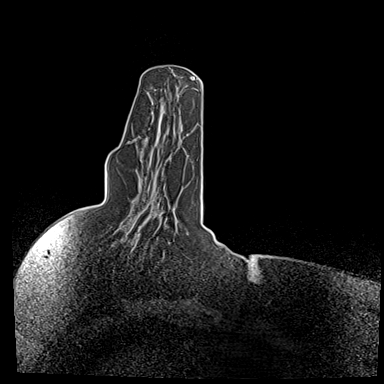
[im 72/144]
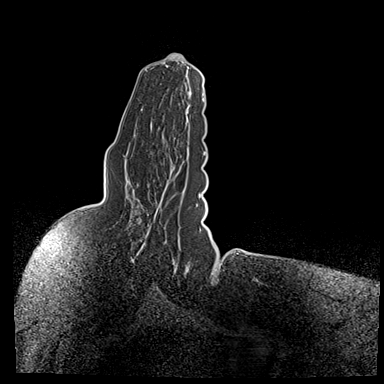
[im 96/144]
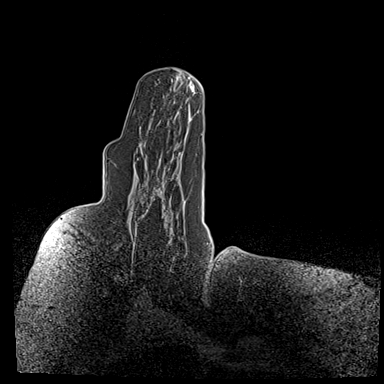
[im 120/144]
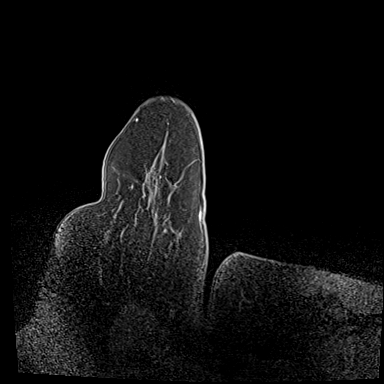
[im 144/144]
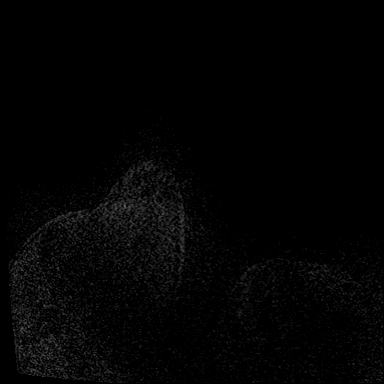

[Series 4: dynamic post 20 · axial · 1.3mm · 0.73mm/px · z∈[-56,+130]mm · 7 of 144 slices shown (1 of 2)]
[im 1/144]
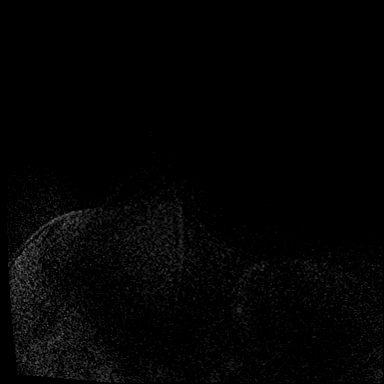
[im 24/144]
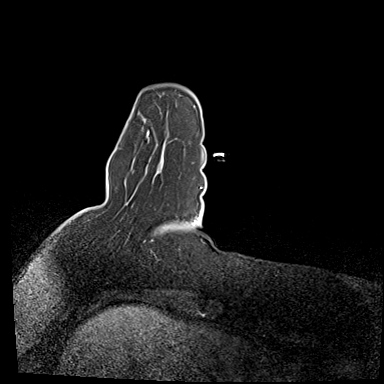
[im 48/144]
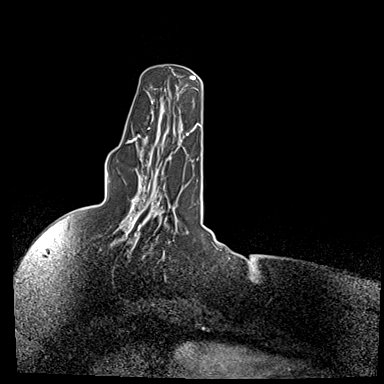
[im 72/144]
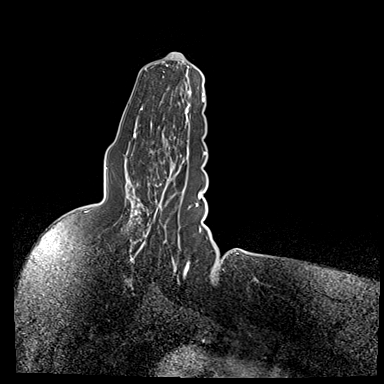
[im 96/144]
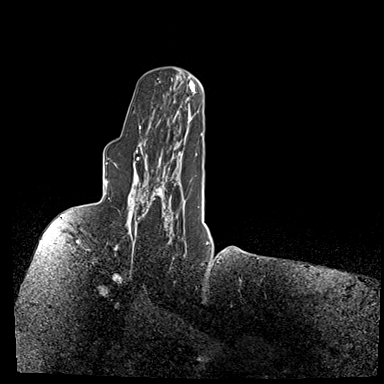
[im 120/144]
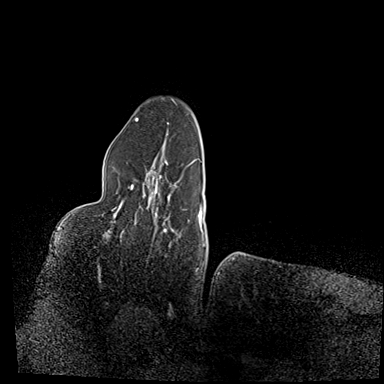
[im 144/144]
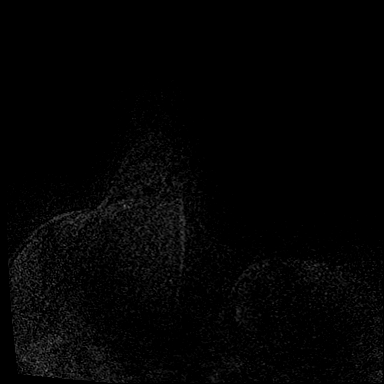

[Series 5: dynamic post 20 · axial · 1.3mm · 0.73mm/px · z∈[-56,+130]mm · 7 of 144 slices shown (2 of 2)]
[im 1/144]
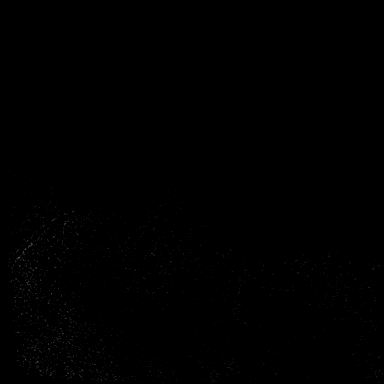
[im 24/144]
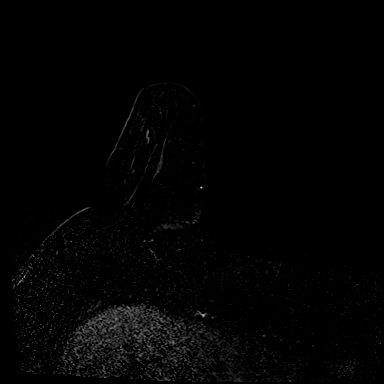
[im 48/144]
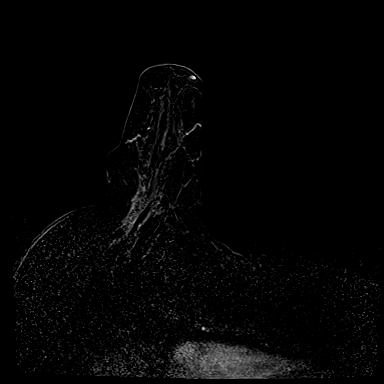
[im 72/144]
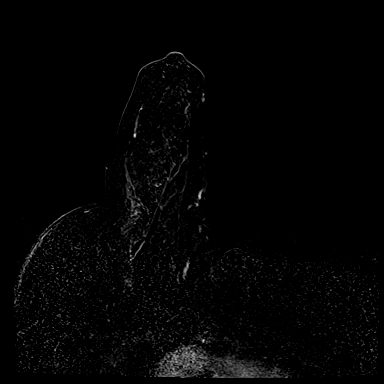
[im 96/144]
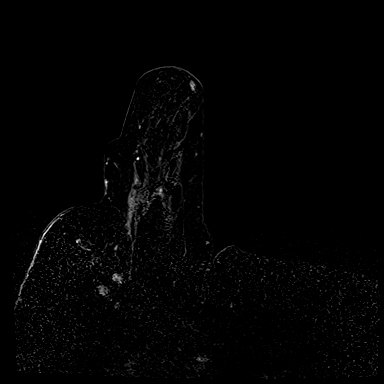
[im 120/144]
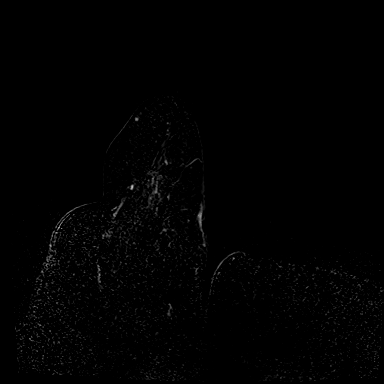
[im 144/144]
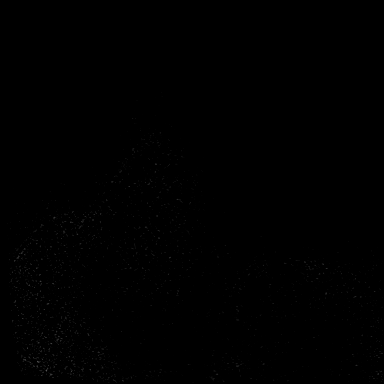

[Series 6: needle confirmation · axial · 1.3mm · 0.73mm/px · z∈[-56,+130]mm · 7 of 144 slices shown]
[im 1/144]
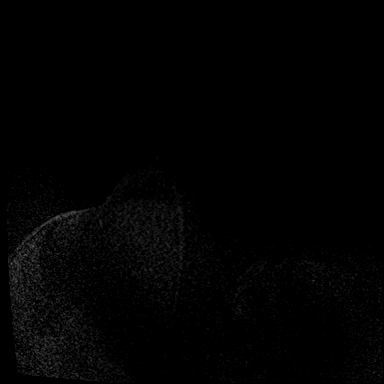
[im 24/144]
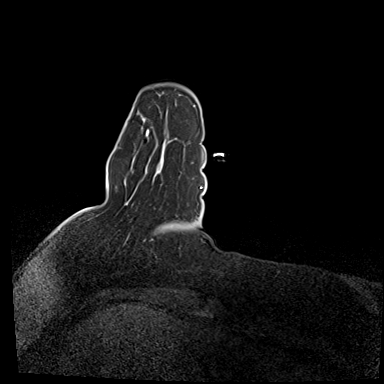
[im 48/144]
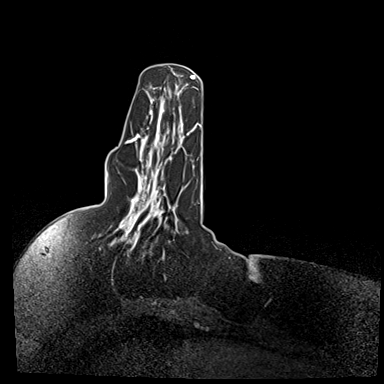
[im 72/144]
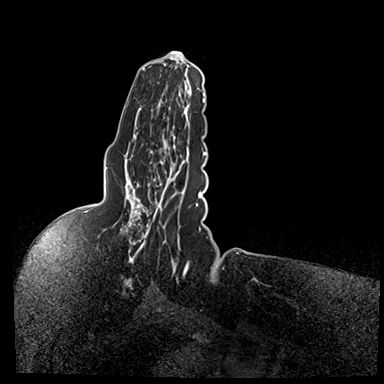
[im 96/144]
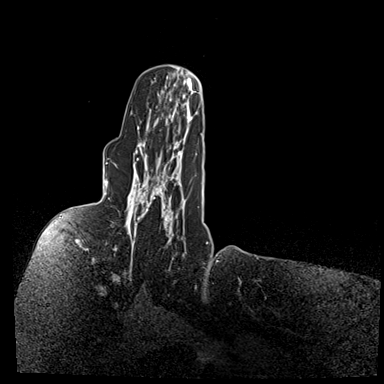
[im 120/144]
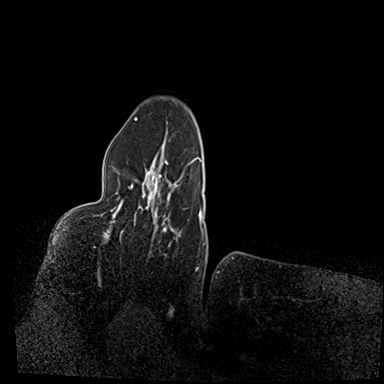
[im 144/144]
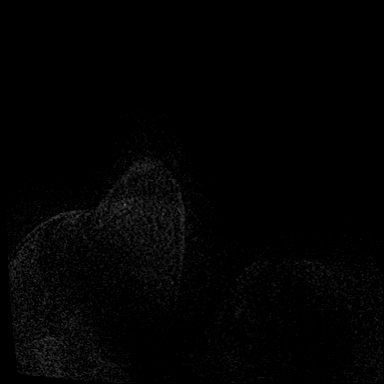

[Series 7: needle confirmation_sub · axial · 1.3mm · 0.73mm/px · z∈[-56,+18]mm · 3 of 141 slices shown]
[im 1/141]
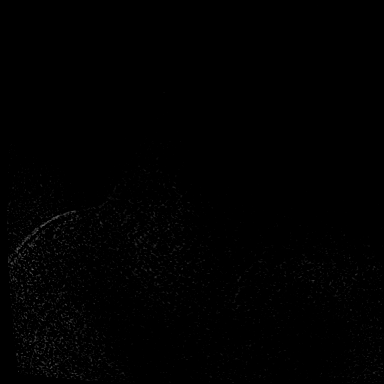
[im 29/141]
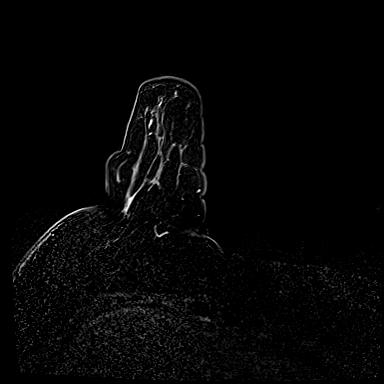
[im 57/141]
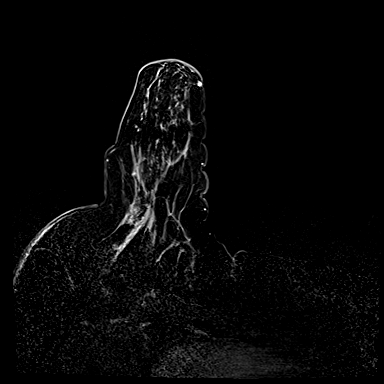

[32 of 48 positions shown; findings below may reference images not displayed]

FINDINGS: I met with the patient, and we discussed the procedure of MRI guided
biopsy, including risks, benefits, and alternatives. Specifically,
we discussed the risks of infection, bleeding, tissue injury, clip
migration, and inadequate sampling. Informed, written consent was
given. The usual time out protocol was performed immediately prior
to the procedure.

Using sterile technique, 1% Lidocaine, MRI guidance, and a 9 gauge
vacuum assisted device, biopsy was performed of a mass in the
anterior third of the upper inner quadrant of the right breast using
a medial approach. At the conclusion of the procedure, a barbell
tissue marker clip was deployed into the biopsy cavity. There is an
approximately 1.0 cm post biopsy hematoma. Follow-up 2-view
mammogram was performed and dictated separately.
IMPRESSION: MRI guided biopsy of the right breast. Approximately 1.0 cm post
biopsy hematoma at the biopsy site in the right breast.

ADDENDUM:
Pathology revealed DUCTAL PAPILLOMA of the Right breast, upper
inner, anterior third, (barbell clip). This was found to be
concordant by Dr. MOUZO, with excision recommended.

Pathology results were discussed with the patient by telephone. The
patient reported doing well after the biopsy with tenderness at the
site. Post biopsy instructions and care were reviewed and questions
were answered. The patient was encouraged to call The [REDACTED] for any additional concerns. My direct phone
number was provided.

Surgical consultation has been arranged with Dr. MOUZO
at [REDACTED] on [DATE].

Pathology results reported by MOUZO, RN on [DATE].

*** End of Addendum ***
FINDINGS: I met with the patient, and we discussed the procedure of MRI guided
biopsy, including risks, benefits, and alternatives. Specifically,
we discussed the risks of infection, bleeding, tissue injury, clip
migration, and inadequate sampling. Informed, written consent was
given. The usual time out protocol was performed immediately prior
to the procedure.

Using sterile technique, 1% Lidocaine, MRI guidance, and a 9 gauge
vacuum assisted device, biopsy was performed of a mass in the
anterior third of the upper inner quadrant of the right breast using
a medial approach. At the conclusion of the procedure, a barbell
tissue marker clip was deployed into the biopsy cavity. There is an
approximately 1.0 cm post biopsy hematoma. Follow-up 2-view
mammogram was performed and dictated separately.
IMPRESSION: MRI guided biopsy of the right breast. Approximately 1.0 cm post
biopsy hematoma at the biopsy site in the right breast.

## 2020-05-04 IMAGING — MG MM BREAST LOCALIZATION CLIP
4 series · 4 of 12 positions shown · non-contrast
Comparison: Previous exam(s).

CLINICAL DATA: MRI guided core needle biopsy was performed of a
cm mass in the anterior third of the upper inner right breast.

EXAM:
DIAGNOSTIC RIGHT MAMMOGRAM POST MRI BIOPSY

[R ML synth-2D]
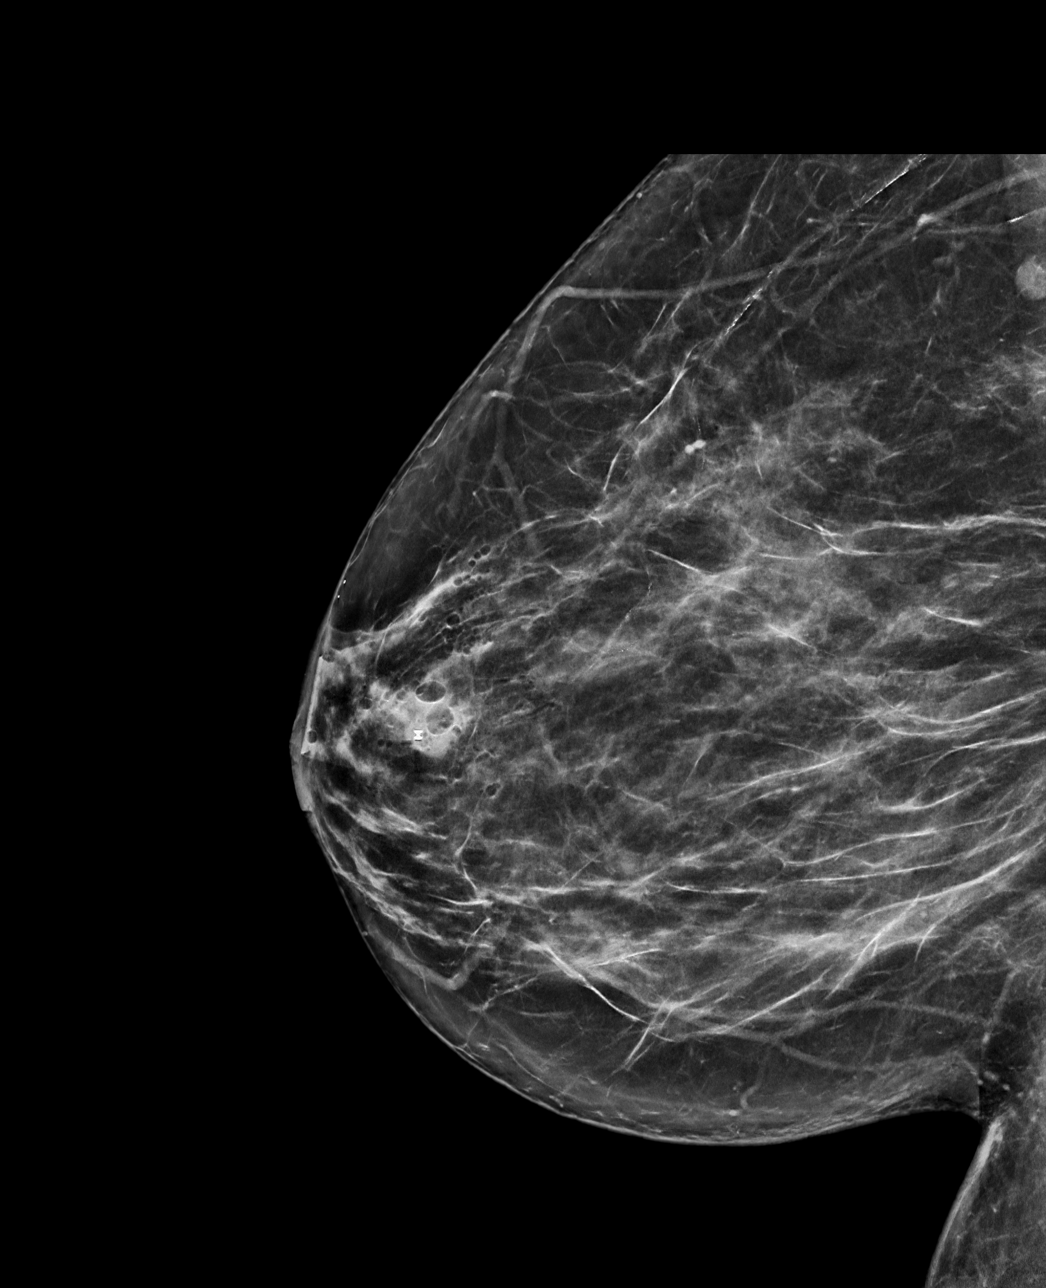

[R CC synth-2D]
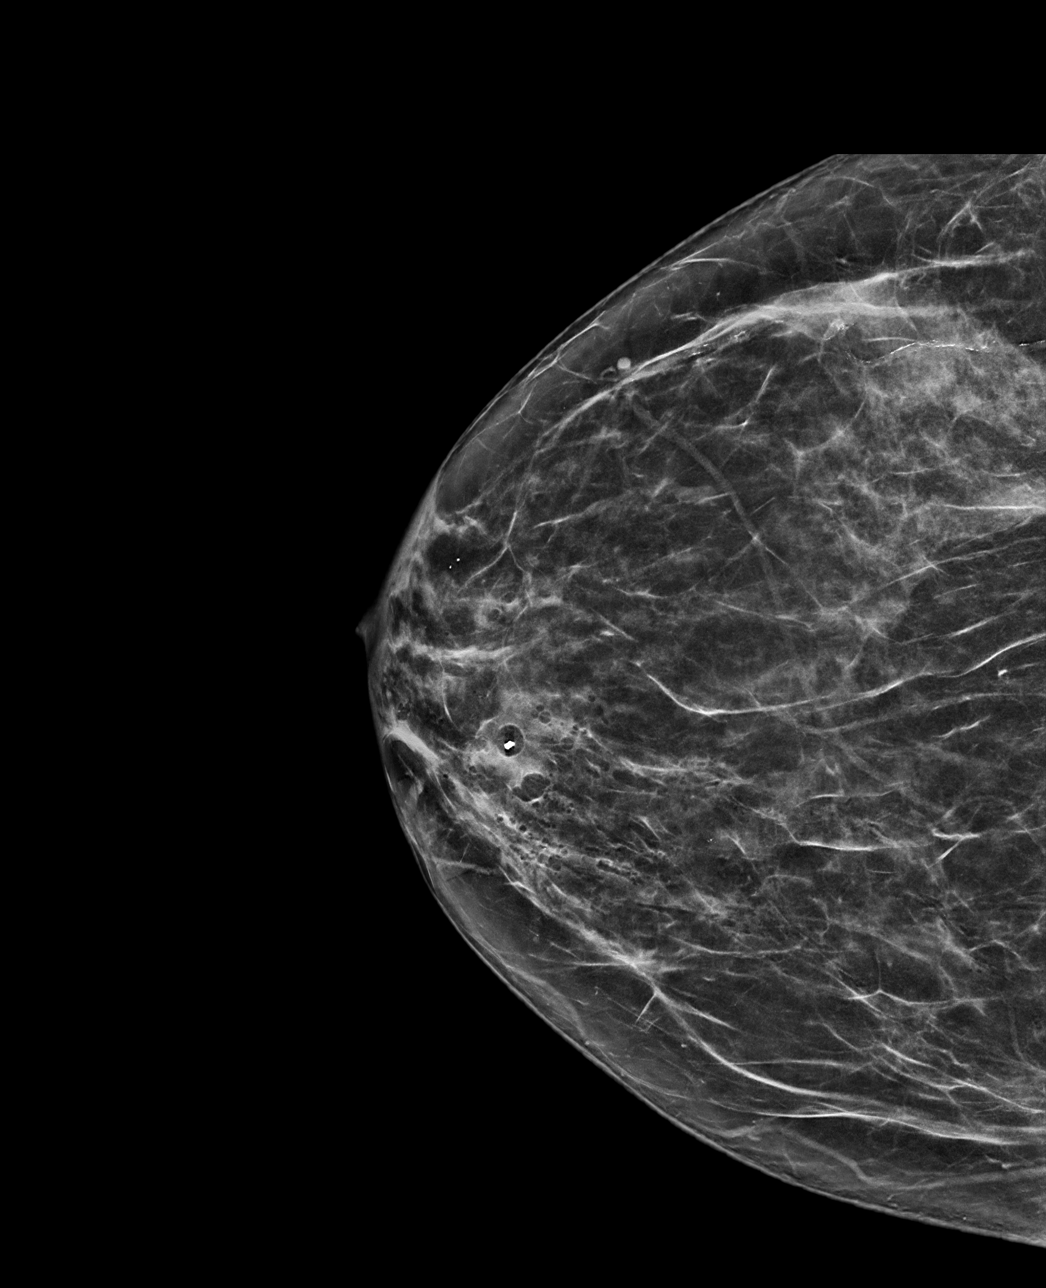

[R CC tomo · tomo slice 38/75.0]
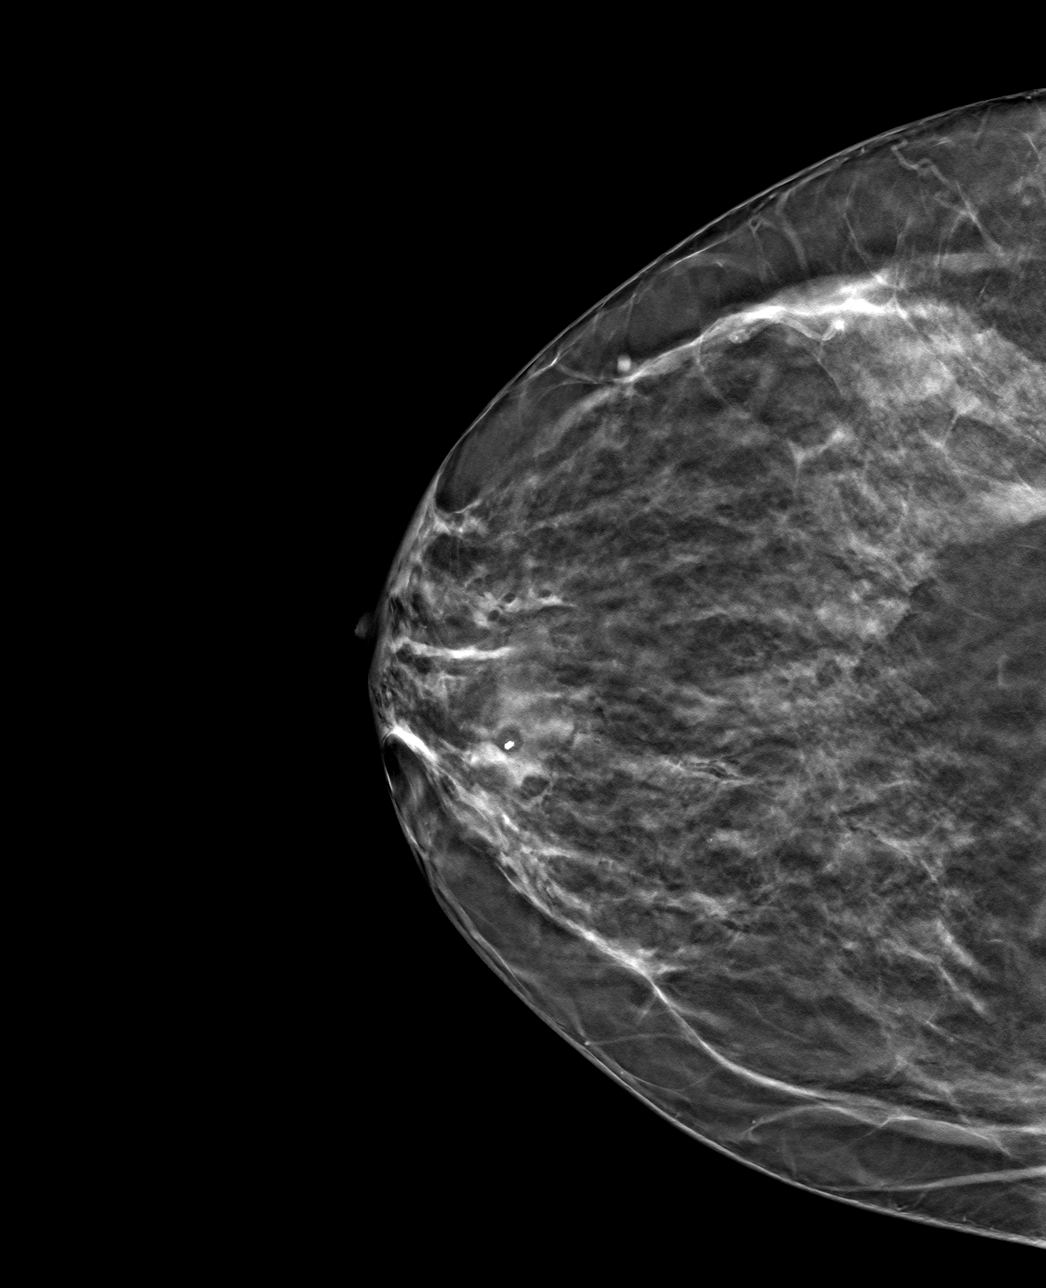

[R ML tomo · tomo slice 41/82.0]
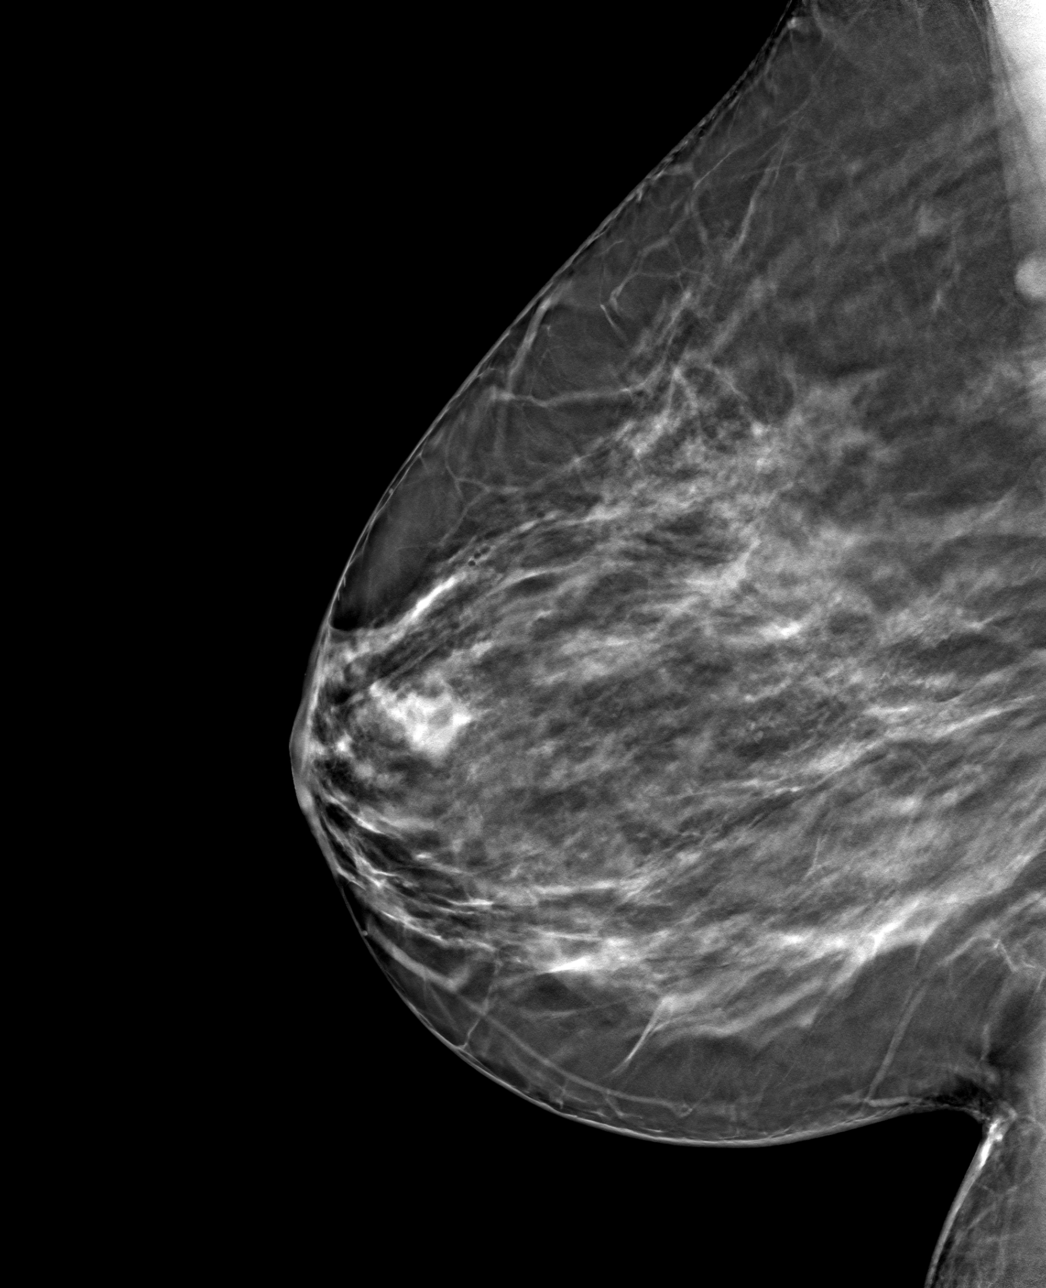

[4 of 12 positions shown; findings below may reference images not displayed]

FINDINGS: Mammographic images were obtained following MRI guided biopsy of the
right breast. The barbell biopsy marking clip is in expected
position at the site of biopsy, in the upper inner quadrant of the
anterior third right breast.
IMPRESSION: Appropriate positioning of the barbell shaped biopsy marking clip at
the site of biopsy in the anterior upper inner quadrant.

Final Assessment: Post Procedure Mammograms for Marker Placement

## 2020-05-04 MED ORDER — GADOBUTROL 1 MMOL/ML IV SOLN
10.0000 mL | Freq: Once | INTRAVENOUS | Status: AC | PRN
  Administered 2020-05-04: 10 mL via INTRAVENOUS

## 2020-05-09 DIAGNOSIS — N83201 Unspecified ovarian cyst, right side: Secondary | ICD-10-CM | POA: Diagnosis not present

## 2020-05-16 ENCOUNTER — Other Ambulatory Visit: Payer: Self-pay | Admitting: General Surgery

## 2020-05-16 DIAGNOSIS — D241 Benign neoplasm of right breast: Secondary | ICD-10-CM

## 2020-05-16 DIAGNOSIS — G4733 Obstructive sleep apnea (adult) (pediatric): Secondary | ICD-10-CM | POA: Diagnosis not present

## 2020-05-19 ENCOUNTER — Other Ambulatory Visit: Payer: Self-pay | Admitting: General Surgery

## 2020-05-19 DIAGNOSIS — D241 Benign neoplasm of right breast: Secondary | ICD-10-CM

## 2020-05-31 ENCOUNTER — Encounter: Payer: Self-pay | Admitting: *Deleted

## 2020-05-31 DIAGNOSIS — I10 Essential (primary) hypertension: Secondary | ICD-10-CM | POA: Diagnosis not present

## 2020-05-31 DIAGNOSIS — Z6838 Body mass index (BMI) 38.0-38.9, adult: Secondary | ICD-10-CM | POA: Diagnosis not present

## 2020-05-31 DIAGNOSIS — Z9989 Dependence on other enabling machines and devices: Secondary | ICD-10-CM | POA: Diagnosis not present

## 2020-05-31 DIAGNOSIS — G4733 Obstructive sleep apnea (adult) (pediatric): Secondary | ICD-10-CM | POA: Diagnosis not present

## 2020-05-31 DIAGNOSIS — F5101 Primary insomnia: Secondary | ICD-10-CM | POA: Diagnosis not present

## 2020-06-03 ENCOUNTER — Encounter: Payer: Self-pay | Admitting: Physician Assistant

## 2020-06-03 ENCOUNTER — Ambulatory Visit: Payer: 59 | Admitting: Physician Assistant

## 2020-06-03 ENCOUNTER — Other Ambulatory Visit: Payer: Self-pay

## 2020-06-03 VITALS — BP 109/73 | HR 103 | Wt 220.4 lb

## 2020-06-03 DIAGNOSIS — G43009 Migraine without aura, not intractable, without status migrainosus: Secondary | ICD-10-CM | POA: Diagnosis not present

## 2020-06-03 DIAGNOSIS — M62838 Other muscle spasm: Secondary | ICD-10-CM | POA: Diagnosis not present

## 2020-06-03 NOTE — Patient Instructions (Signed)

## 2020-06-03 NOTE — Progress Notes (Signed)
History:  Julie Hansen is a 49 y.o. [id number] who presents to clinic today for headache eval.  Julie Hansen has been effective for acute migraine, working within 30-40 minutes.  There have been days that she had to miss work due to the migraine when ubrelvy, ibuprofen and baclofen all did not work.  Phenergan helped the nausea and headache.  She is able to work with use of phenergan.  She hasn't really noticed much difference in her headaches since using ajovy.  She used 3 injections all at once but didn't see much difference even in the beginning.  She is due to use it again soon and just got her next set of 3 injections but has not yet given them to herself.   She is working as a Retail buyer at [employer] and very stressed with all that is expected at this time with [place]. She has recently learned of a mass in her breast that may become problematic.  She is planning to have surgery for this but is in the beginning stages.      Past Medical History:  Diagnosis Date  . Acid reflux   . Asthma   . Diabetes mellitus without complication ([place])   . Hyperlipidemia   . PCOS (polycystic ovarian syndrome)   . PONV (postoperative nausea and vomiting)     Social History   Socioeconomic History  . Marital status: Married    Spouse name: Not on file  . Number of children: Not on file  . Years of education: Not on file  . Highest education level: Not on file  Occupational History  . Not on file  Tobacco Use  . Smoking status: Never Smoker  . Smokeless tobacco: Never Used  Vaping Use  . Vaping Use: Never used  Substance and Sexual Activity  . Alcohol use: Yes    Alcohol/week: 6.0 standard drinks    Types: 6 Standard drinks or equivalent per week    Comment: 1-2 every 2-3 weeks  . Drug use: No  . Sexual activity: Yes    Birth control/protection: None  Other Topics Concern  . Not on file  Social History Narrative  . Not on file   Social Determinants of Health   Financial Resource  Strain:   . Difficulty of Paying Living Expenses: Not on file  Food Insecurity:   . Worried About Charity fundraiser in the Last Year: Not on file  . Ran Out of Food in the Last Year: Not on file  Transportation Needs:   . Lack of Transportation (Medical): Not on file  . Lack of Transportation (Non-Medical): Not on file  Physical Activity:   . Days of Exercise per Week: Not on file  . Minutes of Exercise per Session: Not on file  Stress:   . Feeling of Stress : Not on file  Social Connections:   . Frequency of Communication with Friends and Family: Not on file  . Frequency of Social Gatherings with Friends and Family: Not on file  . Attends Religious Services: Not on file  . Active Member of Clubs or Organizations: Not on file  . Attends Archivist Meetings: Not on file  . Marital Status: Not on file  Intimate Partner Violence:   . Fear of Current or Ex-Partner: Not on file  . Emotionally Abused: Not on file  . Physically Abused: Not on file  . Sexually Abused: Not on file    Family History  Problem Relation Age of Onset  .  Cancer Mother   . Hypertension Mother   . Diabetes Mother   . Breast cancer Mother 43  . Cancer Maternal Grandmother   . Breast cancer Maternal Grandmother 46  . Cancer Father   . Diabetes Father   . Hypertension Father   . Breast cancer Paternal Aunt 8  . Breast cancer Paternal Aunt 63  . Breast cancer Paternal Aunt 46    Allergies  Allergen Reactions  . Hydrocodone-Homatropine Itching  . Macrodantin [Nitrofurantoin Macrocrystal] Itching  . Carbamazepine Other (See Comments)    Vaginal discharge    Current Outpatient Medications on File Prior to Visit  Medication Sig Dispense Refill  . albuterol (PROVENTIL) (2.5 MG/3ML) 0.083% nebulizer solution Take 2.5 mg by nebulization every 6 (six) hours as needed.    . ALPRAZolam (XANAX) 1 MG tablet Take 0.5 mg by mouth 2 (two) times daily as needed for anxiety.     Marland Kitchen atorvastatin (LIPITOR)  10 MG tablet Take 10 mg by mouth daily.    . baclofen (LIORESAL) 10 MG tablet Take 1 tablet (10 mg total) by mouth daily as needed. 60 tablet 1  . beclomethasone (QVAR) 40 MCG/ACT inhaler Inhale 2 puffs into the lungs 2 (two) times daily. prn    . Fremanezumab-vfrm (AJOVY) 225 MG/1.5ML SOAJ Inject 675 mg into the skin every 3 (three) months. 3 pen 3  . ibuprofen (ADVIL,MOTRIN) 800 MG tablet Take 1 tablet (800 mg total) by mouth every 8 (eight) hours as needed. 60 tablet 3  . lisinopril (PRINIVIL,ZESTRIL) 5 MG tablet Take 5 mg by mouth daily.    . promethazine (PHENERGAN) 25 MG tablet TAKE 1 TABLET BY MOUTH EVERY 6 HOURS AS NEEDED FOR NAUSEA OR VOMITING. 30 tablet 1  . Ubrogepant (UBRELVY) 100 MG TABS Take 100 mg by mouth as needed. 10 tablet 3  . Vitamin D, Ergocalciferol, (DRISDOL) 1.25 MG (50000 UT) CAPS capsule     . zolpidem (AMBIEN) 10 MG tablet Take 10 mg by mouth at bedtime as needed. Reported on 10/17/2015    . glimepiride (AMARYL) 4 MG tablet Take 2 mg by mouth 2 (two) times daily.  (Patient not taking: Reported on 06/03/2020)     No current facility-administered medications on file prior to visit.     Review of Systems:  All pertinent positive/negative included in HPI, all other review of systems are negative   Objective:  Physical Exam BP 109/73   Pulse (!) 103   Wt 220 lb 6.4 oz (100 kg)   LMP 05/26/2020   BMI 37.83 kg/m  CONSTITUTIONAL: Well-developed, well-nourished female in no acute distress.  EYES: EOM intact ENT: Normocephalic CARDIOVASCULAR: Regular rate and rhythm with no adventitious sounds.  RESPIRATORY: Normal rate.  MUSCULOSKELETAL: Normal ROM SKIN: Warm, dry without erythema  NEUROLOGICAL: Alert, oriented, CN II-XII grossly intact, Appropriate balance  PSYCH: Normal behavior, mood   Assessment & Plan:  Assessment: 1. Migraine without aura and without status migrainosus, not intractable   2. Muscle spasm   insufficient improvement   Plan: Add  metoprolol 25mg  daily for migraine prevention Use baclofen liberally to reduce muscle tension - evenings and as tolerated during the day Continue ubrelvy for acute migraine Phenergan prn migraine/nausea Ajovy x 3 injections again - due soon.  If this does not result in signifcant, improvement, will d/c.   Follow-up in 3 months or sooner PRN  Paticia Stack, PA-C 06/03/2020 8:10 AM

## 2020-06-23 ENCOUNTER — Encounter (HOSPITAL_BASED_OUTPATIENT_CLINIC_OR_DEPARTMENT_OTHER)
Admission: RE | Admit: 2020-06-23 | Discharge: 2020-06-23 | Disposition: A | Discharge status: Home / Self Care | Payer: 59 | Source: Ambulatory Visit | Attending: General Surgery | Admitting: General Surgery

## 2020-06-23 ENCOUNTER — Other Ambulatory Visit: Payer: Self-pay

## 2020-06-23 ENCOUNTER — Encounter (HOSPITAL_BASED_OUTPATIENT_CLINIC_OR_DEPARTMENT_OTHER): Payer: Self-pay | Admitting: General Surgery

## 2020-06-23 DIAGNOSIS — Z01812 Encounter for preprocedural laboratory examination: Secondary | ICD-10-CM | POA: Diagnosis not present

## 2020-06-23 LAB — BASIC METABOLIC PANEL
Anion gap: 8 (ref 5–15)
BUN: 11 mg/dL (ref 6–20)
CO2: 26 mmol/L (ref 22–32)
Calcium: 9.2 mg/dL (ref 8.9–10.3)
Chloride: 103 mmol/L (ref 98–111)
Creatinine, Ser: 0.87 mg/dL (ref 0.44–1.00)
GFR calc Af Amer: >60 mL/min (ref >60)
GFR calc non Af Amer: >60 mL/min (ref >60)
Glucose, Bld: 159 mg/dL — ABNORMAL HIGH (ref 70–99)
Potassium: 3.8 mmol/L (ref 3.5–5.1)
Sodium: 137 mmol/L (ref 135–145)

## 2020-06-23 LAB — POCT PREGNANCY, URINE: Preg Test, Ur: NEGATIVE

## 2020-06-23 NOTE — Progress Notes (Signed)
      Enhanced Recovery after Surgery  Enhanced Recovery after Surgery is a protocol used to improve the stress on your body and your recovery after surgery.  Patient Instructions  . The night before surgery:  o No food after midnight. ONLY clear liquids after midnight  . The day of surgery (if you do NOT have diabetes):  o Drink ONE (1) Pre-Surgery Clear Ensure as directed.   o This drink was given to you during your hospital  pre-op appointment visit. o The pre-op nurse will instruct you on the time to drink the  Pre-Surgery Ensure depending on your surgery time. o Finish the drink at the designated time by the pre-op nurse.  o Nothing else to drink after completing the  Pre-Surgery Clear Ensure.  . The day of surgery (if you have diabetes): o Drink ONE (1) Gatorade 2 (G2) as directed. o This drink was given to you during your hospital  pre-op appointment visit.  o The pre-op nurse will instruct you on the time to drink the   Gatorade 2 (G2) depending on your surgery time. o Color of the Gatorade may vary. Red is not allowed. o Nothing else to drink after completing the  Gatorade 2 (G2).         If you have questions, please contact your surgeon's office.

## 2020-06-24 ENCOUNTER — Other Ambulatory Visit (HOSPITAL_COMMUNITY): Payer: 59

## 2020-06-24 ENCOUNTER — Other Ambulatory Visit (HOSPITAL_COMMUNITY)
Admission: RE | Admit: 2020-06-24 | Discharge: 2020-06-24 | Disposition: A | Discharge status: Home / Self Care | Payer: 59 | Source: Ambulatory Visit | Attending: General Surgery | Admitting: General Surgery

## 2020-06-24 DIAGNOSIS — Z01812 Encounter for preprocedural laboratory examination: Secondary | ICD-10-CM | POA: Diagnosis not present

## 2020-06-24 DIAGNOSIS — Z20822 Contact with and (suspected) exposure to covid-19: Secondary | ICD-10-CM | POA: Diagnosis not present

## 2020-06-24 LAB — SARS CORONAVIRUS 2 (TAT 6-24 HRS): SARS Coronavirus 2: NEGATIVE

## 2020-06-27 ENCOUNTER — Other Ambulatory Visit: Payer: Self-pay

## 2020-06-27 ENCOUNTER — Ambulatory Visit
Admission: RE | Admit: 2020-06-27 | Discharge: 2020-06-27 | Disposition: A | Discharge status: Home / Self Care | Payer: 59 | Source: Ambulatory Visit | Attending: General Surgery | Admitting: General Surgery

## 2020-06-27 DIAGNOSIS — D241 Benign neoplasm of right breast: Secondary | ICD-10-CM | POA: Diagnosis not present

## 2020-06-27 IMAGING — MG MM PLC BREAST LOC DEV 1ST LESION INC*R*
8 of 9 series · 8 of 9 positions shown · non-contrast
Comparison: Previous exam(s).

CLINICAL DATA: HISTORY OF RIGHT BREAST PAPILLOMA SCHEDULED FOR
SURGICAL EXCISION.

EXAM:
MAMMOGRAPHIC GUIDED RADIOACTIVE SEED LOCALIZATION OF THE RIGHT
BREAST

[R CC (1 of 5)]
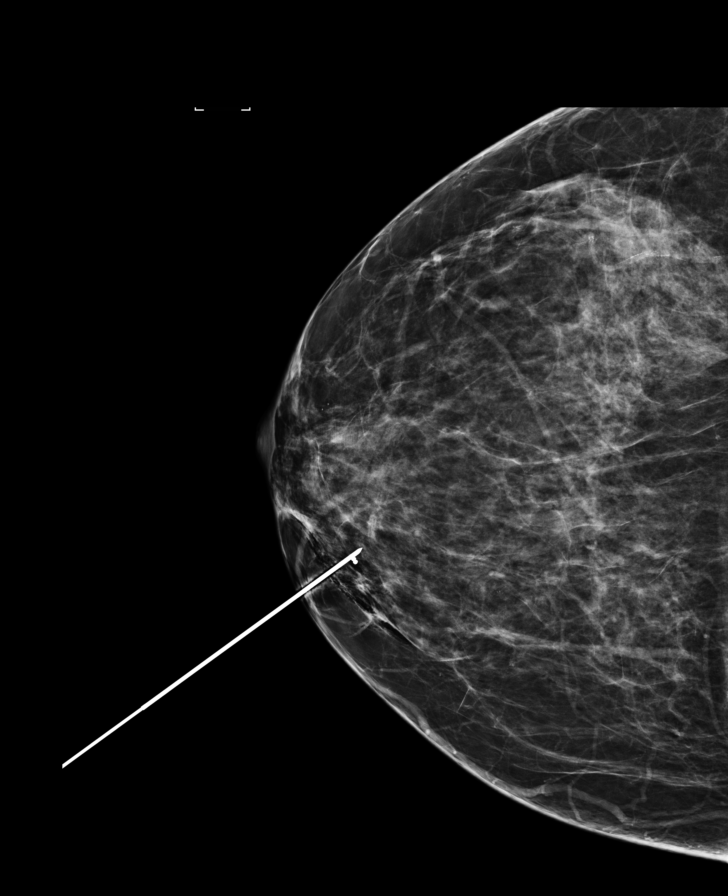

[R CC (2 of 5)]
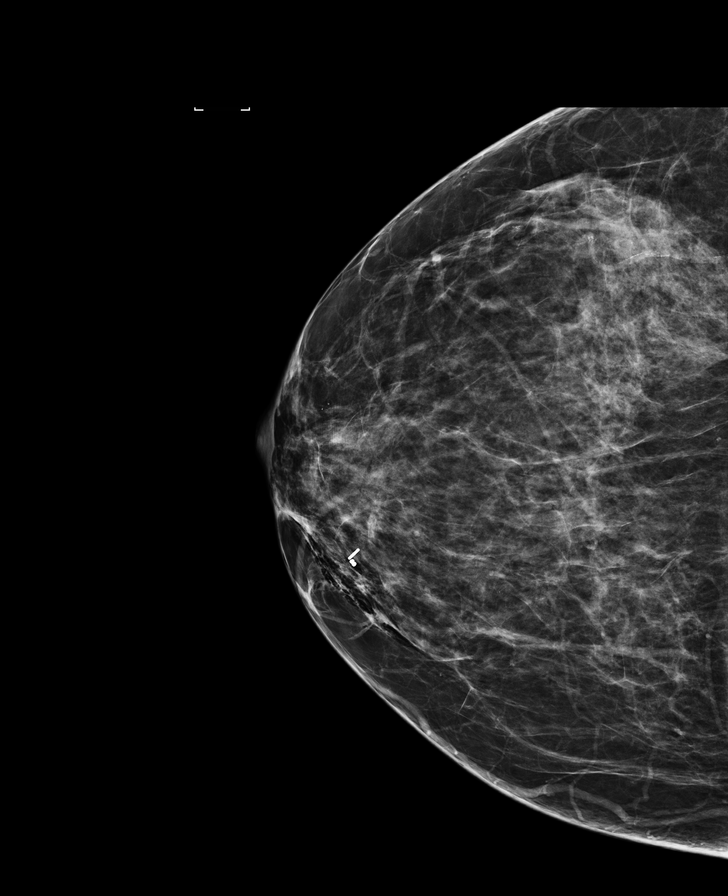

[R ML (1 of 3)]
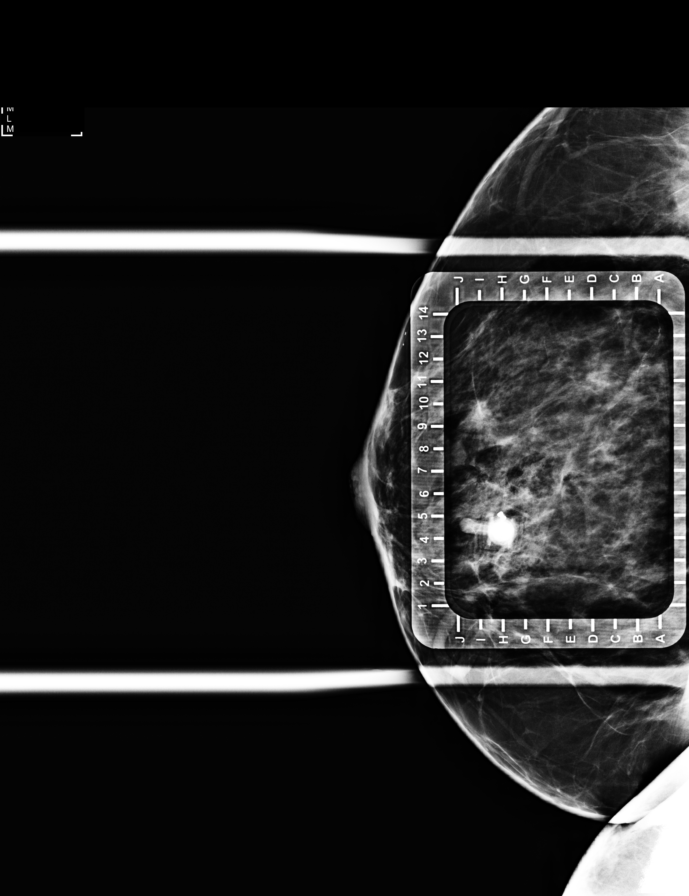

[R ML (2 of 3)]
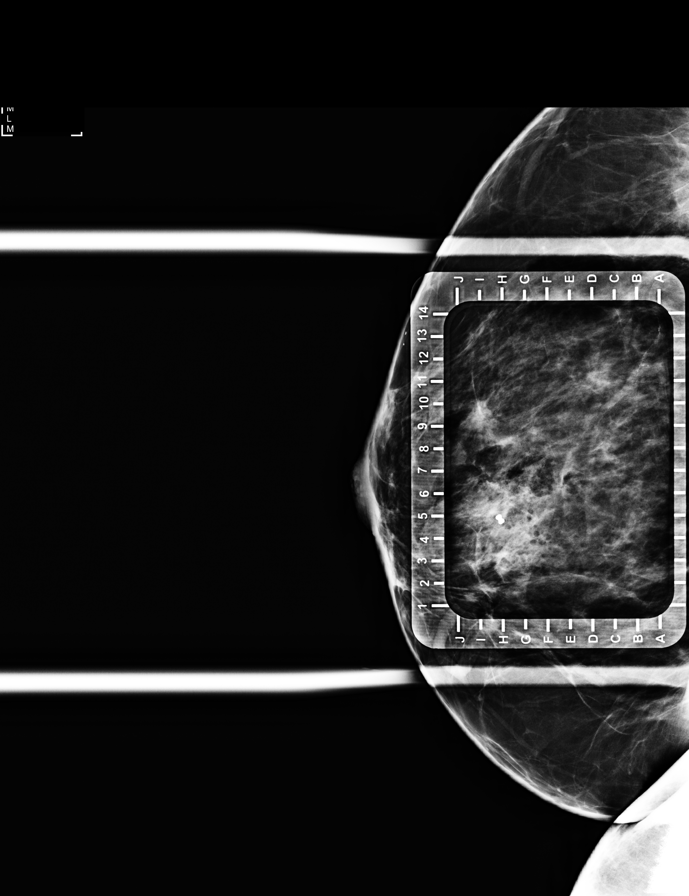

[R ML (3 of 3)]
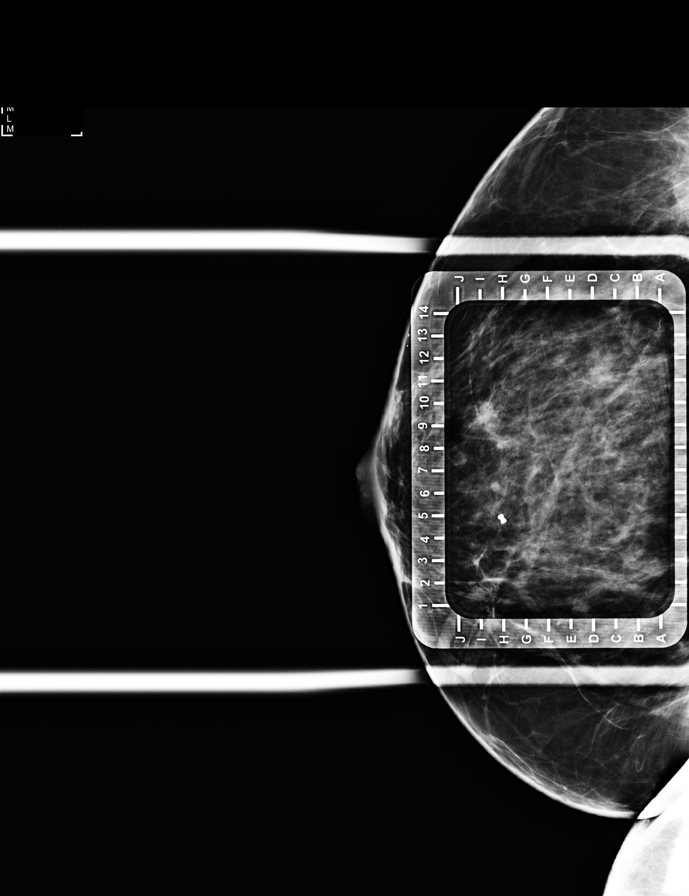

[R CC (3 of 5)]
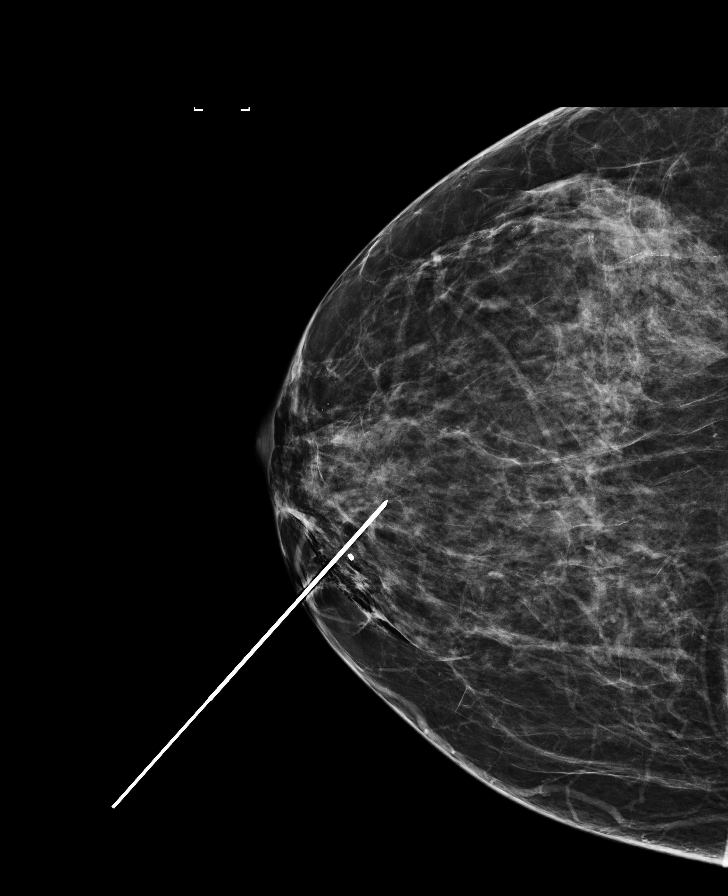

[R CC (4 of 5)]
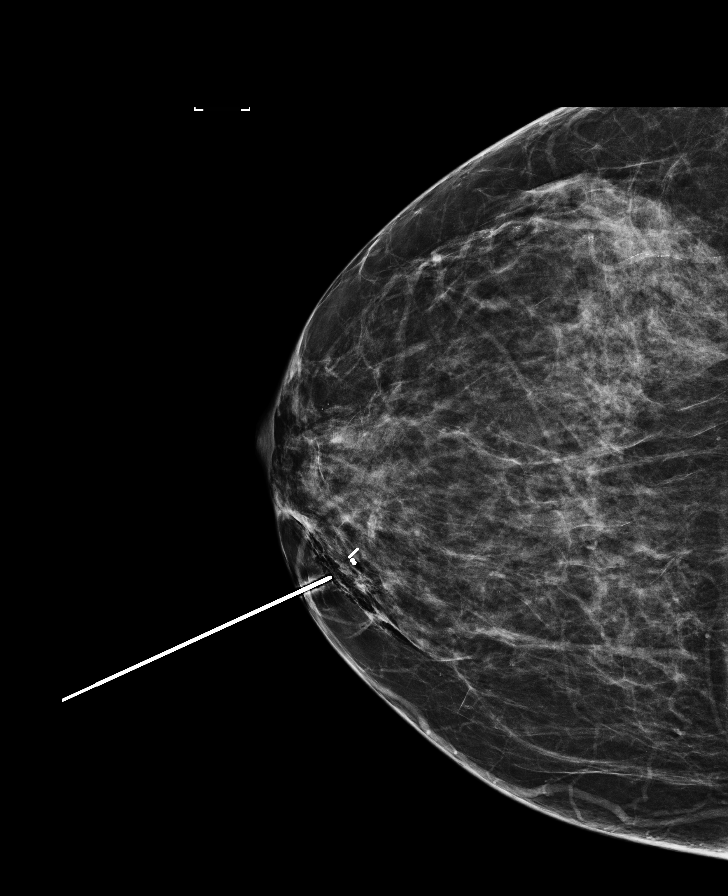

[R CC (5 of 5)]
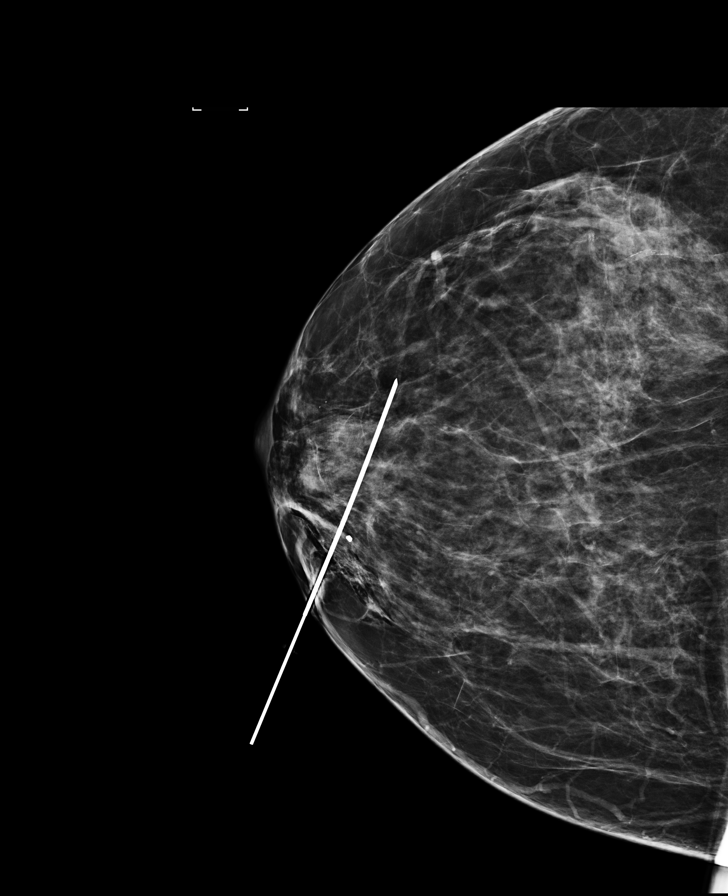

[8 of 9 positions shown; findings below may reference images not displayed]

FINDINGS: Patient presents for radioactive seed localization prior to surgical
excision. I met with the patient and we discussed the procedure of
seed localization including benefits and alternatives. We discussed
the high likelihood of a successful procedure. We discussed the
risks of the procedure including infection, bleeding, tissue injury
and further surgery. We discussed the low dose of radioactivity
involved in the procedure. Informed, written consent was given.

The usual time-out protocol was performed immediately prior to the
procedure.

Using mammographic guidance, sterile technique, 1% lidocaine and an
[HQ] radioactive seed, the barbell shaped clip within the inner
RIGHT breast was localized using a medial approach. The follow-up
mammogram images confirm the seed in the expected location and were
marked for Dr. ZADI.

Follow-up survey of the patient confirms presence of the radioactive
seed.

Order number of [HQ] seed:  [PHONE_NUMBER].

Total activity:  0.258 millicurie reference Date: [DATE]

The patient tolerated the procedure well and was released from the
[REDACTED]. She was given instructions regarding seed removal.
IMPRESSION: Radioactive seed localization right breast. No apparent
complications.

## 2020-06-28 ENCOUNTER — Other Ambulatory Visit: Payer: Self-pay

## 2020-06-28 ENCOUNTER — Ambulatory Visit (HOSPITAL_BASED_OUTPATIENT_CLINIC_OR_DEPARTMENT_OTHER)
Admission: RE | Admit: 2020-06-28 | Discharge: 2020-06-28 | Disposition: A | Discharge status: Home / Self Care | Payer: 59 | Attending: General Surgery | Admitting: General Surgery

## 2020-06-28 ENCOUNTER — Ambulatory Visit (HOSPITAL_BASED_OUTPATIENT_CLINIC_OR_DEPARTMENT_OTHER): Payer: 59 | Admitting: Anesthesiology

## 2020-06-28 ENCOUNTER — Ambulatory Visit
Admission: RE | Admit: 2020-06-28 | Discharge: 2020-06-28 | Disposition: A | Discharge status: Home / Self Care | Payer: 59 | Source: Ambulatory Visit | Attending: General Surgery | Admitting: General Surgery

## 2020-06-28 ENCOUNTER — Encounter (HOSPITAL_BASED_OUTPATIENT_CLINIC_OR_DEPARTMENT_OTHER): Payer: Self-pay | Admitting: General Surgery

## 2020-06-28 ENCOUNTER — Encounter (HOSPITAL_BASED_OUTPATIENT_CLINIC_OR_DEPARTMENT_OTHER)
Admission: RE | Disposition: A | Discharge status: Home / Self Care | Payer: Self-pay | Source: Home / Self Care | Attending: General Surgery

## 2020-06-28 ENCOUNTER — Other Ambulatory Visit (HOSPITAL_BASED_OUTPATIENT_CLINIC_OR_DEPARTMENT_OTHER): Payer: Self-pay | Admitting: General Surgery

## 2020-06-28 DIAGNOSIS — R928 Other abnormal and inconclusive findings on diagnostic imaging of breast: Secondary | ICD-10-CM | POA: Diagnosis not present

## 2020-06-28 DIAGNOSIS — D241 Benign neoplasm of right breast: Secondary | ICD-10-CM

## 2020-06-28 DIAGNOSIS — J45909 Unspecified asthma, uncomplicated: Secondary | ICD-10-CM | POA: Diagnosis not present

## 2020-06-28 DIAGNOSIS — I10 Essential (primary) hypertension: Secondary | ICD-10-CM | POA: Diagnosis not present

## 2020-06-28 DIAGNOSIS — Z803 Family history of malignant neoplasm of breast: Secondary | ICD-10-CM | POA: Insufficient documentation

## 2020-06-28 DIAGNOSIS — N6081 Other benign mammary dysplasias of right breast: Secondary | ICD-10-CM | POA: Diagnosis not present

## 2020-06-28 DIAGNOSIS — N6041 Mammary duct ectasia of right breast: Secondary | ICD-10-CM | POA: Insufficient documentation

## 2020-06-28 DIAGNOSIS — N6011 Diffuse cystic mastopathy of right breast: Secondary | ICD-10-CM | POA: Diagnosis not present

## 2020-06-28 DIAGNOSIS — E119 Type 2 diabetes mellitus without complications: Secondary | ICD-10-CM | POA: Diagnosis not present

## 2020-06-28 DIAGNOSIS — N6310 Unspecified lump in the right breast, unspecified quadrant: Secondary | ICD-10-CM | POA: Diagnosis not present

## 2020-06-28 DIAGNOSIS — Z794 Long term (current) use of insulin: Secondary | ICD-10-CM | POA: Diagnosis not present

## 2020-06-28 HISTORY — PX: RADIOACTIVE SEED GUIDED EXCISIONAL BREAST BIOPSY: SHX6490

## 2020-06-28 HISTORY — DX: Sleep apnea, unspecified: G47.30

## 2020-06-28 HISTORY — PX: BREAST EXCISIONAL BIOPSY: SUR124

## 2020-06-28 LAB — POCT PREGNANCY, URINE: Preg Test, Ur: NEGATIVE

## 2020-06-28 LAB — GLUCOSE, CAPILLARY
Glucose-Capillary: 168 mg/dL — ABNORMAL HIGH (ref 70–99)
Glucose-Capillary: 156 mg/dL — ABNORMAL HIGH (ref 70–99)
Glucose-Capillary: 159 mg/dL — ABNORMAL HIGH (ref 70–99)

## 2020-06-28 IMAGING — MG MM BREAST SURGICAL SPECIMEN
1 series · 2 of 2 positions shown · non-contrast
Comparison: Previous exam(s).

CLINICAL DATA: Status post radioactive seed localization of the
RIGHT breast and lumpectomy.

EXAM:
SPECIMEN RADIOGRAPH OF THE RIGHT BREAST

[Series 1: R · right · 0.07mm/px · 2 of 2 slices shown]
[im 1/2]
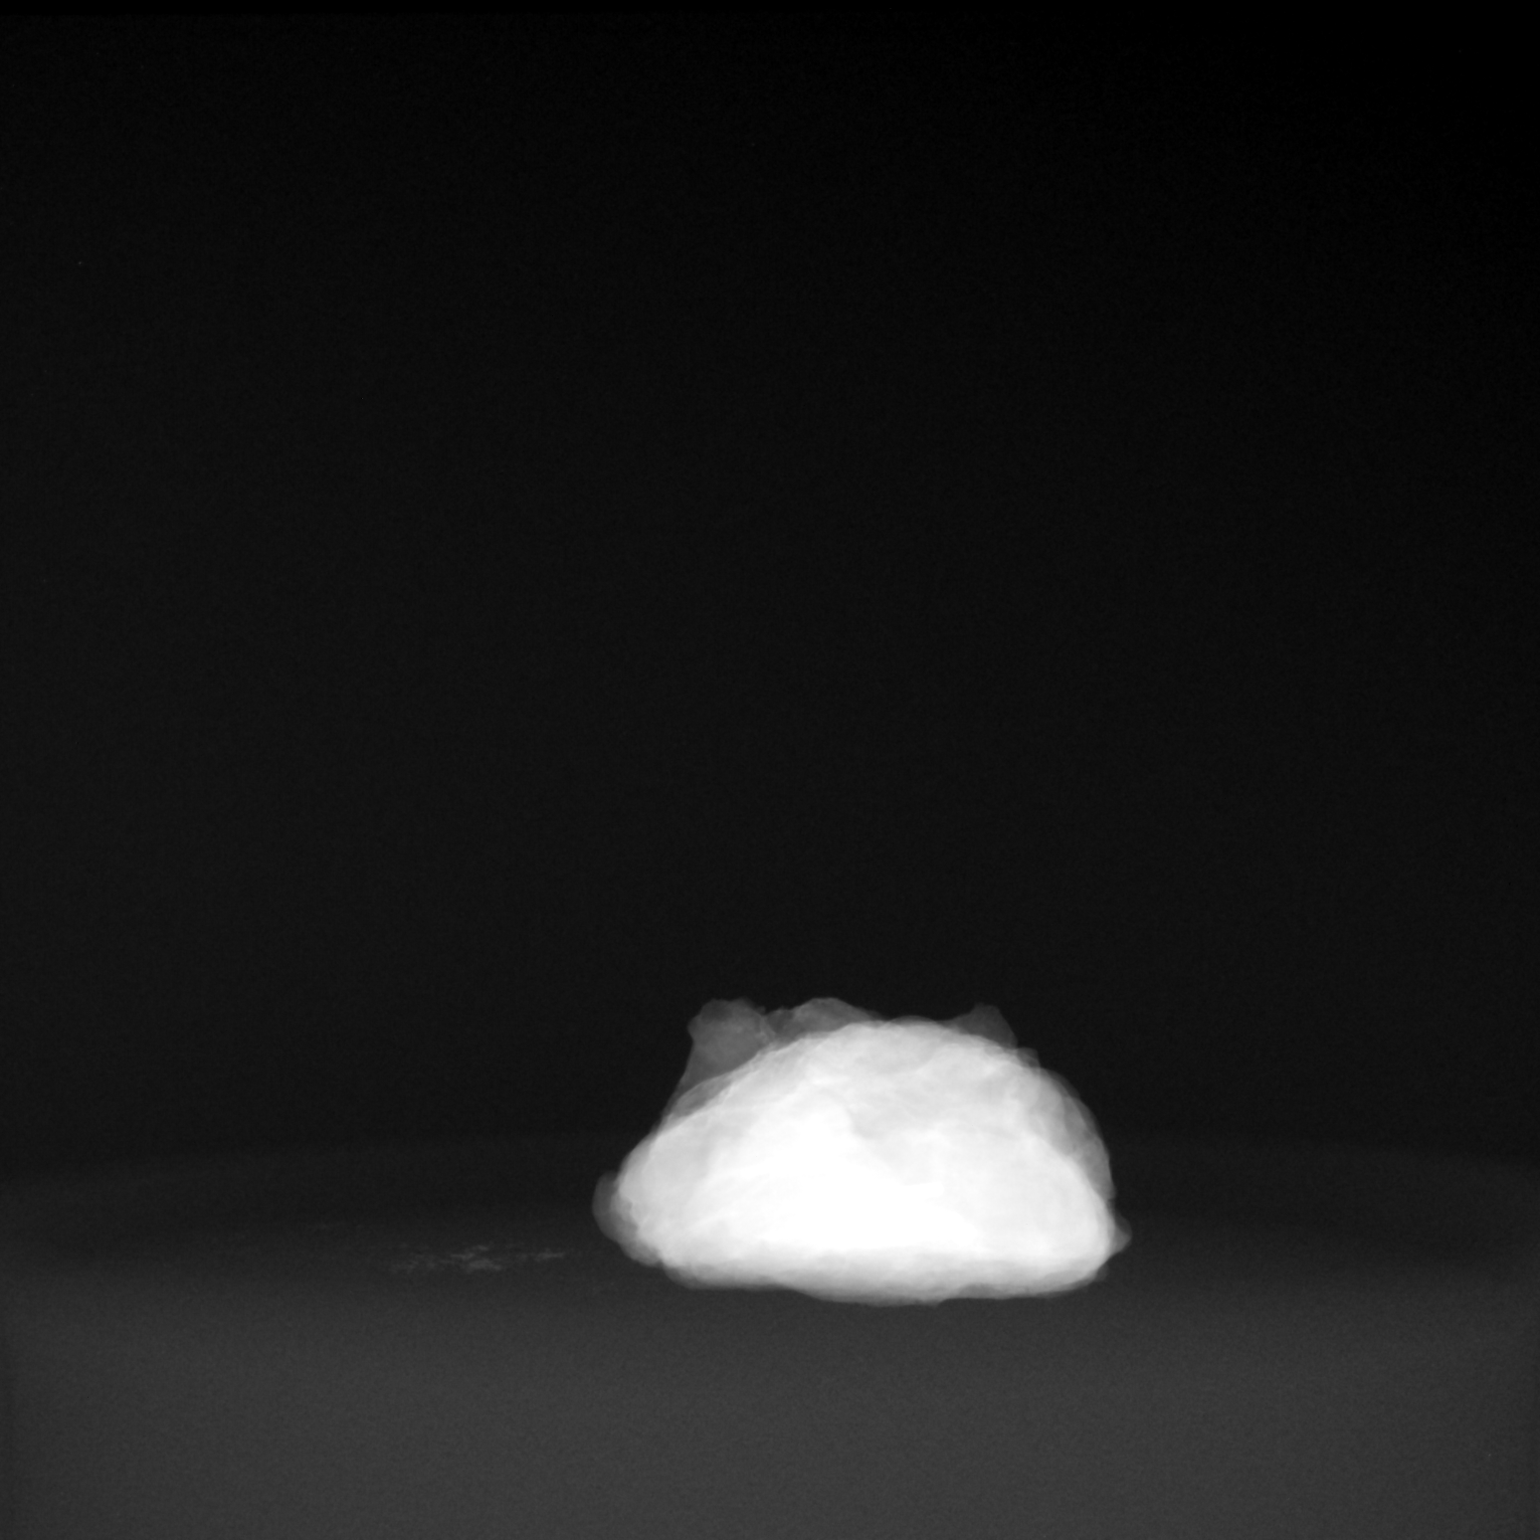
[im 2/2]
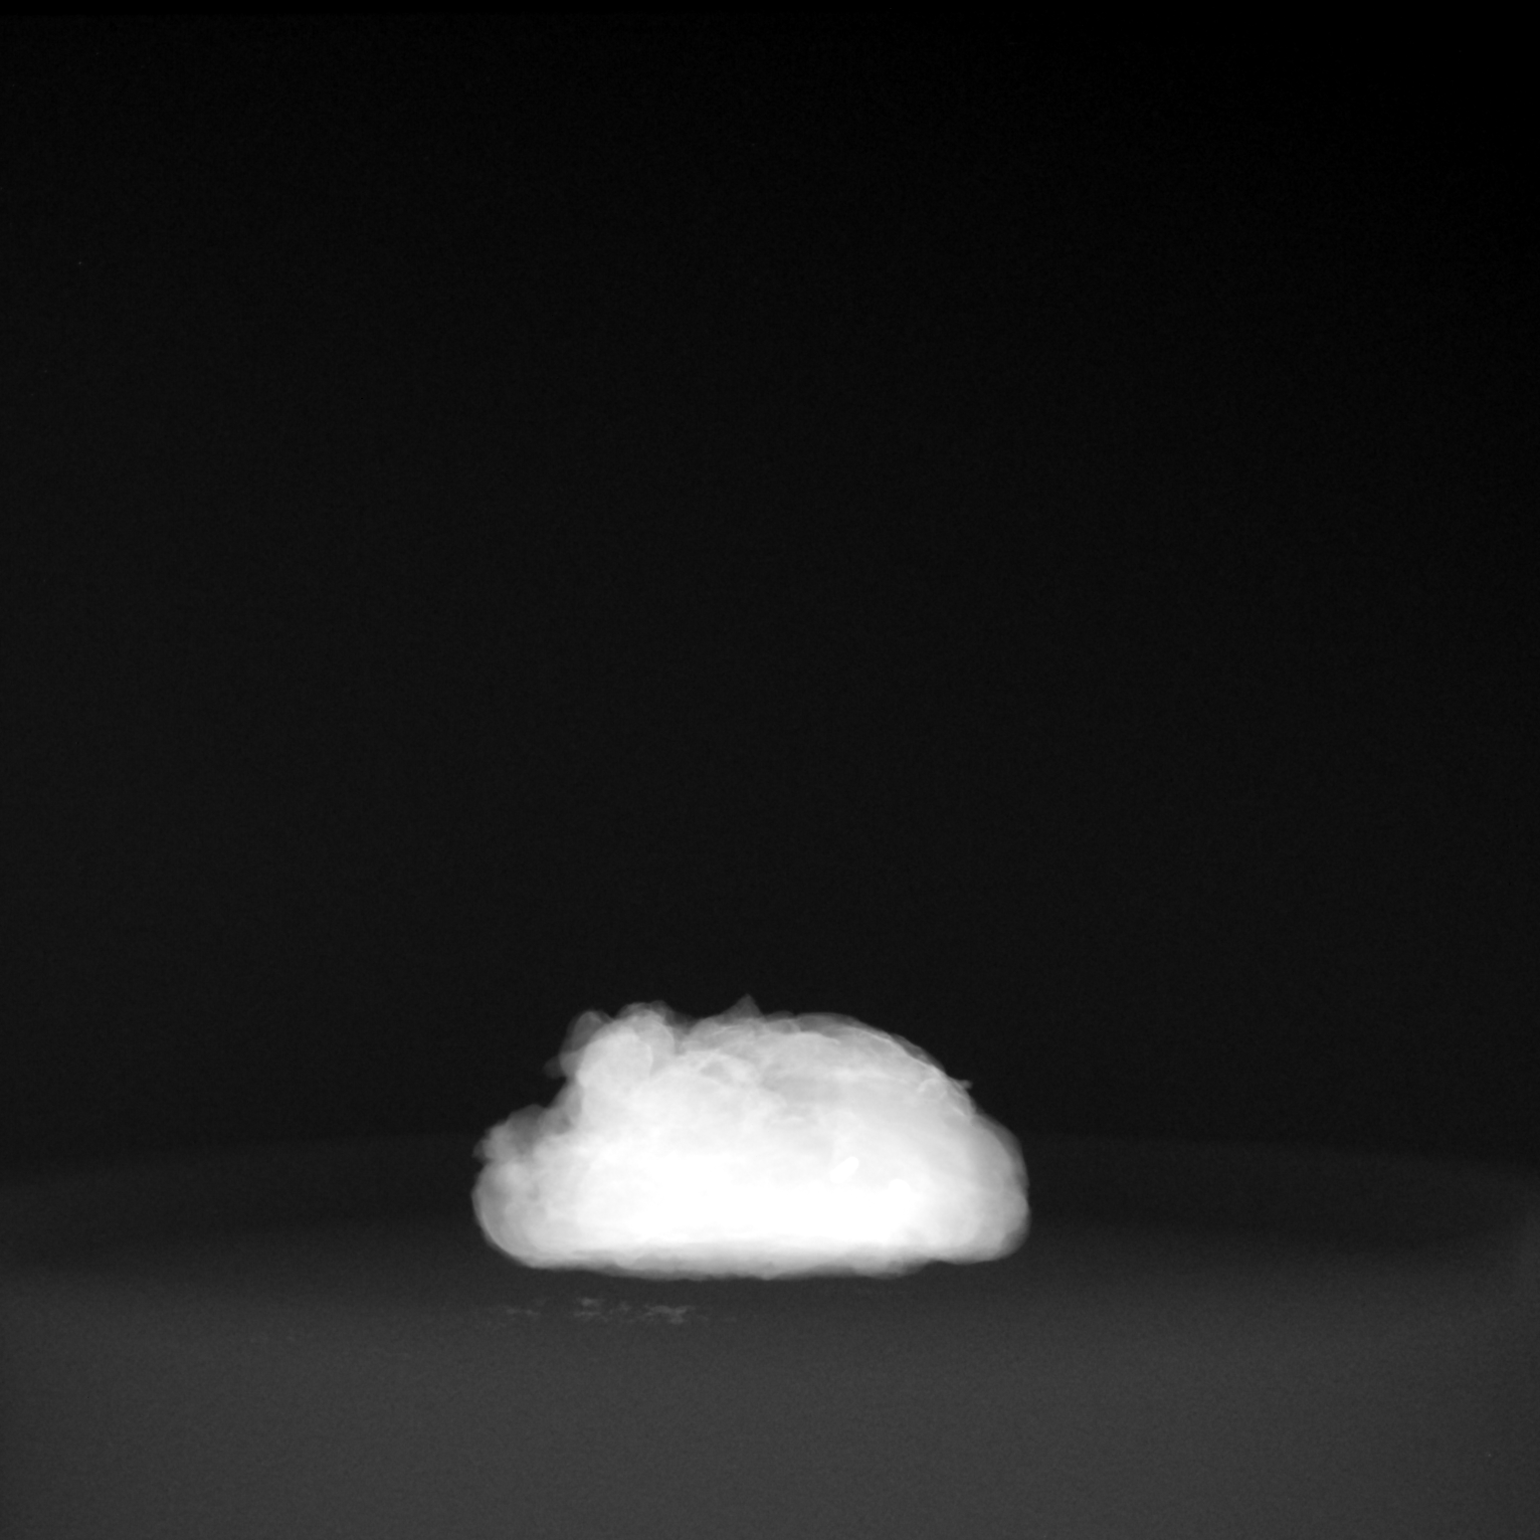

[2 of 2 positions shown; findings below may reference images not displayed]

FINDINGS: Status post excision of the right breast. The radioactive seed and
barbell shaped biopsy marker clip are present, completely intact,
and were marked for pathology.
IMPRESSION: Specimen radiograph of the RIGHT breast.

## 2020-06-28 SURGERY — RADIOACTIVE SEED GUIDED BREAST BIOPSY
Anesthesia: General | Site: Breast | Laterality: Right

## 2020-06-28 MED ORDER — LIDOCAINE 2% (20 MG/ML) 5 ML SYRINGE
INTRAMUSCULAR | Status: AC
  Filled 2020-06-28: qty 5

## 2020-06-28 MED ORDER — KETOROLAC TROMETHAMINE 15 MG/ML IJ SOLN
INTRAMUSCULAR | Status: AC
  Filled 2020-06-28: qty 1

## 2020-06-28 MED ORDER — OXYCODONE HCL 5 MG PO TABS
5.0000 mg | ORAL_TABLET | Freq: Once | ORAL | Status: AC | PRN
  Administered 2020-06-28: 5 mg via ORAL

## 2020-06-28 MED ORDER — OXYCODONE HCL 5 MG PO TABS
ORAL_TABLET | ORAL | Status: AC
  Filled 2020-06-28: qty 1

## 2020-06-28 MED ORDER — LACTATED RINGERS IV SOLN: INTRAVENOUS | Status: DC

## 2020-06-28 MED ORDER — ACETAMINOPHEN 325 MG PO TABS: 325.0000 mg | ORAL_TABLET | ORAL | Status: DC | PRN

## 2020-06-28 MED ORDER — ACETAMINOPHEN 500 MG PO TABS
ORAL_TABLET | ORAL | Status: AC
  Filled 2020-06-28: qty 2

## 2020-06-28 MED ORDER — MIDAZOLAM HCL 2 MG/2ML IJ SOLN
INTRAMUSCULAR | Status: AC
  Filled 2020-06-28: qty 2

## 2020-06-28 MED ORDER — BUPIVACAINE HCL (PF) 0.25 % IJ SOLN
INTRAMUSCULAR | Status: DC | PRN
  Administered 2020-06-28: 10 mL

## 2020-06-28 MED ORDER — CEFAZOLIN SODIUM-DEXTROSE 2-4 GM/100ML-% IV SOLN
INTRAVENOUS | Status: AC
  Filled 2020-06-28: qty 100

## 2020-06-28 MED ORDER — GABAPENTIN 100 MG PO CAPS
ORAL_CAPSULE | ORAL | Status: AC
  Filled 2020-06-28: qty 1

## 2020-06-28 MED ORDER — KETOROLAC TROMETHAMINE 15 MG/ML IJ SOLN
15.0000 mg | INTRAMUSCULAR | Status: AC
  Administered 2020-06-28: 15 mg via INTRAVENOUS

## 2020-06-28 MED ORDER — ONDANSETRON HCL 4 MG/2ML IJ SOLN
INTRAMUSCULAR | Status: DC | PRN
  Administered 2020-06-28: 4 mg via INTRAVENOUS

## 2020-06-28 MED ORDER — MIDAZOLAM HCL 5 MG/5ML IJ SOLN
INTRAMUSCULAR | Status: DC | PRN
  Administered 2020-06-28: 2 mg via INTRAVENOUS

## 2020-06-28 MED ORDER — CEFAZOLIN SODIUM-DEXTROSE 2-4 GM/100ML-% IV SOLN
2.0000 g | INTRAVENOUS | Status: AC
  Administered 2020-06-28: 2 g via INTRAVENOUS

## 2020-06-28 MED ORDER — DEXAMETHASONE SODIUM PHOSPHATE 10 MG/ML IJ SOLN
INTRAMUSCULAR | Status: DC | PRN
  Administered 2020-06-28: 5 mg via INTRAVENOUS

## 2020-06-28 MED ORDER — ACETAMINOPHEN 160 MG/5ML PO SOLN: 325.0000 mg | ORAL | Status: DC | PRN

## 2020-06-28 MED ORDER — LIDOCAINE 2% (20 MG/ML) 5 ML SYRINGE
INTRAMUSCULAR | Status: DC | PRN
  Administered 2020-06-28: 100 mg via INTRAVENOUS

## 2020-06-28 MED ORDER — MEPERIDINE HCL 25 MG/ML IJ SOLN: 6.2500 mg | INTRAMUSCULAR | Status: DC | PRN

## 2020-06-28 MED ORDER — ACETAMINOPHEN 500 MG PO TABS
1000.0000 mg | ORAL_TABLET | ORAL | Status: AC
  Administered 2020-06-28: 1000 mg via ORAL

## 2020-06-28 MED ORDER — PROPOFOL 10 MG/ML IV BOLUS
INTRAVENOUS | Status: DC | PRN
  Administered 2020-06-28: 200 mg via INTRAVENOUS

## 2020-06-28 MED ORDER — ONDANSETRON HCL 4 MG/2ML IJ SOLN: 4.0000 mg | Freq: Once | INTRAMUSCULAR | Status: DC | PRN

## 2020-06-28 MED ORDER — PROPOFOL 500 MG/50ML IV EMUL
INTRAVENOUS | Status: DC | PRN
  Administered 2020-06-28: 25 ug/kg/min via INTRAVENOUS

## 2020-06-28 MED ORDER — OXYCODONE HCL 5 MG/5ML PO SOLN: 5.0000 mg | Freq: Once | ORAL | Status: AC | PRN

## 2020-06-28 MED ORDER — GABAPENTIN 100 MG PO CAPS
100.0000 mg | ORAL_CAPSULE | ORAL | Status: AC
  Administered 2020-06-28: 100 mg via ORAL

## 2020-06-28 MED ORDER — ONDANSETRON HCL 4 MG/2ML IJ SOLN
INTRAMUSCULAR | Status: AC
  Filled 2020-06-28: qty 4

## 2020-06-28 MED ORDER — KETOROLAC TROMETHAMINE 15 MG/ML IJ SOLN: 15.0000 mg | Freq: Once | INTRAMUSCULAR | Status: DC

## 2020-06-28 MED ORDER — PROPOFOL 10 MG/ML IV BOLUS
INTRAVENOUS | Status: AC
  Filled 2020-06-28: qty 20

## 2020-06-28 MED ORDER — TRAMADOL HCL 50 MG PO TABS: 100.0000 mg | ORAL_TABLET | Freq: Four times a day (QID) | ORAL | 1 refills | Status: DC | PRN

## 2020-06-28 MED ORDER — FENTANYL CITRATE (PF) 100 MCG/2ML IJ SOLN
INTRAMUSCULAR | Status: AC
  Filled 2020-06-28: qty 2

## 2020-06-28 MED ORDER — FENTANYL CITRATE (PF) 250 MCG/5ML IJ SOLN
INTRAMUSCULAR | Status: DC | PRN
Start: 2020-06-28 — End: 2020-06-28
  Administered 2020-06-28 (×4): 25 ug via INTRAVENOUS

## 2020-06-28 MED ORDER — FENTANYL CITRATE (PF) 100 MCG/2ML IJ SOLN
25.0000 ug | INTRAMUSCULAR | Status: DC | PRN
  Administered 2020-06-28 (×3): 25 ug via INTRAVENOUS

## 2020-06-28 MED FILL — traMADol HCL 50 MG TABS: 50 | 1 days supply | Qty: 6 | Fill #0

## 2020-06-28 SURGICAL SUPPLY — 38 items
ADH SKN CLS APL DERMABOND .7 (GAUZE/BANDAGES/DRESSINGS) ×1
APL PRP STRL LF DISP 70% ISPRP (MISCELLANEOUS) ×1
BINDER BREAST XLRG (GAUZE/BANDAGES/DRESSINGS) IMPLANT
BINDER BREAST XXLRG (GAUZE/BANDAGES/DRESSINGS) ×2 IMPLANT
BLADE SURG 15 STRL LF DISP TIS (BLADE) ×1 IMPLANT
BLADE SURG 15 STRL SS (BLADE) ×2
CHLORAPREP W/TINT 26 (MISCELLANEOUS) ×2 IMPLANT
COVER BACK TABLE 60X90IN (DRAPES) ×2 IMPLANT
COVER MAYO STAND STRL (DRAPES) ×2 IMPLANT
COVER PROBE W GEL 5X96 (DRAPES) ×2 IMPLANT
DERMABOND ADVANCED (GAUZE/BANDAGES/DRESSINGS) ×1
DERMABOND ADVANCED .7 DNX12 (GAUZE/BANDAGES/DRESSINGS) ×1 IMPLANT
DRAPE LAPAROSCOPIC ABDOMINAL (DRAPES) ×2 IMPLANT
DRAPE UTILITY XL STRL (DRAPES) ×2 IMPLANT
ELECT COATED BLADE 2.86 ST (ELECTRODE) ×2 IMPLANT
ELECT REM PT RETURN 9FT ADLT (ELECTROSURGICAL) ×2
ELECTRODE REM PT RTRN 9FT ADLT (ELECTROSURGICAL) ×1 IMPLANT
GLOVE BIO SURGEON STRL SZ7 (GLOVE) ×6 IMPLANT
GLOVE BIO SURGEON STRL SZ7.5 (GLOVE) ×2 IMPLANT
GLOVE BIOGEL PI IND STRL 7.5 (GLOVE) ×2 IMPLANT
GLOVE BIOGEL PI INDICATOR 7.5 (GLOVE) ×2
GOWN STRL REUS W/ TWL LRG LVL3 (GOWN DISPOSABLE) ×3 IMPLANT
GOWN STRL REUS W/TWL LRG LVL3 (GOWN DISPOSABLE) ×6
KIT MARKER MARGIN INK (KITS) ×2 IMPLANT
NEEDLE HYPO 25X1 1.5 SAFETY (NEEDLE) ×2 IMPLANT
PACK BASIN DAY SURGERY FS (CUSTOM PROCEDURE TRAY) ×2 IMPLANT
PENCIL SMOKE EVACUATOR (MISCELLANEOUS) ×2 IMPLANT
SLEEVE SCD COMPRESS KNEE MED (MISCELLANEOUS) ×2 IMPLANT
SPONGE LAP 4X18 RFD (DISPOSABLE) ×2 IMPLANT
STRIP CLOSURE SKIN 1/2X4 (GAUZE/BANDAGES/DRESSINGS) ×2 IMPLANT
SUT MON AB 5-0 PS2 18 (SUTURE) ×2 IMPLANT
SUT VIC AB 2-0 SH 27 (SUTURE) ×2
SUT VIC AB 2-0 SH 27XBRD (SUTURE) ×1 IMPLANT
SUT VIC AB 3-0 SH 27 (SUTURE) ×2
SUT VIC AB 3-0 SH 27X BRD (SUTURE) ×1 IMPLANT
SYR CONTROL 10ML LL (SYRINGE) ×2 IMPLANT
TOWEL GREEN STERILE FF (TOWEL DISPOSABLE) ×2 IMPLANT
TRAY FAXITRON CT DISP (TRAY / TRAY PROCEDURE) ×2 IMPLANT

## 2020-06-28 NOTE — Discharge Instructions (Signed)
[place] Office Phone Number [phone] POST OP INSTRUCTIONS Take 400 mg of ibuprofen every 8 hours or 650 mg tylenol every 6 hours for next 72 hours then as needed. Use ice several times daily also. Always review your discharge instruction sheet given to you by the facility where your surgery was performed.  IF YOU HAVE DISABILITY OR FAMILY LEAVE FORMS, YOU MUST BRING THEM TO THE OFFICE FOR PROCESSING.  DO NOT GIVE THEM TO YOUR DOCTOR.  1. A prescription for pain medication may be given to you upon discharge.  Take your pain medication as prescribed, if needed.  If narcotic pain medicine is not needed, then you may take acetaminophen (Tylenol), naprosyn (Alleve) or ibuprofen (Advil) as needed. 2. Take your usually prescribed medications unless otherwise directed 3. If you need a refill on your pain medication, please contact your pharmacy.  They will contact our office to request authorization.  Prescriptions will not be filled after 5pm or on week-ends. 4. You should eat very light the first 24 hours after surgery, such as soup, crackers, pudding, etc.  Resume your normal diet the day after surgery. 5. Most patients will experience some swelling and bruising in the breast.  Ice packs and a good support bra will help.  Wear the breast binder provided or a sports bra for 72 hours day and night.  After that wear a sports bra during the day until you return to the office. Swelling and bruising can take several days to resolve.  6. It is common to experience some constipation if taking pain medication after surgery.  Increasing fluid intake and taking a stool softener will usually help or prevent this problem from occurring.  A mild laxative (Milk of Magnesia or Miralax) should be taken according to package directions if there are no bowel movements after 48 hours. 7. Unless discharge instructions indicate otherwise, you may remove your bandages 48 hours after surgery and you may  shower at that time.  You may have steri-strips (small skin tapes) in place directly over the incision.  These strips should be left on the skin for 7-10 days and will come off on their own.  If your surgeon used skin glue on the incision, you may shower in 24 hours.  The glue will flake off over the next 2-3 weeks.  Any sutures or staples will be removed at the office during your follow-up visit. 8. ACTIVITIES:  You may resume regular daily activities (gradually increasing) beginning the next day.  Wearing a good support bra or sports bra minimizes pain and swelling.  You may have sexual intercourse when it is comfortable. a. You may drive when you no longer are taking prescription pain medication, you can comfortably wear a seatbelt, and you can safely maneuver your car and apply brakes. b. RETURN TO WORK:  ______________________________________________________________________________________ 9. You should see your doctor in the office for a follow-up appointment approximately two weeks after your surgery.  Your doctor's nurse will typically make your follow-up appointment when she calls you with your pathology report.  Expect your pathology report 3-4 business days after your surgery.  You may call to check if you do not hear from [place] after three days. 10. OTHER INSTRUCTIONS: _______________________________________________________________________________________________ _____________________________________________________________________________________________________________________________________ _____________________________________________________________________________________________________________________________________ _____________________________________________________________________________________________________________________________________  WHEN TO CALL DR WAKEFIELD: 1. Fever over 101.0 2. Nausea and/or vomiting. 3. Extreme swelling or bruising. 4. Continued bleeding from  incision. 5. Increased pain, redness, or drainage from the incision.  The clinic staff is available to answer  your questions during regular business hours.  Please don't hesitate to call and ask to speak to one of the nurses for clinical concerns.  If you have a medical emergency, go to the nearest emergency room or call 911.  A surgeon from [facility] is always on call at the hospital.  For further questions, please visit [email or web address]   Post Anesthesia Home Care Instructions  Activity: Get plenty of rest for the remainder of the day. A responsible individual must stay with you for 24 hours following the procedure.  For the next 24 hours, DO NOT: -Drive a car -Paediatric nurse -Drink alcoholic beverages -Take any medication unless instructed by your physician -Make any legal decisions or sign important papers.  Meals: Start with liquid foods such as gelatin or soup. Progress to regular foods as tolerated. Avoid greasy, spicy, heavy foods. If nausea and/or vomiting occur, drink only clear liquids until the nausea and/or vomiting subsides. Call your physician if vomiting continues.  Special Instructions/Symptoms: Your throat may feel dry or sore from the anesthesia or the breathing tube placed in your throat during surgery. If this causes discomfort, gargle with warm salt water. The discomfort should disappear within 24 hours.  If you had a scopolamine patch placed behind your ear for the management of post- operative nausea and/or vomiting:  1. The medication in the patch is effective for 72 hours, after which it should be removed.  Wrap patch in a tissue and discard in the trash. Wash hands thoroughly with soap and water. 2. You may remove the patch earlier than 72 hours if you experience unpleasant side effects which may include dry mouth, dizziness or visual disturbances. 3. Avoid touching the patch. Wash your hands with soap and water after contact  with the patch.   May take Tylenol and Ibuprofen at 2pm, if needed.   Pt to not drive 5-7 days  No lifting >5lbs for 2weeks

## 2020-06-28 NOTE — Op Note (Addendum)
Preoperative diagnosis:ductal papilloma Postoperative diagnosis: Same as above Procedure: Right breast seed guided excisional biopsy Surgeon: Dr. 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 she feels like she has palpated bilateral masses and bilateral breast pain. she has c density breasts. no nipple dc. she had mm 2/21 that is negative with bbs placed at site of her palpable areas. she also has no targeted sonographic abnormalities noted. she is referred here for evaluation. she has significant family history of breast cancer in her mom and mgm in early 32s, as well as multiple aunts. she has undergone negative panel testing for high risk breast genes. we discussed options last time and elected to first proceed with mr. she has a 1.2 cm mass in uiq of right breast on mri with no other abnormalities. 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   Procedure: After informed consent was obtained the patient was taken the operating.  She had been given antibiotics.  SCDs were in place.  She was placed under general anesthesia without complication.  She was prepped and draped in the standard sterile surgical fashion.  A surgical timeout was then performed.  I infiltrated marcaine and made a periareolar incision to hide the scar.    I then dissected down to the seed.  I used the neoprobe to remove the seed and the clip.   I then marked this with paint.  Mammography confirmed removal of the clip and the seed.   Hemostasis was then obtained.   I then closed the breast tissue with 2-0 Vicryl.  The skin was closed with 3-0 Vicryl and 5-0  Monocryl.  Glue and Steri-Strips were applied.  She tolerated this well was extubated and transferred to  recovery stable.

## 2020-06-28 NOTE — Anesthesia Postprocedure Evaluation (Signed)
Anesthesia Post Note  Patient: Julie Hansen  Procedure(s) Performed: RIGHT RADIOACTIVE SEED GUIDED EXCISIONAL BREAST BIOPSY (Right Breast)     Patient location during evaluation: PACU Anesthesia Type: General Level of consciousness: awake and alert Pain management: pain level controlled Vital Signs Assessment: post-procedure vital signs reviewed and stable Respiratory status: spontaneous breathing, nonlabored ventilation, respiratory function stable and patient connected to nasal cannula oxygen Cardiovascular status: blood pressure returned to baseline and stable Postop Assessment: no apparent nausea or vomiting Anesthetic complications: no   No complications documented.  Last Vitals:  Vitals:   06/28/20 1018 06/28/20 1040  BP: 106/72 114/70  Pulse: 85 81  Resp: 20 18  Temp:  37.1 C  SpO2: 96% 96%    Last Pain:  Vitals:   06/28/20 1040  TempSrc:   PainSc: 4                  [name]

## 2020-06-28 NOTE — Anesthesia Procedure Notes (Signed)
Procedure Name: LMA Insertion Date/Time: 06/28/2020 9:05 AM Performed by: Myna Bright, CRNA Pre-anesthesia Checklist: Patient identified, Emergency Drugs available, Suction available and Patient being monitored Patient Re-evaluated:Patient Re-evaluated prior to induction Oxygen Delivery Method: Circle system utilized Preoxygenation: Pre-oxygenation with 100% oxygen Induction Type: IV induction Ventilation: Mask ventilation without difficulty LMA: LMA inserted LMA Size: 4.0 Number of attempts: 1 Placement Confirmation: positive ETCO2 and breath sounds checked- equal and bilateral Tube secured with: Tape Dental Injury: Teeth and Oropharynx as per pre-operative assessment

## 2020-06-28 NOTE — Transfer of Care (Signed)
Immediate Anesthesia Transfer of Care Note  Patient: Julie Hansen  Procedure(s) Performed: RIGHT RADIOACTIVE SEED GUIDED EXCISIONAL BREAST BIOPSY (Right Breast)  Patient Location: PACU  Anesthesia Type:General  Level of Consciousness: awake, alert , oriented and patient cooperative  Airway & Oxygen Therapy: Patient Spontanous Breathing and Patient connected to face mask oxygen  Post-op Assessment: Report given to RN, Post -op Vital signs reviewed and stable and Patient moving all extremities  Post vital signs: Reviewed and stable  Last Vitals:  Vitals Value Taken Time  BP 105/52 06/28/20 0949  Temp    Pulse 87 06/28/20 0949  Resp 15 06/28/20 0949  SpO2 100 % 06/28/20 0949  Vitals shown include unvalidated device data.  Last Pain:  Vitals:   06/28/20 0806  TempSrc: Oral  PainSc: 0-No pain      Patients Stated Pain Goal: 3 (37/54/36 0677)  Complications: No complications documented.

## 2020-06-28 NOTE — H&P (Signed)
   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 she has c density breasts. no nipple dc. she had mm 2/21 that is negative with bbs placed at site of her palpable areas. she also has no targeted sonographic abnormalities noted. she is referred here for evaluation. she has significant family history of breast cancer in her mom and mgm in early 22s, as well as multiple aunts. she has undergone negative panel testing for high risk breast genes. we discussed options last time and elected to first proceed with mr. she has a 1.2 cm mass in uiq of right breast on mri with no other abnormalities. 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   Allergies Macrodantin *URINARY ANTI-INFECTIVES*  Allergies Reconciled   Medication History  ALPRAZolam (0.5MG  Tablet, Oral) Active. Qvar RediHaler (40MCG/ACT Aero Breath Act, Inhalation) Active. Atorvastatin Calcium (40MG  Tablet, Oral) Active. Farxiga (10MG  Tablet, Oral) Active. Ozempic (1 MG/DOSE) (2MG /1.5ML Soln Pen-inj, Subcutaneous) Active. Lisinopril (5MG  Tablet, Oral) Active. Ambien (Oral) Specific strength unknown - Active. Medications Reconciled  Social History  Caffeine use  Tea. Tobacco use  Never smoker.  Family History  Breast Cancer  Mother.  ROS negative  Physical Exam Breast Note: right breast without hematoma, no mass   Assessment & Plan  PAPILLOMA OF RIGHT BREAST (D24.1) Story: Right breast mass seed guided excision discussed that option is to observe papillomas without atypia. at her risk level and over 1 cm I think reasonable to also consider excision which she would prefer. I discussed seed guided excision with risks,recovery. will proceed once these results come back will finalize plans for following her in future

## 2020-06-28 NOTE — Interval H&P Note (Signed)
History and Physical Interval Note:  06/28/2020 8:50 AM  Julie Hansen  has presented today for surgery, with the diagnosis of RIGHT BREAST MASS.  The various methods of treatment have been discussed with the patient and family. After consideration of risks, benefits and other options for treatment, the patient has consented to  Procedure(s): RIGHT RADIOACTIVE SEED GUIDED EXCISIONAL BREAST BIOPSY (Right) as a surgical intervention.  The patient's history has been reviewed, patient examined, no change in status, stable for surgery.  I have reviewed the patient's chart and labs.  Questions were answered to the patient's satisfaction.     Rolm Bookbinder

## 2020-06-28 NOTE — Anesthesia Preprocedure Evaluation (Signed)
Anesthesia Evaluation  Patient identified by MRN, date of birth, ID band Patient awake    Reviewed: Allergy & Precautions, NPO status , Patient's Chart, lab work & pertinent test results  History of Anesthesia Complications (+) PONV  Airway Mallampati: I       Dental no notable dental hx. (+) Teeth Intact   Pulmonary asthma , sleep apnea ,    Pulmonary exam normal breath sounds clear to auscultation       Cardiovascular hypertension, Pt. on medications Normal cardiovascular exam Rhythm:Regular Rate:Normal     Neuro/Psych  Headaches, Anxiety    GI/Hepatic Neg liver ROS,   Endo/Other  diabetes, Well Controlled, Type 2, Insulin Dependent  Renal/GU   negative genitourinary   Musculoskeletal negative musculoskeletal ROS (+)   Abdominal (+) + obese,   Peds  Hematology negative hematology ROS (+)   Anesthesia Other Findings   Reproductive/Obstetrics                             Anesthesia Physical Anesthesia Plan  ASA: II  Anesthesia Plan: General   Post-op Pain Management:    Induction:   PONV Risk Score and Plan: 4 or greater and Ondansetron, Dexamethasone and Midazolam  Airway Management Planned: LMA  Additional Equipment: None  Intra-op Plan:   Post-operative Plan: Extubation in OR  Informed Consent: I have reviewed the patients History and Physical, chart, labs and discussed the procedure including the risks, benefits and alternatives for the proposed anesthesia with the patient or authorized representative who has indicated his/her understanding and acceptance.     Dental advisory given  Plan Discussed with: CRNA  Anesthesia Plan Comments:         Anesthesia Quick Evaluation

## 2020-06-29 ENCOUNTER — Other Ambulatory Visit: Payer: Self-pay | Admitting: General Surgery

## 2020-06-30 ENCOUNTER — Encounter (HOSPITAL_BASED_OUTPATIENT_CLINIC_OR_DEPARTMENT_OTHER): Payer: Self-pay | Admitting: General Surgery

## 2020-06-30 DIAGNOSIS — E119 Type 2 diabetes mellitus without complications: Secondary | ICD-10-CM | POA: Diagnosis not present

## 2020-06-30 LAB — SURGICAL PATHOLOGY

## 2020-07-05 ENCOUNTER — Other Ambulatory Visit: Payer: Self-pay

## 2020-07-05 ENCOUNTER — Ambulatory Visit
Admission: RE | Admit: 2020-07-05 | Discharge: 2020-07-05 | Disposition: A | Discharge status: Home / Self Care | Payer: 59 | Source: Ambulatory Visit | Attending: Family Medicine | Admitting: Family Medicine

## 2020-07-05 ENCOUNTER — Other Ambulatory Visit: Payer: Self-pay | Admitting: Family Medicine

## 2020-07-05 DIAGNOSIS — E669 Obesity, unspecified: Secondary | ICD-10-CM | POA: Diagnosis not present

## 2020-07-05 DIAGNOSIS — G4733 Obstructive sleep apnea (adult) (pediatric): Secondary | ICD-10-CM | POA: Diagnosis not present

## 2020-07-05 DIAGNOSIS — R0682 Tachypnea, not elsewhere classified: Secondary | ICD-10-CM | POA: Diagnosis not present

## 2020-07-05 DIAGNOSIS — I10 Essential (primary) hypertension: Secondary | ICD-10-CM | POA: Diagnosis not present

## 2020-07-05 DIAGNOSIS — K219 Gastro-esophageal reflux disease without esophagitis: Secondary | ICD-10-CM | POA: Diagnosis not present

## 2020-07-05 DIAGNOSIS — F419 Anxiety disorder, unspecified: Secondary | ICD-10-CM | POA: Diagnosis not present

## 2020-07-05 DIAGNOSIS — R000 Tachycardia, unspecified: Secondary | ICD-10-CM | POA: Diagnosis not present

## 2020-07-05 DIAGNOSIS — R0602 Shortness of breath: Secondary | ICD-10-CM | POA: Diagnosis not present

## 2020-07-05 DIAGNOSIS — E119 Type 2 diabetes mellitus without complications: Secondary | ICD-10-CM | POA: Diagnosis not present

## 2020-07-05 DIAGNOSIS — Z803 Family history of malignant neoplasm of breast: Secondary | ICD-10-CM | POA: Diagnosis not present

## 2020-07-05 IMAGING — CT CT ANGIO CHEST
2 of 7 series · 19 of 46 positions shown · IV contrast (APPLIED)
Comparison: Chest CT dated [DATE].

CLINICAL DATA: 48-year-old female with recent breast surgery
presenting with shortness of breath.

EXAM:
CT ANGIOGRAPHY CHEST WITH CONTRAST
TECHNIQUE: Multidetector CT imaging of the chest was performed using the
standard protocol during bolus administration of intravenous
contrast. Multiplanar CT image reconstructions and MIPs were
obtained to evaluate the vascular anatomy.
CONTRAST:  75mL OMNIPAQUE IOHEXOL 350 MG/ML SOLN

[Series 5: thins · axial · 0.68mm/px · z∈[-492,-251]mm · 17 of 265 slices shown]
[im 12/265  lung]
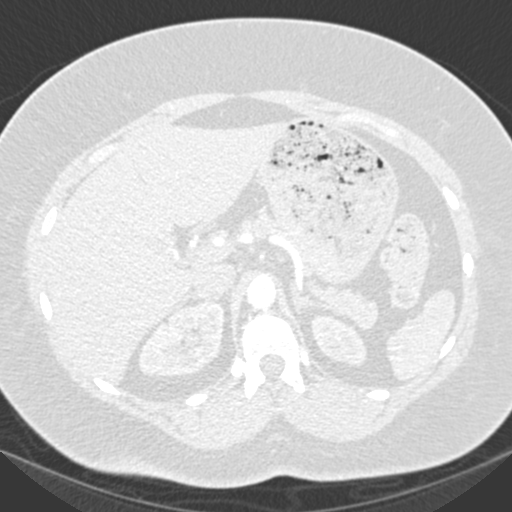
[im 23/265  soft-tissue]
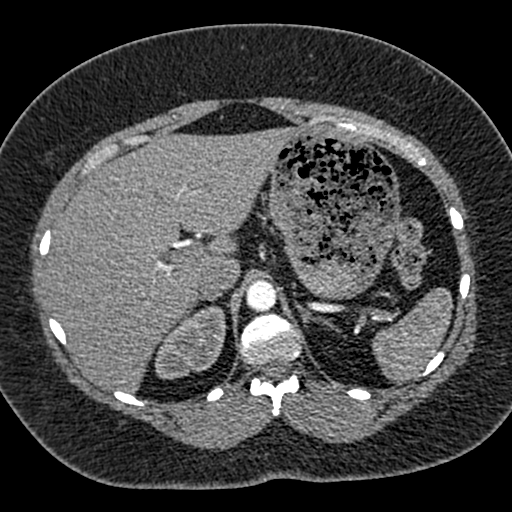
[im 46/265  lung]
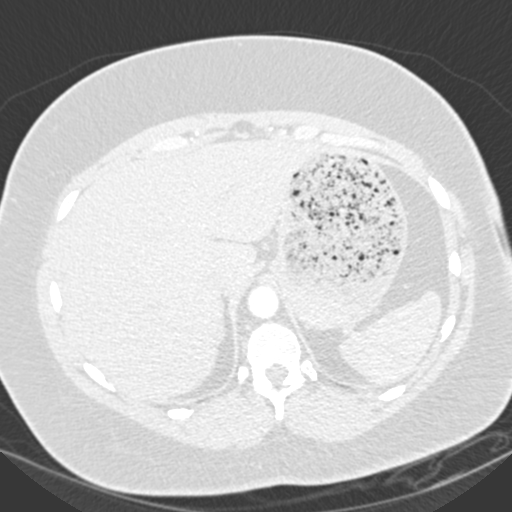
[im 58/265  soft-tissue]
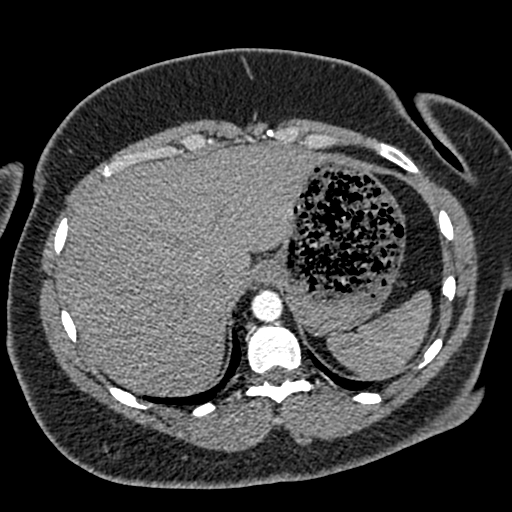
[im 69/265  lung]
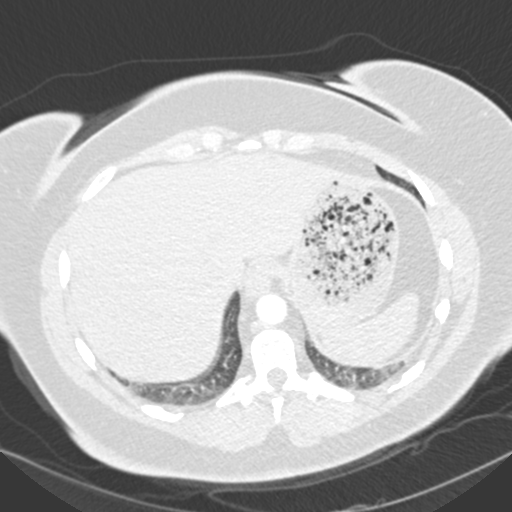
[im 92/265  soft-tissue]
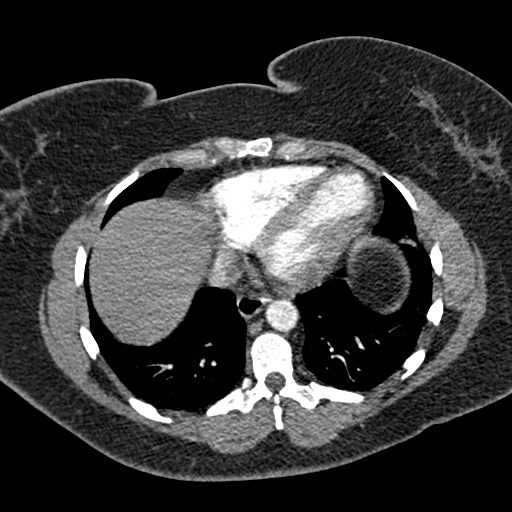
[im 104/265  lung]
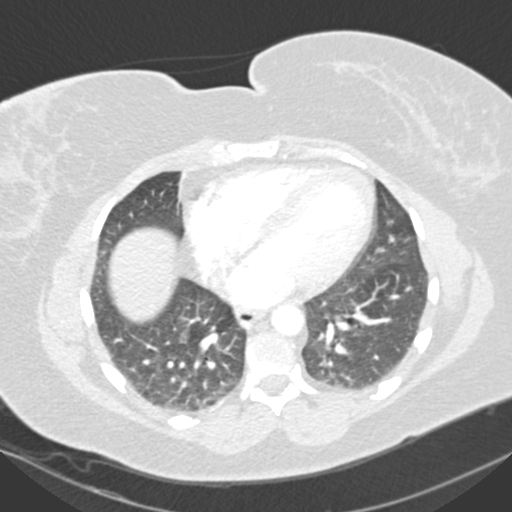
[im 115/265  soft-tissue]
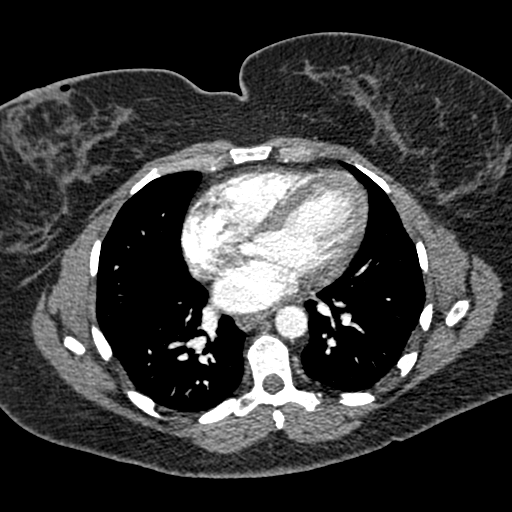
[im 138/265  lung]
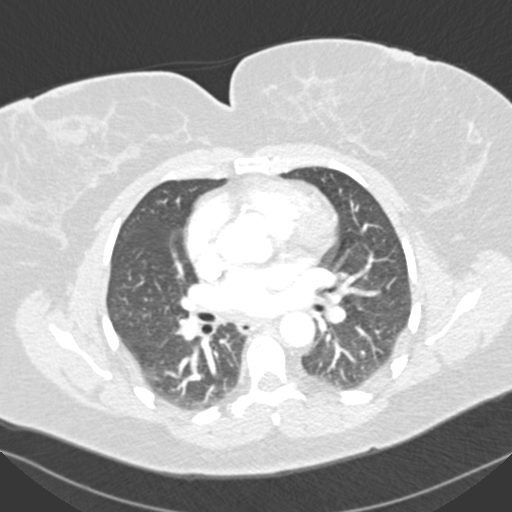
[im 150/265  soft-tissue]
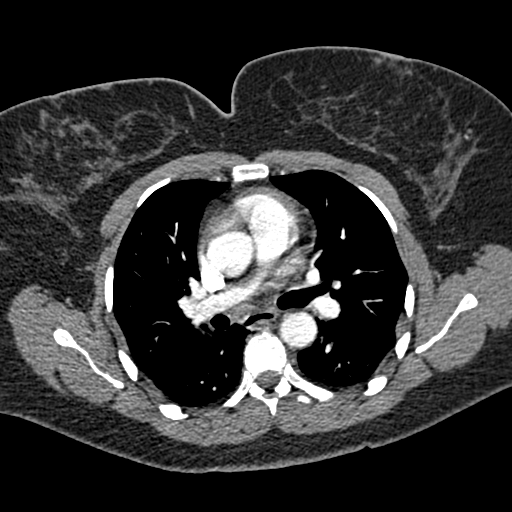
[im 161/265  lung]
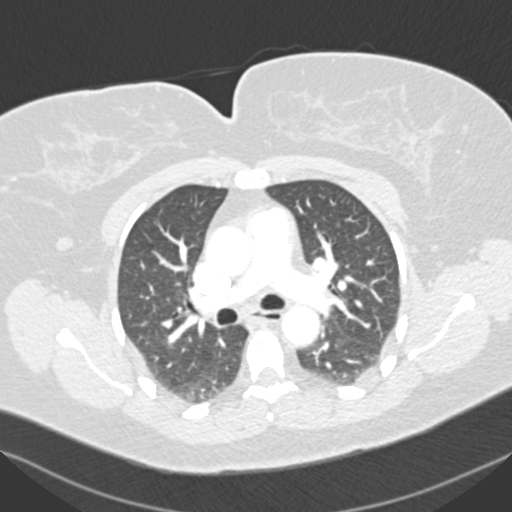
[im 173/265  soft-tissue]
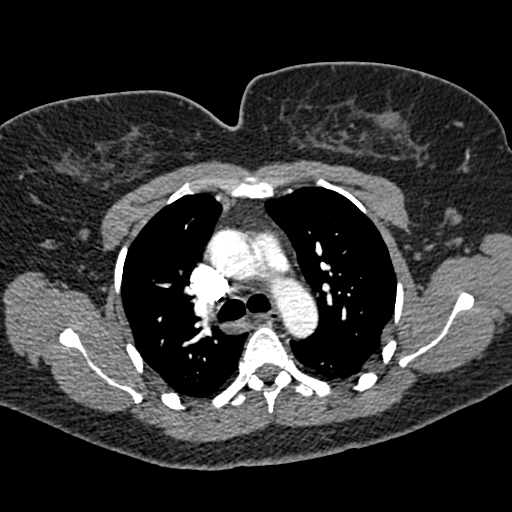
[im 196/265  lung]
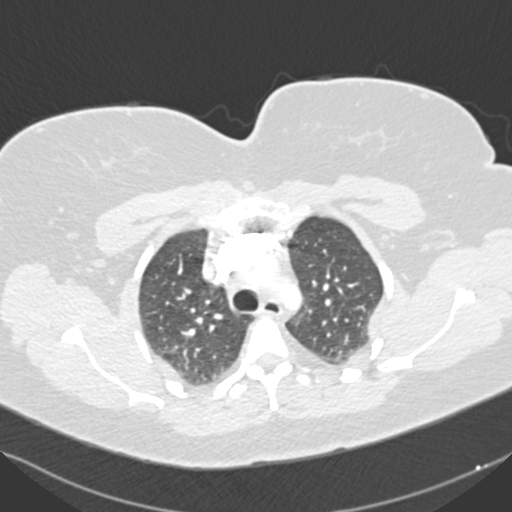
[im 207/265  soft-tissue]
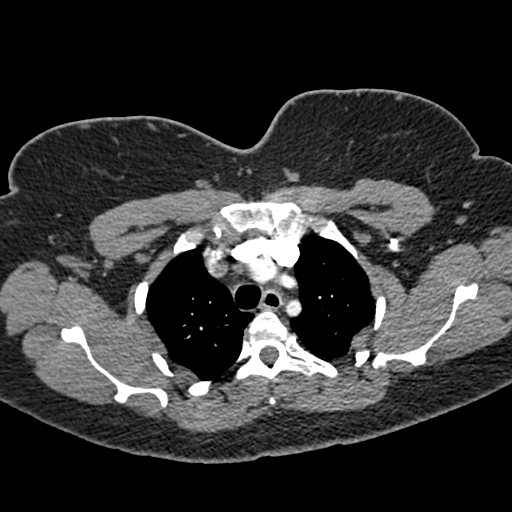
[im 219/265  lung]
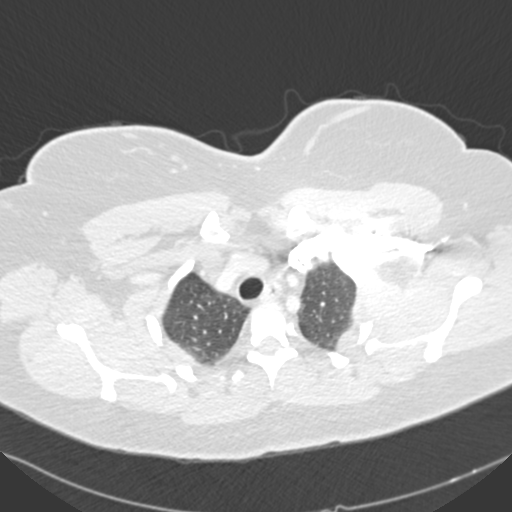
[im 242/265  soft-tissue]
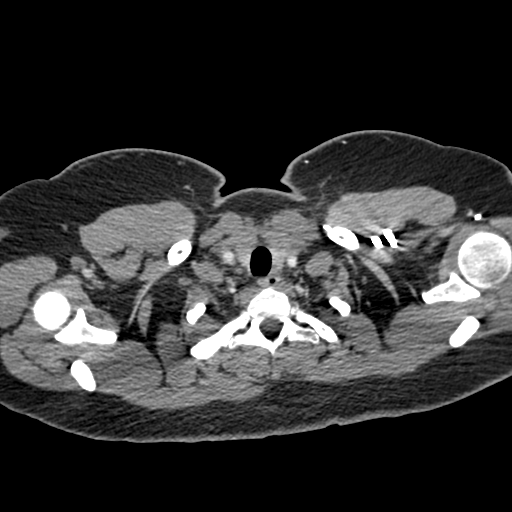
[im 253/265  lung]
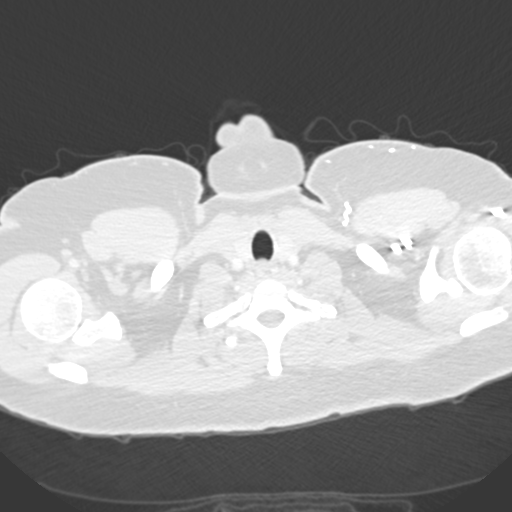

[Series 9: coronal mpr · coronal · 0.52mm/px · 2 of 99 slices shown]
[im 33/99  soft-tissue]
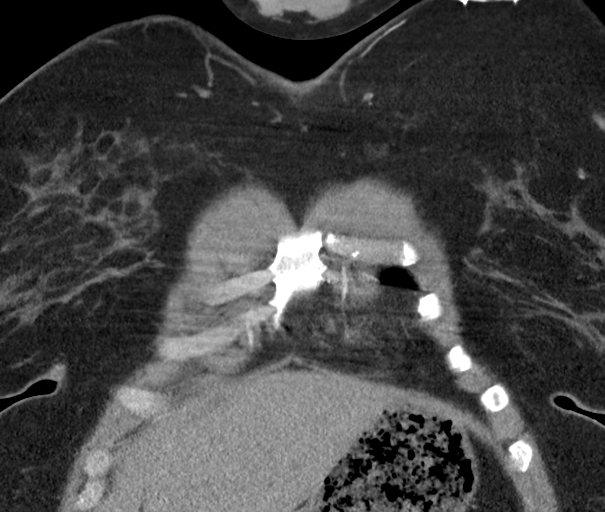
[im 66/99  soft-tissue]
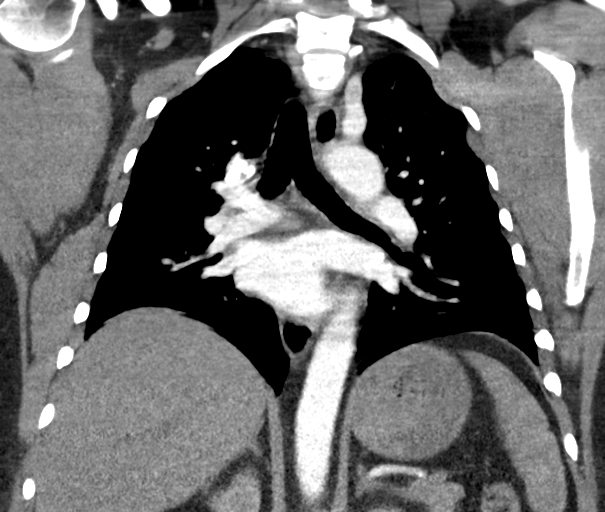

[19 of 46 positions shown; findings below may reference images not displayed]

FINDINGS: Cardiovascular: There is no cardiomegaly or pericardial effusion.
The thoracic aorta is unremarkable. The origins of the great vessels
of the aortic arch appear patent as visualized. Evaluation of the
pulmonary arteries is limited due to respiratory motion artifact. No
pulmonary artery embolus identified.

Mediastinum/Nodes: There is no hilar or mediastinal adenopathy. The
esophagus and the thyroid gland are grossly unremarkable. No
mediastinal fluid collection.

Lungs/Pleura: The lungs are clear. There is no pleural effusion
pneumothorax. The central airways are patent.

Upper Abdomen: No acute abnormality.

Musculoskeletal: No acute osseous pathology. Small pocket of air in
the superficial soft tissues of the right breast likely related to
recent surgery.

Review of the MIP images confirms the above findings.
IMPRESSION: 1. No acute intrathoracic pathology. No CT evidence of pulmonary
embolism.
2. Small pocket of air in the superficial soft tissues of the right
breast likely related to recent surgery.

## 2020-07-05 MED ORDER — IOHEXOL 350 MG/ML SOLN
75.0000 mL | Freq: Once | INTRAVENOUS | Status: AC | PRN
  Administered 2020-07-05: 75 mL via INTRAVENOUS

## 2020-07-20 ENCOUNTER — Other Ambulatory Visit: Payer: Self-pay | Admitting: Physician Assistant

## 2020-07-20 DIAGNOSIS — I10 Essential (primary) hypertension: Secondary | ICD-10-CM | POA: Diagnosis not present

## 2020-07-20 DIAGNOSIS — F419 Anxiety disorder, unspecified: Secondary | ICD-10-CM | POA: Diagnosis not present

## 2020-07-20 DIAGNOSIS — E119 Type 2 diabetes mellitus without complications: Secondary | ICD-10-CM | POA: Diagnosis not present

## 2020-07-20 DIAGNOSIS — R0682 Tachypnea, not elsewhere classified: Secondary | ICD-10-CM | POA: Diagnosis not present

## 2020-07-20 DIAGNOSIS — K219 Gastro-esophageal reflux disease without esophagitis: Secondary | ICD-10-CM | POA: Diagnosis not present

## 2020-07-20 DIAGNOSIS — G4733 Obstructive sleep apnea (adult) (pediatric): Secondary | ICD-10-CM | POA: Diagnosis not present

## 2020-07-20 DIAGNOSIS — E782 Mixed hyperlipidemia: Secondary | ICD-10-CM | POA: Diagnosis not present

## 2020-07-20 DIAGNOSIS — G43009 Migraine without aura, not intractable, without status migrainosus: Secondary | ICD-10-CM

## 2020-07-20 DIAGNOSIS — R000 Tachycardia, unspecified: Secondary | ICD-10-CM | POA: Diagnosis not present

## 2020-07-26 ENCOUNTER — Other Ambulatory Visit: Payer: Self-pay | Admitting: Nurse Practitioner

## 2020-07-26 DIAGNOSIS — F3181 Bipolar II disorder: Secondary | ICD-10-CM | POA: Diagnosis not present

## 2020-07-26 DIAGNOSIS — F411 Generalized anxiety disorder: Secondary | ICD-10-CM | POA: Diagnosis not present

## 2020-07-27 ENCOUNTER — Telehealth: Payer: Self-pay | Admitting: Radiology

## 2020-07-27 ENCOUNTER — Encounter: Payer: Self-pay | Admitting: *Deleted

## 2020-07-27 ENCOUNTER — Ambulatory Visit (INDEPENDENT_AMBULATORY_CARE_PROVIDER_SITE_OTHER): Payer: 59 | Admitting: Cardiology

## 2020-07-27 ENCOUNTER — Encounter: Payer: Self-pay | Admitting: Cardiology

## 2020-07-27 ENCOUNTER — Other Ambulatory Visit: Payer: Self-pay

## 2020-07-27 VITALS — BP 124/70 | HR 93 | Ht 64.0 in | Wt 229.0 lb

## 2020-07-27 DIAGNOSIS — R002 Palpitations: Secondary | ICD-10-CM

## 2020-07-27 DIAGNOSIS — I479 Paroxysmal tachycardia, unspecified: Secondary | ICD-10-CM

## 2020-07-27 DIAGNOSIS — Z7189 Other specified counseling: Secondary | ICD-10-CM

## 2020-07-27 DIAGNOSIS — R0602 Shortness of breath: Secondary | ICD-10-CM | POA: Diagnosis not present

## 2020-07-27 NOTE — Patient Instructions (Addendum)
Medication Instructions:  Your Physician recommend you continue on your current medication as directed.    *If you need a refill on your cardiac medications before your next appointment, please call your pharmacy*   Lab Work: Your physician recommends that you return for lab work today (CBC, TSH, BMP).    Testing/Procedures: Your physician has requested that you have an echocardiogram. Echocardiography is a painless test that uses sound waves to create images of your heart. It provides your doctor with information about the size and shape of your heart and how well your heart's chambers and valves are working. This procedure takes approximately one hour. There are no restrictions for this procedure. [address]  Our physician has recommended that you wear an 7  DAY ZIO-PATCH monitor. The Zio patch cardiac monitor continuously records heart rhythm data for up to 14 days, this is for patients being evaluated for multiple types heart rhythms. For the first 24 hours post application, please avoid getting the Zio monitor wet in the shower or by excessive sweating during exercise. After that, feel free to carry on with regular activities. Keep soaps and lotions away from the ZIO XT Patch.   Someone from our office will call to verify address and mail monitor.    Follow-Up: At [facility], you and your health needs are our priority.  As part of our continuing mission to provide you with exceptional heart care, we have created designated Provider Care Teams.  These Care Teams include your primary Cardiologist (physician) and Advanced Practice Providers (APPs -  Physician Assistants and Nurse Practitioners) who all work together to provide you with the care you need, when you need it.  We recommend signing up for the patient portal called "MyChart".  Sign up information is provided on this After Visit Summary.  MyChart is used to connect with patients for Virtual Visits  (Telemedicine).  Patients are able to view lab/test results, encounter notes, upcoming appointments, etc.  Non-urgent messages can be sent to your provider as well.   To learn more about what you can do with MyChart, go to [email or web address].    Your next appointment:   1 month(s)  The format for your next appointment:   In Person or virtual  Provider:   Buford Dresser, MD  [place] Instructions   Your physician has requested you wear your ZIO patch monitor_______days.   This is a single patch monitor.  [employer] supplies one patch monitor per enrollment.  Additional stickers are not available.   Please do not apply patch if you will be having a Nuclear Stress Test, Echocardiogram, Cardiac CT, MRI, or Chest Xray during the time frame you would be wearing the monitor. The patch cannot be worn during these tests.  You cannot remove and re-apply the ZIO XT patch monitor.   Your ZIO patch monitor will be sent USPS Priority mail from [facility] directly to your home address. The monitor may also be mailed to a PO BOX if home delivery is not available.   It may take 3-5 days to receive your monitor after you have been enrolled.   Once you have received you monitor, please review enclosed instructions.  Your monitor has already been registered assigning a specific monitor serial # to you.   Applying the monitor   Shave hair from upper left chest.   Hold abrader disc by orange tab.  Rub abrader in 40 strokes over left upper chest as  indicated in your monitor instructions.   Clean area with 4 enclosed alcohol pads .  Use all pads to assure are is cleaned thoroughly.  Let dry.   Apply patch as indicated in monitor instructions.  Patch will be place under collarbone on left side of chest with arrow pointing upward.   Rub patch adhesive wings for 2 minutes.Remove white label marked "1".  Remove white label marked "2".  Rub patch adhesive wings for 2  additional minutes.   While looking in a mirror, press and release button in center of patch.  A small green light will flash 3-4 times .  This will be your only indicator the monitor has been turned on.     Do not shower for the first 24 hours.  You may shower after the first 24 hours.   Press button if you feel a symptom. You will hear a small click.  Record Date, Time and Symptom in the Patient Log Book.   When you are ready to remove patch, follow instructions on last 2 pages of Patient Log Book.  Stick patch monitor onto last page of Patient Log Book.   Place Patient Log Book in [place] box.  Use locking tab on box and tape box closed securely.  The [employer] and [employer] has [employer] on it.  Please place in mailbox as soon as possible.  Your physician should have your test results approximately 7 days after the monitor has been mailed back to [facility].   Call [place] at [phone] if you have questions regarding your ZIO XT patch monitor.  Call them immediately if you see an orange light blinking on your monitor.   If your monitor falls off in less than 4 days contact our Monitor department at [phone].  If your monitor becomes loose or falls off after 4 days call [employer] at [phone] for suggestions on securing your monitor.

## 2020-07-27 NOTE — Progress Notes (Signed)
Patient ID: Julie Hansen, female   DOB: [birth year], 49 y.o.   MRN: [id number] Patient enrolled for [employer] to ship a 7 day ZIO XT long term holter monitor to her home.

## 2020-07-27 NOTE — Telephone Encounter (Signed)
Left message for patient to call [id number] to schedule December headache f/u with Allie Dimmer

## 2020-07-27 NOTE — Progress Notes (Signed)
Cardiology Office Note:    Date:  07/27/2020   ID:  [name] Covington-Adams, DOB [birth year], MRN [id number]  PCP:  Heber Hope, MD  Cardiologist:  No primary care provider on file.  Referring MD: Rolm Bookbinder, MD   Chief Complaint  Patient presents with  . Shortness of Breath  . Palpitations    History of Present Illness:    Kalika Covington-Adams is a 49 y.o. female with a hx of headache, breast masses who is seen as a new consult at the request of Rolm Bookbinder, MD for the evaluation and management of shortness of breath and tachycardia/palpitations.  Note reviewed from 07/14/20 from Dr. Donne Hazel. Noted that about 5 days post excisional biopsy, she started having shortness of breath and tachycardia.  Had CT angio to exclude PE.   Tachycardia/palpitations: -Initial onset: had only had one event previously, woke up with a fast heart rate. Had ECG, bloodwork, told it was fine. Was not short of breath then.  -Frequency/Duration: happens about 4/7 days of the week, longest episode was over an hour, can be as short as 5 minutes. Has pulse ox at home, sometimes feels like it is racing but is only in the 90s, can range from 60-100s -Associated symptoms: feels like a pounding in her chest, flutter in her lower neck. Can wake her from her sleep. Feels like her heart is racing. Shortness of breath tends to be more related to exertion, feels that she breaths hard just walking around the store -Aggravating/alleviating factors: none that she has found -Syncope/near syncope: none. Rare dizziness -Prior cardiac history: filled up with fluid after her son was born. Had an echo at that time.  -Prior workup: had ETT done at [facility] 05/15/2019. I cannot see full result, but comment notes it as normal -Prior treatment: none -Caffeine: has stopped caffeine -Alcohol: rare -Tobacco: never -OTC supplements: no new -Comorbidities: BMI 39 -Exercise level: briefly on no lifting/no driving  restrictions. Now on normal duty. Most strenuous thing she has done in several weeks is walking from the parking lot to the soccer field. Has only had to climb rare stairs, felt very fatigued after -Labs: TSH, kidney function/electrolytes, CBC reviewed. -Cardiac ROS: no chest pain, no PND, no orthopnea, no LE edema. -Family history: cousin died of MI at age 77. Pat aunt had open heart surgery. Both parents have diabetes, high blood pressure, no heart issues.  Past Medical History:  Diagnosis Date  . Acid reflux   . Asthma   . Diabetes mellitus without complication ([place])   . Hyperlipidemia   . PCOS (polycystic ovarian syndrome)   . PONV (postoperative nausea and vomiting)   . Sleep apnea    uses cpap    Past Surgical History:  Procedure Laterality Date  . DILATION AND EVACUATION N/A 06/05/2017   Procedure: DILATATION AND EVACUATION;  Surgeon: Sanjuana Kava, MD;  Location: [facility];  Service: Gynecology;  Laterality: N/A;  . RADIOACTIVE SEED GUIDED EXCISIONAL BREAST BIOPSY Right 06/28/2020   Procedure: RIGHT RADIOACTIVE SEED GUIDED EXCISIONAL BREAST BIOPSY;  Surgeon: Rolm Bookbinder, MD;  Location: [facility];  Service: General;  Laterality: Right;  . WISDOM TOOTH EXTRACTION      Current Medications: Current Outpatient Medications on File Prior to Visit  Medication Sig  . albuterol (PROVENTIL) (2.5 MG/3ML) 0.083% nebulizer solution Take 2.5 mg by nebulization every 6 (six) hours as needed.  . ALPRAZolam (XANAX) 1 MG tablet Take 0.5 mg by mouth 2 (two) times daily as needed for anxiety.   Marland Kitchen  atorvastatin (LIPITOR) 40 MG tablet Take by mouth.  . baclofen (LIORESAL) 10 MG tablet Take 1 tablet (10 mg total) by mouth daily as needed.  . beclomethasone (QVAR) 40 MCG/ACT inhaler Inhale 2 puffs into the lungs 2 (two) times daily. prn  . dapagliflozin propanediol (FARXIGA) 10 MG TABS tablet Take 10 mg by mouth daily.  . Fremanezumab-vfrm (AJOVY) 225 MG/1.5ML SOAJ Inject 675 mg into  the skin every 3 (three) months.  Marland Kitchen ibuprofen (ADVIL,MOTRIN) 800 MG tablet Take 1 tablet (800 mg total) by mouth every 8 (eight) hours as needed.  . insulin lispro (HUMALOG) 100 UNIT/ML injection Inject 10 Units into the skin 2 (two) times daily before a meal.   . lisinopril (PRINIVIL,ZESTRIL) 5 MG tablet Take 5 mg by mouth daily.  . promethazine (PHENERGAN) 25 MG tablet TAKE 1 TABLET BY MOUTH EVERY 6 HOURS AS NEEDED FOR NAUSEA OR VOMITING.  . Semaglutide (OZEMPIC, 1 MG/DOSE, [place]) Inject into the skin.  Marland Kitchen traMADol (ULTRAM) 50 MG tablet Take 2 tablets (100 mg total) by mouth every 6 (six) hours as needed.  Marland Kitchen Ubrogepant (UBRELVY) 100 MG TABS Take 100 mg by mouth as needed.  . Vitamin D, Ergocalciferol, (DRISDOL) 1.25 MG (50000 UT) CAPS capsule   . zolpidem (AMBIEN) 10 MG tablet Take 10 mg by mouth at bedtime as needed. Reported on 10/17/2015   No current facility-administered medications on file prior to visit.     Allergies:   Macrodantin [nitrofurantoin macrocrystal] and Carbamazepine   Social History   Tobacco Use  . Smoking status: Never Smoker  . Smokeless tobacco: Never Used  Vaping Use  . Vaping Use: Never used  Substance Use Topics  . Alcohol use: Yes    Comment: occas wine  . Drug use: No    Family History: family history includes Breast cancer (age of onset: 55) in her mother; Breast cancer (age of onset: 50) in her maternal grandmother, paternal aunt, paternal aunt, and paternal aunt; Cancer in her father, maternal grandmother, and mother; Diabetes in her father and mother; Hypertension in her father and mother.  ROS:   Please see the history of present illness.  Additional pertinent ROS: Constitutional: Negative for chills, fever, night sweats, unintentional weight loss  HENT: Negative for ear pain and hearing loss.   Eyes: Negative for loss of vision and eye pain.  Respiratory: Negative for cough, sputum, wheezing.   Cardiovascular: See HPI. Gastrointestinal: Negative  for abdominal pain, melena, and hematochezia.  Genitourinary: Negative for dysuria and hematuria.  Musculoskeletal: Negative for falls and myalgias.  Skin: Negative for itching and rash.  Neurological: Negative for focal weakness, focal sensory changes and loss of consciousness.  Endo/Heme/Allergies: Does not bruise/bleed easily.     EKGs/Labs/Other Studies Reviewed:    The following studies were reviewed today: No prior CV procedures  EKG:  EKG is personally reviewed.  The ekg ordered today demonstrates NSR at 93 bpm  Recent Labs: 06/23/2020: BUN 11; Creatinine, Ser 0.87; Potassium 3.8; Sodium 137  Recent Lipid Panel    Component Value Date/Time   CHOL 235 (H) 12/23/2019 0832   TRIG 428 (H) 12/23/2019 0832   HDL 60 12/23/2019 0832   CHOLHDL 3.9 12/23/2019 0832   VLDL UNABLE TO CALCULATE IF TRIGLYCERIDE OVER 400 mg/dL 12/23/2019 0832   LDLCALC UNABLE TO CALCULATE IF TRIGLYCERIDE OVER 400 mg/dL 12/23/2019 0832   LDLDIRECT 121.5 (H) 12/23/2019 0832    Physical Exam:    VS:  BP 124/70   Pulse 93  Ht 5\' 4"  (1.626 m)   Wt 229 lb (103.9 kg)   LMP 06/28/2020 Comment: UPT negative 06/28/20  SpO2 99%   BMI 39.31 kg/m     Wt Readings from Last 3 Encounters:  07/27/20 229 lb (103.9 kg)  06/28/20 224 lb 3.3 oz (101.7 kg)  06/03/20 220 lb 6.4 oz (100 kg)    GEN: Well nourished, well developed in no acute distress HEENT: Normal, moist mucous membranes NECK: No JVD CARDIAC: regular rhythm, normal S1 and S2, no rubs or gallops. No murmurs. VASCULAR: Radial and DP pulses 2+ bilaterally. No carotid bruits RESPIRATORY:  Clear to auscultation without rales, wheezing or rhonchi  ABDOMEN: Soft, non-tender, non-distended MUSCULOSKELETAL:  Ambulates independently SKIN: Warm and dry, no edema NEUROLOGIC:  Alert and oriented x 3. No focal neuro deficits noted. PSYCHIATRIC:  Normal affect    ASSESSMENT:    1. Palpitation   2. Shortness of breath   3. Paroxysmal tachycardia (HCC)    4. Cardiac risk counseling   5. Counseling on health promotion and disease prevention    PLAN:    Tachycardia Palpitations Shortness of breath We spent significant time today reviewing different parts of the cardiovascular system (electrical, vascular, functional, and valvular). We discussed how each of these systems can present with different symptoms. We reviewed that there are different ways we evaluate these symptoms with tests. We reviewed which tests I think are most appropriate given the symptoms, and we discussed risks/benefits and limitations of each of these tests. Please see summary below. We also discussed that if testing is unrevealing for a cardiac cause of the symptoms, there are many noncardiac causes as well that can contribute to symptoms. If the heart is ruled out, then I recommend returning to PCP to discuss alternative diagnoses. -will proceed with echocardiogram and monitor for further evaluation  ADDENDUM: Echo without significant abnormalities, only grade 1 diastolic dysfunction. Monitor without significant arrhythmias, symptoms were sinus with rare ectopy.  Cardiac risk counseling and prevention recommendations: -recommend heart healthy/Mediterranean diet, with whole grains, fruits, vegetable, fish, lean meats, nuts, and olive oil. Limit salt. -recommend moderate walking, 3-5 times/week for 30-50 minutes each session. Aim for at least 150 minutes.week. Goal should be pace of 3 miles/hours, or walking 1.5 miles in 30 minutes -recommend avoidance of tobacco products. Avoid excess alcohol.  -ASCVD risk score: The 10-year ASCVD risk score Mikey Bussing DC Brooke Bonito., et al., 2013) is: 5.6%   Values used to calculate the score:     Age: 65 years     Sex: Female     Is Non-Hispanic African American: Yes     Diabetic: Yes     Tobacco smoker: No     Systolic Blood Pressure: 124 mmHg     Is BP treated: Yes     HDL Cholesterol: 60 mg/dL     Total Cholesterol: 235 mg/dL    Plan for  follow up: 1 mos or sooner based on results/symptoms  Buford Dresser, MD, PhD [place]  [facility]    Medication Adjustments/Labs and Tests Ordered: Current medicines are reviewed at length with the patient today.  Concerns regarding medicines are outlined above.  Orders Placed This Encounter  Procedures  . CBC  . TSH  . Basic metabolic panel  . LONG TERM MONITOR (3-14 DAYS)  . EKG 12-Lead   No orders of the defined types were placed in this encounter.   Patient Instructions  Medication Instructions:  Your Physician recommend you continue on your current  medication as directed.    *If you need a refill on your cardiac medications before your next appointment, please call your pharmacy*   Lab Work: Your physician recommends that you return for lab work today (CBC, TSH, BMP).    Testing/Procedures: Your physician has requested that you have an echocardiogram. Echocardiography is a painless test that uses sound waves to create images of your heart. It provides your doctor with information about the size and shape of your heart and how well your heart's chambers and valves are working. This procedure takes approximately one hour. There are no restrictions for this procedure. [place] [address]  Our physician has recommended that you wear an 7  DAY ZIO-PATCH monitor. The Zio patch cardiac monitor continuously records heart rhythm data for up to 14 days, this is for patients being evaluated for multiple types heart rhythms. For the first 24 hours post application, please avoid getting the Zio monitor wet in the shower or by excessive sweating during exercise. After that, feel free to carry on with regular activities. Keep soaps and lotions away from the ZIO XT Patch.   Someone from our office will call to verify address and mail monitor.    Follow-Up: At [facility], you and your health needs are our priority.  As part of our continuing mission to  provide you with exceptional heart care, we have created designated Provider Care Teams.  These Care Teams include your primary Cardiologist (physician) and Advanced Practice Providers (APPs -  Physician Assistants and Nurse Practitioners) who all work together to provide you with the care you need, when you need it.  We recommend signing up for the patient portal called "MyChart".  Sign up information is provided on this After Visit Summary.  MyChart is used to connect with patients for Virtual Visits (Telemedicine).  Patients are able to view lab/test results, encounter notes, upcoming appointments, etc.  Non-urgent messages can be sent to your provider as well.   To learn more about what you can do with MyChart, go to [email or web address].    Your next appointment:   1 month(s)  The format for your next appointment:   In Person or virtual  Provider:   Buford Dresser, MD  [place] Instructions   Your physician has requested you wear your ZIO patch monitor_______days.   This is a single patch monitor.  [employer] supplies one patch monitor per enrollment.  Additional stickers are not available.   Please do not apply patch if you will be having a Nuclear Stress Test, Echocardiogram, Cardiac CT, MRI, or Chest Xray during the time frame you would be wearing the monitor. The patch cannot be worn during these tests.  You cannot remove and re-apply the ZIO XT patch monitor.   Your ZIO patch monitor will be sent USPS Priority mail from [facility] directly to your home address. The monitor may also be mailed to a PO BOX if home delivery is not available.   It may take 3-5 days to receive your monitor after you have been enrolled.   Once you have received you monitor, please review enclosed instructions.  Your monitor has already been registered assigning a specific monitor serial # to you.   Applying the monitor   Shave hair from upper left chest.   Hold  abrader disc by orange tab.  Rub abrader in 40 strokes over left upper chest as indicated in your monitor instructions.   Clean area with  4 enclosed alcohol pads .  Use all pads to assure are is cleaned thoroughly.  Let dry.   Apply patch as indicated in monitor instructions.  Patch will be place under collarbone on left side of chest with arrow pointing upward.   Rub patch adhesive wings for 2 minutes.Remove white label marked "1".  Remove white label marked "2".  Rub patch adhesive wings for 2 additional minutes.   While looking in a mirror, press and release button in center of patch.  A small green light will flash 3-4 times .  This will be your only indicator the monitor has been turned on.     Do not shower for the first 24 hours.  You may shower after the first 24 hours.   Press button if you feel a symptom. You will hear a small click.  Record Date, Time and Symptom in the Patient Log Book.   When you are ready to remove patch, follow instructions on last 2 pages of Patient Log Book.  Stick patch monitor onto last page of Patient Log Book.   Place Patient Log Book in [place] box.  Use locking tab on box and tape box closed securely.  The [employer] has [employer] on it.  Please place in mailbox as soon as possible.  Your physician should have your test results approximately 7 days after the monitor has been mailed back to [facility].   Call [place] at [phone] if you have questions regarding your ZIO XT patch monitor.  Call them immediately if you see an orange light blinking on your monitor.   If your monitor falls off in less than 4 days contact our Monitor department at [phone].  If your monitor becomes loose or falls off after 4 days call [employer] at [phone] for suggestions on securing your monitor.      Signed, Buford Dresser, MD PhD 07/27/2020     [place]

## 2020-07-29 ENCOUNTER — Other Ambulatory Visit: Payer: Self-pay | Admitting: *Deleted

## 2020-07-29 ENCOUNTER — Other Ambulatory Visit: Payer: Self-pay | Admitting: Physician Assistant

## 2020-07-29 DIAGNOSIS — R002 Palpitations: Secondary | ICD-10-CM | POA: Diagnosis not present

## 2020-07-29 LAB — BASIC METABOLIC PANEL
BUN/Creatinine Ratio: 11 (ref 9–23)
BUN: 10 mg/dL (ref 6–24)
CO2: 21 mmol/L (ref 20–29)
Calcium: 9.4 mg/dL (ref 8.7–10.2)
Chloride: 102 mmol/L (ref 96–106)
Creatinine, Ser: 0.95 mg/dL (ref 0.57–1.00)
GFR calc Af Amer: 82 mL/min/{1.73_m2} (ref >59)
GFR calc non Af Amer: 72 mL/min/{1.73_m2} (ref >59)
Glucose: 128 mg/dL — ABNORMAL HIGH (ref 65–99)
Potassium: 4.2 mmol/L (ref 3.5–5.2)
Sodium: 137 mmol/L (ref 134–144)

## 2020-07-29 LAB — CBC
Hematocrit: 34.1 % (ref 34.0–46.6)
Hemoglobin: 10.9 g/dL — ABNORMAL LOW (ref 11.1–15.9)
MCH: 26.3 pg — ABNORMAL LOW (ref 26.6–33.0)
MCHC: 32 g/dL (ref 31.5–35.7)
MCV: 82 fL (ref 79–97)
Platelets: 431 10*3/uL (ref 150–450)
RBC: 4.14 x10E6/uL (ref 3.77–5.28)
RDW: 13.8 % (ref 11.7–15.4)
WBC: 5.7 10*3/uL (ref 3.4–10.8)

## 2020-07-29 LAB — TSH: TSH: 2.09 u[IU]/mL (ref 0.450–4.500)

## 2020-07-29 NOTE — Telephone Encounter (Signed)
erroneous

## 2020-07-31 ENCOUNTER — Other Ambulatory Visit: Payer: Self-pay | Admitting: Family Medicine

## 2020-08-01 ENCOUNTER — Other Ambulatory Visit: Payer: Self-pay | Admitting: Family Medicine

## 2020-08-01 ENCOUNTER — Other Ambulatory Visit: Payer: Self-pay | Admitting: Physician Assistant

## 2020-08-01 ENCOUNTER — Other Ambulatory Visit: Payer: 59

## 2020-08-05 ENCOUNTER — Other Ambulatory Visit: Payer: Self-pay | Admitting: Cardiology

## 2020-08-05 DIAGNOSIS — R000 Tachycardia, unspecified: Secondary | ICD-10-CM

## 2020-08-05 DIAGNOSIS — R0609 Other forms of dyspnea: Secondary | ICD-10-CM

## 2020-08-06 ENCOUNTER — Other Ambulatory Visit (INDEPENDENT_AMBULATORY_CARE_PROVIDER_SITE_OTHER): Payer: 59

## 2020-08-06 DIAGNOSIS — R002 Palpitations: Secondary | ICD-10-CM

## 2020-08-09 ENCOUNTER — Other Ambulatory Visit: Payer: 59

## 2020-08-10 ENCOUNTER — Other Ambulatory Visit: Payer: Self-pay

## 2020-08-10 ENCOUNTER — Ambulatory Visit (INDEPENDENT_AMBULATORY_CARE_PROVIDER_SITE_OTHER): Payer: 59

## 2020-08-10 DIAGNOSIS — R0600 Dyspnea, unspecified: Secondary | ICD-10-CM | POA: Diagnosis not present

## 2020-08-10 DIAGNOSIS — R000 Tachycardia, unspecified: Secondary | ICD-10-CM | POA: Diagnosis not present

## 2020-08-10 DIAGNOSIS — R0609 Other forms of dyspnea: Secondary | ICD-10-CM

## 2020-08-10 LAB — ECHOCARDIOGRAM COMPLETE
AR max vel: 2.93 cm2
AV Area VTI: 3.01 cm2
AV Area mean vel: 2.29 cm2
AV Mean grad: 5 mmHg
AV Peak grad: 9.2 mmHg
Ao pk vel: 1.52 m/s
Area-P 1/2: 3.89 cm2
Calc EF: 72.8 %
S' Lateral: 2.1 cm
Single Plane A2C EF: 72.8 %
Single Plane A4C EF: 71.9 %

## 2020-08-11 DIAGNOSIS — Z01419 Encounter for gynecological examination (general) (routine) without abnormal findings: Secondary | ICD-10-CM | POA: Diagnosis not present

## 2020-08-11 DIAGNOSIS — Z803 Family history of malignant neoplasm of breast: Secondary | ICD-10-CM | POA: Diagnosis not present

## 2020-08-11 DIAGNOSIS — E559 Vitamin D deficiency, unspecified: Secondary | ICD-10-CM | POA: Diagnosis not present

## 2020-08-11 DIAGNOSIS — I10 Essential (primary) hypertension: Secondary | ICD-10-CM | POA: Diagnosis not present

## 2020-08-11 DIAGNOSIS — E119 Type 2 diabetes mellitus without complications: Secondary | ICD-10-CM | POA: Diagnosis not present

## 2020-08-12 ENCOUNTER — Other Ambulatory Visit: Payer: Self-pay | Admitting: Physician Assistant

## 2020-08-15 DIAGNOSIS — G4733 Obstructive sleep apnea (adult) (pediatric): Secondary | ICD-10-CM | POA: Diagnosis not present

## 2020-08-17 ENCOUNTER — Ambulatory Visit: Payer: 59 | Admitting: Internal Medicine

## 2020-08-22 DIAGNOSIS — R002 Palpitations: Secondary | ICD-10-CM | POA: Diagnosis not present

## 2020-08-26 NOTE — Progress Notes (Signed)
I would continue the lisinopril. There is very mild stiffness (grade 1), which is very common, but lisinopril will help prevent that from progressing. The only treatment and prevention is through controlling the blood pressure.

## 2020-08-26 NOTE — Telephone Encounter (Signed)
Pt with 3 day intractable headache.  Multiple options discussed including Reyvow (presently unavailable in her pharmacy), Prednisone (may affect blood sugar), etc.  She will use phenergan plus benadryl and go to bed.  If available, she may utilize tramadol from her recent surgery.   She may pick up a sample of Reyvow to use in the future for rescue.  She is advised to not drive or operate heavy machinery for 8 hours post use of this medication.  Patient due for appointment and we can discuss further at that time.

## 2020-08-29 ENCOUNTER — Encounter: Payer: Self-pay | Admitting: *Deleted

## 2020-08-30 ENCOUNTER — Other Ambulatory Visit: Payer: Self-pay | Admitting: Physician Assistant

## 2020-08-30 MED ORDER — REYVOW 100 MG PO TABS: 200.0000 mg | ORAL_TABLET | ORAL | 1 refills | Status: DC | PRN

## 2020-08-30 NOTE — Telephone Encounter (Signed)
Pt notes headache returned.  She used Reyvow yesterday and had good success but through the day today it is returning.  She wants to know what she can take.   I have called in rx for Reyvow (as she was only given sample before).  She will use 200mg  this time in hopes of lasting efficacy.   If the headache should return, she is advised to call the office to see if she can get in for a Toradol injection.

## 2020-08-31 ENCOUNTER — Encounter: Payer: Self-pay | Admitting: Radiology

## 2020-09-02 ENCOUNTER — Other Ambulatory Visit: Payer: Self-pay | Admitting: Nurse Practitioner

## 2020-09-02 ENCOUNTER — Ambulatory Visit (INDEPENDENT_AMBULATORY_CARE_PROVIDER_SITE_OTHER): Payer: 59 | Admitting: *Deleted

## 2020-09-02 ENCOUNTER — Other Ambulatory Visit: Payer: Self-pay

## 2020-09-02 ENCOUNTER — Telehealth: Payer: Self-pay | Admitting: *Deleted

## 2020-09-02 ENCOUNTER — Other Ambulatory Visit: Payer: Self-pay | Admitting: *Deleted

## 2020-09-02 VITALS — BP 129/75 | HR 96

## 2020-09-02 DIAGNOSIS — G43009 Migraine without aura, not intractable, without status migrainosus: Secondary | ICD-10-CM

## 2020-09-02 DIAGNOSIS — G43901 Migraine, unspecified, not intractable, with status migrainosus: Secondary | ICD-10-CM

## 2020-09-02 MED ORDER — KETOROLAC TROMETHAMINE 30 MG/ML IJ SOLN
60.0000 mg | Freq: Once | INTRAMUSCULAR | Status: AC
  Administered 2020-09-02: 60 mg via INTRAMUSCULAR

## 2020-09-02 MED ORDER — TRAMADOL HCL 50 MG PO TABS
50.0000 mg | ORAL_TABLET | Freq: Four times a day (QID) | ORAL | 0 refills | Status: DC | PRN
Start: 2020-09-02 — End: 2020-09-02

## 2020-09-02 MED ORDER — CHLORPROMAZINE HCL 25 MG PO TABS: 25.0000 mg | ORAL_TABLET | ORAL | 0 refills | Status: DC | PRN

## 2020-09-02 NOTE — Progress Notes (Signed)
Patient was assessed and managed by nursing staff during this encounter. I have reviewed the chart and agree with the documentation and plan. I have also made any necessary editorial changes.  Verita Schneiders, MD 09/02/2020 10:33 AM

## 2020-09-02 NOTE — Progress Notes (Addendum)
Pt here to toradol injection for her headache.   Pt tolerated injection well.    Pt asked what she could take this weekend if the HA still persists. Told pt I would reach out to [facility] and call her back with recommendations.

## 2020-09-02 NOTE — Telephone Encounter (Signed)
Pt called stating HA is not better yet, and wanted to see if she can take tramadol, phenergan benadryl combo today. Spoke with Santiago Glad to check, ok for pt take that and she can not take the thorazine within 8 hours of phenergan. Will send in RX for tramadol. Pt verbalizes and understands.

## 2020-09-09 ENCOUNTER — Encounter: Payer: Self-pay | Admitting: Physician Assistant

## 2020-09-09 ENCOUNTER — Other Ambulatory Visit: Payer: Self-pay | Admitting: Physician Assistant

## 2020-09-09 ENCOUNTER — Other Ambulatory Visit: Payer: Self-pay

## 2020-09-09 ENCOUNTER — Ambulatory Visit: Payer: 59 | Admitting: Physician Assistant

## 2020-09-09 VITALS — BP 118/77 | HR 105

## 2020-09-09 DIAGNOSIS — G43901 Migraine, unspecified, not intractable, with status migrainosus: Secondary | ICD-10-CM | POA: Diagnosis not present

## 2020-09-09 MED ORDER — VENLAFAXINE HCL ER 37.5 MG PO CP24: 37.5000 mg | ORAL_CAPSULE | Freq: Every day | ORAL | 2 refills | Status: DC

## 2020-09-09 NOTE — Progress Notes (Signed)
History:  Julie Hansen is a 49 y.o. [id number] who presents to clinic today for headache after a period of terrible headache for 2 solid weeks (still ongoing but no longer terrible).  She has not yet tried Thorazine as it was not available right away in her pharmacy. Reyvow was not helpful. CPAP was recalled and she is awaiting a new one but she is still using the old one for now.  She waits to take her medication when she has an aura until it turns into a significant pain. She requests injections today as the headache lingers on. Has not started metoprolol and did not get from pharmacy   Past Medical History:  Diagnosis Date  . Acid reflux   . Asthma   . Diabetes mellitus without complication ([place])   . Hyperlipidemia   . PCOS (polycystic ovarian syndrome)   . PONV (postoperative nausea and vomiting)   . Sleep apnea    uses cpap    Social History   Socioeconomic History  . Marital status: Married    Spouse name: Not on file  . Number of children: Not on file  . Years of education: Not on file  . Highest education level: Not on file  Occupational History  . Not on file  Tobacco Use  . Smoking status: Never Smoker  . Smokeless tobacco: Never Used  Vaping Use  . Vaping Use: Never used  Substance and Sexual Activity  . Alcohol use: Yes    Comment: occas wine  . Drug use: No  . Sexual activity: Yes    Birth control/protection: None  Other Topics Concern  . Not on file  Social History Narrative  . Not on file   Social Determinants of Health   Financial Resource Strain: Not on file  Food Insecurity: Not on file  Transportation Needs: Not on file  Physical Activity: Not on file  Stress: Not on file  Social Connections: Not on file  Intimate Partner Violence: Not on file    Family History  Problem Relation Age of Onset  . Cancer Mother   . Hypertension Mother   . Diabetes Mother   . Breast cancer Mother 61  . Cancer Maternal Grandmother   . Breast cancer  Maternal Grandmother 47  . Cancer Father   . Diabetes Father   . Hypertension Father   . Breast cancer Paternal Aunt 43  . Breast cancer Paternal Aunt 37  . Breast cancer Paternal Aunt 71    Allergies  Allergen Reactions  . Macrodantin [Nitrofurantoin Macrocrystal] Itching  . Carbamazepine Other (See Comments)    Vaginal discharge    Current Outpatient Medications on File Prior to Visit  Medication Sig Dispense Refill  . albuterol (PROVENTIL) (2.5 MG/3ML) 0.083% nebulizer solution Take 2.5 mg by nebulization every 6 (six) hours as needed.    . ALPRAZolam (XANAX) 1 MG tablet Take 0.5 mg by mouth 2 (two) times daily as needed for anxiety.     Marland Kitchen atorvastatin (LIPITOR) 40 MG tablet Take by mouth.    . baclofen (LIORESAL) 10 MG tablet Take 1 tablet (10 mg total) by mouth daily as needed. 60 tablet 1  . beclomethasone (QVAR) 40 MCG/ACT inhaler Inhale 2 puffs into the lungs 2 (two) times daily. prn    . chlorproMAZINE (THORAZINE) 25 MG tablet Take 1 tablet (25 mg total) by mouth as needed. Take 1 tablet prn headache 20 tablet 0  . dapagliflozin propanediol (FARXIGA) 10 MG TABS tablet Take 10 mg by  mouth daily.    . Fremanezumab-vfrm (AJOVY) 225 MG/1.5ML SOAJ Inject 675 mg into the skin every 3 (three) months. 3 pen 3  . ibuprofen (ADVIL,MOTRIN) 800 MG tablet Take 1 tablet (800 mg total) by mouth every 8 (eight) hours as needed. 60 tablet 3  . insulin lispro (HUMALOG) 100 UNIT/ML injection Inject 10 Units into the skin 2 (two) times daily before a meal.     . Lasmiditan Succinate (REYVOW) 100 MG TABS Take 200 mg by mouth as needed (headache). 8 tablet 1  . lisinopril (PRINIVIL,ZESTRIL) 5 MG tablet Take 5 mg by mouth daily.    . promethazine (PHENERGAN) 25 MG tablet TAKE 1 TABLET BY MOUTH EVERY 6 HOURS AS NEEDED FOR NAUSEA OR VOMITING. 30 tablet 1  . Semaglutide (OZEMPIC, 1 MG/DOSE, [place]) Inject into the skin.    Marland Kitchen traMADol (ULTRAM) 50 MG tablet Take 2 tablets (100 mg total) by mouth every 6  (six) hours as needed. 6 tablet 1  . traMADol (ULTRAM) 50 MG tablet Take 1 tablet (50 mg total) by mouth every 6 (six) hours as needed for severe pain. 10 tablet 0  . UBRELVY 100 MG TABS TAKE 1 TABLET BY MOUTH AS NEEDED 10 tablet 3  . Vitamin D, Ergocalciferol, (DRISDOL) 1.25 MG (50000 UT) CAPS capsule     . zolpidem (AMBIEN) 10 MG tablet Take 10 mg by mouth at bedtime as needed. Reported on 10/17/2015     No current facility-administered medications on file prior to visit.     Review of Systems:  All pertinent positive/negative included in HPI, all other review of systems are negative   Objective:  Physical Exam BP 118/77   Pulse (!) 105  CONSTITUTIONAL: Well-developed, well-nourished female in no acute distress.  EYES: EOM intact ENT: Normocephalic CARDIOVASCULAR: Tachy RESPIRATORY: Normal rate.  MUSCULOSKELETAL: Normal ROM SKIN: Warm, dry without erythema  NEUROLOGICAL: Alert, oriented, CN II-XII grossly intact, Appropriate balancehavior, mood  PROCEDURE:  TRIGGER POINT INJECTIONS  Procedure: Mixture of 1%  Lidocaine, marcaine and dexamethazone in a ratio of 2:2:1  injected with 1cc each site in bilateral greater Occipital Nerves, bilateral lesser occipital nerves, bilateral cervical paraspinal muscles, bilateral trapezius.  Total amount injected: 15cc.  Pt tolerated procedure well.    Assessment & Plan:  Assessment: New diagnosis of status migranosus  Plan: ONB/TPI today for status migraine Effexor for prevention, 37.5mg  daily  D/c ajovy as it has not helped Can re-try [name]  as it helped before - just not this most recent terrible status migraine Will also retry Reyvow for rescue. Thorazine for rescue.  Can retry Reyvow for different HA.  Follow-up in 3 months or sooner PRN  Paticia Stack, PA-C 09/09/2020 9:21 AM

## 2020-09-18 DIAGNOSIS — Z20828 Contact with and (suspected) exposure to other viral communicable diseases: Secondary | ICD-10-CM | POA: Diagnosis not present

## 2020-09-18 DIAGNOSIS — J309 Allergic rhinitis, unspecified: Secondary | ICD-10-CM | POA: Diagnosis not present

## 2020-09-20 ENCOUNTER — Other Ambulatory Visit: Payer: Self-pay | Admitting: Internal Medicine

## 2020-09-20 ENCOUNTER — Other Ambulatory Visit: Payer: Self-pay

## 2020-09-20 ENCOUNTER — Emergency Department: Admission: EM | Admit: 2020-09-20 | Discharge: 2020-09-20 | Discharge status: Absent without leave | Payer: Self-pay

## 2020-09-20 ENCOUNTER — Emergency Department
Admission: RE | Admit: 2020-09-20 | Discharge: 2020-09-20 | Disposition: A | Discharge status: Home / Self Care | Payer: 59 | Source: Ambulatory Visit | Attending: Internal Medicine | Admitting: Internal Medicine

## 2020-09-20 VITALS — BP 132/82 | HR 93 | Temp 98.0°F | Resp 18

## 2020-09-20 DIAGNOSIS — J111 Influenza due to unidentified influenza virus with other respiratory manifestations: Secondary | ICD-10-CM | POA: Diagnosis not present

## 2020-09-20 DIAGNOSIS — R6889 Other general symptoms and signs: Secondary | ICD-10-CM

## 2020-09-20 MED ORDER — OSELTAMIVIR PHOSPHATE 75 MG PO CAPS: 75.0000 mg | ORAL_CAPSULE | Freq: Two times a day (BID) | ORAL | 0 refills | Status: DC

## 2020-09-20 MED ORDER — CETIRIZINE HCL 10 MG PO TABS: 10.0000 mg | ORAL_TABLET | Freq: Every day | ORAL | 0 refills | Status: DC

## 2020-09-20 MED ORDER — FLUTICASONE PROPIONATE 50 MCG/ACT NA SUSP: 1.0000 | Freq: Every day | NASAL | 0 refills | Status: DC

## 2020-09-20 MED FILL — FLUTICASONE PROP 50 MCG SPR: 50 | 60 days supply | Qty: 16 | Fill #0

## 2020-09-20 MED FILL — OSELTAMIVIR PHOSPHATE 75 MG: 75 | 5 days supply | Qty: 10 | Fill #0

## 2020-09-20 NOTE — ED Triage Notes (Signed)
Pt c/o scratchy throat, cough and nasal congestion since Saturday. Was seen at other urgent care on Sunday. Tested neg for covid and flu. OTC cough med prn. No known covid exposure. Has had covid vaccinations.

## 2020-09-20 NOTE — ED Provider Notes (Signed)
[name]    CSN: [id number] Arrival date & time: 09/20/20  [id number]      History   Chief Complaint Chief Complaint  Patient presents with   Scratchy throat   Nasal Congestion   Cough    HPI Julie Hansen is a 49 y.o. female.comes to urgent care complaining of increasing nasal congestion, nonproductive cough, generalized body aches and pressure-like sensation in the left ear.  Patient's son was diagnosed with influenza A at [facility] about a week ago.  The patient started having symptoms 3 days also after her son was diagnosed.  She was seen at an urgent care in [facility] and had a negative rapid flu and rapid Covid test.  No confirmatory PCR test was sent for either.  Patient was not given Tamiflu.  She denies any fever at this time.  Oral intake is decreased.  No dizziness or lightheadedness.   Continues to have some sore throat and "scratchy throat".    HPI  Past Medical History:  Diagnosis Date   Acid reflux    Asthma    Diabetes mellitus without complication ([place])    Hyperlipidemia    PCOS (polycystic ovarian syndrome)    PONV (postoperative nausea and vomiting)    Sleep apnea    uses cpap    Patient Active Problem List   Diagnosis Date Noted   Muscle spasm 06/20/2018   Unspecified dyspareunia (CODE) 03/20/2017   Amenorrhea 03/20/2017   Irritable bowel syndrome 01/01/2017   Status migrainosus 01/01/2017   Anxiety 01/01/2017   Vaginal discharge 12/17/2016   GERD (gastroesophageal reflux disease) 10/30/2016   Mixed hyperlipidemia 10/30/2016   Diabetes type 2, controlled ([place]) 09/12/2015   OSA (obstructive sleep apnea) 02/16/2015   Asthma 10/29/2011    Past Surgical History:  Procedure Laterality Date   DILATION AND EVACUATION N/A 06/05/2017   Procedure: DILATATION AND EVACUATION;  Surgeon: Sanjuana Kava, MD;  Location: [facility];  Service: Gynecology;  Laterality: N/A;   RADIOACTIVE SEED GUIDED  EXCISIONAL BREAST BIOPSY Right 06/28/2020   Procedure: RIGHT RADIOACTIVE SEED GUIDED EXCISIONAL BREAST BIOPSY;  Surgeon: Rolm Bookbinder, MD;  Location: [facility];  Service: General;  Laterality: Right;   WISDOM TOOTH EXTRACTION      OB History    Gravida  2   Para  2   Term  2   Preterm      AB      Living  2     SAB      IAB      Ectopic      Multiple      Live Births               Home Medications    Prior to Admission medications   Medication Sig Start Date End Date Taking? Authorizing Provider  cetirizine (ZYRTEC ALLERGY) 10 MG tablet Take 1 tablet (10 mg total) by mouth daily. 09/20/20  Yes Nahun Kronberg, Myrene Galas, MD  fluticasone (FLONASE) 50 MCG/ACT nasal spray Place 1 spray into both nostrils daily. 09/20/20  Yes Preeya Cleckley, Myrene Galas, MD  oseltamivir (TAMIFLU) 75 MG capsule Take 1 capsule (75 mg total) by mouth every 12 (twelve) hours. 09/20/20  Yes Trelon Plush, Myrene Galas, MD  albuterol (PROVENTIL) (2.5 MG/3ML) 0.083% nebulizer solution Take 2.5 mg by nebulization every 6 (six) hours as needed.    [provider]  ALPRAZolam Duanne Moron) 1 MG tablet Take 0.5 mg by mouth 2 (two) times daily as needed for  anxiety.     [provider]  atorvastatin (LIPITOR) 40 MG tablet Take by mouth. 04/11/20   [provider]  baclofen (LIORESAL) 10 MG tablet Take 1 tablet (10 mg total) by mouth daily as needed. 02/26/20   Jaclyn Prime, Collene Leyden, PA-C  beclomethasone (QVAR) 40 MCG/ACT inhaler Inhale 2 puffs into the lungs 2 (two) times daily. prn    [provider]  chlorproMAZINE (THORAZINE) 25 MG tablet Take 1 tablet (25 mg total) by mouth as needed. Take 1 tablet prn headache 09/02/20   Jaclyn Prime, Collene Leyden, PA-C  dapagliflozin propanediol (FARXIGA) 10 MG TABS tablet Take 10 mg by mouth daily.    [provider]  ibuprofen (ADVIL,MOTRIN) 800 MG tablet Take 1 tablet (800 mg total) by mouth every 8 (eight) hours as needed. 06/05/17    Sanjuana Kava, MD  insulin lispro (HUMALOG) 100 UNIT/ML injection Inject 10 Units into the skin 2 (two) times daily before a meal.     [provider]  Lasmiditan Succinate (REYVOW) 100 MG TABS Take 200 mg by mouth as needed (headache). 08/30/20   Jaclyn Prime, Collene Leyden, PA-C  lisinopril (PRINIVIL,ZESTRIL) 5 MG tablet Take 5 mg by mouth daily.    [provider]  promethazine (PHENERGAN) 25 MG tablet TAKE 1 TABLET BY MOUTH EVERY 6 HOURS AS NEEDED FOR NAUSEA OR VOMITING. 07/29/20   Jaclyn Prime, Collene Leyden, PA-C  Semaglutide (OZEMPIC, 1 MG/DOSE, [place]) Inject into the skin.    [provider]  traMADol (ULTRAM) 50 MG tablet Take 2 tablets (100 mg total) by mouth every 6 (six) hours as needed. 06/28/20   Rolm Bookbinder, MD  traMADol (ULTRAM) 50 MG tablet Take 1 tablet (50 mg total) by mouth every 6 (six) hours as needed for severe pain. 09/02/20   Ashley Murrain, NP  UBRELVY 100 MG TABS TAKE 1 TABLET BY MOUTH AS NEEDED 08/12/20   Jaclyn Prime, Collene Leyden, PA-C  venlafaxine XR (EFFEXOR XR) 37.5 MG 24 hr capsule Take 1 capsule (37.5 mg total) by mouth daily with breakfast. 09/09/20   Jaclyn Prime, Collene Leyden, PA-C  Vitamin D, Ergocalciferol, (DRISDOL) 1.25 MG (50000 UT) CAPS capsule  05/14/19   [provider]  zolpidem (AMBIEN) 10 MG tablet Take 10 mg by mouth at bedtime as needed. Reported on 10/17/2015    [provider]    Family History Family History  Problem Relation Age of Onset   Cancer Mother    Hypertension Mother    Diabetes Mother    Breast cancer Mother 107   Cancer Maternal Grandmother    Breast cancer Maternal Grandmother 26   Cancer Father    Diabetes Father    Hypertension Father    Breast cancer Paternal Aunt 69   Breast cancer Paternal Aunt 10   Breast cancer Paternal Aunt 58    Social History Social History   Tobacco Use   Smoking status: Never Smoker   Smokeless tobacco: Never Used  Scientific laboratory technician Use: Never used   Substance Use Topics   Alcohol use: Yes    Comment: occas wine   Drug use: No     Allergies   Macrodantin [nitrofurantoin macrocrystal] and Carbamazepine   Review of Systems Review of Systems  Constitutional: Positive for activity change and fatigue. Negative for appetite change, chills and fever.  HENT: Positive for congestion and rhinorrhea. Negative for sinus pressure, sinus pain and sneezing.   Respiratory: Positive for cough. Negative for  shortness of breath.   Gastrointestinal: Positive for diarrhea. Negative for nausea and vomiting.  Musculoskeletal: Positive for myalgias.  Skin: Negative.   Neurological: Positive for headaches. Negative for dizziness and light-headedness.     Physical Exam Triage Vital Signs ED Triage Vitals  Enc Vitals Group     BP 09/20/20 0838 132/82     Pulse Rate 09/20/20 0838 93     Resp 09/20/20 0838 18     Temp 09/20/20 0838 98 F (36.7 C)     Temp Source 09/20/20 0838 Oral     SpO2 09/20/20 0838 99 %     Weight --      Height --      Head Circumference --      Peak Flow --      Pain Score 09/20/20 0839 0     Pain Loc --      Pain Edu? --      Excl. in [place]? --    No data found.  Updated Vital Signs BP 132/82 (BP Location: Left Arm)    Pulse 93    Temp 98 F (36.7 C) (Oral)    Resp 18    LMP  (LMP Unknown)    SpO2 99%   Visual Acuity Right Eye Distance:   Left Eye Distance:   Bilateral Distance:    Right Eye Near:   Left Eye Near:    Bilateral Near:     Physical Exam Vitals and nursing note reviewed.  Constitutional:      Appearance: She is ill-appearing.  HENT:     Right Ear: Tympanic membrane normal.     Ears:     Comments: Middle ear effusion in the left ear.  Tympanic membrane is without erythema    Mouth/Throat:     Mouth: Mucous membranes are moist.     Pharynx: No posterior oropharyngeal erythema.  Cardiovascular:     Rate and Rhythm: Normal rate and regular rhythm.     Pulses: Normal pulses.     Heart  sounds: Normal heart sounds.  Pulmonary:     Effort: Pulmonary effort is normal.     Breath sounds: Normal breath sounds.  Abdominal:     General: Abdomen is flat.  Neurological:     Mental Status: She is alert.      UC Treatments / Results  Labs (all labs ordered are listed, but only abnormal results are displayed) Labs Reviewed  COVID-19, FLU A+B NAA    EKG   Radiology No results found.  Procedures Procedures (including critical care time)  Medications Ordered in UC Medications - No data to display  Initial Impression / Assessment and Plan / UC Course  I have reviewed the triage vital signs and the nursing notes.  Pertinent labs & imaging results that were available during my care of the patient were reviewed by me and considered in my medical decision making (see chart for details).    1.  Flulike symptoms in the setting of exposure to influenza A infection: COVID-19, flu A+ B PCR test sent Tamiflu 75 mg twice daily for 5 days Flonase Mucinex use Saline nasal spray We will call you with further recommendations if labs are abnormal If you have worsening shortness of breath, fever, persistent vomiting please return to urgent care to be reevaluated. Final Clinical Impressions(s) / UC Diagnoses   Final diagnoses:  Flu-like symptoms     Discharge Instructions     Saline nasal spray will be helpful Increase  oral fluid intake Take medications as directed If symptoms persist or worsen please return to urgent care to be evaluated We will call you with recommendations if lab results are abnormal.   ED Prescriptions    Medication Sig Dispense Auth. Provider   oseltamivir (TAMIFLU) 75 MG capsule Take 1 capsule (75 mg total) by mouth every 12 (twelve) hours. 10 capsule Arali Somera, Britta Mccreedy, MD   fluticasone (FLONASE) 50 MCG/ACT nasal spray Place 1 spray into both nostrils daily. 16 g Merrilee Jansky, MD   cetirizine (ZYRTEC ALLERGY) 10 MG tablet Take 1 tablet (10  mg total) by mouth daily. 30 tablet Augustine Brannick, Britta Mccreedy, MD     PDMP not reviewed this encounter.   Merrilee Jansky, MD 09/20/20 1100

## 2020-09-20 NOTE — Discharge Instructions (Addendum)
Saline nasal spray will be helpful Increase oral fluid intake Take medications as directed If symptoms persist or worsen please return to urgent care to be evaluated We will call you with recommendations if lab results are abnormal.

## 2020-09-22 LAB — COVID-19, FLU A+B NAA
Influenza A, NAA: NOT DETECTED
Influenza B, NAA: NOT DETECTED
SARS-CoV-2, NAA: NOT DETECTED

## 2020-09-26 ENCOUNTER — Encounter: Payer: Self-pay | Admitting: Cardiology

## 2020-09-26 ENCOUNTER — Telehealth: Payer: Self-pay | Admitting: Cardiology

## 2020-09-26 NOTE — Telephone Encounter (Signed)
Spoke with patient and gave her monitor results  1) She was asking about her additional testing that Dr Cristal Deer discussed 2) Does she need follow up  3) Should she continue the Lisinopril since she has been or for 5-6 years or change to something else given results of Echo?   Will forward to Dr Cristal Deer for review

## 2020-09-26 NOTE — Telephone Encounter (Signed)
Patient returning call for monitor results. 

## 2020-09-27 ENCOUNTER — Other Ambulatory Visit: Payer: Self-pay | Admitting: Endocrinology

## 2020-09-27 DIAGNOSIS — E1165 Type 2 diabetes mellitus with hyperglycemia: Secondary | ICD-10-CM | POA: Diagnosis not present

## 2020-09-27 DIAGNOSIS — E7800 Pure hypercholesterolemia, unspecified: Secondary | ICD-10-CM | POA: Diagnosis not present

## 2020-09-27 DIAGNOSIS — I10 Essential (primary) hypertension: Secondary | ICD-10-CM | POA: Diagnosis not present

## 2020-09-27 DIAGNOSIS — G43909 Migraine, unspecified, not intractable, without status migrainosus: Secondary | ICD-10-CM | POA: Diagnosis not present

## 2020-09-27 DIAGNOSIS — G4733 Obstructive sleep apnea (adult) (pediatric): Secondary | ICD-10-CM | POA: Diagnosis not present

## 2020-09-27 NOTE — Telephone Encounter (Signed)
Per ECHO results pt made aware MD recommended continuing lisinopril. Pt also informed MD recommended a 1 month follow up appointment based on last OV on 07/27/20. Pt verbalized understanding.   Appointment scheduled for 10/06/20 at 8:40 am.

## 2020-09-28 DIAGNOSIS — G4733 Obstructive sleep apnea (adult) (pediatric): Secondary | ICD-10-CM | POA: Diagnosis not present

## 2020-10-06 ENCOUNTER — Other Ambulatory Visit: Payer: Self-pay | Admitting: Physician Assistant

## 2020-10-06 ENCOUNTER — Ambulatory Visit: Payer: 59 | Admitting: Cardiology

## 2020-10-06 DIAGNOSIS — G43009 Migraine without aura, not intractable, without status migrainosus: Secondary | ICD-10-CM

## 2020-10-13 ENCOUNTER — Other Ambulatory Visit: Payer: Self-pay | Admitting: Endocrinology

## 2020-10-18 ENCOUNTER — Other Ambulatory Visit: Payer: Self-pay | Admitting: General Surgery

## 2020-10-18 DIAGNOSIS — Z0000 Encounter for general adult medical examination without abnormal findings: Secondary | ICD-10-CM

## 2020-10-21 DIAGNOSIS — G4733 Obstructive sleep apnea (adult) (pediatric): Secondary | ICD-10-CM | POA: Diagnosis not present

## 2020-10-26 ENCOUNTER — Ambulatory Visit: Payer: 59 | Admitting: Cardiology

## 2020-10-28 ENCOUNTER — Ambulatory Visit: Payer: 59

## 2020-11-07 ENCOUNTER — Other Ambulatory Visit: Payer: Self-pay | Admitting: Physician Assistant

## 2020-11-08 DIAGNOSIS — F419 Anxiety disorder, unspecified: Secondary | ICD-10-CM | POA: Diagnosis not present

## 2020-11-08 DIAGNOSIS — K581 Irritable bowel syndrome with constipation: Secondary | ICD-10-CM | POA: Diagnosis not present

## 2020-11-08 DIAGNOSIS — K219 Gastro-esophageal reflux disease without esophagitis: Secondary | ICD-10-CM | POA: Diagnosis not present

## 2020-11-08 DIAGNOSIS — E782 Mixed hyperlipidemia: Secondary | ICD-10-CM | POA: Diagnosis not present

## 2020-11-08 DIAGNOSIS — Z794 Long term (current) use of insulin: Secondary | ICD-10-CM | POA: Diagnosis not present

## 2020-11-08 DIAGNOSIS — G4733 Obstructive sleep apnea (adult) (pediatric): Secondary | ICD-10-CM | POA: Diagnosis not present

## 2020-11-08 DIAGNOSIS — G43109 Migraine with aura, not intractable, without status migrainosus: Secondary | ICD-10-CM | POA: Diagnosis not present

## 2020-11-08 DIAGNOSIS — E119 Type 2 diabetes mellitus without complications: Secondary | ICD-10-CM | POA: Diagnosis not present

## 2020-11-08 DIAGNOSIS — I10 Essential (primary) hypertension: Secondary | ICD-10-CM | POA: Diagnosis not present

## 2020-11-09 ENCOUNTER — Other Ambulatory Visit: Payer: Self-pay | Admitting: Family Medicine

## 2020-11-14 DIAGNOSIS — G4733 Obstructive sleep apnea (adult) (pediatric): Secondary | ICD-10-CM | POA: Diagnosis not present

## 2020-11-17 ENCOUNTER — Other Ambulatory Visit: Payer: Self-pay | Admitting: Physician Assistant

## 2020-11-18 ENCOUNTER — Other Ambulatory Visit: Payer: Self-pay | Admitting: Physician Assistant

## 2020-11-23 ENCOUNTER — Other Ambulatory Visit: Payer: Self-pay | Admitting: General Surgery

## 2020-11-23 DIAGNOSIS — Z0000 Encounter for general adult medical examination without abnormal findings: Secondary | ICD-10-CM

## 2020-11-24 ENCOUNTER — Other Ambulatory Visit: Payer: Self-pay | Admitting: Nurse Practitioner

## 2020-11-24 DIAGNOSIS — F3181 Bipolar II disorder: Secondary | ICD-10-CM | POA: Diagnosis not present

## 2020-11-24 DIAGNOSIS — F411 Generalized anxiety disorder: Secondary | ICD-10-CM | POA: Diagnosis not present

## 2020-12-02 ENCOUNTER — Ambulatory Visit: Payer: 59 | Admitting: Physician Assistant

## 2020-12-02 ENCOUNTER — Other Ambulatory Visit: Payer: Self-pay

## 2020-12-02 ENCOUNTER — Other Ambulatory Visit: Payer: Self-pay | Admitting: Physician Assistant

## 2020-12-02 ENCOUNTER — Encounter: Payer: Self-pay | Admitting: Physician Assistant

## 2020-12-02 VITALS — BP 127/75 | HR 111

## 2020-12-02 DIAGNOSIS — G43709 Chronic migraine without aura, not intractable, without status migrainosus: Secondary | ICD-10-CM | POA: Diagnosis not present

## 2020-12-02 DIAGNOSIS — G43009 Migraine without aura, not intractable, without status migrainosus: Secondary | ICD-10-CM

## 2020-12-02 DIAGNOSIS — M62838 Other muscle spasm: Secondary | ICD-10-CM

## 2020-12-02 MED ORDER — PROMETHAZINE HCL 25 MG PO TABS: 25.0000 mg | ORAL_TABLET | Freq: Four times a day (QID) | ORAL | 1 refills | Status: DC | PRN

## 2020-12-02 MED ORDER — UBRELVY 100 MG PO TABS: 1.0000 | ORAL_TABLET | ORAL | 11 refills | Status: DC | PRN

## 2020-12-02 MED ORDER — CHLORPROMAZINE HCL 25 MG PO TABS: 25.0000 mg | ORAL_TABLET | ORAL | 2 refills | Status: DC | PRN

## 2020-12-02 MED ORDER — GABAPENTIN 100 MG PO CAPS: 300.0000 mg | ORAL_CAPSULE | Freq: Three times a day (TID) | ORAL | 3 refills | Status: DC

## 2020-12-02 MED ORDER — BACLOFEN 10 MG PO TABS: 10.0000 mg | ORAL_TABLET | Freq: Every day | ORAL | 5 refills | Status: DC | PRN

## 2020-12-02 MED ORDER — REYVOW 100 MG PO TABS: ORAL_TABLET | ORAL | 5 refills | Status: DC

## 2020-12-02 NOTE — Progress Notes (Signed)
History:  Julie Hansen is a 51 y.o. [id number] who presents to clinic today for headache eval.  She has had 10-15 headaches in the last month.  She takes [place] at work and [employer] or thorazine when she gets home.  They are still severe/debilitating.  She has to go to bed with them.  They may be less frequent than before. She also uses Ibuprofen, aleve and/or tylenol.   The injections worked really well during that day but at night the muscle tension was horrendous.  The following day was no pain and it lasted a few days.   Effexor has caused her to feel more flat and have sexual side effects - no orgasms and decreased drive.  Other preventive meds failed include Topamax, Ajovy, Emgality, Can't do beta blockers due to asthma Nurtec - no help, Imitrex, Maxalt    Past Medical History:  Diagnosis Date  . Acid reflux   . Asthma   . Diabetes mellitus without complication ([place])   . Hyperlipidemia   . PCOS (polycystic ovarian syndrome)   . PONV (postoperative nausea and vomiting)   . Sleep apnea    uses cpap    Social History   Socioeconomic History  . Marital status: Married    Spouse name: Not on file  . Number of children: Not on file  . Years of education: Not on file  . Highest education level: Not on file  Occupational History  . Not on file  Tobacco Use  . Smoking status: Never Smoker  . Smokeless tobacco: Never Used  Vaping Use  . Vaping Use: Never used  Substance and Sexual Activity  . Alcohol use: Yes    Comment: occas wine  . Drug use: No  . Sexual activity: Yes    Birth control/protection: None  Other Topics Concern  . Not on file  Social History Narrative  . Not on file   Social Determinants of Health   Financial Resource Strain: Not on file  Food Insecurity: Not on file  Transportation Needs: Not on file  Physical Activity: Not on file  Stress: Not on file  Social Connections: Not on file  Intimate Partner Violence: Not on file    Family History   Problem Relation Age of Onset  . Cancer Mother   . Hypertension Mother   . Diabetes Mother   . Breast cancer Mother 43  . Cancer Maternal Grandmother   . Breast cancer Maternal Grandmother 52  . Cancer Father   . Diabetes Father   . Hypertension Father   . Breast cancer Paternal Aunt 41  . Breast cancer Paternal Aunt 39  . Breast cancer Paternal Aunt 35    Allergies  Allergen Reactions  . Macrodantin [Nitrofurantoin Macrocrystal] Itching  . Carbamazepine Other (See Comments)    Vaginal discharge    Current Outpatient Medications on File Prior to Visit  Medication Sig Dispense Refill  . albuterol (PROVENTIL) (2.5 MG/3ML) 0.083% nebulizer solution Take 2.5 mg by nebulization every 6 (six) hours as needed.    . ALPRAZolam (XANAX) 1 MG tablet Take 0.5 mg by mouth 2 (two) times daily as needed for anxiety.     Marland Kitchen atorvastatin (LIPITOR) 40 MG tablet Take by mouth.    . baclofen (LIORESAL) 10 MG tablet TAKE 1 TABLET BY MOUTH DAILY AS NEEDED. 30 tablet 2  . beclomethasone (QVAR) 40 MCG/ACT inhaler Inhale 2 puffs into the lungs 2 (two) times daily. prn    . cetirizine (ZYRTEC ALLERGY) 10 MG  tablet Take 1 tablet (10 mg total) by mouth daily. 30 tablet 0  . chlorproMAZINE (THORAZINE) 25 MG tablet Take 1 tablet (25 mg total) by mouth as needed. Take 1 tablet prn headache 20 tablet 0  . dapagliflozin propanediol (FARXIGA) 10 MG TABS tablet Take 10 mg by mouth daily.    . fluticasone (FLONASE) 50 MCG/ACT nasal spray Place 1 spray into both nostrils daily. 16 g 0  . ibuprofen (ADVIL,MOTRIN) 800 MG tablet Take 1 tablet (800 mg total) by mouth every 8 (eight) hours as needed. 60 tablet 3  . insulin lispro (HUMALOG) 100 UNIT/ML injection Inject 10 Units into the skin 2 (two) times daily before a meal.     . lisinopril (PRINIVIL,ZESTRIL) 5 MG tablet Take 5 mg by mouth daily.    Marland Kitchen oseltamivir (TAMIFLU) 75 MG capsule Take 1 capsule (75 mg total) by mouth every 12 (twelve) hours. 10 capsule 0  .  promethazine (PHENERGAN) 25 MG tablet TAKE 1 TABLET BY MOUTH EVERY 6 HOURS AS NEEDED FOR NAUSEA OR VOMITING. 30 tablet 1  . REYVOW 100 MG TABS TAKE 2 TABLETS BY MOUTH AS NEEDED FOR HEADACHE 8 tablet 1  . Semaglutide (OZEMPIC, 1 MG/DOSE, [place]) Inject into the skin.    Marland Kitchen traMADol (ULTRAM) 50 MG tablet Take 2 tablets (100 mg total) by mouth every 6 (six) hours as needed. 6 tablet 1  . traMADol (ULTRAM) 50 MG tablet Take 1 tablet (50 mg total) by mouth every 6 (six) hours as needed for severe pain. 10 tablet 0  . UBRELVY 100 MG TABS TAKE 1 TABLET BY MOUTH AS NEEDED 10 tablet 3  . venlafaxine XR (EFFEXOR XR) 37.5 MG 24 hr capsule Take 1 capsule (37.5 mg total) by mouth daily with breakfast. 30 capsule 2  . Vitamin D, Ergocalciferol, (DRISDOL) 1.25 MG (50000 UT) CAPS capsule     . zolpidem (AMBIEN) 10 MG tablet Take 10 mg by mouth at bedtime as needed. Reported on 10/17/2015     No current facility-administered medications on file prior to visit.     Review of Systems:  All pertinent positive/negative included in HPI, all other review of systems are negative   Objective:  Physical Exam BP 127/75   Pulse (!) 111  CONSTITUTIONAL: Well-developed, well-nourished female in no acute distress.  EYES: EOM intact ENT: Normocephalic CARDIOVASCULAR: Regular rate RESPIRATORY: Normal rate. MUSCULOSKELETAL: Normal ROM SKIN: Warm, dry without erythema  NEUROLOGICAL: Alert, oriented, CN II-XII grossly intact, Appropriate balance PSYCH: Normal behavior, mood   Assessment & Plan:  Assessment: New diagnosis of chronic migraine.  Worsening of migraine/muscle spasm  Plan: Begin Neurontin for prevention up to 900mg  daily.  Titrate as tolerated to 3 tabs up to TID.  Sedation precautions with this medication discussed.  She will begin to use in the evening and add am doses only when ont working or confident that it will not cause an issue.   Taper down and off Effexor 37.5mg  by using every other day OR okay  to open capsule and discard some of the contents.   Other meds as previous.  Pt reminded not to use Thorazine and Promethazine within 8 hours of each other.  Pt reminded no operating heavy machinery with Thorazine/promethazine or relpax.   Follow-up in 3 months or sooner PRN  Paticia Stack, PA-C 12/02/2020 8:44 AM

## 2020-12-05 ENCOUNTER — Encounter: Payer: Self-pay | Admitting: *Deleted

## 2020-12-05 ENCOUNTER — Other Ambulatory Visit: Payer: Self-pay | Admitting: Physician Assistant

## 2020-12-05 DIAGNOSIS — M722 Plantar fascial fibromatosis: Secondary | ICD-10-CM | POA: Diagnosis not present

## 2020-12-07 ENCOUNTER — Other Ambulatory Visit: Payer: Self-pay | Admitting: Endocrinology

## 2020-12-08 ENCOUNTER — Other Ambulatory Visit: Payer: Self-pay | Admitting: Endocrinology

## 2020-12-21 ENCOUNTER — Other Ambulatory Visit: Payer: Self-pay | Admitting: Physician Assistant

## 2020-12-21 DIAGNOSIS — M722 Plantar fascial fibromatosis: Secondary | ICD-10-CM | POA: Diagnosis not present

## 2020-12-25 ENCOUNTER — Other Ambulatory Visit: Payer: Self-pay

## 2020-12-30 ENCOUNTER — Other Ambulatory Visit: Payer: Self-pay

## 2020-12-30 DIAGNOSIS — M722 Plantar fascial fibromatosis: Secondary | ICD-10-CM | POA: Diagnosis not present

## 2020-12-30 MED ORDER — MELOXICAM 15 MG PO TABS
ORAL_TABLET | ORAL | 0 refills | Status: DC
  Filled 2020-12-30: qty 30, 30d supply, fill #0

## 2021-01-02 ENCOUNTER — Other Ambulatory Visit: Payer: Self-pay

## 2021-01-02 MED ORDER — FARXIGA 10 MG PO TABS
ORAL_TABLET | ORAL | 3 refills | Status: DC
  Filled 2021-01-02: qty 30, 30d supply, fill #0
  Filled 2021-01-04: qty 90, 90d supply, fill #0
  Filled 2021-05-16: qty 90, 90d supply, fill #1

## 2021-01-02 MED ORDER — LISINOPRIL 5 MG PO TABS
ORAL_TABLET | ORAL | 5 refills | Status: DC
  Filled 2021-01-02: qty 90, 90d supply, fill #0

## 2021-01-02 MED ORDER — OZEMPIC (1 MG/DOSE) 4 MG/3ML ~~LOC~~ SOPN
PEN_INJECTOR | SUBCUTANEOUS | 3 refills | Status: DC
  Filled 2021-01-02: qty 3, 28d supply, fill #0
  Filled 2021-01-03: qty 9, 84d supply, fill #0
  Filled 2021-03-16: qty 3, 28d supply, fill #1

## 2021-01-02 MED FILL — Gabapentin Cap 100 MG: ORAL | 10 days supply | Qty: 90 | Fill #0 | Status: AC

## 2021-01-02 MED FILL — Gabapentin Cap 100 MG: ORAL | 90 days supply | Qty: 90 | Fill #0 | Status: CN

## 2021-01-02 MED FILL — Fenofibrate Tab 48 MG: ORAL | 30 days supply | Qty: 30 | Fill #0 | Status: AC

## 2021-01-03 ENCOUNTER — Other Ambulatory Visit: Payer: Self-pay

## 2021-01-04 ENCOUNTER — Other Ambulatory Visit: Payer: Self-pay

## 2021-01-05 ENCOUNTER — Other Ambulatory Visit: Payer: Self-pay

## 2021-01-05 ENCOUNTER — Ambulatory Visit
Admission: RE | Admit: 2021-01-05 | Discharge: 2021-01-05 | Disposition: A | Discharge status: Home / Self Care | Payer: 59 | Source: Ambulatory Visit | Attending: General Surgery | Admitting: General Surgery

## 2021-01-05 DIAGNOSIS — Z0000 Encounter for general adult medical examination without abnormal findings: Secondary | ICD-10-CM

## 2021-01-05 DIAGNOSIS — Z1231 Encounter for screening mammogram for malignant neoplasm of breast: Secondary | ICD-10-CM | POA: Diagnosis not present

## 2021-01-05 IMAGING — MG MM DIGITAL SCREENING BILAT W/ TOMO AND CAD
8 series · 8 of 24 positions shown · non-contrast
Comparison: Previous exam(s).

CLINICAL DATA: Screening.

EXAM:
DIGITAL SCREENING BILATERAL MAMMOGRAM WITH TOMOSYNTHESIS AND CAD
TECHNIQUE: Bilateral screening digital craniocaudal and mediolateral oblique
mammograms were obtained. Bilateral screening digital breast
tomosynthesis was performed. The images were evaluated with
computer-aided detection.

[R CC synth-2D]
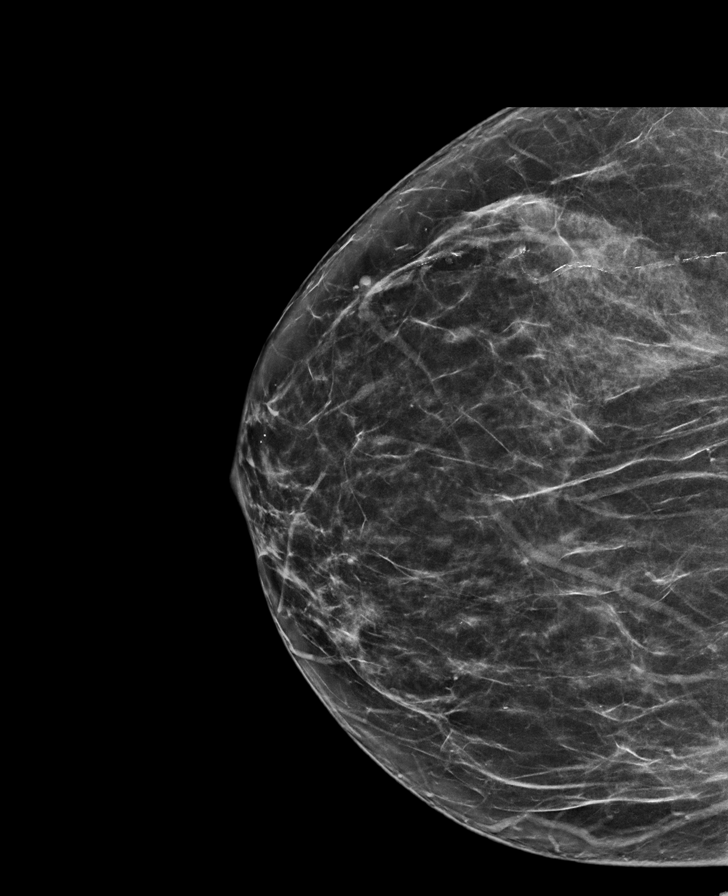

[L MLO synth-2D]
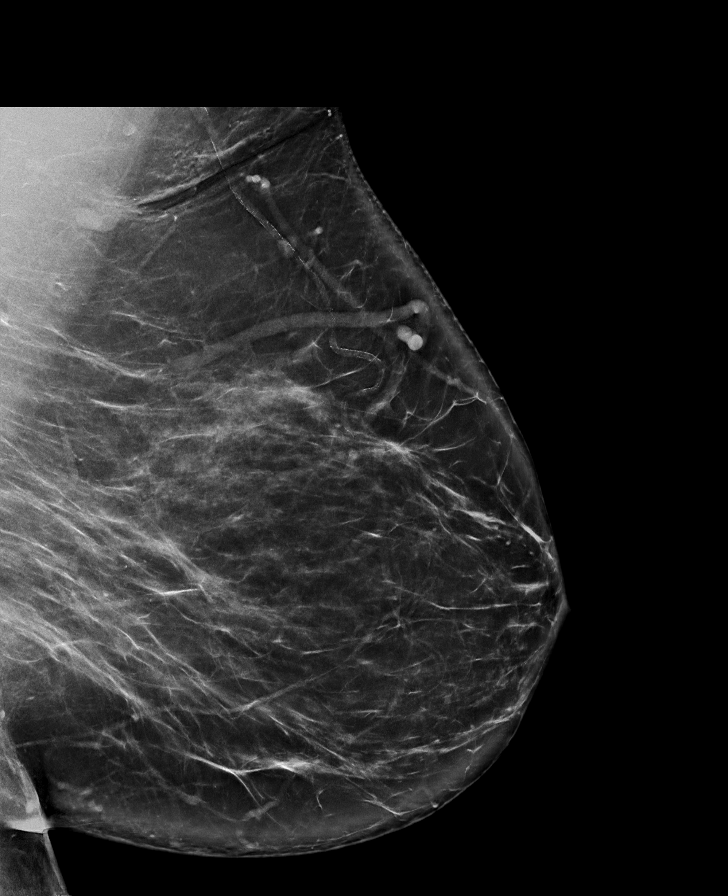

[R MLO synth-2D]
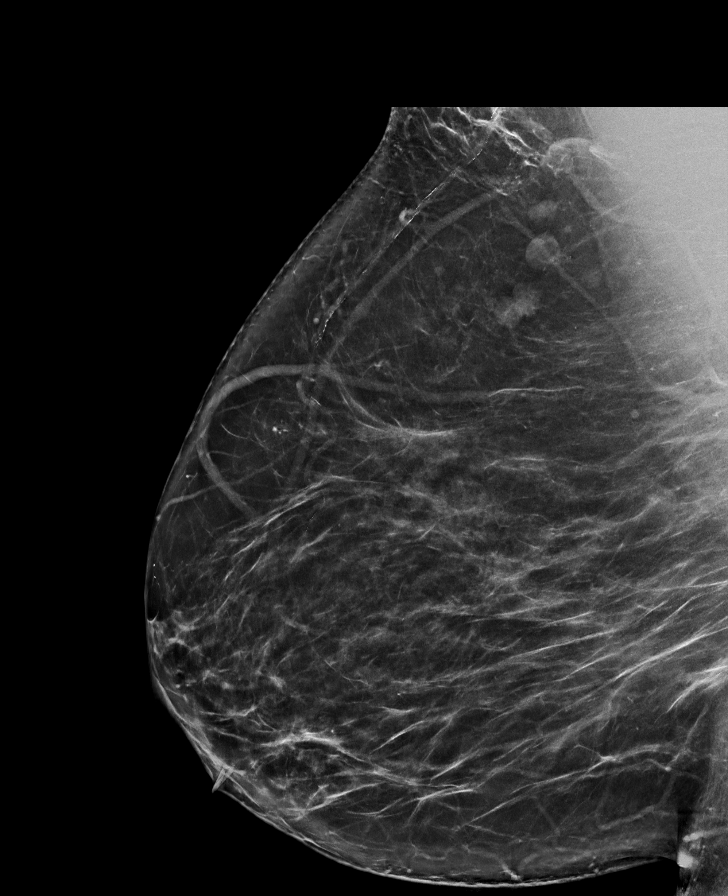

[L CC synth-2D]
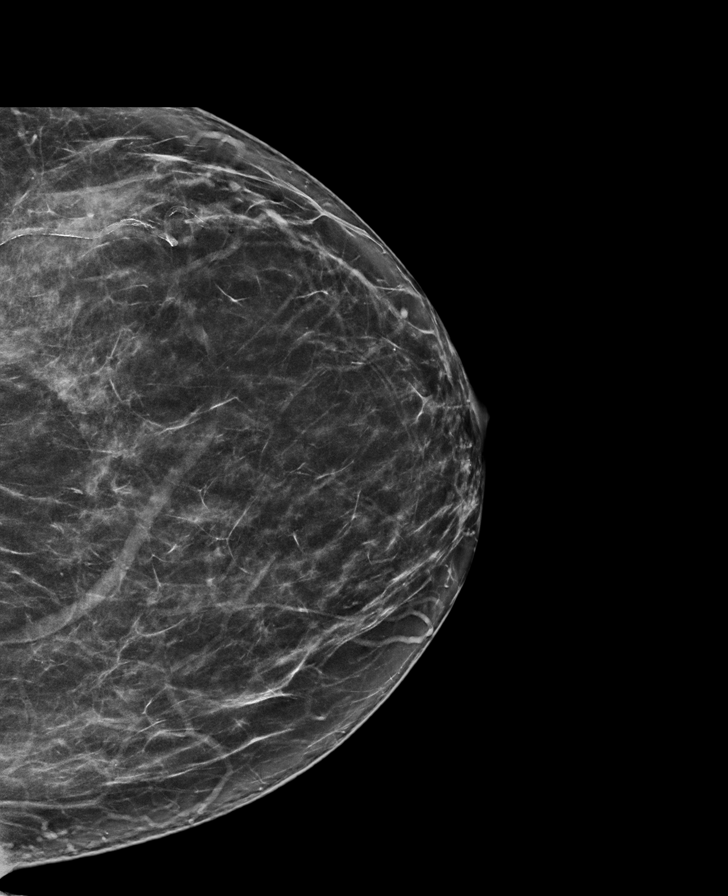

[R MLO tomo · tomo slice 47/92.0]
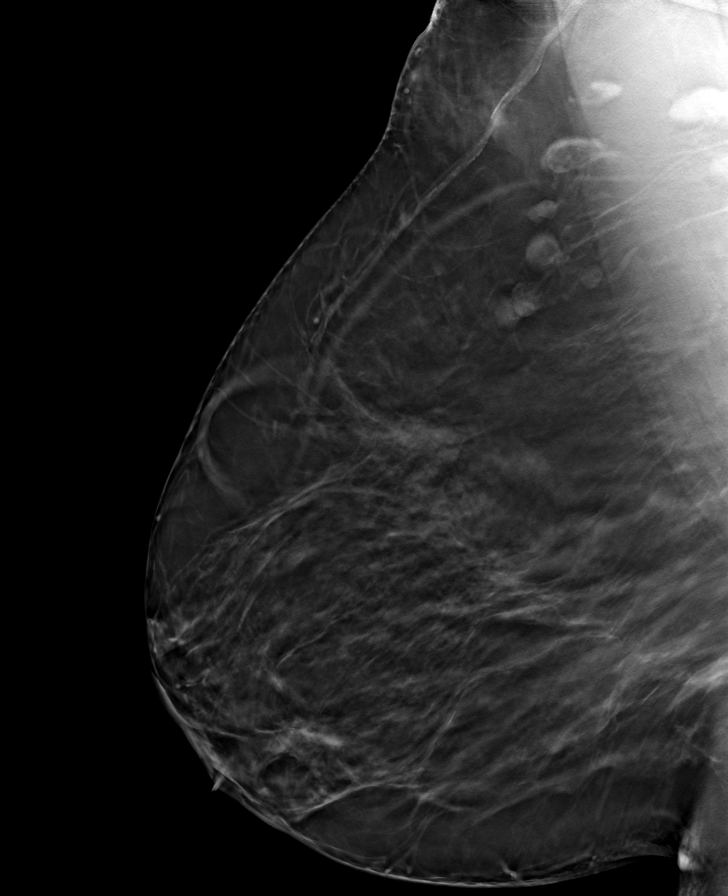

[R CC tomo · tomo slice 39/76.0]
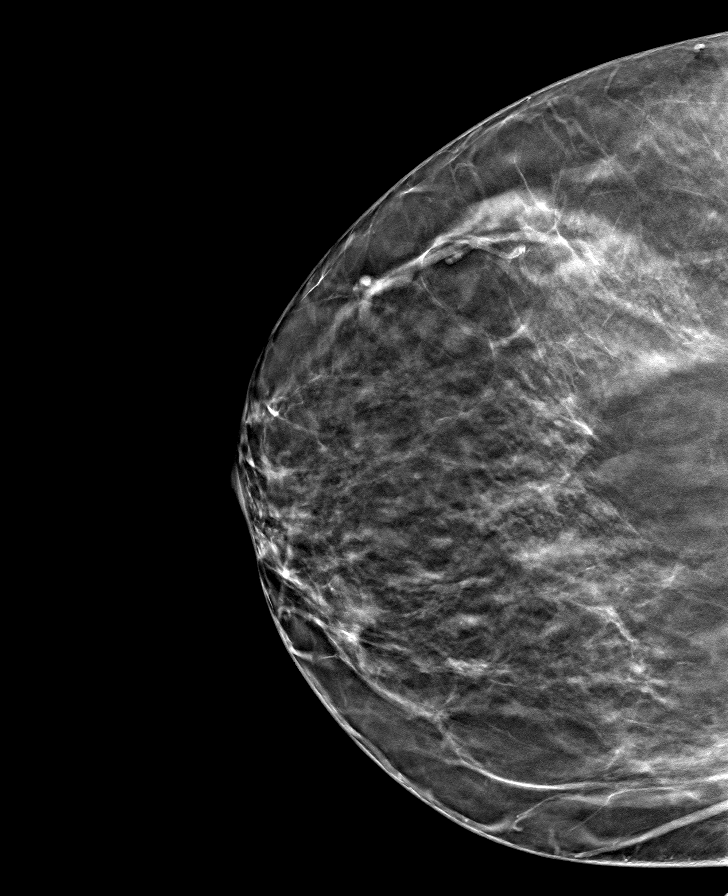

[L MLO tomo · tomo slice 49/97.0]
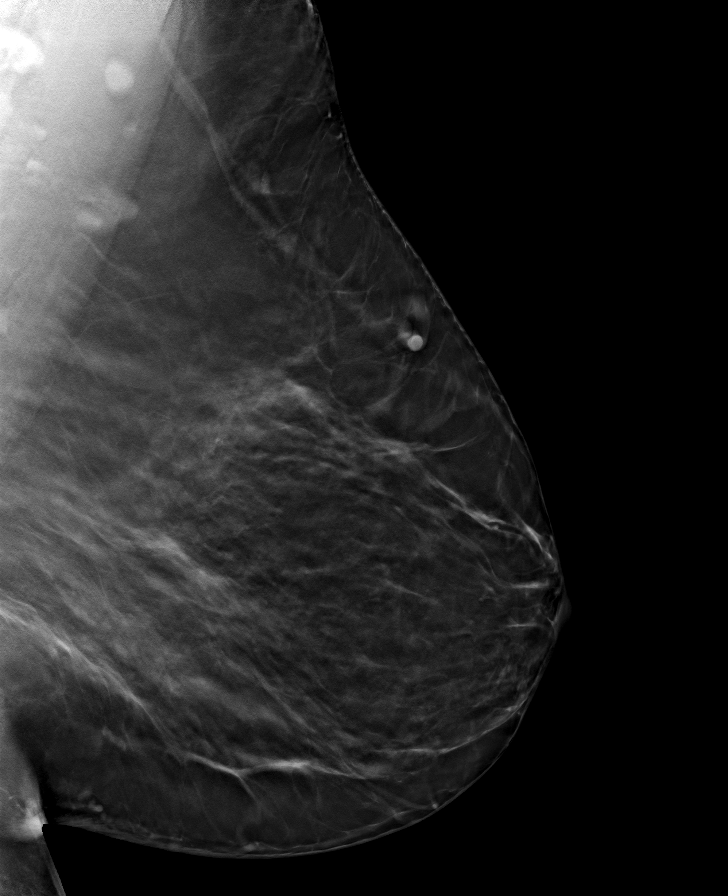

[L CC tomo · tomo slice 40/79.0]
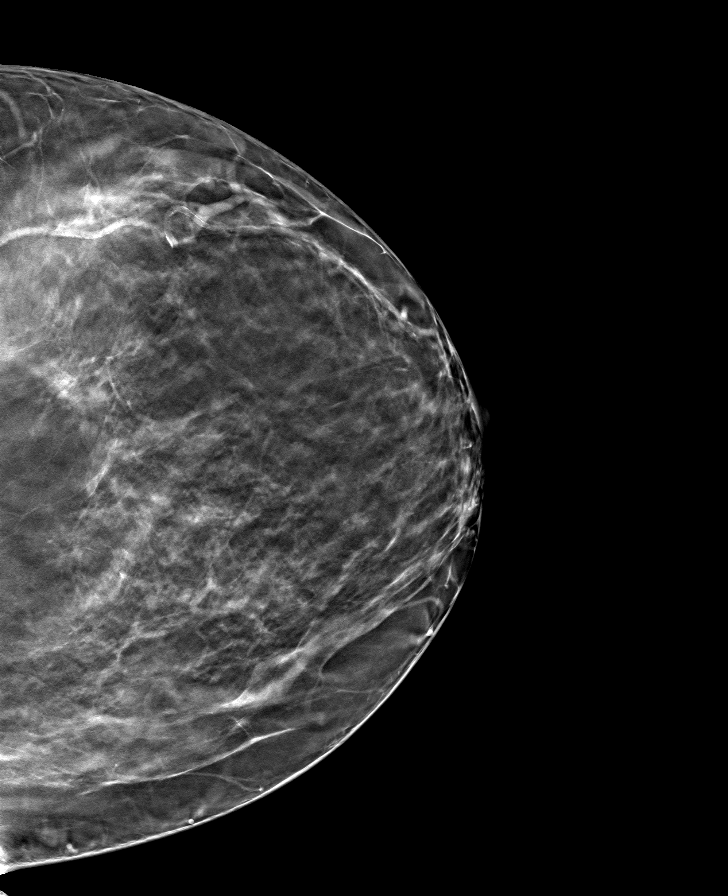

[8 of 24 positions shown; findings below may reference images not displayed]

ACR Breast Density Category b: There are scattered areas of
fibroglandular density.
FINDINGS: There are no findings suspicious for malignancy. The images were
evaluated with computer-aided detection.
IMPRESSION: No mammographic evidence of malignancy. A result letter of this
screening mammogram will be mailed directly to the patient.

RECOMMENDATION:
Screening mammogram in one year. (Code:[OD])

BI-RADS CATEGORY  1: Negative.

## 2021-01-11 ENCOUNTER — Other Ambulatory Visit: Payer: Self-pay | Admitting: Gastroenterology

## 2021-01-11 ENCOUNTER — Other Ambulatory Visit: Payer: Self-pay

## 2021-01-11 MED FILL — Alprazolam Tab 0.25 MG: ORAL | 30 days supply | Qty: 30 | Fill #0 | Status: AC

## 2021-01-12 ENCOUNTER — Other Ambulatory Visit: Payer: Self-pay

## 2021-01-12 MED ORDER — TRAMADOL HCL 50 MG PO TABS
50.0000 mg | ORAL_TABLET | Freq: Four times a day (QID) | ORAL | 1 refills | Status: DC | PRN
  Filled 2021-01-12: qty 30, 8d supply, fill #0
  Filled 2021-03-01: qty 30, 8d supply, fill #1

## 2021-01-12 MED FILL — Quetiapine Fumarate Tab ER 24HR 300 MG: ORAL | 90 days supply | Qty: 180 | Fill #0 | Status: AC

## 2021-01-13 ENCOUNTER — Other Ambulatory Visit: Payer: Self-pay

## 2021-01-16 ENCOUNTER — Inpatient Hospital Stay: Admission: RE | Admit: 2021-01-16 | Payer: 59 | Source: Ambulatory Visit

## 2021-01-16 ENCOUNTER — Other Ambulatory Visit: Payer: Self-pay

## 2021-01-18 ENCOUNTER — Other Ambulatory Visit: Payer: Self-pay

## 2021-01-23 ENCOUNTER — Other Ambulatory Visit: Payer: Self-pay

## 2021-01-23 ENCOUNTER — Other Ambulatory Visit (HOSPITAL_COMMUNITY): Payer: Self-pay

## 2021-01-23 MED ORDER — REYVOW 100 MG PO TABS
200.0000 mg | ORAL_TABLET | ORAL | 4 refills | Status: DC | PRN
  Filled 2021-01-23: qty 8, 30d supply, fill #0
  Filled 2021-03-01: qty 8, 30d supply, fill #1
  Filled 2021-04-14: qty 8, 30d supply, fill #2
  Filled 2021-05-16: qty 8, 30d supply, fill #3
  Filled 2021-06-22: qty 8, 30d supply, fill #4

## 2021-01-23 MED ORDER — QVAR REDIHALER 40 MCG/ACT IN AERB
INHALATION_SPRAY | RESPIRATORY_TRACT | 2 refills | Status: AC
  Filled 2021-01-23: qty 31.8, 90d supply, fill #0
  Filled 2021-01-25: qty 10.6, 30d supply, fill #0

## 2021-01-23 MED FILL — Insulin Degludec Soln Pen-Injector 200 Unit/ML: SUBCUTANEOUS | 30 days supply | Qty: 9 | Fill #0 | Status: AC

## 2021-01-23 MED FILL — Continuous Glucose System Sensor: 56 days supply | Qty: 4 | Fill #0 | Status: AC

## 2021-01-23 MED FILL — Baclofen Tab 10 MG: ORAL | 13 days supply | Qty: 40 | Fill #0 | Status: AC

## 2021-01-23 MED FILL — Ubrogepant Tab 100 MG: ORAL | 30 days supply | Qty: 10 | Fill #0 | Status: AC

## 2021-01-23 MED FILL — Promethazine HCl Tab 25 MG: ORAL | 7 days supply | Qty: 30 | Fill #0 | Status: AC

## 2021-01-23 MED FILL — Insulin Lispro Soln Pen-injector 100 Unit/ML (1 Unit Dial): SUBCUTANEOUS | 20 days supply | Qty: 15 | Fill #0 | Status: AC

## 2021-01-24 ENCOUNTER — Telehealth: Payer: Self-pay | Admitting: Radiology

## 2021-01-24 ENCOUNTER — Other Ambulatory Visit (HOSPITAL_COMMUNITY): Payer: Self-pay

## 2021-01-24 NOTE — Telephone Encounter (Signed)
Left message for patient to call [facility] to schedule July Appointment for headache follow up

## 2021-01-25 ENCOUNTER — Other Ambulatory Visit (HOSPITAL_COMMUNITY): Payer: Self-pay

## 2021-01-25 DIAGNOSIS — E7800 Pure hypercholesterolemia, unspecified: Secondary | ICD-10-CM | POA: Diagnosis not present

## 2021-01-25 DIAGNOSIS — G43909 Migraine, unspecified, not intractable, without status migrainosus: Secondary | ICD-10-CM | POA: Diagnosis not present

## 2021-01-25 DIAGNOSIS — I10 Essential (primary) hypertension: Secondary | ICD-10-CM | POA: Diagnosis not present

## 2021-01-25 DIAGNOSIS — E1165 Type 2 diabetes mellitus with hyperglycemia: Secondary | ICD-10-CM | POA: Diagnosis not present

## 2021-01-25 DIAGNOSIS — G4733 Obstructive sleep apnea (adult) (pediatric): Secondary | ICD-10-CM | POA: Diagnosis not present

## 2021-01-25 MED ORDER — GLUCOSE BLOOD VI STRP
ORAL_STRIP | 6 refills | Status: DC
  Filled 2021-05-16: qty 100, 33d supply, fill #0
  Filled 2021-06-22: qty 100, 30d supply, fill #0

## 2021-01-25 MED ORDER — FREESTYLE LIBRE SENSOR SYSTEM MISC
4 refills | Status: DC
  Filled 2021-01-25: qty 6, 84d supply, fill #0
  Filled 2021-05-16: qty 6, 28d supply, fill #0

## 2021-01-26 ENCOUNTER — Other Ambulatory Visit (HOSPITAL_COMMUNITY): Payer: Self-pay

## 2021-02-02 ENCOUNTER — Other Ambulatory Visit (HOSPITAL_COMMUNITY): Payer: Self-pay

## 2021-02-02 DIAGNOSIS — K219 Gastro-esophageal reflux disease without esophagitis: Secondary | ICD-10-CM | POA: Diagnosis not present

## 2021-02-02 DIAGNOSIS — K5904 Chronic idiopathic constipation: Secondary | ICD-10-CM | POA: Diagnosis not present

## 2021-02-02 DIAGNOSIS — K5730 Diverticulosis of large intestine without perforation or abscess without bleeding: Secondary | ICD-10-CM | POA: Diagnosis not present

## 2021-02-02 DIAGNOSIS — Z86010 Personal history of colonic polyps: Secondary | ICD-10-CM | POA: Diagnosis not present

## 2021-02-02 MED ORDER — LINZESS 290 MCG PO CAPS
ORAL_CAPSULE | ORAL | 4 refills | Status: DC
  Filled 2021-02-02: qty 90, 90d supply, fill #0
  Filled 2021-05-16: qty 90, 90d supply, fill #1

## 2021-02-02 MED ORDER — LINZESS 290 MCG PO CAPS
ORAL_CAPSULE | ORAL | 4 refills | Status: DC
  Filled 2021-02-02: qty 90, 90d supply, fill #0

## 2021-02-03 ENCOUNTER — Other Ambulatory Visit: Payer: Self-pay

## 2021-02-03 ENCOUNTER — Other Ambulatory Visit (HOSPITAL_COMMUNITY): Payer: Self-pay

## 2021-02-03 MED ORDER — DEXLANSOPRAZOLE 60 MG PO CPDR
DELAYED_RELEASE_CAPSULE | ORAL | 4 refills | Status: DC
  Filled 2021-02-03: qty 90, 90d supply, fill #0

## 2021-02-03 MED ORDER — DEXLANSOPRAZOLE 60 MG PO CPDR
60.0000 mg | DELAYED_RELEASE_CAPSULE | Freq: Every day | ORAL | 4 refills | Status: DC
  Filled 2021-02-03 – 2021-05-16 (×2): qty 90, 90d supply, fill #0
  Filled 2021-08-10: qty 90, 90d supply, fill #1
  Filled 2021-11-27: qty 90, 90d supply, fill #2

## 2021-02-03 MED FILL — Fenofibrate Tab 48 MG: ORAL | 30 days supply | Qty: 30 | Fill #1 | Status: AC

## 2021-02-03 MED FILL — Baclofen Tab 10 MG: ORAL | 13 days supply | Qty: 40 | Fill #1 | Status: AC

## 2021-02-03 MED FILL — Gabapentin Cap 100 MG: ORAL | 10 days supply | Qty: 90 | Fill #1 | Status: AC

## 2021-02-04 ENCOUNTER — Other Ambulatory Visit (HOSPITAL_COMMUNITY): Payer: Self-pay

## 2021-02-04 MED ORDER — MELOXICAM 15 MG PO TABS
15.0000 mg | ORAL_TABLET | Freq: Every day | ORAL | 2 refills | Status: DC
  Filled 2021-02-04: qty 90, 90d supply, fill #0
  Filled 2021-05-16: qty 90, 90d supply, fill #1
  Filled 2021-09-08: qty 90, 90d supply, fill #2

## 2021-02-06 ENCOUNTER — Other Ambulatory Visit (HOSPITAL_COMMUNITY): Payer: Self-pay

## 2021-02-08 ENCOUNTER — Other Ambulatory Visit (HOSPITAL_COMMUNITY): Payer: Self-pay

## 2021-02-11 ENCOUNTER — Other Ambulatory Visit (HOSPITAL_COMMUNITY): Payer: Self-pay

## 2021-02-13 ENCOUNTER — Other Ambulatory Visit: Payer: Self-pay

## 2021-02-13 DIAGNOSIS — E782 Mixed hyperlipidemia: Secondary | ICD-10-CM | POA: Diagnosis not present

## 2021-02-13 DIAGNOSIS — M25511 Pain in right shoulder: Secondary | ICD-10-CM | POA: Diagnosis not present

## 2021-02-13 DIAGNOSIS — K219 Gastro-esophageal reflux disease without esophagitis: Secondary | ICD-10-CM | POA: Diagnosis not present

## 2021-02-13 DIAGNOSIS — I10 Essential (primary) hypertension: Secondary | ICD-10-CM | POA: Diagnosis not present

## 2021-02-13 DIAGNOSIS — E119 Type 2 diabetes mellitus without complications: Secondary | ICD-10-CM | POA: Diagnosis not present

## 2021-02-13 DIAGNOSIS — G4733 Obstructive sleep apnea (adult) (pediatric): Secondary | ICD-10-CM | POA: Diagnosis not present

## 2021-02-13 DIAGNOSIS — Z794 Long term (current) use of insulin: Secondary | ICD-10-CM | POA: Diagnosis not present

## 2021-02-13 MED ORDER — LISINOPRIL 5 MG PO TABS
ORAL_TABLET | ORAL | 5 refills | Status: DC
  Filled 2021-02-13: qty 90, 90d supply, fill #0
  Filled 2021-02-17: qty 30, 30d supply, fill #0
  Filled 2021-03-17 – 2021-03-21 (×2): qty 30, 30d supply, fill #1
  Filled 2021-04-21: qty 30, 30d supply, fill #2
  Filled 2021-05-26: qty 30, 30d supply, fill #3
  Filled 2021-06-22: qty 30, 30d supply, fill #4
  Filled 2021-08-04: qty 30, 30d supply, fill #5

## 2021-02-15 ENCOUNTER — Other Ambulatory Visit (HOSPITAL_COMMUNITY): Payer: Self-pay

## 2021-02-17 ENCOUNTER — Other Ambulatory Visit (HOSPITAL_COMMUNITY): Payer: Self-pay

## 2021-02-17 MED FILL — Alprazolam Tab 0.25 MG: ORAL | 30 days supply | Qty: 30 | Fill #1 | Status: AC

## 2021-02-28 ENCOUNTER — Other Ambulatory Visit: Payer: Self-pay

## 2021-02-28 DIAGNOSIS — F411 Generalized anxiety disorder: Secondary | ICD-10-CM | POA: Diagnosis not present

## 2021-02-28 DIAGNOSIS — F3181 Bipolar II disorder: Secondary | ICD-10-CM | POA: Diagnosis not present

## 2021-02-28 MED ORDER — QUETIAPINE FUMARATE ER 300 MG PO TB24
ORAL_TABLET | ORAL | 0 refills | Status: DC
  Filled 2021-04-14: qty 180, 90d supply, fill #0

## 2021-02-28 MED ORDER — HYDROXYZINE HCL 10 MG PO TABS
ORAL_TABLET | ORAL | 0 refills | Status: DC
  Filled 2021-03-01: qty 180, 90d supply, fill #0

## 2021-02-28 MED ORDER — ALPRAZOLAM 0.25 MG PO TABS
ORAL_TABLET | ORAL | 2 refills | Status: DC
  Filled 2021-03-21: qty 30, 30d supply, fill #0
  Filled 2021-05-04: qty 30, 30d supply, fill #1
  Filled 2021-08-04: qty 30, 30d supply, fill #0

## 2021-02-28 MED ORDER — ZOLPIDEM TARTRATE ER 12.5 MG PO TBCR
EXTENDED_RELEASE_TABLET | ORAL | 0 refills | Status: DC
  Filled 2021-03-01: qty 90, 90d supply, fill #0

## 2021-03-01 ENCOUNTER — Other Ambulatory Visit (HOSPITAL_COMMUNITY): Payer: Self-pay

## 2021-03-01 MED FILL — Insulin Pen Needle 32 G X 4 MM (1/6" or 5/32"): 90 days supply | Qty: 400 | Fill #0 | Status: CN

## 2021-03-01 MED FILL — Insulin Lispro Soln Pen-injector 100 Unit/ML (1 Unit Dial): SUBCUTANEOUS | 20 days supply | Qty: 15 | Fill #1 | Status: AC

## 2021-03-01 MED FILL — Insulin Degludec Soln Pen-Injector 200 Unit/ML: SUBCUTANEOUS | 30 days supply | Qty: 9 | Fill #1 | Status: AC

## 2021-03-02 ENCOUNTER — Other Ambulatory Visit: Payer: Self-pay

## 2021-03-02 MED ORDER — DEXCOM G6 RECEIVER DEVI: 1 refills | Status: DC

## 2021-03-03 ENCOUNTER — Other Ambulatory Visit: Payer: Self-pay

## 2021-03-03 MED ORDER — DEXCOM G6 TRANSMITTER MISC: 4 refills | Status: DC

## 2021-03-03 MED ORDER — DEXCOM G6 SENSOR MISC
6 refills | Status: DC
  Filled 2021-05-16: qty 3, fill #0

## 2021-03-08 ENCOUNTER — Other Ambulatory Visit (HOSPITAL_COMMUNITY): Payer: Self-pay

## 2021-03-08 MED FILL — Insulin Pen Needle 32 G X 4 MM (1/6" or 5/32"): 90 days supply | Qty: 400 | Fill #0 | Status: CN

## 2021-03-10 ENCOUNTER — Other Ambulatory Visit (HOSPITAL_COMMUNITY): Payer: Self-pay

## 2021-03-14 ENCOUNTER — Other Ambulatory Visit (HOSPITAL_COMMUNITY): Payer: Self-pay

## 2021-03-16 ENCOUNTER — Other Ambulatory Visit: Payer: Self-pay | Admitting: Physician Assistant

## 2021-03-16 ENCOUNTER — Other Ambulatory Visit: Payer: Self-pay | Admitting: General Surgery

## 2021-03-16 ENCOUNTER — Other Ambulatory Visit (HOSPITAL_COMMUNITY): Payer: Self-pay

## 2021-03-16 DIAGNOSIS — G43009 Migraine without aura, not intractable, without status migrainosus: Secondary | ICD-10-CM

## 2021-03-16 DIAGNOSIS — Z9189 Other specified personal risk factors, not elsewhere classified: Secondary | ICD-10-CM

## 2021-03-17 ENCOUNTER — Other Ambulatory Visit: Payer: Self-pay

## 2021-03-17 ENCOUNTER — Other Ambulatory Visit (HOSPITAL_COMMUNITY): Payer: Self-pay

## 2021-03-17 MED ORDER — PROMETHAZINE HCL 25 MG PO TABS
ORAL_TABLET | Freq: Four times a day (QID) | ORAL | 1 refills | Status: DC | PRN
  Filled 2021-03-17: qty 30, 7d supply, fill #0
  Filled 2021-04-28: qty 30, 7d supply, fill #1

## 2021-03-17 MED FILL — Gabapentin Cap 100 MG: ORAL | 10 days supply | Qty: 90 | Fill #2 | Status: AC

## 2021-03-17 MED FILL — Quetiapine Fumarate Tab ER 24HR 300 MG: ORAL | 90 days supply | Qty: 180 | Fill #0 | Status: CN

## 2021-03-17 MED FILL — Gabapentin Cap 100 MG: ORAL | 10 days supply | Qty: 90 | Fill #2 | Status: CN

## 2021-03-18 ENCOUNTER — Other Ambulatory Visit (HOSPITAL_COMMUNITY): Payer: Self-pay

## 2021-03-21 ENCOUNTER — Other Ambulatory Visit: Payer: Self-pay

## 2021-03-21 ENCOUNTER — Other Ambulatory Visit (HOSPITAL_COMMUNITY): Payer: Self-pay

## 2021-03-21 MED ORDER — ATORVASTATIN CALCIUM 40 MG PO TABS
ORAL_TABLET | ORAL | 3 refills | Status: DC
  Filled 2021-04-03: qty 90, 90d supply, fill #0
  Filled 2021-08-10: qty 90, 90d supply, fill #1
  Filled 2021-11-30: qty 90, 90d supply, fill #2

## 2021-03-21 MED FILL — Ubrogepant Tab 100 MG: ORAL | 30 days supply | Qty: 10 | Fill #1 | Status: AC

## 2021-03-21 MED FILL — Chlorpromazine HCl Tab 25 MG: ORAL | 10 days supply | Qty: 30 | Fill #0 | Status: AC

## 2021-03-22 ENCOUNTER — Other Ambulatory Visit: Payer: Self-pay

## 2021-03-25 ENCOUNTER — Other Ambulatory Visit (HOSPITAL_COMMUNITY): Payer: Self-pay

## 2021-03-28 DIAGNOSIS — G4733 Obstructive sleep apnea (adult) (pediatric): Secondary | ICD-10-CM | POA: Diagnosis not present

## 2021-03-31 ENCOUNTER — Other Ambulatory Visit (HOSPITAL_COMMUNITY): Payer: Self-pay

## 2021-04-03 ENCOUNTER — Other Ambulatory Visit (HOSPITAL_COMMUNITY): Payer: Self-pay

## 2021-04-03 MED FILL — Fenofibrate Tab 48 MG: ORAL | 30 days supply | Qty: 30 | Fill #2 | Status: CN

## 2021-04-03 MED FILL — Fenofibrate Tab 48 MG: ORAL | 30 days supply | Qty: 30 | Fill #2 | Status: AC

## 2021-04-04 ENCOUNTER — Other Ambulatory Visit (HOSPITAL_COMMUNITY): Payer: Self-pay

## 2021-04-07 DIAGNOSIS — E7800 Pure hypercholesterolemia, unspecified: Secondary | ICD-10-CM | POA: Diagnosis not present

## 2021-04-07 DIAGNOSIS — I10 Essential (primary) hypertension: Secondary | ICD-10-CM | POA: Diagnosis not present

## 2021-04-07 DIAGNOSIS — G4733 Obstructive sleep apnea (adult) (pediatric): Secondary | ICD-10-CM | POA: Diagnosis not present

## 2021-04-07 DIAGNOSIS — E1165 Type 2 diabetes mellitus with hyperglycemia: Secondary | ICD-10-CM | POA: Diagnosis not present

## 2021-04-07 DIAGNOSIS — G43909 Migraine, unspecified, not intractable, without status migrainosus: Secondary | ICD-10-CM | POA: Diagnosis not present

## 2021-04-14 ENCOUNTER — Ambulatory Visit: Payer: 59 | Admitting: Physician Assistant

## 2021-04-14 ENCOUNTER — Other Ambulatory Visit: Payer: Self-pay

## 2021-04-14 ENCOUNTER — Encounter: Payer: Self-pay | Admitting: Physician Assistant

## 2021-04-14 ENCOUNTER — Other Ambulatory Visit (HOSPITAL_COMMUNITY): Payer: Self-pay

## 2021-04-14 VITALS — BP 109/68 | HR 97

## 2021-04-14 DIAGNOSIS — G43709 Chronic migraine without aura, not intractable, without status migrainosus: Secondary | ICD-10-CM | POA: Insufficient documentation

## 2021-04-14 MED ORDER — GABAPENTIN 300 MG PO CAPS
300.0000 mg | ORAL_CAPSULE | Freq: Three times a day (TID) | ORAL | 1 refills | Status: DC
  Filled 2021-04-14: qty 30, 10d supply, fill #0
  Filled 2021-05-16: qty 30, 10d supply, fill #1

## 2021-04-14 NOTE — Progress Notes (Signed)
History:  Julie Hansen is a 50 y.o. [id number] who presents to clinic today for headache eval.  She has used 3 tabs of neurontin at night and noticing some improvement in headache frequency.  No side effects with this medication. The severity has not improved appreciably.  One was so bad she had pain with her left ey and blurry vision which was new.  She had complete resolution of these symptoms with resolution of the headache.  For acute HA, she uses Ubrelvy, Ibuprofen and/or tylenol and 2 Reyvow.  She is doing this in a stepwise fashion and notes the [name] is not helpful.  She sometimes may also use Tramadol.  She notes using phenergan for acute migraine and it does not invoke sedation.  She does note some sleepiness with Reyvow and reports she does not drive on this medication.  She has not used thorazine lately.  No issues discontinuing Effexor.   Past Medical History:  Diagnosis Date   Acid reflux    Asthma    Diabetes mellitus without complication (HCC)    Hyperlipidemia    PCOS (polycystic ovarian syndrome)    PONV (postoperative nausea and vomiting)    Sleep apnea    uses cpap    Social History   Socioeconomic History   Marital status: Married    Spouse name: Not on file   Number of children: Not on file   Years of education: Not on file   Highest education level: Not on file  Occupational History   Not on file  Tobacco Use   Smoking status: Never   Smokeless tobacco: Never  Vaping Use   Vaping Use: Never used  Substance and Sexual Activity   Alcohol use: Yes    Comment: occas wine   Drug use: No   Sexual activity: Yes    Birth control/protection: None  Other Topics Concern   Not on file  Social History Narrative   Not on file   Social Determinants of Health   Financial Resource Strain: Not on file  Food Insecurity: Not on file  Transportation Needs: Not on file  Physical Activity: Not on file  Stress: Not on file  Social Connections: Not on file   Intimate Partner Violence: Not on file    Family History  Problem Relation Age of Onset   Cancer Mother    Hypertension Mother    Diabetes Mother    Breast cancer Mother 30   Cancer Maternal Grandmother    Breast cancer Maternal Grandmother 32   Cancer Father    Diabetes Father    Hypertension Father    Breast cancer Paternal Aunt 85   Breast cancer Paternal Aunt 57   Breast cancer Paternal Aunt 71    Allergies  Allergen Reactions   Macrodantin [Nitrofurantoin Macrocrystal] Itching   Carbamazepine Other (See Comments)    Vaginal discharge    Current Outpatient Medications on File Prior to Visit  Medication Sig Dispense Refill   albuterol (PROVENTIL) (2.5 MG/3ML) 0.083% nebulizer solution Take 2.5 mg by nebulization every 6 (six) hours as needed.     ALPRAZolam (XANAX) 0.25 MG tablet TAKE 1 TABLET BY MOUTH DAILY AS NEEDED 30 tablet 2   ALPRAZolam (XANAX) 0.25 MG tablet Take 1 daily as needed 30 tablet 2   ALPRAZolam (XANAX) 1 MG tablet Take 0.5 mg by mouth 2 (two) times daily as needed for anxiety.      atorvastatin (LIPITOR) 40 MG tablet Take by mouth.  atorvastatin (LIPITOR) 40 MG tablet Take one tablet (40 mg dose) by mouth daily. 90 tablet 3   baclofen (LIORESAL) 10 MG tablet TAKE 1 TABLET BY MOUTH DAILY AS NEEDED UP TO 3 TIMES DAILY 40 tablet 5   beclomethasone (QVAR REDIHALER) 40 MCG/ACT inhaler Inhale 2 puffs into the lungs 2 times daily 26.1 g 2   beclomethasone (QVAR) 40 MCG/ACT inhaler Inhale 2 puffs into the lungs 2 (two) times daily. prn     cetirizine (ZYRTEC ALLERGY) 10 MG tablet Take 1 tablet (10 mg total) by mouth daily. 30 tablet 0   chlorproMAZINE (THORAZINE) 25 MG tablet TAKE 1 TABLET BY MOUTH AS NEEDED FOR HEADACHE 30 tablet 2   Continuous Blood Gluc Receiver (DEXCOM G6 RECEIVER) DEVI as directed 1 each 1   Continuous Blood Gluc Sensor (DEXCOM G6 SENSOR) MISC use as directed subcutaneous every 10 days 3 each 6   Continuous Blood Gluc Sensor (FREESTYLE  LIBRE 2 SENSOR) MISC APPLY AS DIRECTED TO MONITOR BLOOD SUGAR EVERY 14 DAYS 4 each 2   Continuous Blood Gluc Sensor (FREESTYLE LIBRE SENSOR SYSTEM) MISC Use as directed every 14 days 6 each 4   Continuous Blood Gluc Transmit (DEXCOM G6 TRANSMITTER) MISC use every 90 days 1 each 4   dapagliflozin propanediol (FARXIGA) 10 MG TABS tablet Take 10 mg by mouth daily.     dapagliflozin propanediol (FARXIGA) 10 MG TABS tablet TAKE ONE TABLET BY MOUTH DAILY 30 tablet 3   dapagliflozin propanediol (FARXIGA) 10 MG TABS tablet Take one tablet (10 mg dose) by mouth daily. 30 tablet 3   dexlansoprazole (DEXILANT) 60 MG capsule take one capsule by mouth 20 minutes before breakfast 90 capsule 4   dexlansoprazole (DEXILANT) 60 MG capsule Take 1 capsule (60 mg total) by mouth daily 20 minutes before breakfast. 90 capsule 4   fenofibrate (TRICOR) 48 MG tablet TAKE ONE TABLET BY MOUTH DAILY 30 tablet 5   fluticasone (FLONASE) 50 MCG/ACT nasal spray PLACE 1 SPRAY INTO BOTH NOSTRILS DAILY. 16 g 0   gabapentin (NEURONTIN) 100 MG capsule TAKE 3 CAPSULES (300 MG) BY MOUTH 3 TIMES DAILY. TITRATE AS TOLERATED 90 capsule 3   glucose blood test strip USE AS DIRECTED 300 strip 2   glucose blood test strip Use to check blood sugar 3 times a day 100 each 6   hydrOXYzine (ATARAX/VISTARIL) 10 MG tablet TAKE 2 TABLETS BY MOUTH AT BEDTIME 180 tablet 0   hydrOXYzine (ATARAX/VISTARIL) 10 MG tablet TAKE 2 TABLETS BY MOUTH AT BEDTIME AS NEEDED 180 tablet 1   hydrOXYzine (ATARAX/VISTARIL) 10 MG tablet Take 2 tablets by mouth at bedtime 180 tablet 0   ibuprofen (ADVIL,MOTRIN) 800 MG tablet Take 1 tablet (800 mg total) by mouth every 8 (eight) hours as needed. 60 tablet 3   insulin degludec (TRESIBA) 200 UNIT/ML FlexTouch Pen INJECT 56 UNITS UNDER THE SKIN NIGHTLY AS DIRECTED 9 mL 3   insulin lispro (HUMALOG) 100 UNIT/ML injection Inject 10 Units into the skin 2 (two) times daily before a meal.      insulin lispro (HUMALOG) 100 UNIT/ML  KwikPen INJECT 20 - 25 UNITS SUBCUTANEOUS 3 TIMES A DAY 15 mL 6   insulin lispro (HUMALOG) 100 UNIT/ML KwikPen INJECT 20 TO 25 UNITS UNDER THE SKIN THREE TIMES A DAY 15 mL 6   insulin lispro (HUMALOG) 100 UNIT/ML KwikPen INJECT 10 UNITS INTO THE SKIN TWO TIMES DAILY 15 mL 5   Insulin Pen Needle 32G X 4 MM MISC USE AS  DIRECTED 4 TIMES A DAY 400 each 4   Lasmiditan Succinate (REYVOW) 100 MG TABS Take 2 tablets (200 mg) by mouth as needed for headache 8 tablet 4   Lasmiditan Succinate 100 MG TABS TAKE 2 TABLETS BY MOUTH AS NEEDED FOR HEADACHE 8 tablet 5   linaclotide (LINZESS) 290 MCG CAPS capsule Take 1 capsule by mouth 30 minutes before the first meal 90 capsule 4   linaclotide (LINZESS) 290 MCG CAPS capsule Take 1 capsule by mouth 30 minutes before the first meal 90 capsule 4   lisinopril (PRINIVIL,ZESTRIL) 5 MG tablet Take 5 mg by mouth daily.     lisinopril (ZESTRIL) 5 MG tablet TAKE ONE TABLET BY MOUTH DAILY 30 tablet 5   lisinopril (ZESTRIL) 5 MG tablet Take one tablet (5 mg dose) by mouth daily. 30 tablet 5   lisinopril (ZESTRIL) 5 MG tablet Take one tablet (5 mg dose) by mouth daily. 30 tablet 5   meloxicam (MOBIC) 15 MG tablet TAKE 1 TABLET BY MOUTH ONCE DAILY 30 tablet 0   meloxicam (MOBIC) 15 MG tablet Take 1 tablet (15 mg total) by mouth daily with a meal 90 tablet 2   oseltamivir (TAMIFLU) 75 MG capsule TAKE 1 CAPSULE (75 MG TOTAL) BY MOUTH EVERY 12 (TWELVE) HOURS. 10 capsule 0   promethazine (PHENERGAN) 25 MG tablet TAKE 1 TABLET BY MOUTH EVERY 6 HOURS AS NEEDED. 30 tablet 1   QUEtiapine (SEROQUEL XR) 300 MG 24 hr tablet TAKE 2 TABLETS BY MOUTH EVERY EVENING 180 tablet 0   QUEtiapine (SEROQUEL XR) 300 MG 24 hr tablet TAKE 2 TABLETS BY MOUTH EVERY EVENING 180 tablet 1   QUEtiapine (SEROQUEL XR) 300 MG 24 hr tablet Take 2 every evening 180 tablet 0   Semaglutide (OZEMPIC, 1 MG/DOSE, [place]) Inject into the skin.     Semaglutide, 1 MG/DOSE, (OZEMPIC, 1 MG/DOSE,) 4 MG/3ML SOPN INJECT 1 MG  INTO THE SKIN ONCE A WEEK 3 mL 3   Semaglutide, 1 MG/DOSE, 4 MG/3ML SOPN INJECT 1 MG INTO THE SKIN ONCE A WEEK 3 mL 3   traMADol (ULTRAM) 50 MG tablet TAKE 1 TABLET BY MOUTH EVERY 6 HOURS FOR 5 DAYS. 20 tablet 0   traMADol (ULTRAM) 50 MG tablet Take 1 tablet (50 mg total) by mouth every 6 (six) hours as needed for pain. 30 tablet 1   Ubrogepant 100 MG TABS TAKE 1 TABLET BY MOUTH AS NEEDED. 10 tablet 11   venlafaxine XR (EFFEXOR-XR) 37.5 MG 24 hr capsule TAKE 1 CAPSULE (37.5 MG TOTAL) BY MOUTH DAILY WITH BREAKFAST. 30 capsule 2   Vitamin D, Ergocalciferol, (DRISDOL) 1.25 MG (50000 UNIT) CAPS capsule TAKE ONE CAPSULE BY MOUTH TWO TIMES A WEEK 24 capsule 3   Vitamin D, Ergocalciferol, (DRISDOL) 1.25 MG (50000 UT) CAPS capsule      zolpidem (AMBIEN CR) 12.5 MG CR tablet TAKE 1 TABLET BY MOUTH AT BEDTIME 90 tablet 0   zolpidem (AMBIEN CR) 12.5 MG CR tablet Take 1 at bedtime 90 tablet 0   zolpidem (AMBIEN) 10 MG tablet Take 10 mg by mouth at bedtime as needed. Reported on 10/17/2015     dexlansoprazole (DEXILANT) 60 MG capsule TAKE 1 CAPSULE BY MOUTH 20 MINUTES BEFORE BREAKFAST 90 capsule 4   zolpidem (AMBIEN CR) 12.5 MG CR tablet TAKE 1 TABLET BY MOUTH AT BEDTIME FOR SLEEP 90 tablet 0   No current facility-administered medications on file prior to visit.     Review of Systems:  All pertinent  positive/negative included in HPI, all other review of systems are negative   Objective:  Physical Exam BP 109/68   Pulse 97  CONSTITUTIONAL: Well-developed, well-nourished female in no acute distress.  EYES: EOM intact ENT: Normocephalic CARDIOVASCULAR: Regular rate  RESPIRATORY: Normal rate.  MUSCULOSKELETAL: Normal ROMSKIN: Warm, dry without erythema  NEUROLOGICAL: Alert, oriented, CN II-XII grossly intact, Appropriate balance PSYCH: Normal behavior, mood   Assessment & Plan:  Assessment: 1. Chronic migraine without aura without status migrainosus, not intractable   Some improvement with  neurontin but continues to have chronic migraine   Plan: Neurontin - change to '300mg'$  tabs as she has tolerated well.  She will begin to use one tab at bedtime AND one additional tab in the morning for HA prevention.  She can then use an additional tab for acute headache when needed.  Continue Reyvow for acute HA.   Follow-up in 3 months or sooner PRN  Paticia Stack, PA-C 04/14/2021 8:24 AM

## 2021-04-17 ENCOUNTER — Other Ambulatory Visit (HOSPITAL_COMMUNITY): Payer: Self-pay

## 2021-04-21 ENCOUNTER — Other Ambulatory Visit (HOSPITAL_COMMUNITY): Payer: Self-pay

## 2021-04-24 ENCOUNTER — Other Ambulatory Visit (HOSPITAL_COMMUNITY): Payer: Self-pay

## 2021-04-26 ENCOUNTER — Other Ambulatory Visit (HOSPITAL_COMMUNITY): Payer: Self-pay

## 2021-04-27 ENCOUNTER — Other Ambulatory Visit (HOSPITAL_COMMUNITY): Payer: Self-pay

## 2021-04-28 ENCOUNTER — Other Ambulatory Visit (HOSPITAL_COMMUNITY): Payer: Self-pay

## 2021-04-28 ENCOUNTER — Other Ambulatory Visit: Payer: Self-pay

## 2021-04-28 ENCOUNTER — Ambulatory Visit
Admission: RE | Admit: 2021-04-28 | Discharge: 2021-04-28 | Disposition: A | Discharge status: Home / Self Care | Payer: 59 | Source: Ambulatory Visit | Attending: General Surgery | Admitting: General Surgery

## 2021-04-28 DIAGNOSIS — Z9189 Other specified personal risk factors, not elsewhere classified: Secondary | ICD-10-CM

## 2021-04-28 DIAGNOSIS — Z803 Family history of malignant neoplasm of breast: Secondary | ICD-10-CM | POA: Diagnosis not present

## 2021-04-28 IMAGING — MR MR BREAST BILAT WO/W CM
8 of 12 series · 33 of 48 positions shown · IV contrast (10 ml gadavist)
Comparison: Previous exam(s).

CLINICAL DATA: Family history of breast cancer. History of excision
of a right breast ductal papilloma.

LABS:  None obtained on site.
EXAM:
BILATERAL BREAST MRI WITH AND WITHOUT CONTRAST
TECHNIQUE: Multiplanar, multisequence MR images of both breasts were obtained
prior to and following the intravenous administration of 10 ml of
Gadavist

[Series 3: t2_tirm_tra ipat (a-p) · axial · 3.0mm · 0.74mm/px · 1 of 55 slices shown]
[im 1/55]
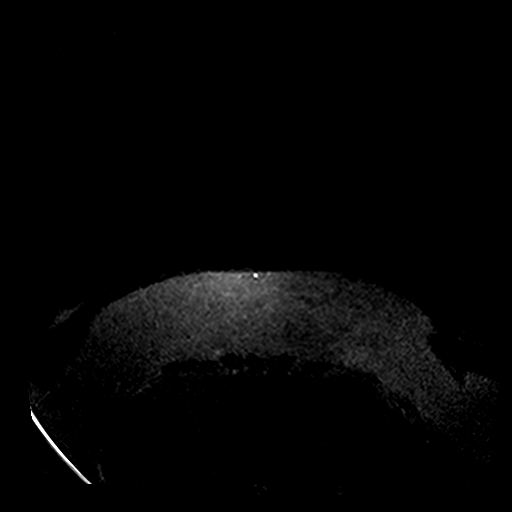

[Series 4: fl3d pre-cm no · axial · non-contrast · 1.2mm · 0.99mm/px · z∈[-27,+145]mm · 5 of 144 slices shown]
[im 1/144]
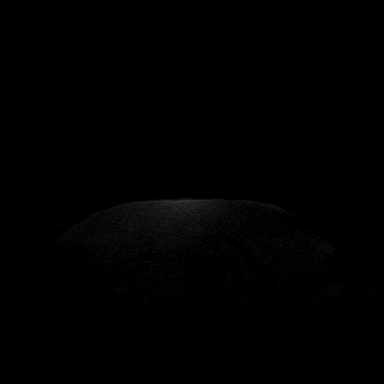
[im 36/144]
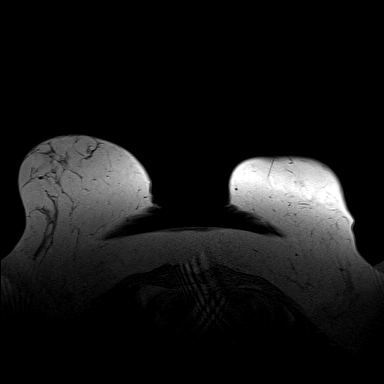
[im 72/144]
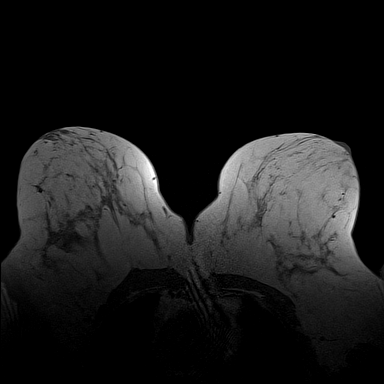
[im 108/144]
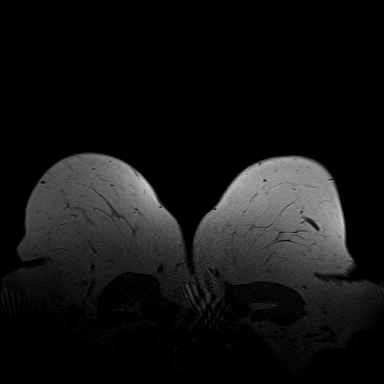
[im 144/144]
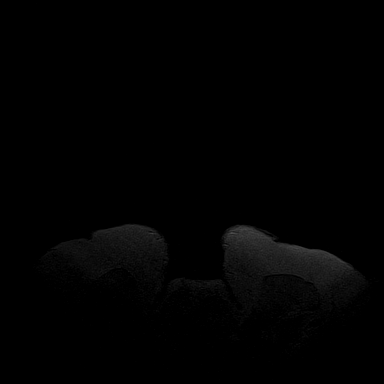

[Series 5: fl3d pre-cm · axial · non-contrast · 1.2mm · 0.99mm/px · z∈[-27,+145]mm · 5 of 144 slices shown]
[im 1/144]
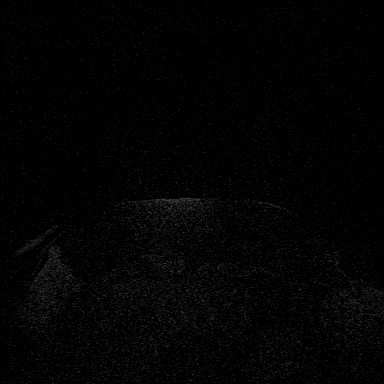
[im 36/144]
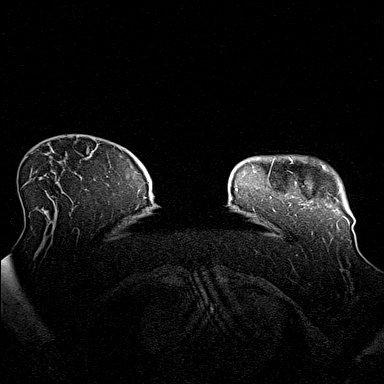
[im 72/144]
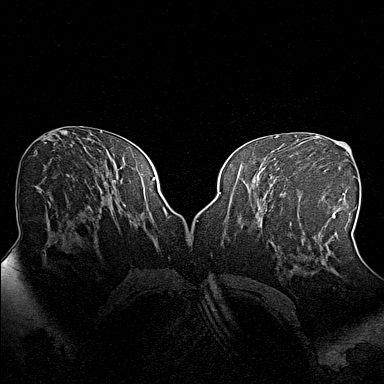
[im 108/144]
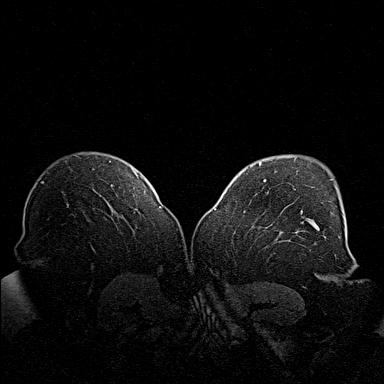
[im 144/144]
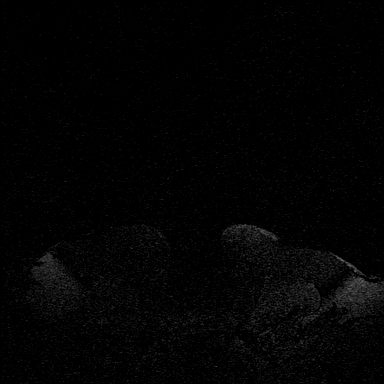

[Series 6: fl3d post-cm 20 · axial · 1.2mm · 0.99mm/px · z∈[-27,+145]mm · 5 of 144 slices shown (1 of 3)]
[im 1/144]
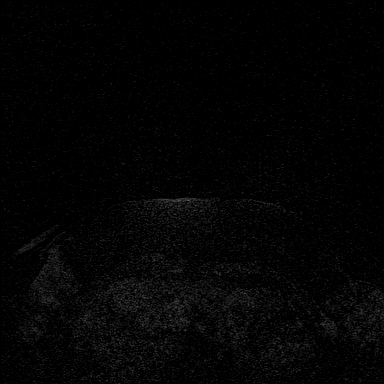
[im 36/144]
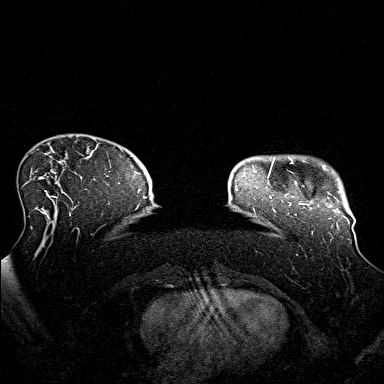
[im 72/144]
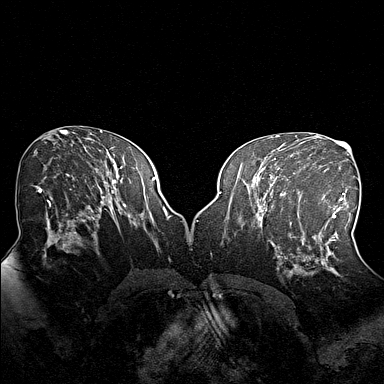
[im 108/144]
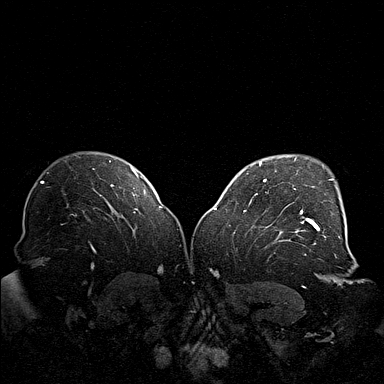
[im 144/144]
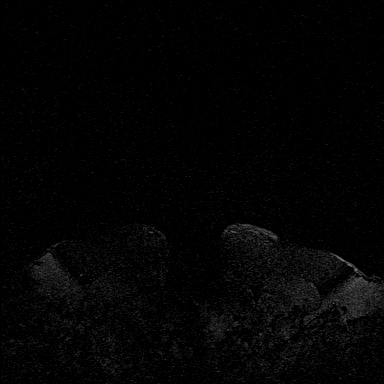

[Series 7: fl3d post-cm 20 · axial · 1.2mm · 0.99mm/px · z∈[-27,+145]mm · 5 of 144 slices shown (2 of 3)]
[im 1/144]
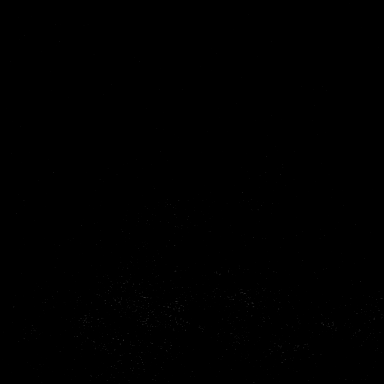
[im 36/144]
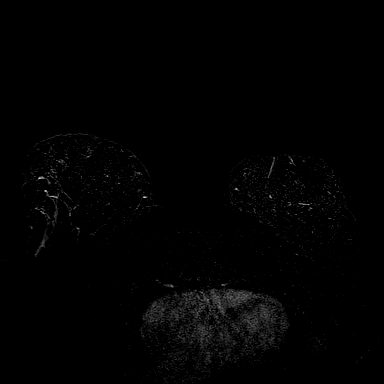
[im 72/144]
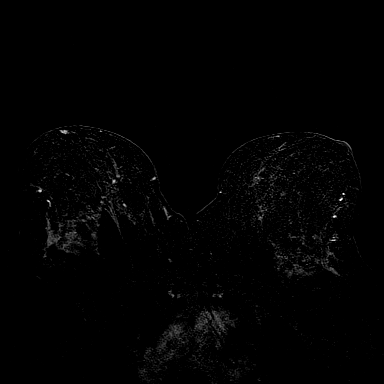
[im 108/144]
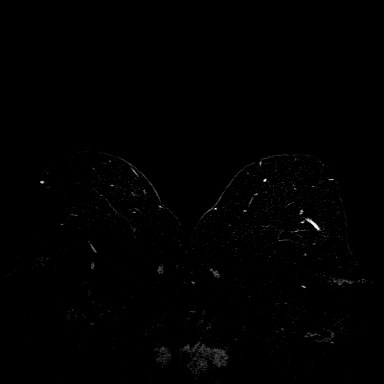
[im 144/144]
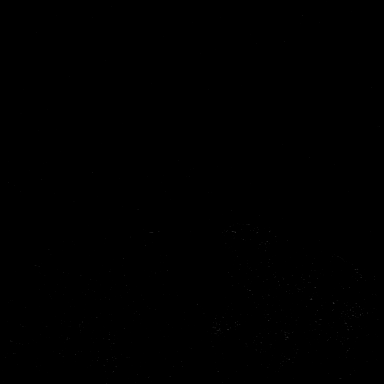

[Series 8: fl3d post-cm 20 · axial · 172.8mm · 0.99mm/px · 1 of 1 slices shown (3 of 3)]
[im 1/1]
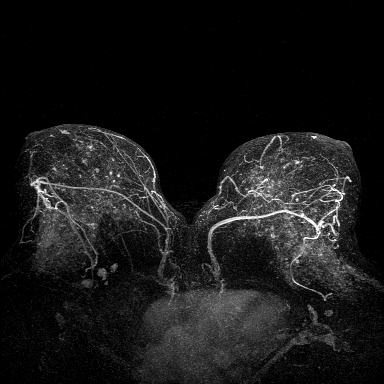

[Series 9: fl3d post-cm 3min · axial · 1.2mm · 0.99mm/px · z∈[-27,+145]mm · 6 of 144 slices shown]
[im 1/144]
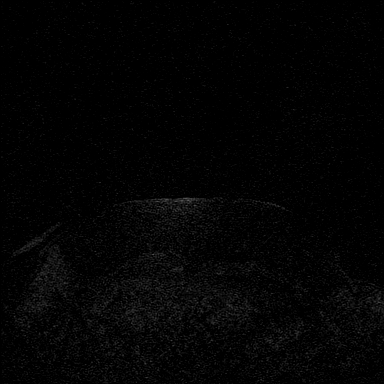
[im 29/144]
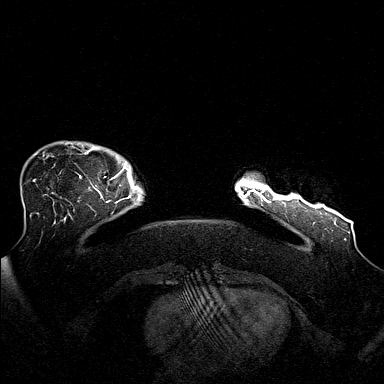
[im 58/144]
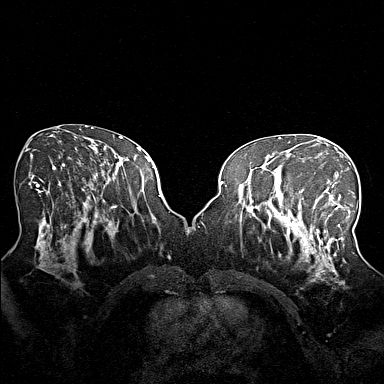
[im 86/144]
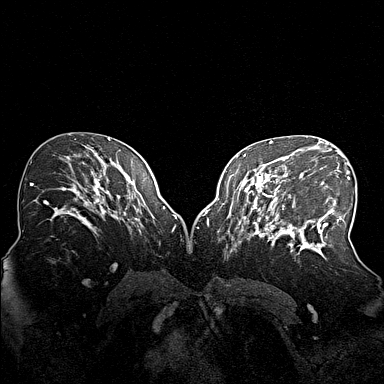
[im 115/144]
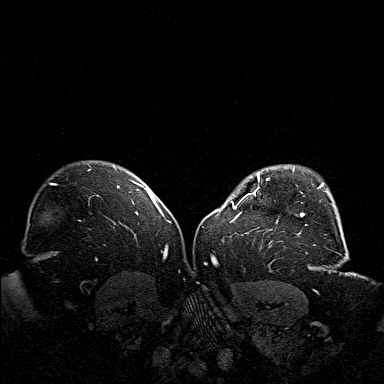
[im 144/144]
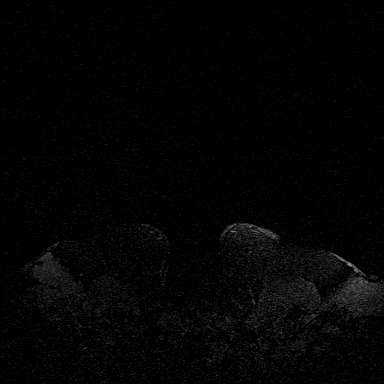

[Series 10: fl3d post-cm 3min_sub · axial · 1.2mm · 0.99mm/px · z∈[-27,+110]mm · 5 of 144 slices shown]
[im 1/144]
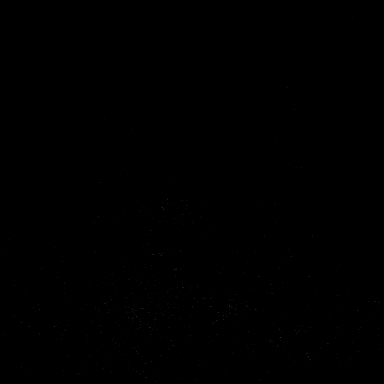
[im 29/144]
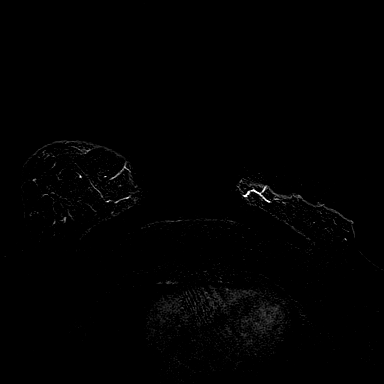
[im 58/144]
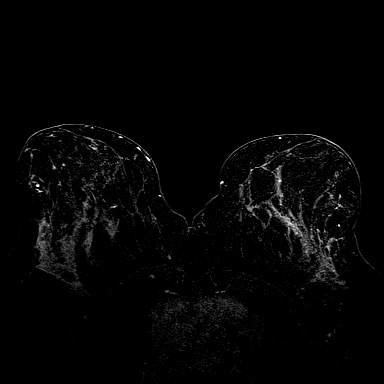
[im 86/144]
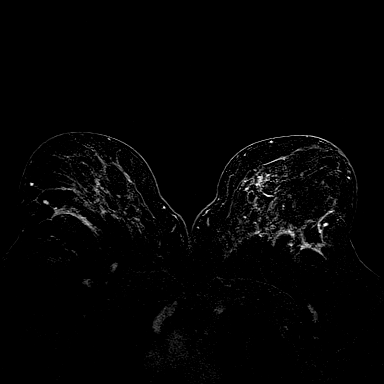
[im 115/144]
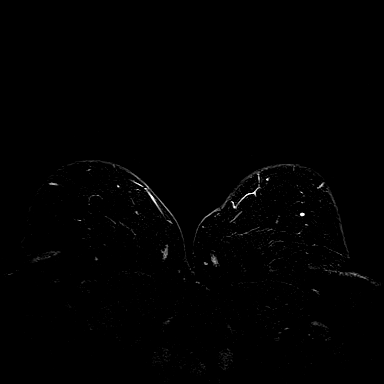

[33 of 48 positions shown; findings below may reference images not displayed]

Three-dimensional MR images were rendered by post-processing of the
original MR data on an independent workstation. The
three-dimensional MR images were interpreted, and findings are
reported in the following complete MRI report for this study. Three
dimensional images were evaluated at the independent interpreting
workstation using the DynaCAD thin client.
FINDINGS: Breast composition: b. Scattered fibroglandular tissue.

Background parenchymal enhancement: Moderate.

Right breast: No mass or abnormal enhancement.

Left breast: No mass or abnormal enhancement.

Lymph nodes: No abnormal appearing lymph nodes.

Ancillary findings:  None.
IMPRESSION: No abnormal enhancement in either breast.

RECOMMENDATION:
Bilateral screening mammogram in [DATE] is recommended.

The American Cancer Society recommends annual MRI and mammography in
patients with an estimated lifetime risk of developing breast cancer
greater than 20 - 25%, or who are known or suspected to be positive
for the breast cancer gene.

BI-RADS CATEGORY  1: Negative.

## 2021-04-28 MED ORDER — GADOBUTROL 1 MMOL/ML IV SOLN
10.0000 mL | Freq: Once | INTRAVENOUS | Status: AC | PRN
  Administered 2021-04-28: 10 mL via INTRAVENOUS

## 2021-04-28 MED FILL — Ergocalciferol Cap 1.25 MG (50000 Unit): ORAL | 84 days supply | Qty: 24 | Fill #0 | Status: AC

## 2021-05-01 ENCOUNTER — Other Ambulatory Visit (HOSPITAL_COMMUNITY): Payer: Self-pay

## 2021-05-01 MED ORDER — INSULIN DEGLUDEC 200 UNIT/ML ~~LOC~~ SOPN
PEN_INJECTOR | SUBCUTANEOUS | 3 refills | Status: DC
  Filled 2021-05-01: qty 12, 27d supply, fill #0
  Filled 2021-05-16: qty 18, 41d supply, fill #1

## 2021-05-03 ENCOUNTER — Other Ambulatory Visit (HOSPITAL_COMMUNITY): Payer: Self-pay

## 2021-05-04 ENCOUNTER — Other Ambulatory Visit: Payer: Self-pay

## 2021-05-04 MED ORDER — TRAMADOL HCL 50 MG PO TABS
ORAL_TABLET | ORAL | 1 refills | Status: DC
  Filled 2021-05-04: qty 30, 7d supply, fill #0
  Filled 2021-06-22: qty 30, 7d supply, fill #1

## 2021-05-06 ENCOUNTER — Other Ambulatory Visit (HOSPITAL_COMMUNITY): Payer: Self-pay

## 2021-05-11 ENCOUNTER — Encounter: Payer: 59 | Attending: Family Medicine | Admitting: Nutrition

## 2021-05-11 ENCOUNTER — Other Ambulatory Visit: Payer: Self-pay

## 2021-05-11 DIAGNOSIS — E66812 Obesity, class 2: Secondary | ICD-10-CM

## 2021-05-11 DIAGNOSIS — E782 Mixed hyperlipidemia: Secondary | ICD-10-CM

## 2021-05-11 DIAGNOSIS — E1165 Type 2 diabetes mellitus with hyperglycemia: Secondary | ICD-10-CM | POA: Insufficient documentation

## 2021-05-11 DIAGNOSIS — E118 Type 2 diabetes mellitus with unspecified complications: Secondary | ICD-10-CM | POA: Insufficient documentation

## 2021-05-11 DIAGNOSIS — IMO0002 Reserved for concepts with insufficient information to code with codable children: Secondary | ICD-10-CM

## 2021-05-11 NOTE — Progress Notes (Signed)
Medical Nutrition Therapy  [employer] employee  ID [id number] First visit. Appointment Start time:  45  Appointment End time:  1400  Primary concerns today: DM Type 2  Referral diagnosis: E11.8 Preferred learning style: no preference  Learning readiness: Ready    NUTRITION ASSESSMENT  Sees Dr. Chalmers Cater in [facility] for Endocrinology.  Anthropometrics  Wt Readings from Last 3 Encounters:  07/27/20 229 lb (103.9 kg)  06/28/20 224 lb 3.3 oz (101.7 kg)  06/03/20 220 lb 6.4 oz (100 kg)   Ht Readings from Last 3 Encounters:  07/27/20 '5\' 4"'$  (1.626 m)  06/28/20 '5\' 4"'$  (1.626 m)  09/12/15 '5\' 4"'$  (1.626 m)   There is no height or weight on file to calculate BMI. '@BMIFA'$ @ Facility age limit for growth percentiles is 20 years. Facility age limit for growth percentiles is 20 years.   CMP Latest Ref Rng & Units 07/29/2020 06/23/2020 12/23/2019  Glucose 65 - 99 mg/dL 128(H) 159(H) 254(H)  BUN 6 - 24 mg/dL '10 11 14  '$ Creatinine 0.57 - 1.00 mg/dL 0.95 0.87 0.92  Sodium 134 - 144 mmol/L 137 137 135  Potassium 3.5 - 5.2 mmol/L 4.2 3.8 3.5  Chloride 96 - 106 mmol/L 102 103 103  CO2 20 - 29 mmol/L '21 26 23  '$ Calcium 8.7 - 10.2 mg/dL 9.4 9.2 8.8(L)  Total Protein 6.5 - 8.1 g/dL - - -  Total Bilirubin 0.3 - 1.2 mg/dL - - -  Alkaline Phos 38 - 126 U/L - - -  AST 15 - 41 U/L - - -  ALT 0 - 44 U/L - - -   Lab Results  Component Value Date   HGBA1C 7.9 (H) 12/23/2019     Clinical Medical Hx: Asthma, GERD, IBS, Hyperlipidemia,   Medications: Dexcom, Farxiga, Tricor, Tresiba 26 units in am and 60 units in pm  daily, Humalog 20 units with meals plus SS, Ozempic, Vit D  Lab Results  Component Value Date   HGBA1C 7.9 (H) 12/23/2019   CMP Latest Ref Rng & Units 07/29/2020 06/23/2020 12/23/2019  Glucose 65 - 99 mg/dL 128(H) 159(H) 254(H)  BUN 6 - 24 mg/dL '10 11 14  '$ Creatinine 0.57 - 1.00 mg/dL 0.95 0.87 0.92  Sodium 134 - 144 mmol/L 137 137 135  Potassium 3.5 - 5.2 mmol/L 4.2 3.8 3.5  Chloride 96 - 106 mmol/L 102  103 103  CO2 20 - 29 mmol/L '21 26 23  '$ Calcium 8.7 - 10.2 mg/dL 9.4 9.2 8.8(L)  Total Protein 6.5 - 8.1 g/dL - - -  Total Bilirubin 0.3 - 1.2 mg/dL - - -  Alkaline Phos 38 - 126 U/L - - -  AST 15 - 41 U/L - - -  ALT 0 - 44 U/L - - -   Lipid Panel     Component Value Date/Time   CHOL 235 (H) 12/23/2019 0832   TRIG 428 (H) 12/23/2019 0832   HDL 60 12/23/2019 0832   CHOLHDL 3.9 12/23/2019 0832   VLDL UNABLE TO CALCULATE IF TRIGLYCERIDE OVER 400 mg/dL 12/23/2019 0832   LDLCALC UNABLE TO CALCULATE IF TRIGLYCERIDE OVER 400 mg/dL 12/23/2019 0832   LDLDIRECT 121.5 (H) 12/23/2019 0832    Notable Signs/Symptoms: none  Lifestyle & Dietary Hx   Estimated daily fluid intake: 48 oz Supplements: VIt D Sleep: good Stress / self-care: work and life Current average weekly physical activity: ADL  24-Hr Dietary Recall  Eats 2-3 meals daily. Usually skips breakfast. Eats late at night sometime.  Estimated Energy Needs  Calories: 1200 Carbohydrate: 135g Protein: 90g Fat: 33g   NUTRITION DIAGNOSIS  NB-1.1 Food and nutrition-related knowledge deficit As related to Diabetes Type 2.  As evidenced by A1C 7.9% .   NUTRITION INTERVENTION  Nutrition education (E-1) on the following topi Nutrition and Diabetes education provided on My Plate, CHO counting, meal planning, portion sizes, timing of meals, avoiding snacks between meals unless having a low blood sugar, target ranges for A1C and blood sugars, signs/symptoms and treatment of hyper/hypoglycemia, monitoring blood sugars, taking medications as prescribed, benefits of exercising 30 minutes per day and prevention of complications of DM.  Handouts Provided Include  MY Plate Meal Plan Card Diabetes Instructions.  Learning Style & Readiness for Change Teaching method utilized: Visual & Auditory  Demonstrated degree of understanding via: Teach Back  Barriers to learning/adherence to lifestyle change: none  Goals Established by Pt Follow  MY Plate Eat three meals per day at times discusssed Drink 100 oz of water per day Exercise 30 minutes daily. Use Dexcom to help evaluate blood sugars and impact of food choices. Eat 30 g CHO at each meal Do not skip meals. Get A1C down to 7% Lose 1 lb per week.    MONITORING & EVALUATION Dietary intake, weekly physical activity, and blood sugars in 1 month.  Next Steps  Patient is to work on meal planning and eating meals on time.Marland Kitchen

## 2021-05-15 DIAGNOSIS — G4733 Obstructive sleep apnea (adult) (pediatric): Secondary | ICD-10-CM | POA: Diagnosis not present

## 2021-05-16 ENCOUNTER — Other Ambulatory Visit (HOSPITAL_COMMUNITY): Payer: Self-pay

## 2021-05-16 MED ORDER — FARXIGA 10 MG PO TABS
ORAL_TABLET | ORAL | 3 refills | Status: DC
  Filled 2021-05-16: qty 30, 30d supply, fill #0
  Filled 2021-06-22: qty 30, 30d supply, fill #1
  Filled 2022-03-06: qty 30, 30d supply, fill #2
  Filled 2022-04-11: qty 30, 30d supply, fill #3

## 2021-05-16 MED FILL — Baclofen Tab 10 MG: ORAL | 13 days supply | Qty: 40 | Fill #2 | Status: AC

## 2021-05-16 MED FILL — Insulin Lispro Soln Pen-injector 100 Unit/ML (1 Unit Dial): SUBCUTANEOUS | 20 days supply | Qty: 15 | Fill #2 | Status: AC

## 2021-05-16 MED FILL — Continuous Glucose System Sensor: 56 days supply | Qty: 4 | Fill #1 | Status: AC

## 2021-05-16 MED FILL — Insulin Pen Needle 32 G X 4 MM (1/6" or 5/32"): 90 days supply | Qty: 400 | Fill #0 | Status: AC

## 2021-05-17 ENCOUNTER — Other Ambulatory Visit (HOSPITAL_COMMUNITY): Payer: Self-pay

## 2021-05-22 ENCOUNTER — Other Ambulatory Visit (HOSPITAL_COMMUNITY): Payer: Self-pay

## 2021-05-25 ENCOUNTER — Encounter: Payer: Self-pay | Admitting: Nutrition

## 2021-05-25 NOTE — Patient Instructions (Signed)
  Goals Established by Pt Follow MY Plate Eat three meals per day at times discusssed Drink 100 oz of water per day Exercise 30 minutes daily. Use Dexcom to help evaluate blood sugars and impact of food choices. Eat 30 g CHO at each meal Do not skip meals. Get A1C down to 7% Lose 1 lb per week.

## 2021-05-26 ENCOUNTER — Ambulatory Visit: Payer: Self-pay

## 2021-05-26 ENCOUNTER — Ambulatory Visit: Payer: 59 | Admitting: Family Medicine

## 2021-05-26 ENCOUNTER — Ambulatory Visit (INDEPENDENT_AMBULATORY_CARE_PROVIDER_SITE_OTHER): Payer: 59

## 2021-05-26 ENCOUNTER — Other Ambulatory Visit: Payer: Self-pay

## 2021-05-26 ENCOUNTER — Other Ambulatory Visit (HOSPITAL_COMMUNITY): Payer: Self-pay

## 2021-05-26 ENCOUNTER — Encounter: Payer: Self-pay | Admitting: Family Medicine

## 2021-05-26 VITALS — BP 110/78 | HR 86 | Ht 64.0 in | Wt 234.8 lb

## 2021-05-26 DIAGNOSIS — M25562 Pain in left knee: Secondary | ICD-10-CM

## 2021-05-26 IMAGING — DX DG KNEE AP/LAT W/ SUNRISE*L*
3 series · 3 of 3 positions shown · non-contrast
Comparison: None.

CLINICAL DATA: Left knee pain.

EXAM:
LEFT KNEE 3 VIEWS

[knee ap]
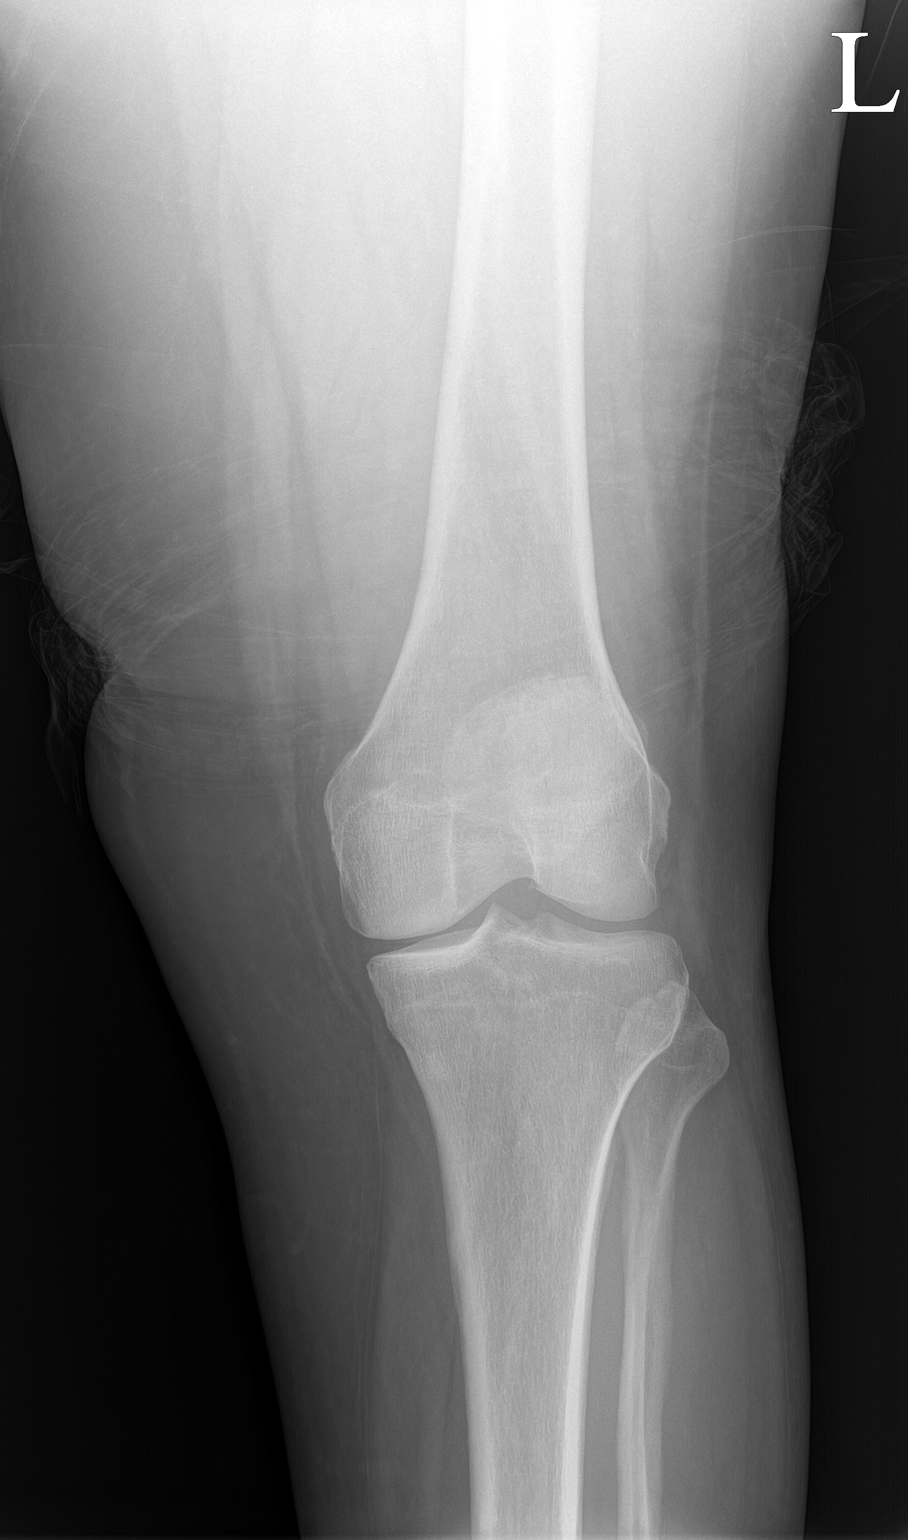

[knee lat]
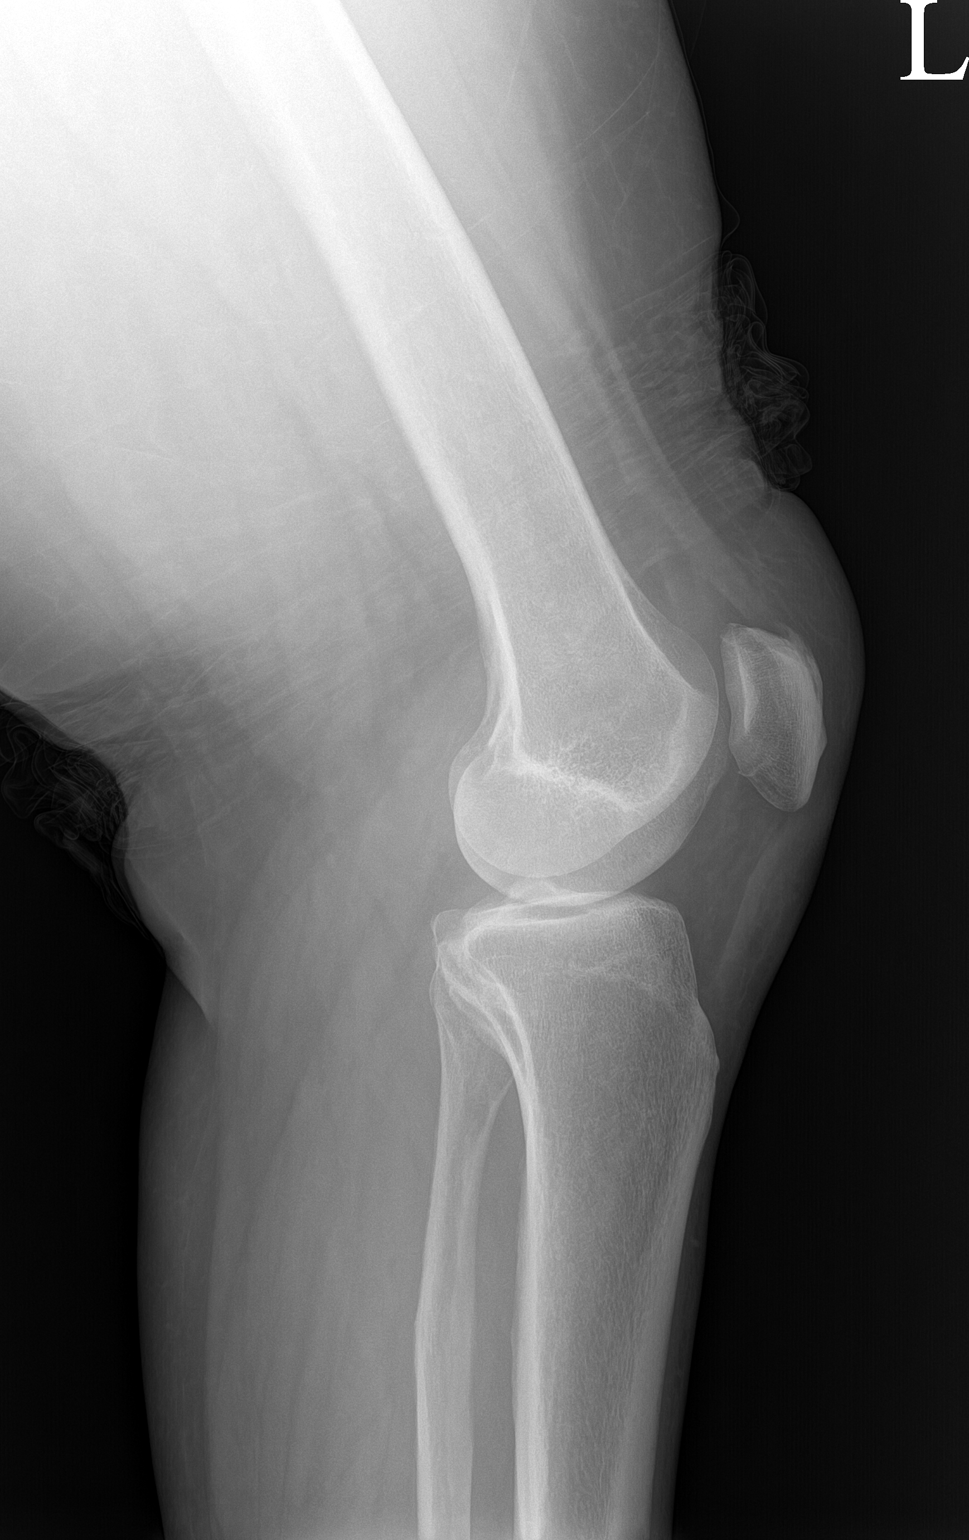

[patella]
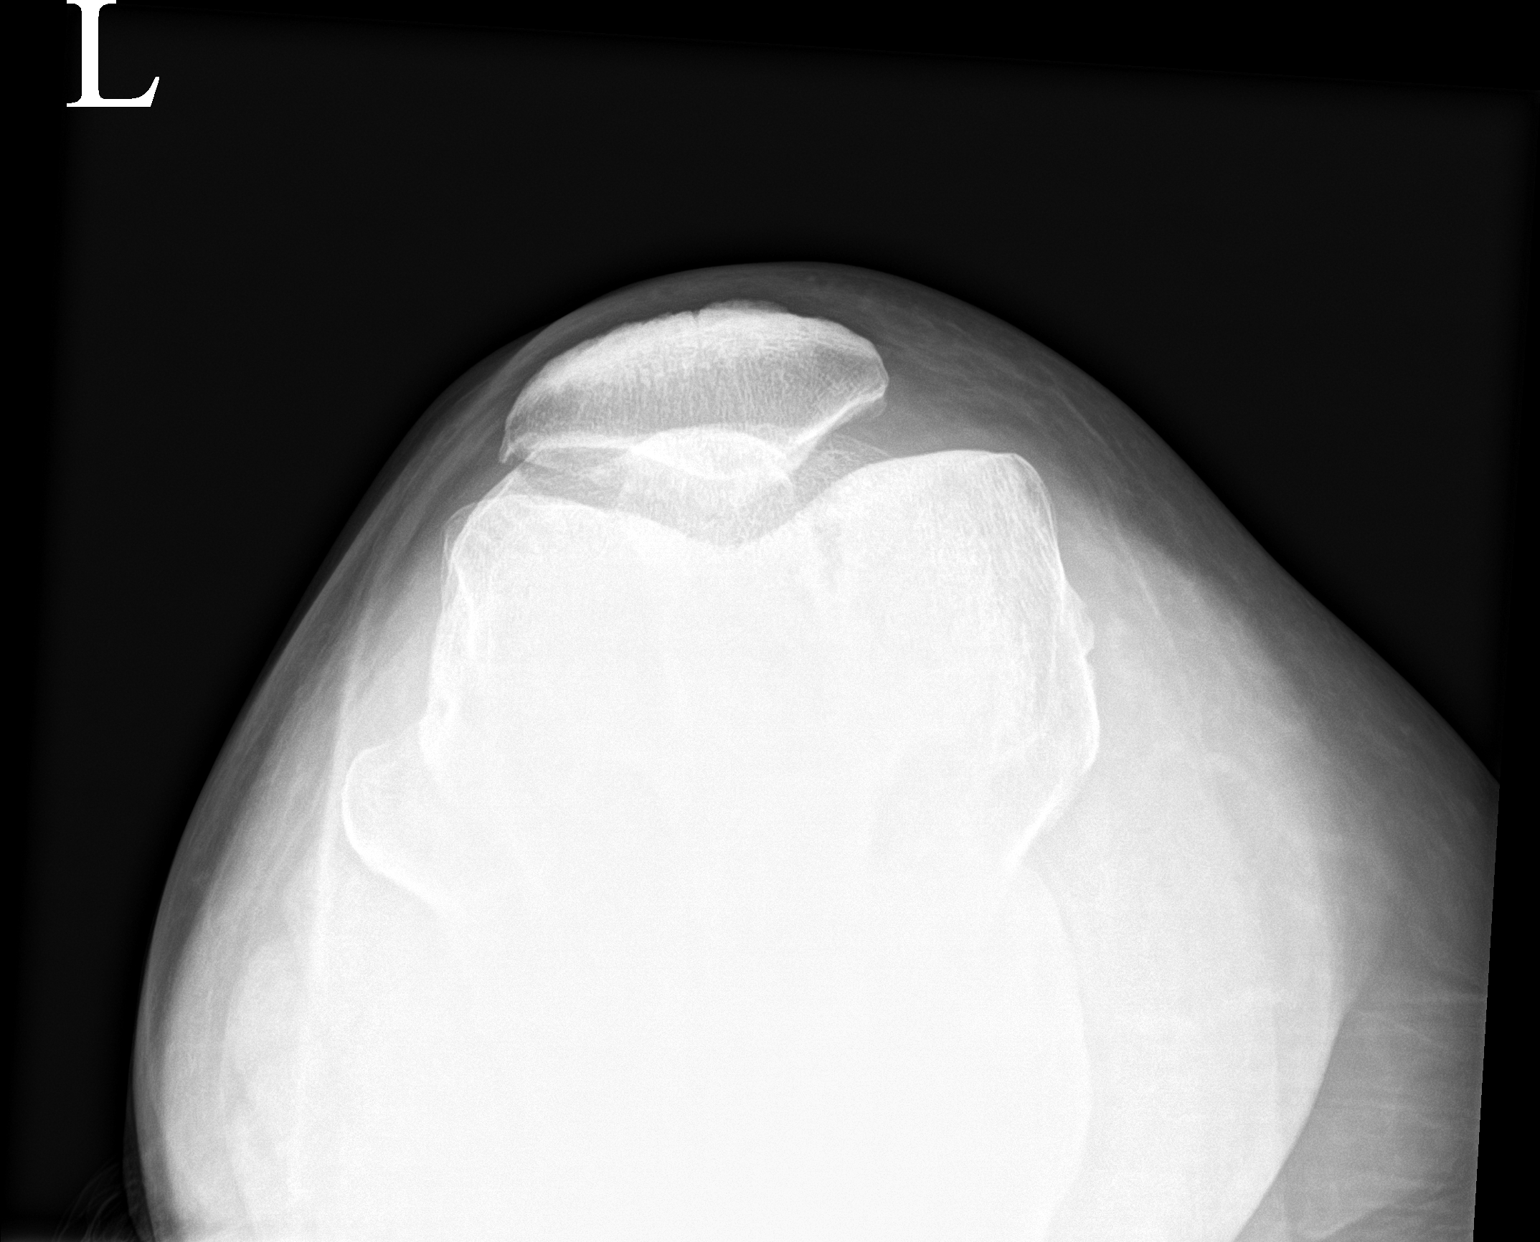

[3 of 3 positions shown; findings below may reference images not displayed]

FINDINGS: No acute fracture or dislocation. The bones are well mineralized.
Mild arthritic changes with mild narrowing of the medial
compartment. No joint effusion. The soft tissues are unremarkable.
IMPRESSION: No acute fracture or dislocation.

## 2021-05-26 NOTE — Progress Notes (Signed)
Subjective:    CC: L knee pain  I, Julie Hansen, LAT, ATC, am serving as scribe for Dr. [name].  HPI: Pt is a 50 y/o female presenting w/ c/o L knee pain x 2 months w/ no known MOI.  She locates her pain to her L ant-med knee.  L knee swelling: yes L knee mechanical symptoms: yes, intermittent locking Aggravating factors: laying on her L side in bed; walking; transitioning from sit-to-stand; prolonged standing Treatments tried: Meloxicam; Tylenol; Aleve; ice; knee brace  Pertinent review of Systems: No fevers or chills  Relevant historical information: Diabetes.  Patient works as a Equities trader at [employer].   Objective:    Vitals:   05/26/21 0811  BP: 110/78  Pulse: 86  SpO2: 96%   General: Well Developed, well nourished, and in no acute distress.   MSK: Left knee mild effusion otherwise normal-appearing Normal motion with minimal crepitation. Tender palpation medial joint line. Normal motion. Stable ligamentous exam.  Exam positive medial McMurray's test. Intact strength.  Lab and Radiology Results  Procedure: Real-time Ultrasound Guided Injection of left knee superior lateral patellar space Device: Philips Affiniti 50G Images permanently stored and available for review in PACS Ultrasound evaluation prior to injection reveals intact patellar tendon with small osteophyte superior patellar pole. Moderate joint effusion. Intact patellar tendon. Degenerative medial joint line with degenerative appearing medial meniscus. Normal lateral joint line. No Baker's cyst.  Verbal informed consent obtained.  Discussed risks and benefits of procedure. Warned about infection bleeding damage to structures skin hypopigmentation and fat atrophy among others. Patient expresses understanding and agreement Time-out conducted.   Noted no overlying erythema, induration, or other signs of local infection.   Skin prepped in a sterile fashion.   Local  anesthesia: Topical Ethyl chloride.   With sterile technique and under real time ultrasound guidance: 40 mg of Kenalog and 2 ml of Marcaine injected into knee joint. Fluid seen entering the Joint capsule.   Completed without difficulty   Pain immediately resolved suggesting accurate placement of the medication.   Advised to call if fevers/chills, erythema, induration, drainage, or persistent bleeding.   Images permanently stored and available for review in the ultrasound unit.  Impression: Technically successful ultrasound guided injection.    X-ray images left knee obtained today personally and independently interpreted Mild DJD medial compartment. Osteophyte superior patellar pole. No acute fractures. Await formal radiology review   Impression and Recommendations:    Assessment and Plan: 50 y.o. 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  Plan for steroid injection today, home exercise program taught in clinic today by ATC, and Voltaren gel.  Recheck back in about 6 weeks.  If not improving proceed to MRI or hyaluronic acid injection.Marland Kitchen  PDMP not reviewed this encounter. Orders Placed This Encounter  Procedures   [place] LIMITED JOINT SPACE STRUCTURES LOW LEFT(NO LINKED CHARGES)    Order Specific Question:   Reason for Exam (SYMPTOM  OR DIAGNOSIS REQUIRED)    Answer:   L knee pain    Order Specific Question:   Preferred imaging location?    Answer:   [facility]   DG Knee AP/LAT W/Sunrise Left    Standing Status:   Future    Number of Occurrences:   1    Standing Expiration Date:   06/25/2021    Order Specific Question:   Reason for Exam (SYMPTOM  OR DIAGNOSIS REQUIRED)    Answer:   L knee pain    Order Specific Question:   Is patient pregnant?    Answer:   No    Order Specific Question:   Preferred imaging location?    Answer:   [name]   No orders of the  defined types were placed in this encounter.   Discussed warning signs or symptoms. Please see discharge instructions. Patient expresses understanding.   The above documentation has been reviewed and is accurate and complete [name], M.D.

## 2021-05-26 NOTE — Patient Instructions (Signed)
Good to meet you today.  You had a L knee steroid injection.  Call or go to the ER if you develop a large red swollen joint with extreme pain or oozing puss.   Please use Voltaren gel (Generic Diclofenac Gel) up to 4x daily for pain as needed.  This is available over-the-counter as both the name brand Voltaren gel and the generic diclofenac gel.   Please perform the exercise program that we have prepared for you and gone over in detail on a daily basis.  In addition to the handout you were provided you can access your program through: [email or web address]   Your unique program code is:  [email or web address]  Follow-up in 6 weeks.

## 2021-05-30 ENCOUNTER — Other Ambulatory Visit (HOSPITAL_COMMUNITY): Payer: Self-pay

## 2021-05-30 NOTE — Progress Notes (Signed)
Left knee x-ray shows no fracture or dislocation.  Mild arthritis is present in the medial compartment of the knee.

## 2021-05-31 ENCOUNTER — Other Ambulatory Visit (HOSPITAL_COMMUNITY): Payer: Self-pay

## 2021-06-01 ENCOUNTER — Other Ambulatory Visit (HOSPITAL_COMMUNITY): Payer: Self-pay

## 2021-06-02 ENCOUNTER — Other Ambulatory Visit (HOSPITAL_COMMUNITY): Payer: Self-pay

## 2021-06-05 ENCOUNTER — Other Ambulatory Visit (HOSPITAL_COMMUNITY): Payer: Self-pay

## 2021-06-06 ENCOUNTER — Other Ambulatory Visit (HOSPITAL_COMMUNITY): Payer: Self-pay

## 2021-06-07 ENCOUNTER — Other Ambulatory Visit: Payer: Self-pay

## 2021-06-07 ENCOUNTER — Other Ambulatory Visit (HOSPITAL_COMMUNITY): Payer: Self-pay

## 2021-06-07 MED ORDER — ZOLPIDEM TARTRATE ER 12.5 MG PO TBCR
EXTENDED_RELEASE_TABLET | ORAL | 0 refills | Status: DC
  Filled 2021-06-07 (×2): qty 30, 30d supply, fill #0

## 2021-06-08 DIAGNOSIS — F411 Generalized anxiety disorder: Secondary | ICD-10-CM | POA: Diagnosis not present

## 2021-06-08 DIAGNOSIS — F3181 Bipolar II disorder: Secondary | ICD-10-CM | POA: Diagnosis not present

## 2021-06-09 ENCOUNTER — Other Ambulatory Visit: Payer: Self-pay

## 2021-06-09 MED ORDER — ZOLPIDEM TARTRATE ER 12.5 MG PO TBCR
EXTENDED_RELEASE_TABLET | ORAL | 0 refills | Status: DC
  Filled 2021-06-09: qty 90, 90d supply, fill #0
  Filled 2021-09-29: qty 90, fill #0

## 2021-06-09 MED ORDER — QUETIAPINE FUMARATE ER 300 MG PO TB24
ORAL_TABLET | ORAL | 1 refills | Status: DC
  Filled 2021-06-09: qty 180, 90d supply, fill #0
  Filled 2021-11-24: qty 60, 30d supply, fill #0
  Filled 2021-12-30: qty 60, 30d supply, fill #1
  Filled 2022-02-02: qty 60, 30d supply, fill #2
  Filled 2022-03-05: qty 60, 30d supply, fill #3
  Filled 2022-04-05: qty 60, 30d supply, fill #0

## 2021-06-09 MED ORDER — ALPRAZOLAM 0.25 MG PO TABS
ORAL_TABLET | ORAL | 0 refills | Status: DC
  Filled 2021-06-09 – 2021-06-22 (×2): qty 90, 90d supply, fill #0
  Filled 2021-09-29: qty 90, fill #0

## 2021-06-09 MED ORDER — HYDROXYZINE HCL 10 MG PO TABS
ORAL_TABLET | ORAL | 1 refills | Status: DC
  Filled 2021-06-09 – 2021-06-22 (×2): qty 180, 90d supply, fill #0
  Filled 2021-08-31: qty 180, 90d supply, fill #1

## 2021-06-12 ENCOUNTER — Telehealth: Payer: Self-pay | Admitting: Family Medicine

## 2021-06-12 ENCOUNTER — Other Ambulatory Visit: Payer: Self-pay

## 2021-06-12 DIAGNOSIS — M25562 Pain in left knee: Secondary | ICD-10-CM

## 2021-06-12 NOTE — Telephone Encounter (Signed)
Patient called to let Dr Georgina Snell know that she is still having pain. The injection helped for about 3 days and then the pain returned. She asked if an MRI could be ordered.

## 2021-06-14 ENCOUNTER — Other Ambulatory Visit (HOSPITAL_COMMUNITY): Payer: Self-pay

## 2021-06-16 NOTE — Telephone Encounter (Signed)
Left message letting patient know that MRI was ordered and gave [facility] phone number.

## 2021-06-22 ENCOUNTER — Other Ambulatory Visit (HOSPITAL_COMMUNITY): Payer: Self-pay | Admitting: Internal Medicine

## 2021-06-22 ENCOUNTER — Other Ambulatory Visit: Payer: Self-pay | Admitting: Physician Assistant

## 2021-06-22 ENCOUNTER — Other Ambulatory Visit (HOSPITAL_COMMUNITY): Payer: Self-pay

## 2021-06-22 ENCOUNTER — Ambulatory Visit: Payer: 59 | Admitting: Nutrition

## 2021-06-22 DIAGNOSIS — G43009 Migraine without aura, not intractable, without status migrainosus: Secondary | ICD-10-CM

## 2021-06-22 MED FILL — Ergocalciferol Cap 1.25 MG (50000 Unit): ORAL | 84 days supply | Qty: 24 | Fill #1 | Status: AC

## 2021-06-22 MED FILL — Fenofibrate Tab 48 MG: ORAL | 30 days supply | Qty: 30 | Fill #3 | Status: AC

## 2021-06-22 MED FILL — Chlorpromazine HCl Tab 25 MG: ORAL | 10 days supply | Qty: 30 | Fill #1 | Status: AC

## 2021-06-22 MED FILL — Insulin Lispro Soln Pen-injector 100 Unit/ML (1 Unit Dial): SUBCUTANEOUS | 20 days supply | Qty: 15 | Fill #3 | Status: AC

## 2021-06-22 MED FILL — Baclofen Tab 10 MG: ORAL | 13 days supply | Qty: 40 | Fill #3 | Status: AC

## 2021-06-23 ENCOUNTER — Other Ambulatory Visit (HOSPITAL_COMMUNITY): Payer: Self-pay

## 2021-06-23 MED ORDER — FLUTICASONE PROPIONATE 50 MCG/ACT NA SUSP
1.0000 | Freq: Every day | NASAL | 0 refills | Status: DC
  Filled 2021-06-23: qty 16, 60d supply, fill #0

## 2021-06-26 ENCOUNTER — Other Ambulatory Visit: Payer: 59

## 2021-06-26 ENCOUNTER — Other Ambulatory Visit (HOSPITAL_COMMUNITY): Payer: Self-pay

## 2021-06-28 ENCOUNTER — Other Ambulatory Visit (HOSPITAL_COMMUNITY): Payer: Self-pay

## 2021-06-28 ENCOUNTER — Ambulatory Visit: Payer: 59 | Admitting: Nutrition

## 2021-06-28 MED ORDER — OZEMPIC (1 MG/DOSE) 4 MG/3ML ~~LOC~~ SOPN
PEN_INJECTOR | SUBCUTANEOUS | 4 refills | Status: DC
  Filled 2021-06-28: qty 3, 28d supply, fill #0

## 2021-06-29 ENCOUNTER — Other Ambulatory Visit: Payer: Self-pay | Admitting: Physician Assistant

## 2021-06-29 ENCOUNTER — Other Ambulatory Visit (HOSPITAL_COMMUNITY): Payer: Self-pay

## 2021-06-29 DIAGNOSIS — G43009 Migraine without aura, not intractable, without status migrainosus: Secondary | ICD-10-CM

## 2021-06-29 MED ORDER — QUETIAPINE FUMARATE ER 300 MG PO TB24
ORAL_TABLET | ORAL | 1 refills | Status: DC
  Filled 2021-06-29: qty 180, 90d supply, fill #0
  Filled 2021-10-13: qty 60, 30d supply, fill #1

## 2021-06-29 MED ORDER — ALPRAZOLAM 0.25 MG PO TABS
ORAL_TABLET | ORAL | 1 refills | Status: DC
  Filled 2021-06-29: qty 30, 30d supply, fill #0
  Filled 2021-08-31: qty 30, 30d supply, fill #1

## 2021-06-29 MED ORDER — ZOLPIDEM TARTRATE ER 12.5 MG PO TBCR
EXTENDED_RELEASE_TABLET | ORAL | 0 refills | Status: DC
  Filled 2021-06-29 – 2021-07-07 (×2): qty 90, 90d supply, fill #0

## 2021-06-30 ENCOUNTER — Other Ambulatory Visit (HOSPITAL_COMMUNITY): Payer: Self-pay

## 2021-06-30 MED ORDER — PROMETHAZINE HCL 25 MG PO TABS
ORAL_TABLET | Freq: Four times a day (QID) | ORAL | 1 refills | Status: DC | PRN
Start: 2021-06-30 — End: 2021-09-08
  Filled 2021-06-30: qty 30, 7d supply, fill #0

## 2021-06-30 MED ORDER — GABAPENTIN 300 MG PO CAPS
300.0000 mg | ORAL_CAPSULE | Freq: Three times a day (TID) | ORAL | 2 refills | Status: DC
  Filled 2021-06-30: qty 180, 60d supply, fill #0

## 2021-06-30 MED ORDER — FARXIGA 10 MG PO TABS
ORAL_TABLET | ORAL | 4 refills | Status: DC
  Filled 2021-06-30 – 2021-08-15 (×2): qty 90, 90d supply, fill #0
  Filled 2021-11-16: qty 90, 90d supply, fill #1

## 2021-06-30 MED ORDER — OZEMPIC (1 MG/DOSE) 4 MG/3ML ~~LOC~~ SOPN: PEN_INJECTOR | SUBCUTANEOUS | 4 refills | Status: DC

## 2021-07-01 ENCOUNTER — Other Ambulatory Visit (HOSPITAL_COMMUNITY): Payer: Self-pay

## 2021-07-03 ENCOUNTER — Ambulatory Visit: Payer: 59 | Admitting: Nutrition

## 2021-07-03 ENCOUNTER — Other Ambulatory Visit (HOSPITAL_COMMUNITY): Payer: Self-pay

## 2021-07-04 ENCOUNTER — Ambulatory Visit
Admission: RE | Admit: 2021-07-04 | Discharge: 2021-07-04 | Disposition: A | Discharge status: Home / Self Care | Payer: 59 | Source: Ambulatory Visit | Attending: Family Medicine | Admitting: Family Medicine

## 2021-07-04 ENCOUNTER — Other Ambulatory Visit: Payer: Self-pay

## 2021-07-04 ENCOUNTER — Ambulatory Visit: Payer: 59 | Admitting: Nutrition

## 2021-07-04 ENCOUNTER — Other Ambulatory Visit (HOSPITAL_COMMUNITY): Payer: Self-pay

## 2021-07-04 DIAGNOSIS — M25562 Pain in left knee: Secondary | ICD-10-CM

## 2021-07-04 DIAGNOSIS — M25462 Effusion, left knee: Secondary | ICD-10-CM | POA: Diagnosis not present

## 2021-07-04 DIAGNOSIS — M1712 Unilateral primary osteoarthritis, left knee: Secondary | ICD-10-CM | POA: Diagnosis not present

## 2021-07-04 DIAGNOSIS — R600 Localized edema: Secondary | ICD-10-CM | POA: Diagnosis not present

## 2021-07-04 IMAGING — MR MR KNEE*L* W/O CM
4 of 6 series · 20 of 40 positions shown · non-contrast
Comparison: X-ray knee [DATE].

CLINICAL DATA: Patient complains of left medial/anterior knee pain
x 2 months. Patient reports previous steroid injection with 100%
relief for 2 weeks. No known injury. No history of surgery reported.

EXAM:
MRI OF THE LEFT KNEE WITHOUT CONTRAST
TECHNIQUE: Multiplanar, multisequence MR imaging of the knee was performed. No
intravenous contrast was administered.

[Series 3: T2 fat-sat · axial · 4.0mm · 0.50mm/px · z∈[-71,+24]mm · 3 of 24 slices shown (1 of 2)]
[im 5/24]
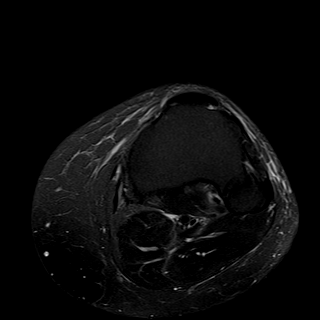
[im 14/24]
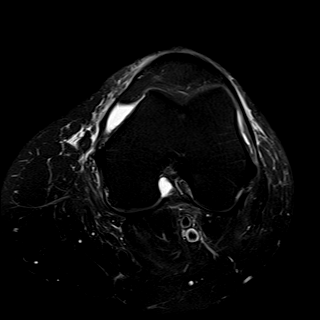
[im 24/24]
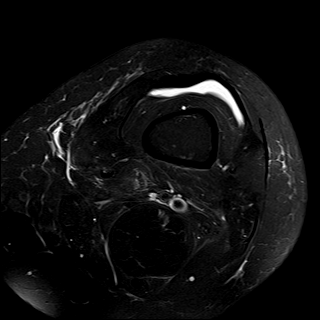

[Series 5: T2 fat-sat · coronal · 4.0mm · 0.29mm/px · 3 of 24 slices shown (2 of 2)]
[im 5/24]
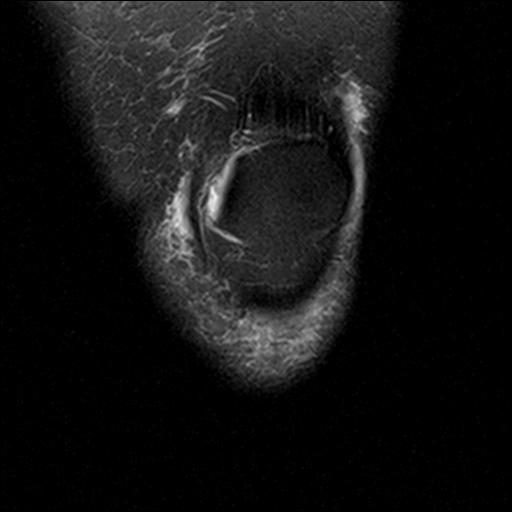
[im 14/24]
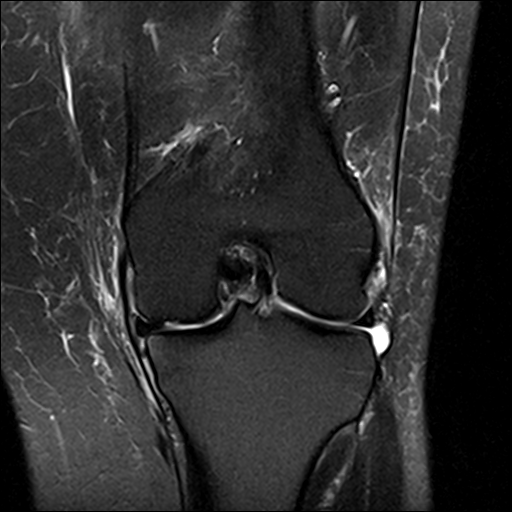
[im 24/24]
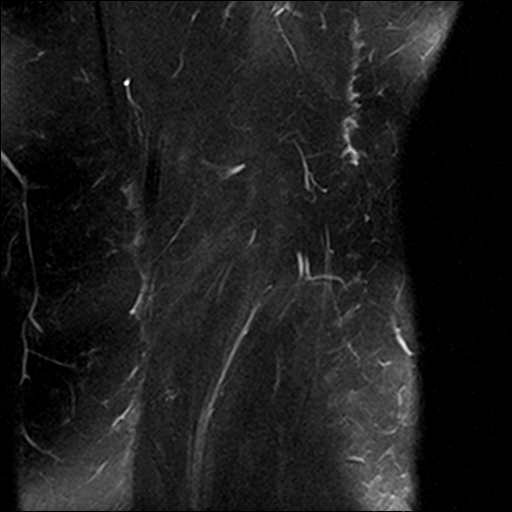

[Series 7: PD fat-sat · sagittal · 3.0mm · 0.29mm/px · 7 of 27 slices shown (1 of 2)]
[im 1/27]
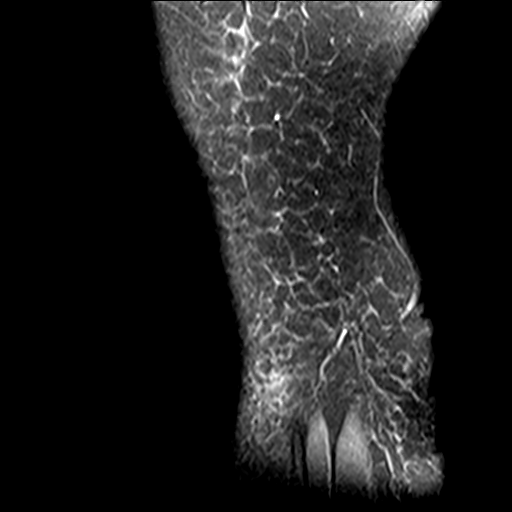
[im 5/27]
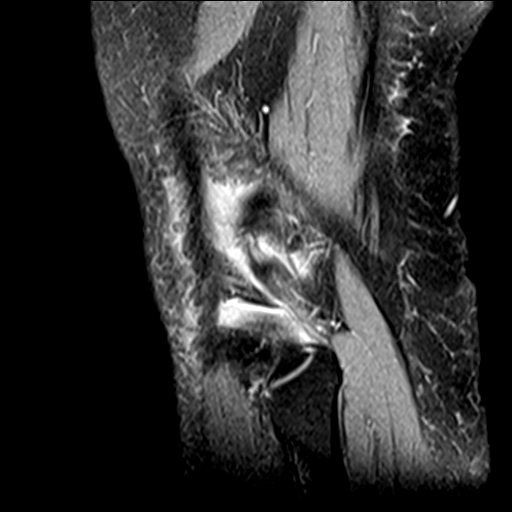
[im 9/27]
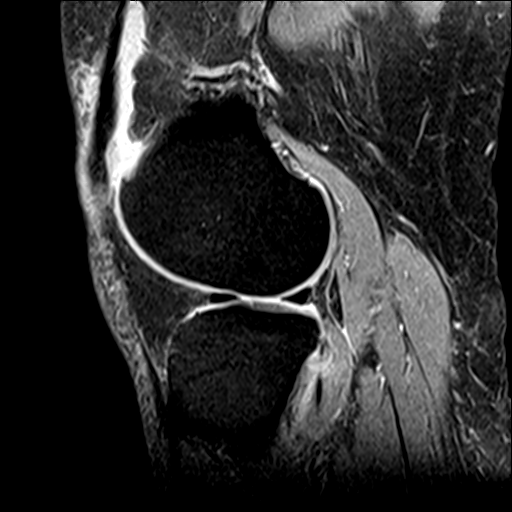
[im 14/27]
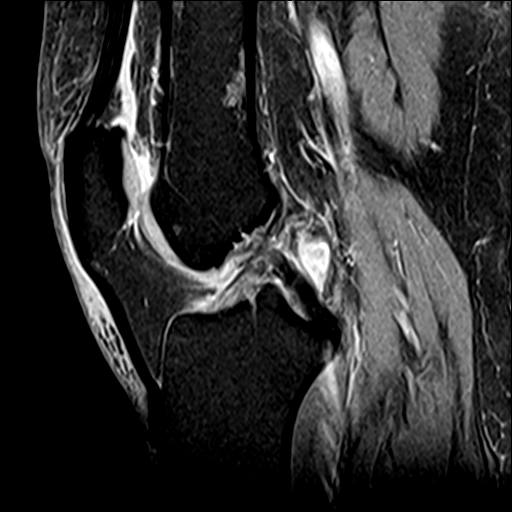
[im 18/27]
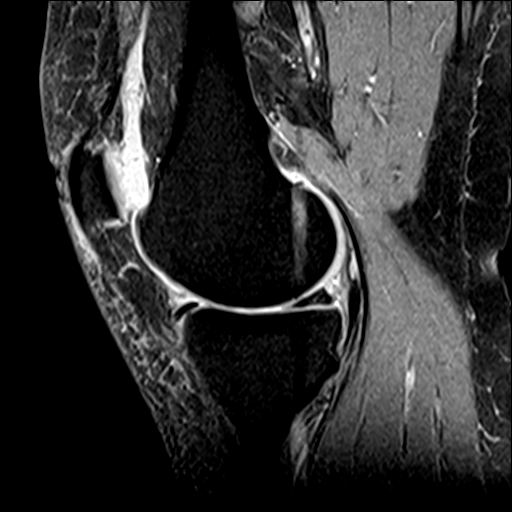
[im 22/27]
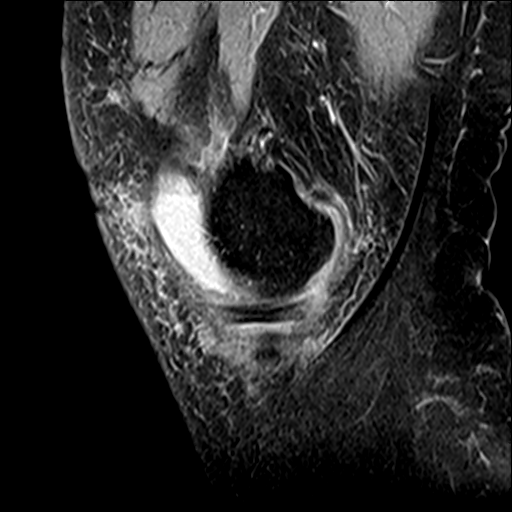
[im 27/27]
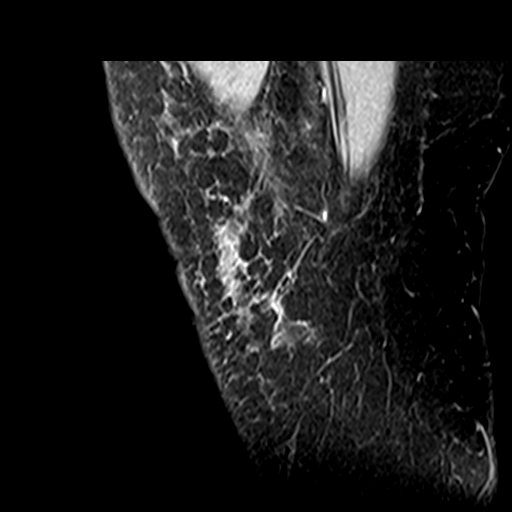

[Series 8: PD fat-sat · coronal · 3.0mm · 0.29mm/px · 7 of 31 slices shown (2 of 2)]
[im 1/31]
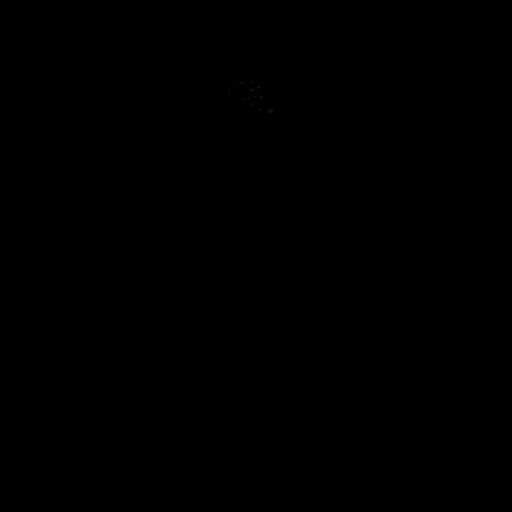
[im 5/31]
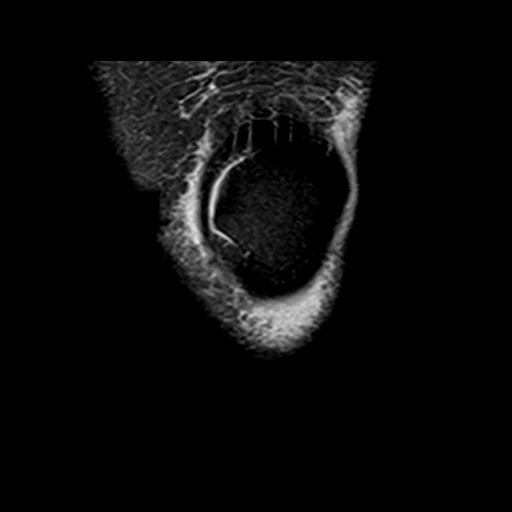
[im 9/31]
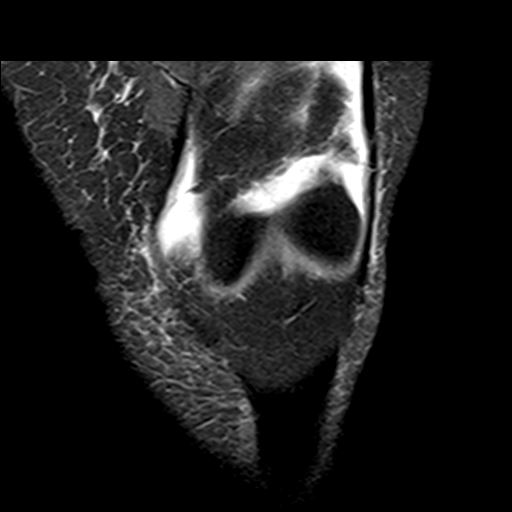
[im 13/31]
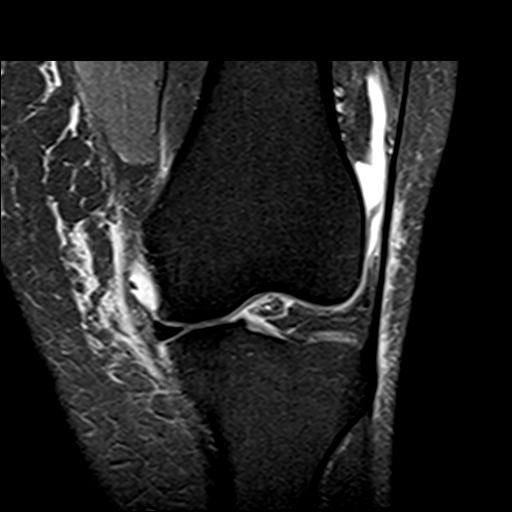
[im 18/31]
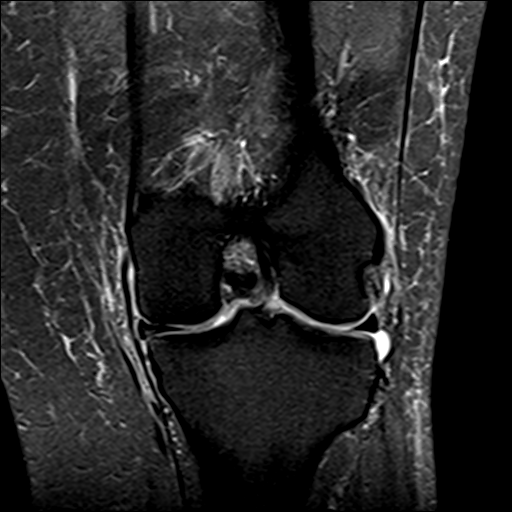
[im 22/31]
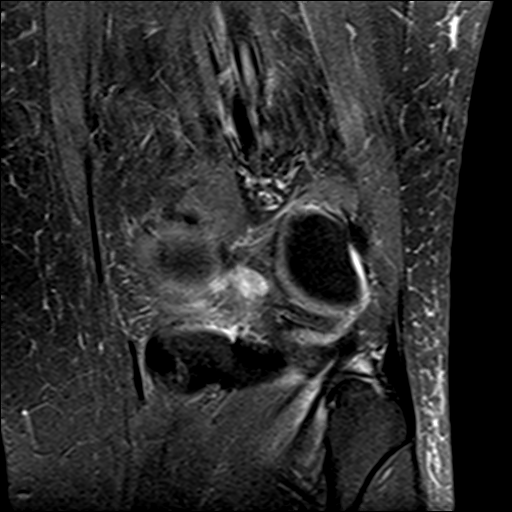
[im 26/31]
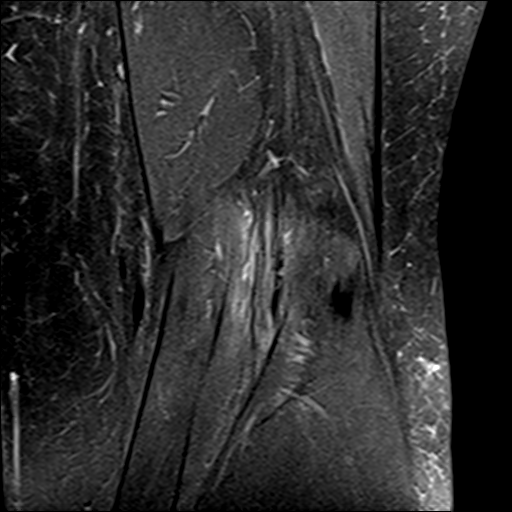

[20 of 40 positions shown; findings below may reference images not displayed]

FINDINGS: MENISCI

Medial meniscus: Degenerative undersurface tearing of the medial
meniscus centered at the posterior horn-body junction (series 7,
images 20-22).

Lateral meniscus: Intact.

LIGAMENTS

Cruciates: Intact ACL and PCL.

Collaterals: Medial collateral ligament is intact. Lateral
collateral ligament complex is intact.

CARTILAGE

Patellofemoral: No chondral defect.

Medial: Moderate chondral thinning and surface irregularity of the
weight-bearing medial compartment.

Lateral: No chondral defect.

Joint: Small-moderate knee joint effusion. Fat pads within normal
limits.

Popliteal Fossa: No Baker's cyst. Popliteus intramuscular edema with
intact tendon.

Extensor Mechanism: Intact quadriceps tendon and patellofemoral
tendon.

Bones: Mild reactive subchondral marrow edema at the peripheral
aspect of the medial tibial plateau. No subchondral fracture.
Osseous structures are otherwise within normal limits. No fracture.
No malalignment. No suspicious bone lesion.

Other: Subcutaneous edema, most pronounced anteriorly. No organized
fluid collections.
IMPRESSION: 1. Mild-moderate medial compartment osteoarthritis.
2. Degenerative undersurface tearing of the medial meniscus centered
at the posterior horn-body junction.
3. Mild popliteus muscle strain.
4. Small-moderate knee joint effusion.

## 2021-07-06 ENCOUNTER — Other Ambulatory Visit: Payer: Self-pay

## 2021-07-06 ENCOUNTER — Encounter (HOSPITAL_BASED_OUTPATIENT_CLINIC_OR_DEPARTMENT_OTHER): Payer: Self-pay

## 2021-07-06 ENCOUNTER — Encounter: Payer: Self-pay | Admitting: Nutrition

## 2021-07-06 ENCOUNTER — Encounter: Payer: 59 | Attending: Internal Medicine | Admitting: Nutrition

## 2021-07-06 VITALS — Ht 64.0 in | Wt 237.0 lb

## 2021-07-06 DIAGNOSIS — E118 Type 2 diabetes mellitus with unspecified complications: Secondary | ICD-10-CM

## 2021-07-06 DIAGNOSIS — E66812 Obesity, class 2: Secondary | ICD-10-CM

## 2021-07-06 DIAGNOSIS — E782 Mixed hyperlipidemia: Secondary | ICD-10-CM

## 2021-07-06 NOTE — Progress Notes (Signed)
Medical Nutrition Therapy  [employer] employee  ID [id number] First visit. Appointment Start time:  55  Appointment End time:  1400  Primary concerns today: DM Type 2  Referral diagnosis: E11.8 Preferred learning style: no preference  Learning readiness: Ready    NUTRITION ASSESSMENT  Sees Dr. Chalmers Cater in [facility] for Endocrinology. No longer hungry at night. Eating better balanced meals. Has been taking insulin after her meals instead of before. 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 Increased water intake Is trying to do the [employer] weight loss program. Has been taking steroid injections for knee problem and BS went up into the 300's. Torn meniscus. Just got MRI done. FBS: AVG 195 mg/dl. TIR 45 % OOR 60%  No hypoglycemia  Gained 3 lbs.  Anthropometrics  Wt Readings from Last 3 Encounters:  05/26/21 234 lb 12.8 oz (106.5 kg)  07/27/20 229 lb (103.9 kg)  06/28/20 224 lb 3.3 oz (101.7 kg)   Ht Readings from Last 3 Encounters:  05/26/21 5\' 4"  (1.626 m)  07/27/20 5\' 4"  (1.626 m)  06/28/20 5\' 4"  (1.626 m)   There is no height or weight on file to calculate BMI. @BMIFA @ Facility age limit for growth percentiles is 20 years. Facility age limit for growth percentiles is 20 years.   CMP Latest Ref Rng & Units 07/29/2020 06/23/2020 12/23/2019  Glucose 65 - 99 mg/dL 128(H) 159(H) 254(H)  BUN 6 - 24 mg/dL 10 11 14   Creatinine 0.57 - 1.00 mg/dL 0.95 0.87 0.92  Sodium 134 - 144 mmol/L 137 137 135  Potassium 3.5 - 5.2 mmol/L 4.2 3.8 3.5  Chloride 96 - 106 mmol/L 102 103 103  CO2 20 - 29 mmol/L 21 26 23   Calcium 8.7 - 10.2 mg/dL 9.4 9.2 8.8(L)  Total Protein 6.5 - 8.1 g/dL - - -  Total Bilirubin 0.3 - 1.2 mg/dL - - -  Alkaline Phos 38 - 126 U/L - - -  AST 15 - 41 U/L - - -  ALT 0 - 44 U/L - - -   Lab Results  Component Value Date   HGBA1C 7.9 (H) 12/23/2019     Clinical Medical Hx: Asthma, GERD, IBS, Hyperlipidemia,   Medications: ,Libre, Farxiga, Tricor,  Tresiba 26 units in am and 60 units in pm  daily, Humalog 20 units with meals plus SS, Ozempic, Vit D  Lab Results  Component Value Date   HGBA1C 7.9 (H) 12/23/2019   CMP Latest Ref Rng & Units 07/29/2020 06/23/2020 12/23/2019  Glucose 65 - 99 mg/dL 128(H) 159(H) 254(H)  BUN 6 - 24 mg/dL 10 11 14   Creatinine 0.57 - 1.00 mg/dL 0.95 0.87 0.92  Sodium 134 - 144 mmol/L 137 137 135  Potassium 3.5 - 5.2 mmol/L 4.2 3.8 3.5  Chloride 96 - 106 mmol/L 102 103 103  CO2 20 - 29 mmol/L 21 26 23   Calcium 8.7 - 10.2 mg/dL 9.4 9.2 8.8(L)  Total Protein 6.5 - 8.1 g/dL - - -  Total Bilirubin 0.3 - 1.2 mg/dL - - -  Alkaline Phos 38 - 126 U/L - - -  AST 15 - 41 U/L - - -  ALT 0 - 44 U/L - - -   Lipid Panel     Component Value Date/Time   CHOL 235 (H) 12/23/2019 0832   TRIG 428 (H) 12/23/2019 0832   HDL 60 12/23/2019 0832   CHOLHDL 3.9 12/23/2019 0832   VLDL UNABLE TO CALCULATE IF TRIGLYCERIDE  OVER 400 mg/dL 12/23/2019 0832   LDLCALC UNABLE TO CALCULATE IF TRIGLYCERIDE OVER 400 mg/dL 12/23/2019 0832   LDLDIRECT 121.5 (H) 12/23/2019 0832    Notable Signs/Symptoms: none  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 Works long shifts. 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   Estimated daily fluid intake: 48 oz Supplements: VIt D Sleep: good Stress / self-care: work and life Current average weekly physical activity: ADL  24-Hr Dietary Recall  Trying to eat better balanced meals Dinner: chicken, cabbage and sweet potato, water  Estimated Energy Needs Calories: 1200 Carbohydrate: 135g Protein: 90g Fat: 33g   NUTRITION DIAGNOSIS  NB-1.1 Food and nutrition-related knowledge deficit As related to Diabetes Type 2.  As evidenced by A1C 7.9% .   NUTRITION INTERVENTION  Nutrition education (E-1) on the following topi Nutrition and Diabetes education provided on My Plate, CHO counting, meal planning, portion sizes, timing of meals, avoiding snacks between meals unless  having a low blood sugar, target ranges for A1C and blood sugars, signs/symptoms and treatment of hyper/hypoglycemia, monitoring blood sugars, taking medications as prescribed, benefits of exercising 30 minutes per day and prevention of complications of DM.  Educated on using the [place] trends, graphs and TIR and evaluate what her eating pattern is doing.  Handouts Provided Include  MY Plate Meal Plan Card Diabetes Instructions.  Learning Style & Readiness for Change Teaching method utilized: Visual & Auditory  Demonstrated degree of understanding via: Teach Back  Barriers to learning/adherence to lifestyle change: none  Goals  Use eatwise app Taking insulin before meals Increase water intake to 100 oz. Aim to Get BS TIR 65%    MONITORING & EVALUATION Dietary intake, weekly physical activity, and blood sugars in 1 month.  Next Steps  Patient is to work on meal planning and eating meals on time.Marland Kitchen

## 2021-07-06 NOTE — Patient Instructions (Signed)
Goals  Use eatwise app Taking insulin before meals Increase water intake to 100 oz. Aim to Get BS TIR 65%

## 2021-07-07 ENCOUNTER — Other Ambulatory Visit (HOSPITAL_COMMUNITY): Payer: Self-pay

## 2021-07-08 ENCOUNTER — Other Ambulatory Visit (HOSPITAL_COMMUNITY): Payer: Self-pay

## 2021-07-10 NOTE — Progress Notes (Signed)
Left knee MRI reveals medium arthritis and a medial meniscus tear that appears to be degenerative as well as a popliteus muscle strain of the back of the knee. Recommend return to clinic in the near future to go over the results and discuss treatment plan and options going forward.

## 2021-07-13 ENCOUNTER — Other Ambulatory Visit: Payer: Self-pay

## 2021-07-13 ENCOUNTER — Ambulatory Visit (INDEPENDENT_AMBULATORY_CARE_PROVIDER_SITE_OTHER): Payer: 59 | Admitting: Family Medicine

## 2021-07-13 VITALS — BP 128/82 | HR 90 | Ht 64.0 in | Wt 235.0 lb

## 2021-07-13 DIAGNOSIS — M25562 Pain in left knee: Secondary | ICD-10-CM | POA: Diagnosis not present

## 2021-07-13 DIAGNOSIS — S83242A Other tear of medial meniscus, current injury, left knee, initial encounter: Secondary | ICD-10-CM | POA: Diagnosis not present

## 2021-07-13 NOTE — Progress Notes (Signed)
I, Julie Hansen, LAT, ATC acting as a scribe for Julie Leader, MD.  Julie Hansen is a 50 y.o. female who presents to [place] at [facility] today for f/u L knee pain and MRI review. Pt was last seen by Dr. Georgina Hansen on 05/26/21 and was given a L knee steroid injection, was advised to use Voltaren gel, and taught HEP. Pt called the office on 06/12/21 reporting her L knee pain returned about 3 days after and inquired if a MRI could be ordered. Today, pt reports anterior medial L knee pain. Pt notes the pain isn't constant, but varying.  She notes she will experience sharp medial knee pain with gait associate with a popping and clicking sensation.  She also notes she had hyperglycemia into the 300s following the steroid injection.  Dx imaging:07/04/21 L knee MRI  05/26/21 L knee XR  Pertinent review of systems: No fevers or chills  Relevant historical information: Controlled diabetes.   Exam:  BP 128/82   Pulse 90   Ht 5\' 4"  (1.626 m)   Wt 235 lb (106.6 kg)   SpO2 99%   BMI 40.34 kg/m  General: Well Developed, well nourished, and in no acute distress.   MSK: Left knee normal-appearing normal motion.  Normal gait.    Lab and Radiology Results  EXAM: MRI OF THE LEFT KNEE WITHOUT CONTRAST   TECHNIQUE: Multiplanar, multisequence MR imaging of the knee was performed. No intravenous contrast was administered.   COMPARISON:  X-ray knee 05/26/2021.   FINDINGS: MENISCI   Medial meniscus: Degenerative undersurface tearing of the medial meniscus centered at the posterior horn-body junction (series 7, images 20-22).   Lateral meniscus: Intact.   LIGAMENTS   Cruciates: Intact ACL and PCL.   Collaterals: Medial collateral ligament is intact. Lateral collateral ligament complex is intact.   CARTILAGE   Patellofemoral: No chondral defect.   Medial: Moderate chondral thinning and surface irregularity of the weight-bearing medial compartment.    Lateral: No chondral defect.   Joint: Small-moderate knee joint effusion. Fat pads within normal limits.   Popliteal Fossa: No Baker's cyst. Popliteus intramuscular edema with intact tendon.   Extensor Mechanism: Intact quadriceps tendon and patellofemoral tendon.   Bones: Mild reactive subchondral marrow edema at the peripheral aspect of the medial tibial plateau. No subchondral fracture. Osseous structures are otherwise within normal limits. No fracture. No malalignment. No suspicious bone lesion.   Other: Subcutaneous edema, most pronounced anteriorly. No organized fluid collections.   IMPRESSION: 1. Mild-moderate medial compartment osteoarthritis. 2. Degenerative undersurface tearing of the medial meniscus centered at the posterior horn-body junction. 3. Mild popliteus muscle strain. 4. Small-moderate knee joint effusion.     Electronically Signed   By: Davina Poke D.O.   On: 07/07/2021 15:14  I, Julie Hansen, personally (independently) visualized and performed the interpretation of the images attached in this note.       Assessment and Plan: 50 y.o. female with left medial knee pain.  Multifactorial.  Some of the pain is thought to be due to the DJD seen on MRI.  However I do think that some of the mechanical knee pain that she is experiencing with gait is due to the posterior medial meniscus tear seen on MRI.  She may be a surgical candidate especially for her mechanical knee pain.  Plan to refer to orthopedic surgery for surgical consultation. Additional work on authorization for hyaluronic acid injection.  If ultimately she orthopedic surgery decided that surgery is not  the best option next step would probably be hyaluronic acid injection which certainly I can offer her rapidly.    PDMP not reviewed this encounter. Orders Placed This Encounter  Procedures   Ambulatory referral to Orthopedic Surgery    Referral Priority:   Routine    Referral Type:    Surgical    Referral Reason:   Specialty Services Required    Requested Specialty:   Orthopedic Surgery    Number of Visits Requested:   1   No orders of the defined types were placed in this encounter.    Discussed warning signs or symptoms. Please see discharge instructions. Patient expresses understanding.   The above documentation has been reviewed and is accurate and complete Julie Hansen, M.D. Total encounter time 20 minutes including face-to-face time with the patient and, reviewing past medical record, and charting on the date of service.   Reviewed MRI discussed treatment plan and options

## 2021-07-13 NOTE — Patient Instructions (Addendum)
Thank you for coming in today.   I've referred you to [facility] for a surgical consultation. If you don't hear about scheduling within a week, please let me know.  We will work to authorize gel shots. You will hear from [facility] when she get's your insurances approval.

## 2021-07-14 ENCOUNTER — Encounter: Payer: 59 | Admitting: Physician Assistant

## 2021-07-17 ENCOUNTER — Other Ambulatory Visit (HOSPITAL_BASED_OUTPATIENT_CLINIC_OR_DEPARTMENT_OTHER): Payer: Self-pay

## 2021-07-18 ENCOUNTER — Ambulatory Visit: Payer: 59 | Admitting: Orthopaedic Surgery

## 2021-07-18 ENCOUNTER — Other Ambulatory Visit: Payer: Self-pay

## 2021-07-18 ENCOUNTER — Encounter: Payer: Self-pay | Admitting: Orthopaedic Surgery

## 2021-07-18 DIAGNOSIS — S838X2A Sprain of other specified parts of left knee, initial encounter: Secondary | ICD-10-CM | POA: Diagnosis not present

## 2021-07-18 NOTE — Progress Notes (Signed)
Office Visit Note   Patient: Julie Hansen           Date of Birth: [birth year]           MRN: [id number] Visit Date: 07/18/2021              Requested by: Gregor Hams, MD [place],  [place] [id number] PCP: Chesley Noon, MD   Assessment & Plan: Visit Diagnoses:  1. Acute medial meniscal injury of knee, left, initial encounter     Plan: impression is left medial meniscus tear.  She understands she does have OA but this is relatively mild and should respond to conservative management for now but the majority of symptoms are from the MMT.  I have recommended arthroscopic PMM and chondroplasty.  I have also recommended PT and weight loss.  Risks, benefits, alternatives to surgery reviewed.  Time out of work also discussed. Last A1c was 9.1 so will await next result due in late November.  Will go ahead and schedule for early December assuming A1c is acceptable.    Follow-Up Instructions: No follow-ups on file.   Orders:  No orders of the defined types were placed in this encounter.  No orders of the defined types were placed in this encounter.     Procedures: No procedures performed   Clinical Data: No additional findings.   Subjective: Chief Complaint  Patient presents with   Left Knee - Pain    Julie Hansen is a 50 year old female works for [employer] system referral from Dr. Georgina Snell for left MMT found on recent MRI.  She's had pain since June 2022.  Denies any injuries.  She's reporting a tear razor sharp pain on the medial side of the knee with twisting and certain movements.  Also has some anterior knee pain that is chronic and achy.  She had cortisone injection in early September with complete relief for 2 weeks but symptoms returned.  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     Review of Systems  Constitutional: Negative.   HENT: Negative.    Eyes: Negative.   Respiratory: Negative.    Cardiovascular: Negative.    Endocrine: Negative.   Musculoskeletal: Negative.   Neurological: Negative.   Hematological: Negative.   Psychiatric/Behavioral: Negative.    All other systems reviewed and are negative.   Objective: Vital Signs: There were no vitals taken for this visit.  Physical Exam Vitals and nursing note reviewed.  Constitutional:      Appearance: She is well-developed.  Pulmonary:     Effort: Pulmonary effort is normal.  Skin:    General: Skin is warm.     Capillary Refill: Capillary refill takes less than 2 seconds.  Neurological:     Mental Status: She is alert and oriented to person, place, and time.  Psychiatric:        Behavior: Behavior normal.        Thought Content: Thought content normal.        Judgment: Judgment normal.    Ortho Exam Left knee  - moderate effusion - pain with flexion past 100 degrees - medial joint line tenderness with pain with mcmurray testing - stable varus/valgus  Specialty Comments:  No specialty comments available.  Imaging: No results found.   PMFS History: Patient Active Problem List   Diagnosis Date Noted   Acute medial meniscal injury of knee, left, initial encounter 07/18/2021   Chronic migraine without aura without status  migrainosus, not intractable 04/14/2021   Muscle spasm 06/20/2018   Unspecified dyspareunia (CODE) 03/20/2017   Amenorrhea 03/20/2017   Irritable bowel syndrome 01/01/2017   Status migrainosus 01/01/2017   Anxiety 01/01/2017   Vaginal discharge 12/17/2016   GERD (gastroesophageal reflux disease) 10/30/2016   Mixed hyperlipidemia 10/30/2016   Diabetes type 2, controlled ([place]) 09/12/2015   OSA (obstructive sleep apnea) 02/16/2015   Asthma 10/29/2011   Past Medical History:  Diagnosis Date   Acid reflux    Asthma    Diabetes mellitus without complication (HCC)    Hyperlipidemia    PCOS (polycystic ovarian syndrome)    PONV (postoperative nausea and vomiting)    Sleep apnea    uses cpap    Family  History  Problem Relation Age of Onset   Cancer Mother    Hypertension Mother    Diabetes Mother    Breast cancer Mother 66   Cancer Maternal Grandmother    Breast cancer Maternal Grandmother 41   Cancer Father    Diabetes Father    Hypertension Father    Breast cancer Paternal Aunt 49   Breast cancer Paternal Aunt 59   Breast cancer Paternal Aunt 15    Past Surgical History:  Procedure Laterality Date   BREAST EXCISIONAL BIOPSY Right 06/28/2020   papilloma   DILATION AND EVACUATION N/A 06/05/2017   Procedure: DILATATION AND EVACUATION;  Surgeon: Sanjuana Kava, MD;  Location: [facility];  Service: Gynecology;  Laterality: N/A;   RADIOACTIVE SEED GUIDED EXCISIONAL BREAST BIOPSY Right 06/28/2020   Procedure: RIGHT RADIOACTIVE SEED GUIDED EXCISIONAL BREAST BIOPSY;  Surgeon: Rolm Bookbinder, MD;  Location: [facility];  Service: General;  Laterality: Right;   WISDOM TOOTH EXTRACTION     Social History   Occupational History   Not on file  Tobacco Use   Smoking status: Never   Smokeless tobacco: Never  Vaping Use   Vaping Use: Never used  Substance and Sexual Activity   Alcohol use: Yes    Comment: occas wine   Drug use: No   Sexual activity: Yes    Birth control/protection: None

## 2021-07-25 DIAGNOSIS — E119 Type 2 diabetes mellitus without complications: Secondary | ICD-10-CM | POA: Diagnosis not present

## 2021-07-25 DIAGNOSIS — R0609 Other forms of dyspnea: Secondary | ICD-10-CM | POA: Diagnosis not present

## 2021-07-25 DIAGNOSIS — K219 Gastro-esophageal reflux disease without esophagitis: Secondary | ICD-10-CM | POA: Diagnosis not present

## 2021-07-25 DIAGNOSIS — E782 Mixed hyperlipidemia: Secondary | ICD-10-CM | POA: Diagnosis not present

## 2021-07-25 DIAGNOSIS — Z794 Long term (current) use of insulin: Secondary | ICD-10-CM | POA: Diagnosis not present

## 2021-07-25 DIAGNOSIS — I10 Essential (primary) hypertension: Secondary | ICD-10-CM | POA: Diagnosis not present

## 2021-08-04 ENCOUNTER — Other Ambulatory Visit (HOSPITAL_COMMUNITY): Payer: Self-pay

## 2021-08-07 ENCOUNTER — Other Ambulatory Visit (HOSPITAL_COMMUNITY)
Admission: RE | Admit: 2021-08-07 | Discharge: 2021-08-07 | Disposition: A | Discharge status: Home / Self Care | Payer: 59 | Source: Ambulatory Visit | Attending: Endocrinology | Admitting: Endocrinology

## 2021-08-07 DIAGNOSIS — E7800 Pure hypercholesterolemia, unspecified: Secondary | ICD-10-CM | POA: Diagnosis not present

## 2021-08-07 DIAGNOSIS — E1165 Type 2 diabetes mellitus with hyperglycemia: Secondary | ICD-10-CM | POA: Insufficient documentation

## 2021-08-07 LAB — COMPREHENSIVE METABOLIC PANEL
ALT: 10 U/L (ref 0–44)
AST: 11 U/L — ABNORMAL LOW (ref 15–41)
Albumin: 3.2 g/dL — ABNORMAL LOW (ref 3.5–5.0)
Alkaline Phosphatase: 96 U/L (ref 38–126)
Anion gap: 6 (ref 5–15)
BUN: 13 mg/dL (ref 6–20)
CO2: 25 mmol/L (ref 22–32)
Calcium: 8.9 mg/dL (ref 8.9–10.3)
Chloride: 104 mmol/L (ref 98–111)
Creatinine, Ser: 0.72 mg/dL (ref 0.44–1.00)
GFR, Estimated: >60 mL/min (ref >60)
Glucose, Bld: 165 mg/dL — ABNORMAL HIGH (ref 70–99)
Potassium: 3.9 mmol/L (ref 3.5–5.1)
Sodium: 135 mmol/L (ref 135–145)
Total Bilirubin: 0.2 mg/dL — ABNORMAL LOW (ref 0.3–1.2)
Total Protein: 7.1 g/dL (ref 6.5–8.1)

## 2021-08-07 LAB — LIPID PANEL
Cholesterol: 154 mg/dL (ref 0–200)
HDL: 58 mg/dL (ref >40)
LDL Cholesterol: 59 mg/dL (ref 0–99)
Total CHOL/HDL Ratio: 2.7 RATIO
Triglycerides: 187 mg/dL — ABNORMAL HIGH (ref <150)
VLDL: 37 mg/dL (ref 0–40)

## 2021-08-07 LAB — HEMOGLOBIN A1C
Hgb A1c MFr Bld: 9.2 % — ABNORMAL HIGH (ref 4.8–5.6)
Mean Plasma Glucose: 217.34 mg/dL

## 2021-08-07 LAB — TSH: TSH: 1.735 u[IU]/mL (ref 0.350–4.500)

## 2021-08-08 LAB — MICROALBUMIN / CREATININE URINE RATIO
Creatinine, Urine: 70.6 mg/dL
Microalb Creat Ratio: <4 mg/g creat (ref 0–29)
Microalb, Ur: <3 ug/mL — ABNORMAL HIGH

## 2021-08-09 ENCOUNTER — Encounter: Payer: Self-pay | Admitting: *Deleted

## 2021-08-09 ENCOUNTER — Encounter: Payer: Self-pay | Admitting: Orthopaedic Surgery

## 2021-08-10 ENCOUNTER — Other Ambulatory Visit: Payer: Self-pay | Admitting: Physician Assistant

## 2021-08-10 ENCOUNTER — Other Ambulatory Visit (HOSPITAL_COMMUNITY): Payer: Self-pay

## 2021-08-10 ENCOUNTER — Other Ambulatory Visit: Payer: Self-pay

## 2021-08-10 DIAGNOSIS — E1165 Type 2 diabetes mellitus with hyperglycemia: Secondary | ICD-10-CM | POA: Diagnosis not present

## 2021-08-10 DIAGNOSIS — G4733 Obstructive sleep apnea (adult) (pediatric): Secondary | ICD-10-CM | POA: Diagnosis not present

## 2021-08-10 DIAGNOSIS — I10 Essential (primary) hypertension: Secondary | ICD-10-CM | POA: Diagnosis not present

## 2021-08-10 DIAGNOSIS — E7800 Pure hypercholesterolemia, unspecified: Secondary | ICD-10-CM | POA: Diagnosis not present

## 2021-08-10 DIAGNOSIS — G43909 Migraine, unspecified, not intractable, without status migrainosus: Secondary | ICD-10-CM | POA: Diagnosis not present

## 2021-08-10 MED ORDER — TRESIBA FLEXTOUCH 200 UNIT/ML ~~LOC~~ SOPN
PEN_INJECTOR | SUBCUTANEOUS | 6 refills | Status: DC
  Filled 2021-08-10: qty 30, 56d supply, fill #0
  Filled 2022-03-05: qty 15, 28d supply, fill #1
  Filled 2022-03-21: qty 30, 56d supply, fill #1
  Filled 2022-07-09: qty 30, 59d supply, fill #2

## 2021-08-10 MED ORDER — MOUNJARO 5 MG/0.5ML ~~LOC~~ SOAJ
SUBCUTANEOUS | 2 refills | Status: DC
  Filled 2021-08-10 (×2): qty 2, 28d supply, fill #0
  Filled 2021-09-01: qty 2, 28d supply, fill #1

## 2021-08-11 ENCOUNTER — Encounter (HOSPITAL_BASED_OUTPATIENT_CLINIC_OR_DEPARTMENT_OTHER): Payer: Self-pay | Admitting: Cardiology

## 2021-08-11 ENCOUNTER — Other Ambulatory Visit: Payer: Self-pay

## 2021-08-11 ENCOUNTER — Ambulatory Visit (HOSPITAL_BASED_OUTPATIENT_CLINIC_OR_DEPARTMENT_OTHER): Payer: 59 | Admitting: Cardiology

## 2021-08-11 ENCOUNTER — Other Ambulatory Visit (HOSPITAL_COMMUNITY): Payer: Self-pay

## 2021-08-11 VITALS — BP 110/72 | HR 79 | Ht 64.0 in | Wt 236.0 lb

## 2021-08-11 DIAGNOSIS — R0602 Shortness of breath: Secondary | ICD-10-CM

## 2021-08-11 DIAGNOSIS — Z7189 Other specified counseling: Secondary | ICD-10-CM

## 2021-08-11 DIAGNOSIS — I479 Paroxysmal tachycardia, unspecified: Secondary | ICD-10-CM

## 2021-08-11 DIAGNOSIS — R072 Precordial pain: Secondary | ICD-10-CM | POA: Diagnosis not present

## 2021-08-11 DIAGNOSIS — R002 Palpitations: Secondary | ICD-10-CM

## 2021-08-11 MED ORDER — METOPROLOL TARTRATE 50 MG PO TABS
ORAL_TABLET | ORAL | 0 refills | Status: DC
  Filled 2021-08-11: qty 1, 1d supply, fill #0

## 2021-08-11 MED FILL — Lasmiditan Succinate Tab 100 MG: ORAL | 30 days supply | Qty: 8 | Fill #0 | Status: AC

## 2021-08-11 NOTE — Progress Notes (Signed)
Cardiology Office Note:    Date:  08/11/2021   ID:  Julie Hansen, DOB [birth year], MRN [id number]  PCP:  Chesley Noon, MD  Cardiologist:  Buford Dresser, MD  Referring MD: Heber Chili, MD   CC: follow up   History of Present Illness:    Julie Hansen is a 50 y.o. female with a hx of headache, breast masses who is seen for follow-up. I initially met her 07/27/2020 as a new consult at the request of Heber [place], MD for the evaluation and management of shortness of breath and tachycardia/palpitations.  -Prior cardiac history: filled up with fluid after her son was born. Had an echo at that time.  -Prior workup: had ETT done at [facility] 05/15/2019. I cannot see full result, but comment notes it as normal  Today: Overall, she is feeling good. However, she is suffering from random episodes of chest discomfort that she is not sure if caused by chest pain, indigestion, or something else.  During these episodes, she feels a lot of pressure in her central chest, but not necessarily pain. She will also describes this as a squeezing and tightness in her chest, and it may radiate to her right shoulder. The episodes are more noticeable when she is relaxing, very few times they have occurred when she was busy and caused her to stop. Typically her chest discomfort will last for 5-10 minutes, but does not occur too often. There are no known triggers, and no correlations with food.  With her chest discomfort episodes, she sometimes has associated palpitations and shortness of breath. Her palpitations also may occur independently 2-3 times a week, randomly at rest or while walking. Her shortness of breath is also noted with exertion.  Also, she has dizzy spells associated with white floaters, but also notes her A1C is elevated at 9.2   She endorses mild LE edema.  Yesterday her Tyler Aas was changed to 32 units in the AM and 74 units in the PM.  She denies any headaches,  syncope, orthopnea, or PND.  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   DILATION AND EVACUATION N/A 06/05/2017   Procedure: DILATATION AND EVACUATION;  Surgeon: Sanjuana Kava, MD;  Location: [facility];  Service: Gynecology;  Laterality: N/A;   RADIOACTIVE SEED GUIDED EXCISIONAL BREAST BIOPSY Right 06/28/2020   Procedure: RIGHT RADIOACTIVE SEED GUIDED EXCISIONAL BREAST BIOPSY;  Surgeon: Rolm Bookbinder, MD;  Location: [facility];  Service: General;  Laterality: Right;   WISDOM TOOTH EXTRACTION      Current Medications: Current Outpatient Medications on File Prior to Visit  Medication Sig   albuterol (PROVENTIL) (2.5 MG/3ML) 0.083% nebulizer solution Take 2.5 mg by nebulization every 6 (six) hours as needed.   ALPRAZolam (XANAX) 0.25 MG tablet Take 1 tablet by mouth daily as needed   atorvastatin (LIPITOR) 40 MG tablet Take one tablet (40 mg dose) by mouth daily.   baclofen (LIORESAL) 10 MG tablet TAKE 1 TABLET BY MOUTH DAILY AS NEEDED UP TO 3 TIMES DAILY   beclomethasone (QVAR REDIHALER) 40 MCG/ACT inhaler Inhale 2 puffs into the lungs 2 times daily  cetirizine (ZYRTEC ALLERGY) 10 MG tablet Take 1 tablet (10 mg total) by mouth daily.   chlorproMAZINE (THORAZINE) 25 MG tablet TAKE 1 TABLET BY MOUTH AS NEEDED FOR HEADACHE   Continuous Blood Gluc Receiver (DEXCOM G6 RECEIVER) DEVI as directed   Continuous Blood Gluc Sensor (FREESTYLE LIBRE 2 SENSOR) MISC APPLY AS DIRECTED TO MONITOR BLOOD SUGAR EVERY 14 DAYS   Continuous Blood Gluc Sensor (FREESTYLE LIBRE SENSOR SYSTEM) MISC Use as directed every 14 days   Continuous Blood Gluc Transmit (DEXCOM G6 TRANSMITTER) MISC use  every 90 days   dapagliflozin propanediol (FARXIGA) 10 MG TABS tablet Take 1 tablet by mouth daily   dexlansoprazole (DEXILANT) 60 MG capsule Take 1 capsule (60 mg total) by mouth daily 20 minutes before breakfast.   fenofibrate (TRICOR) 48 MG tablet TAKE ONE TABLET BY MOUTH DAILY   fluticasone (FLONASE) 50 MCG/ACT nasal spray USE 1 SPRAY INTO BOTH NOSTRILS DAILY.   gabapentin (NEURONTIN) 300 MG capsule Take 1 capsule (300 mg total) by mouth 3 (three) times daily.   glucose blood test strip Use to check blood sugar 3 times a day   hydrOXYzine (ATARAX/VISTARIL) 10 MG tablet Take 2 tablets by mouth at bedtime   ibuprofen (ADVIL,MOTRIN) 800 MG tablet Take 1 tablet (800 mg total) by mouth every 8 (eight) hours as needed.   insulin degludec (TRESIBA FLEXTOUCH) 200 UNIT/ML FlexTouch Pen Inject into the skin 32 units in the morning and 74 units in the evening   insulin lispro (HUMALOG) 100 UNIT/ML KwikPen INJECT 20 - 25 UNITS SUBCUTANEOUS 3 TIMES A DAY   Insulin Pen Needle 32G X 4 MM MISC USE AS DIRECTED 4 TIMES A DAY   Lasmiditan Succinate (REYVOW) 100 MG TABS Take 2 tablets (200 mg) by mouth as needed for headache   linaclotide (LINZESS) 290 MCG CAPS capsule Take 1 capsule by mouth 30 minutes before the first meal   lisinopril (ZESTRIL) 5 MG tablet Take one tablet (5 mg dose) by mouth daily.   meloxicam (MOBIC) 15 MG tablet Take 1 tablet (15 mg total) by mouth daily with a meal   promethazine (PHENERGAN) 25 MG tablet TAKE 1 TABLET BY MOUTH EVERY 6 HOURS AS NEEDED.   QUEtiapine (SEROQUEL XR) 300 MG 24 hr tablet Take 2 every evening   Semaglutide (OZEMPIC, 1 MG/DOSE, [place]) Inject into the skin.   Semaglutide, 1 MG/DOSE, (OZEMPIC, 1 MG/DOSE,) 4 MG/3ML SOPN Inject 61m subcutaneously once a week   tirzepatide (MOUNJARO) 5 MG/0.5ML Pen Inject 5 mg into the skin once a week   traMADol (ULTRAM) 50 MG tablet take one tablet by mouth every 6 hours as needed for pain   Vitamin D, Ergocalciferol, (DRISDOL) 1.25  MG (50000 UNIT) CAPS capsule TAKE ONE CAPSULE BY MOUTH TWO TIMES A WEEK   zolpidem (AMBIEN CR) 12.5 MG CR tablet Take 1 by mouth at bedtime   ALPRAZolam (XANAX) 0.25 MG tablet Take 1 tablet by mouth daily as needed (Patient not taking: Reported on 08/11/2021)   ALPRAZolam (XANAX) 0.25 MG tablet Take 1 tablet by mouth daily as needed (Patient not taking: Reported on 08/11/2021)   Continuous Blood Gluc Sensor (DEXCOM G6 SENSOR) MISC use as directed subcutaneous every 10 days (Patient not taking: Reported on 08/11/2021)   dapagliflozin propanediol (FARXIGA) 10 MG TABS tablet Take 1 tablet by mouth daily (Patient not taking: Reported on 08/11/2021)   insulin degludec (TRESIBA) 200 UNIT/ML FlexTouch Pen Inject 26 units into the skin in the morning and 60 units in  the evening (Patient not taking: Reported on 08/11/2021)   linaclotide (LINZESS) 290 MCG CAPS capsule Take 1 capsule by mouth 30 minutes before the first meal (Patient not taking: Reported on 08/11/2021)   QUEtiapine (SEROQUEL XR) 300 MG 24 hr tablet Take 2 tablets by mouth in the evening (Patient not taking: Reported on 08/11/2021)   Semaglutide, 1 MG/DOSE, (OZEMPIC, 1 MG/DOSE,) 4 MG/3ML SOPN Inject 68m subcutaneously once a week. (Patient not taking: Reported on 08/11/2021)   zolpidem (AMBIEN CR) 12.5 MG CR tablet Take 1 tablet by mouth at bedtime. (Patient not taking: Reported on 08/11/2021)   No current facility-administered medications on file prior to visit.     Allergies:   Macrodantin [nitrofurantoin macrocrystal] and Carbamazepine   Social History   Tobacco Use   Smoking status: Never   Smokeless tobacco: Never  Vaping Use   Vaping Use: Never used  Substance Use Topics   Alcohol use: Yes    Comment: occas wine   Drug use: No    Family History: family history includes Breast cancer (age of onset: 461 in her mother; Breast cancer (age of onset: 561 in her maternal grandmother, paternal aunt, paternal aunt, and paternal aunt;  Cancer in her father, maternal grandmother, and mother; Diabetes in her father and mother; Hypertension in her father and mother. cousin died of MI at age 49 Pat aunt had open heart surgery. Both parents have diabetes, high blood pressure, no heart issues.  ROS:   Please see the history of present illness. (+) Chest pain/tightness, radiates to right shoulder (+) Palpitations (+) Shortness of breath (+) Dizzy spells (+) LE edema All other systems are reviewed and negative.    EKGs/Labs/Other Studies Reviewed:    The following studies were reviewed today:  Monitor 07/2020: 4 days of data recorded on Zio monitor. Patient had a min HR of 58 bpm, max HR of 136 bpm, and avg HR of 99 bpm. Predominant underlying rhythm was Sinus Rhythm. No VT, SVT, atrial fibrillation, high degree block, or pauses noted. Isolated atrial and ventricular ectopy was rare (<1%). There were 46 triggered events. These were all sinus rhythm, several with single ectopic beat. No significant arrhythmias detected.  Echo 08/10/2020: Sonographer Comments: 2ch apical excluded from [place] due to poor tracking   IMPRESSIONS   1. Left ventricular ejection fraction, by estimation, is 70 to 75%. The  left ventricle has hyperdynamic function. The left ventricle has no  regional wall motion abnormalities. Left ventricular diastolic parameters  are consistent with Grade I diastolic  dysfunction (impaired relaxation).   2. Right ventricular systolic function is normal. The right ventricular  size is normal.   3. The mitral valve is normal in structure. No evidence of mitral valve  regurgitation. No evidence of mitral stenosis.   4. The aortic valve is normal in structure. Aortic valve regurgitation is  not visualized. No aortic stenosis is present.   5. The inferior vena cava is normal in size with greater than 50%  respiratory variability, suggesting right atrial pressure of 3 mmHg.   CT-A Chest  07/05/2020: COMPARISON:  Chest CT dated 11/14/2007.   FINDINGS: Cardiovascular: There is no cardiomegaly or pericardial effusion. The thoracic aorta is unremarkable. The origins of the great vessels of the aortic arch appear patent as visualized. Evaluation of the pulmonary arteries is limited due to respiratory motion artifact. No pulmonary artery embolus identified.   Mediastinum/Nodes: There is no hilar or mediastinal adenopathy. The esophagus and the thyroid gland are  grossly unremarkable. No mediastinal fluid collection.   Lungs/Pleura: The lungs are clear. There is no pleural effusion pneumothorax. The central airways are patent.   Upper Abdomen: No acute abnormality.   Musculoskeletal: No acute osseous pathology. Small pocket of air in the superficial soft tissues of the right breast likely related to recent surgery.   Review of the MIP images confirms the above findings.   IMPRESSION: 1. No acute intrathoracic pathology. No CT evidence of pulmonary embolism. 2. Small pocket of air in the superficial soft tissues of the right breast likely related to recent surgery.  EKG:  EKG is personally reviewed.   08/11/2021: NSR at 79 bpm, PRWP 07/27/2020: NSR at 93 bpm  Recent Labs: 08/07/2021: ALT 10; BUN 13; Creatinine, Ser 0.72; Potassium 3.9; Sodium 135; TSH 1.735   Recent Lipid Panel    Component Value Date/Time   CHOL 154 08/07/2021 1212   TRIG 187 (H) 08/07/2021 1212   HDL 58 08/07/2021 1212   CHOLHDL 2.7 08/07/2021 1212   VLDL 37 08/07/2021 1212   LDLCALC 59 08/07/2021 1212   LDLDIRECT 121.5 (H) 12/23/2019 0832    Physical Exam:    VS:  BP 110/72   Pulse 79   Ht '5\' 4"'  (1.626 m)   Wt 236 lb (107 kg)   SpO2 99%   BMI 40.51 kg/m     Wt Readings from Last 3 Encounters:  08/11/21 236 lb (107 kg)  07/13/21 235 lb (106.6 kg)  07/06/21 237 lb (107.5 kg)    GEN: Well nourished, well developed in no acute distress HEENT: Normal, moist mucous  membranes NECK: No JVD CARDIAC: regular rhythm, normal S1 and S2, no rubs or gallops. No murmurs. VASCULAR: Radial and DP pulses 2+ bilaterally. No carotid bruits RESPIRATORY:  Clear to auscultation without rales, wheezing or rhonchi  ABDOMEN: Soft, non-tender, non-distended MUSCULOSKELETAL:  Ambulates independently SKIN: Warm and dry, no edema NEUROLOGIC:  Alert and oriented x 3. No focal neuro deficits noted. PSYCHIATRIC:  Normal affect    ASSESSMENT:    1. Palpitation   2. Precordial pain   3. Paroxysmal tachycardia ([place])   4. Shortness of breath   5. Cardiac risk counseling   6. Counseling on health promotion and disease prevention     PLAN:    Chest pain Tachycardia Palpitations Shortness of breath -prior Echo without significant abnormalities, only grade 1 diastolic dysfunction.  -Monitor without significant arrhythmias, symptoms were sinus with rare ectopy. -given chest pain and risk factors, we discussed options for further evaluation today --discussed treadmill stress, nuclear stress/lexiscan, and CT coronary angiography. Discussed pros and cons of each, including but not limited to false positive/false negative risk, radiation risk, and risk of IV contrast dye. Based on shared decision making, decision was made to pursue CT coronary angiography. -will give one time dose of metoprolol 2 hours prior to scheduled test -counseled on need to get BMET prior to test -counseled on use of sublingual nitroglycerin and its importance to a good test   Cardiac risk counseling and prevention recommendations: -recommend heart healthy/Mediterranean diet, with whole grains, fruits, vegetable, fish, lean meats, nuts, and olive oil. Limit salt. -recommend moderate walking, 3-5 times/week for 30-50 minutes each session. Aim for at least 150 minutes.week. Goal should be pace of 3 miles/hours, or walking 1.5 miles in 30 minutes -recommend avoidance of tobacco products. Avoid excess alcohol.   -ASCVD risk score: The 10-year ASCVD risk score (Arnett DK, et al., 2019) is: 2.6%   Values used  to calculate the score:     Age: 62 years     Sex: Female     Is Non-Hispanic African American: Yes     Diabetic: Yes     Tobacco smoker: No     Systolic Blood Pressure: 790 mmHg     Is BP treated: Yes     HDL Cholesterol: 58 mg/dL     Total Cholesterol: 154 mg/dL    Plan for follow up:  1 year or sooner based on Coronary CT results  Buford Dresser, MD, PhD [place]  [facility]    Medication Adjustments/Labs and Tests Ordered: Current medicines are reviewed at length with the patient today.  Concerns regarding medicines are outlined above.   Orders Placed This Encounter  Procedures   CT CORONARY MORPH W/CTA COR W/SCORE W/CA W/CM &/OR WO/CM   EKG 12-Lead    Meds ordered this encounter  Medications   metoprolol tartrate (LOPRESSOR) 50 MG tablet    Sig: TAKE 1 TABLET 2 HOURS PRIOR TO CARDIAC PROCEDURE    Dispense:  1 tablet    Refill:  0    Patient Instructions  Medication Instructions:  Your Physician recommend you continue on your current medication as directed.    *If you need a refill on your cardiac medications before your next appointment, please call your pharmacy*   Lab Work: None ordered today   Testing/Procedures: Cardiac CT Angiography (CTA), is a special type of CT scan that uses a computer to produce multi-dimensional views of major blood vessels throughout the body. In CT angiography, a contrast material is injected through an IV to help visualize the blood vessels  [facility]  Follow-Up: At [facility], you and your health needs are our priority.  As part of our continuing mission to provide you with exceptional heart care, we have created designated Provider Care Teams.  These Care Teams include your primary Cardiologist (physician) and Advanced Practice Providers (APPs -  Physician Assistants and Nurse Practitioners) who all  work together to provide you with the care you need, when you need it.  We recommend signing up for the patient portal called "MyChart".  Sign up information is provided on this After Visit Summary.  MyChart is used to connect with patients for Virtual Visits (Telemedicine).  Patients are able to view lab/test results, encounter notes, upcoming appointments, etc.  Non-urgent messages can be sent to your provider as well.   To learn more about what you can do with MyChart, go to [email or web address].    Your next appointment:   1 year(s)  The format for your next appointment:   In Person  Provider:   Buford Dresser, MD      Your cardiac CT will be scheduled at one of the below locations:   [facility] [address] [place], [place] [address] [phone]  If scheduled at [facility], please arrive at the [facility] main entrance (entrance A) of [facility] 30 minutes prior to test start time. You can use the FREE valet parking offered at the main entrance (encouraged to control the heart rate for the test) Proceed to the [facility] Radiology Department (first floor) to check-in and test prep.  If scheduled at [facility], please arrive 15 mins early for check-in and test prep.  Please follow these instructions carefully (unless otherwise directed):   On the Night Before the Test: Be sure to Drink plenty of water. Do not consume  any caffeinated/decaffeinated beverages or chocolate 12 hours prior to your test. Do not take any antihistamines 12 hours prior to your test.  On the Day of the Test: Drink plenty of water until 1 hour prior to the test. Do not eat any food 4 hours prior to the test. You may take your regular medications prior to the test.  Take metoprolol (Lopressor) 50 mg two hours prior to test. FEMALES- please wear underwire-free bra if available, avoid dresses & tight clothing        After  the Test: Drink plenty of water. After receiving IV contrast, you may experience a mild flushed feeling. This is normal. On occasion, you may experience a mild rash up to 24 hours after the test. This is not dangerous. If this occurs, you can take Benadryl 25 mg and increase your fluid intake. If you experience trouble breathing, this can be serious. If it is severe call 911 IMMEDIATELY. If it is mild, please call our office. If you take any of these medications: Glipizide/Metformin, Avandament, Glucavance, please do not take 48 hours after completing test unless otherwise instructed.  Please allow 2-4 weeks for scheduling of routine cardiac CTs. Some insurance companies require a pre-authorization which may delay scheduling of this test.   For non-scheduling related questions, please contact the cardiac imaging nurse navigator should you have any questions/concerns: Marchia Bond, Cardiac Imaging Nurse Navigator Gordy Clement, Cardiac Imaging Nurse Navigator [facility] Direct Office Dial: [phone]   For scheduling needs, including cancellations and rescheduling, please call [place], [phone].    I,Mathew Stumpf,acting as a Education administrator for [employer], MD.,have documented all relevant documentation on the behalf of Buford Dresser, MD,as directed by  Buford Dresser, MD while in the presence of Buford Dresser, MD.  I, Buford Dresser, MD, have reviewed all documentation for this visit. The documentation on 09/28/21 for the exam, diagnosis, procedures, and orders are all accurate and complete.   Signed, Buford Dresser, MD PhD 08/11/2021     [place]

## 2021-08-11 NOTE — Patient Instructions (Addendum)
Medication Instructions:  Your Physician recommend you continue on your current medication as directed.    *If you need a refill on your cardiac medications before your next appointment, please call your pharmacy*   Lab Work: None ordered today   Testing/Procedures: Cardiac CT Angiography (CTA), is a special type of CT scan that uses a computer to produce multi-dimensional views of major blood vessels throughout the body. In CT angiography, a contrast material is injected through an IV to help visualize the blood vessels  [facility]  Follow-Up: At [facility], you and your health needs are our priority.  As part of our continuing mission to provide you with exceptional heart care, we have created designated Provider Care Teams.  These Care Teams include your primary Cardiologist (physician) and Advanced Practice Providers (APPs -  Physician Assistants and Nurse Practitioners) who all work together to provide you with the care you need, when you need it.  We recommend signing up for the patient portal called "MyChart".  Sign up information is provided on this After Visit Summary.  MyChart is used to connect with patients for Virtual Visits (Telemedicine).  Patients are able to view lab/test results, encounter notes, upcoming appointments, etc.  Non-urgent messages can be sent to your provider as well.   To learn more about what you can do with MyChart, go to [email or web address].    Your next appointment:   1 year(s)  The format for your next appointment:   In Person  Provider:   Buford Dresser, MD      Your cardiac CT will be scheduled at one of the below locations:   [facility] [address] [place], [place] [address] [phone]  If scheduled at [facility], please arrive at the [facility] main entrance (entrance A) of [facility] 30 minutes prior to test start time. You can use the FREE valet parking offered at the  main entrance (encouraged to control the heart rate for the test) Proceed to the [facility] Radiology Department (first floor) to check-in and test prep.  If scheduled at [facility], please arrive 15 mins early for check-in and test prep.  Please follow these instructions carefully (unless otherwise directed):   On the Night Before the Test: Be sure to Drink plenty of water. Do not consume any caffeinated/decaffeinated beverages or chocolate 12 hours prior to your test. Do not take any antihistamines 12 hours prior to your test.  On the Day of the Test: Drink plenty of water until 1 hour prior to the test. Do not eat any food 4 hours prior to the test. You may take your regular medications prior to the test.  Take metoprolol (Lopressor) 50 mg two hours prior to test. FEMALES- please wear underwire-free bra if available, avoid dresses & tight clothing        After the Test: Drink plenty of water. After receiving IV contrast, you may experience a mild flushed feeling. This is normal. On occasion, you may experience a mild rash up to 24 hours after the test. This is not dangerous. If this occurs, you can take Benadryl 25 mg and increase your fluid intake. If you experience trouble breathing, this can be serious. If it is severe call 911 IMMEDIATELY. If it is mild, please call our office. If you take any of these medications: Glipizide/Metformin, Avandament, Glucavance, please do not take 48 hours after completing test unless otherwise instructed.  Please allow 2-4 weeks for  scheduling of routine cardiac CTs. Some insurance companies require a pre-authorization which may delay scheduling of this test.   For non-scheduling related questions, please contact the cardiac imaging nurse navigator should you have any questions/concerns: Marchia Bond, Cardiac Imaging Nurse Navigator Gordy Clement, Cardiac Imaging Nurse Navigator [place] [facility] Direct Office Dial: [phone]   For scheduling needs, including cancellations and rescheduling, please call [place], [phone].

## 2021-08-12 ENCOUNTER — Other Ambulatory Visit (HOSPITAL_COMMUNITY): Payer: Self-pay

## 2021-08-14 ENCOUNTER — Ambulatory Visit: Payer: 59 | Admitting: Nutrition

## 2021-08-14 ENCOUNTER — Other Ambulatory Visit: Payer: Self-pay

## 2021-08-14 DIAGNOSIS — R635 Abnormal weight gain: Secondary | ICD-10-CM | POA: Diagnosis not present

## 2021-08-14 DIAGNOSIS — N951 Menopausal and female climacteric states: Secondary | ICD-10-CM | POA: Diagnosis not present

## 2021-08-14 DIAGNOSIS — E7800 Pure hypercholesterolemia, unspecified: Secondary | ICD-10-CM | POA: Diagnosis not present

## 2021-08-14 DIAGNOSIS — E611 Iron deficiency: Secondary | ICD-10-CM | POA: Diagnosis not present

## 2021-08-14 DIAGNOSIS — E119 Type 2 diabetes mellitus without complications: Secondary | ICD-10-CM | POA: Diagnosis not present

## 2021-08-14 DIAGNOSIS — E559 Vitamin D deficiency, unspecified: Secondary | ICD-10-CM | POA: Diagnosis not present

## 2021-08-14 MED ORDER — ACCU-CHEK FASTCLIX LANCETS MISC
4 refills | Status: DC
  Filled 2021-08-15: qty 306, 77d supply, fill #0

## 2021-08-14 MED ORDER — FREESTYLE LIBRE 2 SENSOR MISC
4 refills | Status: DC
  Filled 2021-08-14 – 2021-08-15 (×2): qty 6, 84d supply, fill #0

## 2021-08-15 ENCOUNTER — Other Ambulatory Visit (HOSPITAL_COMMUNITY): Payer: Self-pay

## 2021-08-16 ENCOUNTER — Other Ambulatory Visit (HOSPITAL_COMMUNITY): Payer: Self-pay

## 2021-08-16 DIAGNOSIS — E119 Type 2 diabetes mellitus without complications: Secondary | ICD-10-CM | POA: Diagnosis not present

## 2021-08-16 DIAGNOSIS — N951 Menopausal and female climacteric states: Secondary | ICD-10-CM | POA: Diagnosis not present

## 2021-08-16 DIAGNOSIS — G4733 Obstructive sleep apnea (adult) (pediatric): Secondary | ICD-10-CM | POA: Diagnosis not present

## 2021-08-16 DIAGNOSIS — E559 Vitamin D deficiency, unspecified: Secondary | ICD-10-CM | POA: Diagnosis not present

## 2021-08-16 DIAGNOSIS — E611 Iron deficiency: Secondary | ICD-10-CM | POA: Diagnosis not present

## 2021-08-16 DIAGNOSIS — E7800 Pure hypercholesterolemia, unspecified: Secondary | ICD-10-CM | POA: Diagnosis not present

## 2021-08-16 DIAGNOSIS — Z1339 Encounter for screening examination for other mental health and behavioral disorders: Secondary | ICD-10-CM | POA: Diagnosis not present

## 2021-08-16 DIAGNOSIS — Z6841 Body Mass Index (BMI) 40.0 and over, adult: Secondary | ICD-10-CM | POA: Diagnosis not present

## 2021-08-16 DIAGNOSIS — Z1331 Encounter for screening for depression: Secondary | ICD-10-CM | POA: Diagnosis not present

## 2021-08-16 MED FILL — Insulin Lispro Soln Pen-injector 100 Unit/ML (1 Unit Dial): SUBCUTANEOUS | 20 days supply | Qty: 15 | Fill #4 | Status: AC

## 2021-08-18 ENCOUNTER — Other Ambulatory Visit (HOSPITAL_COMMUNITY): Payer: Self-pay

## 2021-08-26 ENCOUNTER — Other Ambulatory Visit (HOSPITAL_COMMUNITY): Payer: Self-pay

## 2021-08-30 ENCOUNTER — Encounter (HOSPITAL_COMMUNITY): Payer: Self-pay

## 2021-08-30 ENCOUNTER — Telehealth (HOSPITAL_COMMUNITY): Payer: Self-pay | Admitting: Emergency Medicine

## 2021-08-30 NOTE — Telephone Encounter (Signed)
Attempted to call patient regarding upcoming cardiac CT appointment. °Left message on voicemail with name and callback number °Allure Greaser RN Navigator Cardiac Imaging °[facility] °[phone] Office °[phone] Cell ° °

## 2021-08-31 ENCOUNTER — Other Ambulatory Visit: Payer: Self-pay

## 2021-08-31 ENCOUNTER — Encounter (HOSPITAL_COMMUNITY): Payer: Self-pay

## 2021-08-31 ENCOUNTER — Ambulatory Visit (HOSPITAL_COMMUNITY)
Admission: RE | Admit: 2021-08-31 | Discharge: 2021-08-31 | Disposition: A | Discharge status: Home / Self Care | Payer: 59 | Source: Ambulatory Visit | Attending: Cardiology | Admitting: Cardiology

## 2021-08-31 ENCOUNTER — Other Ambulatory Visit (HOSPITAL_COMMUNITY): Payer: Self-pay

## 2021-08-31 DIAGNOSIS — R072 Precordial pain: Secondary | ICD-10-CM | POA: Diagnosis not present

## 2021-08-31 IMAGING — CT CT HEART MORP W/ CTA COR W/ SCORE W/ CA W/CM &/OR W/O CM
4 of 7 series · 8 of 20 positions shown, 9 images · IV contrast (omnipaque)
Comparison: None.

Addendum:
HISTORY: Chest pain, nonspecific

EXAM:
Cardiac/Coronary CT
TECHNIQUE: The patient was scanned on a Siemens Force scanner.
PROTOCOL: A 130 kV prospective scan was triggered in the descending thoracic
aorta at 111 HU's. Axial non-contrast 3 mm slices were carried out
through the heart. The data set was analyzed on a dedicated work
station and scored using the Agatson method. Gantry rotation speed
was 250 msecs and collimation was 0.6 mm. Heart rate optimized
medically, and 0.8 mg of sublingual nitroglycerin was given. The 3D
data set was reconstructed in 5% intervals of 35-75% of the R-R
cycle. Diastolic phases were analyzed on a dedicated work station
using MPR, MIP and VRT modes. The patient received 95mL OMNIPAQUE
IOHEXOL 350 MG/ML SOLN of contrast.

[Series 6: best diast · axial · 0.39mm/px · z∈[-276,-235]mm · 2 of 308 slices shown, 3 images]
[im 103/308  vessel]
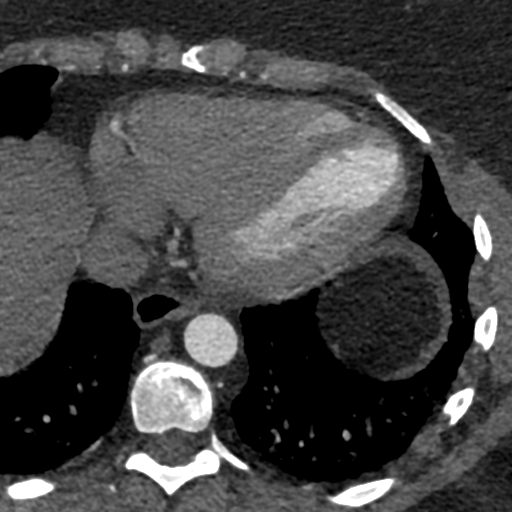
[im 103/308  lung]
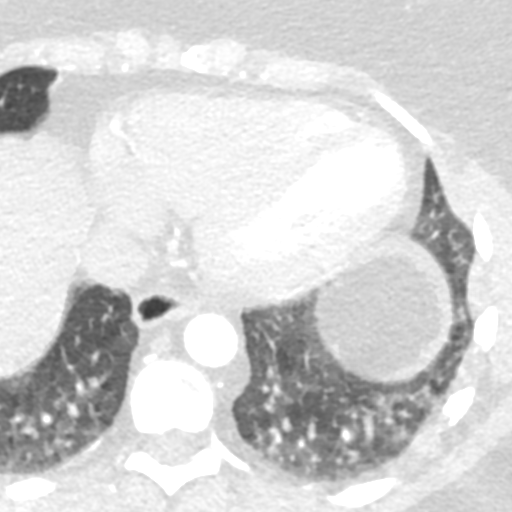
[im 205/308  vessel]
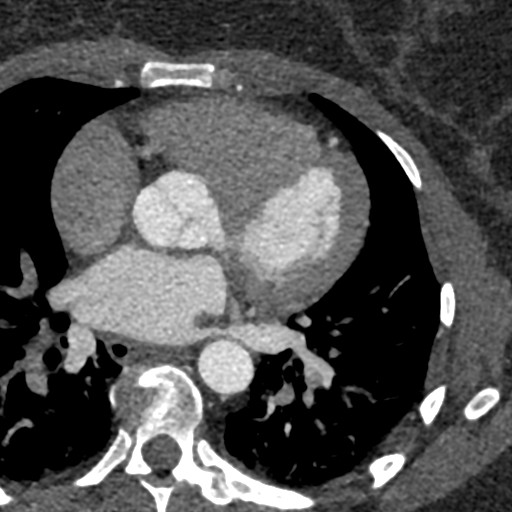

[Series 7: best syst · axial · 0.39mm/px · z∈[-276,-235]mm · 2 of 308 slices shown]
[im 103/308  vessel]
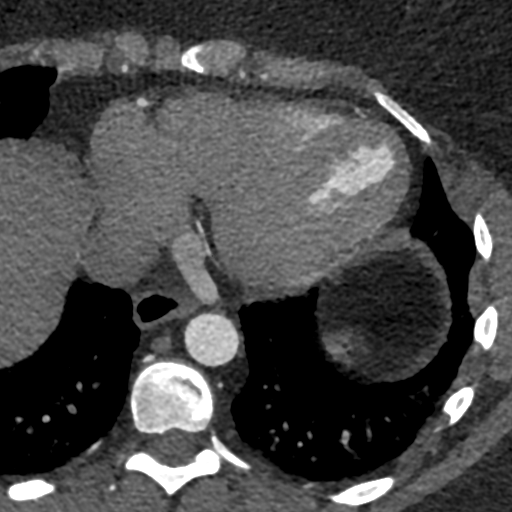
[im 205/308  vessel]
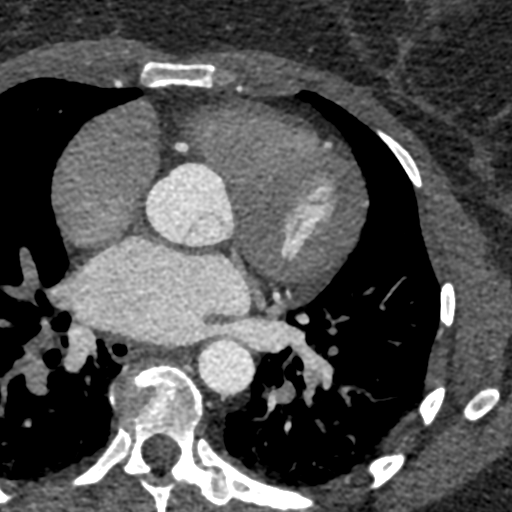

[Series 8: ts syst sharp · axial · 0.39mm/px · z∈[-276,-235]mm · 2 of 308 slices shown]
[im 103/308  lung]
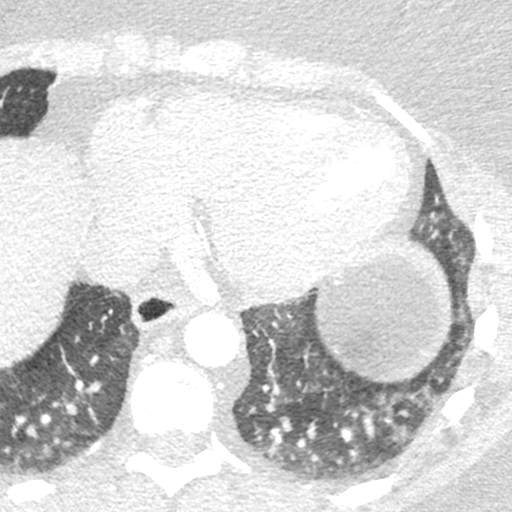
[im 205/308  lung]
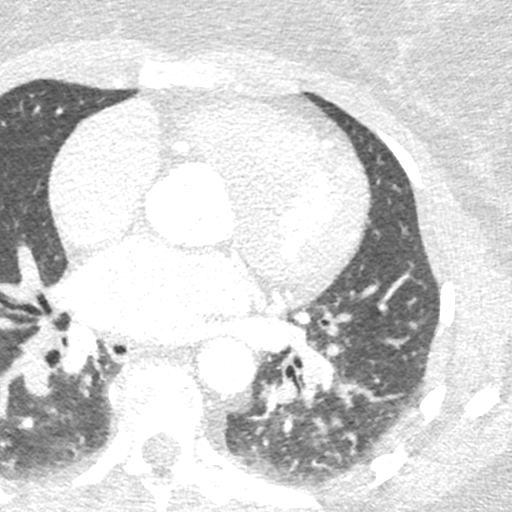

[Series 9: ts diast sharp · axial · 0.39mm/px · z∈[-276,-235]mm · 2 of 308 slices shown]
[im 103/308  lung]
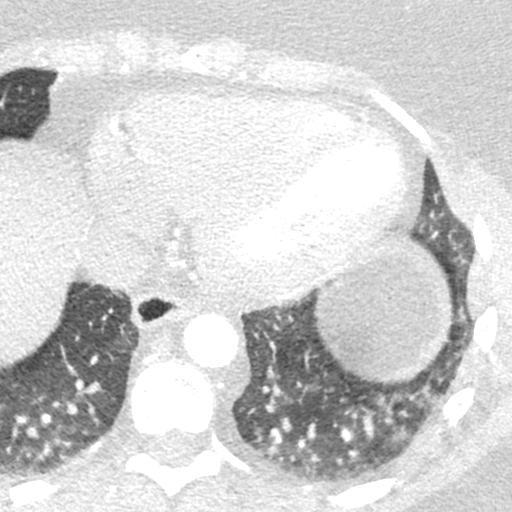
[im 205/308  lung]
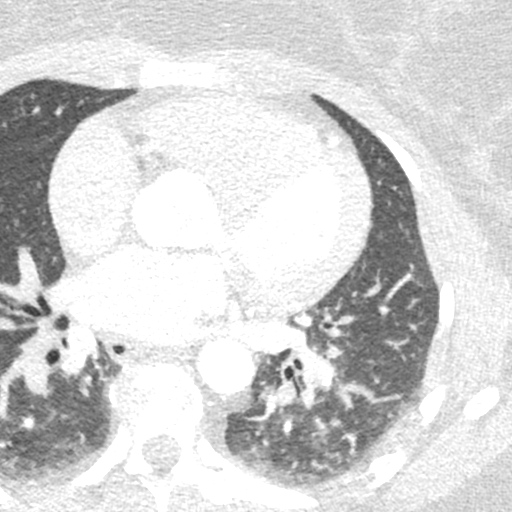

[8 of 20 positions shown; findings below may reference images not displayed]

FINDINGS: Coronary calcium score: The patient's coronary artery calcium score
is 0, which places the patient in the 0 percentile.

Coronary arteries: Normal coronary origins.  Right dominance.

Right Coronary Artery: Normal caliber vessel, gives rise to PDA. No
significant plaque or stenosis.

Left Main Coronary Artery: Normal caliber vessel. No significant
plaque or stenosis.

Left Anterior Descending Coronary Artery: Normal caliber vessel. No
significant plaque or stenosis. Gives rise to 1 diagonal branches.

Left Circumflex Artery: Normal caliber vessel. No significant plaque
or stenosis. Gives rise to 1 OM branches.

Aorta: Normal size, 32 mm at the mid ascending aorta (level of the
PA bifurcation) measured double oblique. No calcifications. No
dissection seen in visualized portions of the aorta.

Aortic Valve: No calcifications. Trileaflet.

Other findings:

Normal pulmonary vein drainage into the left atrium.

Normal left atrial appendage without a thrombus.

Normal size of the pulmonary artery.

Normal appearance of the pericardium.
IMPRESSION: 1. No evidence of CAD, CADRADS = 0.

2. Coronary calcium score of 0. This was 0 percentile for age and
sex matched control.

3. Normal coronary origin with right dominance.

INTERPRETATION:

1. CAD-RADS 0: No evidence of CAD (0%). Consider non-atherosclerotic
causes of chest pain.

2. CAD-RADS 1: Minimal non-obstructive CAD (0-24%). Consider
non-atherosclerotic causes of chest pain. Consider preventive
therapy and risk factor modification.

3. CAD-RADS 2: Mild non-obstructive CAD (25-49%). Consider
non-atherosclerotic causes of chest pain. Consider preventive
therapy and risk factor modification.

4. CAD-RADS 3: Moderate stenosis (50-69%). Consider symptom-guided
anti-ischemic pharmacotherapy as well as risk factor modification
per guideline directed care. Additional analysis with CT FFR will be
submitted.

5. CAD-RADS 4: Severe stenosis. (70-99% or > 50% left main). Cardiac
catheterization or CT FFR is recommended. Consider symptom-guided
anti-ischemic pharmacotherapy as well as risk factor modification
per guideline directed care. Invasive coronary angiography
recommended with revascularization per published guideline
statements.

6. CAD-RADS 5: Total coronary occlusion (100%). Consider cardiac
catheterization or viability assessment. Consider symptom-guided
anti-ischemic pharmacotherapy as well as risk factor modification
per guideline directed care.

7. CAD-RADS N: Non-diagnostic study. Obstructive CAD can't be
excluded. Alternative evaluation is recommended.

EXAM:
OVER-READ INTERPRETATION  CT CHEST

The following report is an over-read performed by radiologist Dr.
does not include interpretation of cardiac or coronary anatomy or
pathology. The coronary calcium score/coronary CTA interpretation by
the cardiologist is attached.
FINDINGS: No suspicious nodules, masses, or infiltrates are identified in the
visualized portion of the lungs. No pleural fluid seen.

The visualized portions of the mediastinum and chest wall are
unremarkable.
IMPRESSION: No significant non-cardiac abnormality in visualized portion of the
thorax.

*** End of Addendum ***
FINDINGS: Coronary calcium score: The patient's coronary artery calcium score
is 0, which places the patient in the 0 percentile.

Coronary arteries: Normal coronary origins.  Right dominance.

Right Coronary Artery: Normal caliber vessel, gives rise to PDA. No
significant plaque or stenosis.

Left Main Coronary Artery: Normal caliber vessel. No significant
plaque or stenosis.

Left Anterior Descending Coronary Artery: Normal caliber vessel. No
significant plaque or stenosis. Gives rise to 1 diagonal branches.

Left Circumflex Artery: Normal caliber vessel. No significant plaque
or stenosis. Gives rise to 1 OM branches.

Aorta: Normal size, 32 mm at the mid ascending aorta (level of the
PA bifurcation) measured double oblique. No calcifications. No
dissection seen in visualized portions of the aorta.

Aortic Valve: No calcifications. Trileaflet.

Other findings:

Normal pulmonary vein drainage into the left atrium.

Normal left atrial appendage without a thrombus.

Normal size of the pulmonary artery.

Normal appearance of the pericardium.
IMPRESSION: 1. No evidence of CAD, CADRADS = 0.

2. Coronary calcium score of 0. This was 0 percentile for age and
sex matched control.

3. Normal coronary origin with right dominance.

INTERPRETATION:

1. CAD-RADS 0: No evidence of CAD (0%). Consider non-atherosclerotic
causes of chest pain.

2. CAD-RADS 1: Minimal non-obstructive CAD (0-24%). Consider
non-atherosclerotic causes of chest pain. Consider preventive
therapy and risk factor modification.

3. CAD-RADS 2: Mild non-obstructive CAD (25-49%). Consider
non-atherosclerotic causes of chest pain. Consider preventive
therapy and risk factor modification.

4. CAD-RADS 3: Moderate stenosis (50-69%). Consider symptom-guided
anti-ischemic pharmacotherapy as well as risk factor modification
per guideline directed care. Additional analysis with CT FFR will be
submitted.

5. CAD-RADS 4: Severe stenosis. (70-99% or > 50% left main). Cardiac
catheterization or CT FFR is recommended. Consider symptom-guided
anti-ischemic pharmacotherapy as well as risk factor modification
per guideline directed care. Invasive coronary angiography
recommended with revascularization per published guideline
statements.

6. CAD-RADS 5: Total coronary occlusion (100%). Consider cardiac
catheterization or viability assessment. Consider symptom-guided
anti-ischemic pharmacotherapy as well as risk factor modification
per guideline directed care.

7. CAD-RADS N: Non-diagnostic study. Obstructive CAD can't be
excluded. Alternative evaluation is recommended.

## 2021-08-31 MED ORDER — NITROGLYCERIN 0.4 MG SL SUBL
0.8000 mg | SUBLINGUAL_TABLET | Freq: Once | SUBLINGUAL | Status: AC
  Administered 2021-08-31: 0.8 mg via SUBLINGUAL

## 2021-08-31 MED ORDER — IOHEXOL 350 MG/ML SOLN
95.0000 mL | Freq: Once | INTRAVENOUS | Status: AC | PRN
  Administered 2021-08-31: 95 mL via INTRAVENOUS

## 2021-08-31 MED ORDER — LISINOPRIL 5 MG PO TABS
ORAL_TABLET | ORAL | 5 refills | Status: DC
  Filled 2021-08-31: qty 90, 90d supply, fill #0
  Filled 2021-11-16: qty 90, 90d supply, fill #1

## 2021-08-31 MED ORDER — METOPROLOL TARTRATE 5 MG/5ML IV SOLN
INTRAVENOUS | Status: AC
  Administered 2021-08-31: 5 mg
  Filled 2021-08-31: qty 5

## 2021-08-31 MED ORDER — METOPROLOL TARTRATE 5 MG/5ML IV SOLN: 5.0000 mg | INTRAVENOUS | Status: DC | PRN

## 2021-08-31 MED ORDER — NITROGLYCERIN 0.4 MG SL SUBL
SUBLINGUAL_TABLET | SUBLINGUAL | Status: AC
  Filled 2021-08-31: qty 2

## 2021-08-31 MED FILL — Baclofen Tab 10 MG: ORAL | 13 days supply | Qty: 40 | Fill #4 | Status: AC

## 2021-09-01 ENCOUNTER — Other Ambulatory Visit (HOSPITAL_COMMUNITY): Payer: Self-pay

## 2021-09-04 ENCOUNTER — Other Ambulatory Visit (HOSPITAL_COMMUNITY): Payer: Self-pay

## 2021-09-04 DIAGNOSIS — E1165 Type 2 diabetes mellitus with hyperglycemia: Secondary | ICD-10-CM | POA: Diagnosis not present

## 2021-09-04 DIAGNOSIS — G43909 Migraine, unspecified, not intractable, without status migrainosus: Secondary | ICD-10-CM | POA: Diagnosis not present

## 2021-09-04 DIAGNOSIS — I10 Essential (primary) hypertension: Secondary | ICD-10-CM | POA: Diagnosis not present

## 2021-09-04 DIAGNOSIS — G4733 Obstructive sleep apnea (adult) (pediatric): Secondary | ICD-10-CM | POA: Diagnosis not present

## 2021-09-04 DIAGNOSIS — E7800 Pure hypercholesterolemia, unspecified: Secondary | ICD-10-CM | POA: Diagnosis not present

## 2021-09-05 DIAGNOSIS — F3181 Bipolar II disorder: Secondary | ICD-10-CM | POA: Diagnosis not present

## 2021-09-05 DIAGNOSIS — F411 Generalized anxiety disorder: Secondary | ICD-10-CM | POA: Diagnosis not present

## 2021-09-06 ENCOUNTER — Other Ambulatory Visit (HOSPITAL_COMMUNITY): Payer: Self-pay

## 2021-09-06 MED ORDER — ZOLPIDEM TARTRATE ER 12.5 MG PO TBCR
12.5000 mg | EXTENDED_RELEASE_TABLET | Freq: Every evening | ORAL | 0 refills | Status: DC
  Filled 2021-09-29 – 2021-10-04 (×2): qty 90, 90d supply, fill #0

## 2021-09-08 ENCOUNTER — Telehealth: Payer: Self-pay | Admitting: Orthopaedic Surgery

## 2021-09-08 ENCOUNTER — Encounter: Payer: Self-pay | Admitting: Physician Assistant

## 2021-09-08 ENCOUNTER — Other Ambulatory Visit (HOSPITAL_COMMUNITY): Payer: Self-pay

## 2021-09-08 ENCOUNTER — Ambulatory Visit: Payer: 59 | Admitting: Physician Assistant

## 2021-09-08 ENCOUNTER — Other Ambulatory Visit: Payer: Self-pay

## 2021-09-08 DIAGNOSIS — G43009 Migraine without aura, not intractable, without status migrainosus: Secondary | ICD-10-CM

## 2021-09-08 DIAGNOSIS — N951 Menopausal and female climacteric states: Secondary | ICD-10-CM | POA: Diagnosis not present

## 2021-09-08 DIAGNOSIS — E119 Type 2 diabetes mellitus without complications: Secondary | ICD-10-CM | POA: Diagnosis not present

## 2021-09-08 MED ORDER — CHLORPROMAZINE HCL 25 MG PO TABS
ORAL_TABLET | ORAL | 2 refills | Status: DC
  Filled 2021-09-08: qty 30, 30d supply, fill #0
  Filled 2021-11-16: qty 30, 30d supply, fill #1

## 2021-09-08 MED ORDER — REYVOW 100 MG PO TABS
200.0000 mg | ORAL_TABLET | ORAL | 5 refills | Status: DC | PRN
  Filled 2021-09-08: qty 16, 60d supply, fill #0
  Filled 2021-11-16: qty 16, 60d supply, fill #1
  Filled 2021-12-20: qty 16, 60d supply, fill #2
  Filled 2022-03-06: qty 16, 60d supply, fill #3

## 2021-09-08 MED ORDER — PROMETHAZINE HCL 25 MG PO TABS
ORAL_TABLET | Freq: Four times a day (QID) | ORAL | 1 refills | Status: DC | PRN
  Filled 2021-09-08: qty 30, 8d supply, fill #0
  Filled 2021-12-20: qty 30, 8d supply, fill #1

## 2021-09-08 MED ORDER — BACLOFEN 10 MG PO TABS
ORAL_TABLET | ORAL | 5 refills | Status: DC
  Filled 2021-09-08: qty 40, 13d supply, fill #0
  Filled 2021-11-27: qty 40, 13d supply, fill #1
  Filled 2021-11-30: qty 40, 13d supply, fill #2
  Filled 2022-03-21: qty 40, 13d supply, fill #3
  Filled 2022-04-25: qty 40, 13d supply, fill #4

## 2021-09-08 MED ORDER — GABAPENTIN 400 MG PO CAPS
400.0000 mg | ORAL_CAPSULE | Freq: Three times a day (TID) | ORAL | 1 refills | Status: DC
  Filled 2021-09-08: qty 270, 90d supply, fill #0

## 2021-09-08 NOTE — Patient Instructions (Signed)
Migraine Headache A migraine headache is an intense, throbbing pain on one side or both sides of the head. Migraine headaches may also cause other symptoms, such as nausea, vomiting, and sensitivity to light and noise. A migraine headache can last from 4 hours to 3 days. Talk with your doctor about what things may bring on (trigger) your migraine headaches. What are the causes? The exact cause of this condition is not known. However, a migraine may be caused when nerves in the brain become irritated and release chemicals that cause inflammation of blood vessels. This inflammation causes pain. This condition may be triggered or caused by: Drinking alcohol. Smoking. Taking medicines, such as: Medicine used to treat chest pain (nitroglycerin). Birth control pills. Estrogen. Certain blood pressure medicines. Eating or drinking products that contain nitrates, glutamate, aspartame, or tyramine. Aged cheeses, chocolate, or caffeine may also be triggers. Doing physical activity. Other things that may trigger a migraine headache include: Menstruation. Pregnancy. Hunger. Stress. Lack of sleep or too much sleep. Weather changes. Fatigue. What increases the risk? The following factors may make you more likely to experience migraine headaches: Being a certain age. This condition is more common in people who are 25-55 years old. Being female. Having a family history of migraine headaches. Being Caucasian. Having a mental health condition, such as depression or anxiety. Being obese. What are the signs or symptoms? The main symptom of this condition is pulsating or throbbing pain. This pain may: Happen in any area of the head, such as on one side or both sides. Interfere with daily activities. Get worse with physical activity. Get worse with exposure to bright lights or loud noises. Other symptoms may include: Nausea. Vomiting. Dizziness. General sensitivity to bright lights, loud noises, or  smells. Before you get a migraine headache, you may get warning signs (an aura). An aura may include: Seeing flashing lights or having blind spots. Seeing bright spots, halos, or zigzag lines. Having tunnel vision or blurred vision. Having numbness or a tingling feeling. Having trouble talking. Having muscle weakness. Some people have symptoms after a migraine headache (postdromal phase), such as: Feeling tired. Difficulty concentrating. How is this diagnosed? A migraine headache can be diagnosed based on: Your symptoms. A physical exam. Tests, such as: CT scan or an MRI of the head. These imaging tests can help rule out other causes of headaches. Taking fluid from the spine (lumbar puncture) and analyzing it (cerebrospinal fluid analysis, or CSF analysis). How is this treated? This condition may be treated with medicines that: Relieve pain. Relieve nausea. Prevent migraine headaches. Treatment for this condition may also include: Acupuncture. Lifestyle changes like avoiding foods that trigger migraine headaches. Biofeedback. Cognitive behavioral therapy. Follow these instructions at home: Medicines Take over-the-counter and prescription medicines only as told by your health care provider. Ask your health care provider if the medicine prescribed to you: Requires you to avoid driving or using heavy machinery. Can cause constipation. You may need to take these actions to prevent or treat constipation: Drink enough fluid to keep your urine pale yellow. Take over-the-counter or prescription medicines. Eat foods that are high in fiber, such as beans, whole grains, and fresh fruits and vegetables. Limit foods that are high in fat and processed sugars, such as fried or sweet foods. Lifestyle Do not drink alcohol. Do not use any products that contain nicotine or tobacco, such as cigarettes, e-cigarettes, and chewing tobacco. If you need help quitting, ask your health care  provider. Get at least 8   hours of sleep every night. Find ways to manage stress, such as meditation, deep breathing, or yoga. General instructions   Keep a journal to find out what may trigger your migraine headaches. For example, write down: What you eat and drink. How much sleep you get. Any change to your diet or medicines. If you have a migraine headache: Avoid things that make your symptoms worse, such as bright lights. It may help to lie down in a dark, quiet room. Do not drive or use heavy machinery. Ask your health care provider what activities are safe for you while you are experiencing symptoms. Keep all follow-up visits as told by your health care provider. This is important. Contact a health care provider if: You develop symptoms that are different or more severe than your usual migraine headache symptoms. You have more than 15 headache days in one month. Get help right away if: Your migraine headache becomes severe. Your migraine headache lasts longer than 72 hours. You have a fever. You have a stiff neck. You have vision loss. Your muscles feel weak or like you cannot control them. You start to lose your balance often. You have trouble walking. You faint. You have a seizure. Summary A migraine headache is an intense, throbbing pain on one side or both sides of the head. Migraines may also cause other symptoms, such as nausea, vomiting, and sensitivity to light and noise. This condition may be treated with medicines and lifestyle changes. You may also need to avoid certain things that trigger a migraine headache. Keep a journal to find out what may trigger your migraine headaches. Contact your health care provider if you have more than 15 headache days in a month or you develop symptoms that are different or more severe than your usual migraine headache symptoms. This information is not intended to replace advice given to you by your health care provider. Make sure you  discuss any questions you have with your health care provider. Document Revised: 01/02/2019 Document Reviewed: 10/23/2018 Elsevier Patient Education  2022 Elsevier Inc.  

## 2021-09-08 NOTE — Telephone Encounter (Signed)
[employer] forms received. To [employer]. 

## 2021-09-08 NOTE — Telephone Encounter (Signed)
Received medical records release form/$25.00 cash /forwarding to [employer] today

## 2021-09-08 NOTE — Progress Notes (Signed)
Patient here to F/U on Headache. States when time for cycle migraines are 7/10x.  History:  Julie Hansen is a 50 y.o. [id number] who presents to clinic today for f/u headache.  She started with [facility] (weight loss).  Blood work there revealed many abnormalities which are being addressed.  She has a testosterone pellet.  She is getting vitamin D regularly and several other things.  She has noticed worsening of headaches related to her cycle.  Reyvow and phenergan have helped.  She is using gabapentin 3 times daily which has helped decreased frequency.  However when they do come on, they are just as severe.    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   Past Medical History:  Diagnosis Date   Acid reflux    Asthma    Diabetes mellitus without complication (HCC)    Hyperlipidemia    PCOS (polycystic ovarian syndrome)    PONV (postoperative nausea and vomiting)    Sleep apnea    uses cpap    Social History   Socioeconomic History   Marital status: Married    Spouse name: Not on file   Number of children: Not on file   Years of education: Not on file   Highest education level: Not on file  Occupational History   Not on file  Tobacco Use   Smoking status: Never   Smokeless tobacco: Never  Vaping Use   Vaping Use: Never used  Substance and Sexual Activity   Alcohol use: Yes    Comment: occas wine   Drug use: No   Sexual activity: Yes    Birth control/protection: None  Other Topics Concern   Not on file  Social History Narrative   Not on file   Social Determinants of Health   Financial Resource Strain: Not on file  Food Insecurity: Not on file  Transportation Needs: Not on file  Physical Activity: Not on file  Stress: Not on file  Social Connections: Not on file  Intimate Partner Violence: Not on file    Family History  Problem Relation Age of Onset   Cancer Mother    Hypertension Mother    Diabetes Mother    Breast cancer Mother 66   Cancer Maternal Grandmother    Breast cancer  Maternal Grandmother 5   Cancer Father    Diabetes Father    Hypertension Father    Breast cancer Paternal Aunt 46   Breast cancer Paternal Aunt 72   Breast cancer Paternal Aunt 35    Allergies  Allergen Reactions   Macrodantin [Nitrofurantoin Macrocrystal] Itching   Carbamazepine Other (See Comments)    Vaginal discharge    Current Outpatient Medications on File Prior to Visit  Medication Sig Dispense Refill   Accu-Chek FastClix Lancets MISC Use 4 times a day 400 each 4   Lasmiditan Succinate (REYVOW) 100 MG TABS Take 2 tablets (200 mg) by mouth as needed for headache 8 tablet 4   linaclotide (LINZESS) 290 MCG CAPS capsule Take 1 capsule by mouth 30 minutes before the first meal 90 capsule 4   lisinopril (ZESTRIL) 5 MG tablet Take one tablet (5 mg dose) by mouth daily. 30 tablet 5   tirzepatide (MOUNJARO) 5 MG/0.5ML Pen Inject 5 mg into the skin once a week 2 mL 2   traMADol (ULTRAM) 50 MG tablet take one tablet by mouth every 6 hours as needed for pain 30 tablet 1   Vitamin D, Ergocalciferol, (DRISDOL) 1.25 MG (50000 UNIT) CAPS capsule TAKE ONE CAPSULE BY MOUTH  TWO TIMES A WEEK 24 capsule 3   albuterol (PROVENTIL) (2.5 MG/3ML) 0.083% nebulizer solution Take 2.5 mg by nebulization every 6 (six) hours as needed.     ALPRAZolam (XANAX) 0.25 MG tablet Take 1 tablet by mouth daily as needed 30 tablet 2   ALPRAZolam (XANAX) 0.25 MG tablet Take 1 tablet by mouth daily as needed (Patient not taking: Reported on 08/11/2021) 90 tablet 0   ALPRAZolam (XANAX) 0.25 MG tablet Take 1 tablet by mouth daily as needed (Patient not taking: Reported on 08/11/2021) 30 tablet 1   atorvastatin (LIPITOR) 40 MG tablet Take one tablet (40 mg dose) by mouth daily. 90 tablet 3   baclofen (LIORESAL) 10 MG tablet TAKE 1 TABLET BY MOUTH DAILY AS NEEDED UP TO 3 TIMES DAILY 40 tablet 5   beclomethasone (QVAR REDIHALER) 40 MCG/ACT inhaler Inhale 2 puffs into the lungs 2 times daily 26.1 g 2   cetirizine (ZYRTEC  ALLERGY) 10 MG tablet Take 1 tablet (10 mg total) by mouth daily. 30 tablet 0   chlorproMAZINE (THORAZINE) 25 MG tablet TAKE 1 TABLET BY MOUTH AS NEEDED FOR HEADACHE 30 tablet 2   Continuous Blood Gluc Receiver (DEXCOM G6 RECEIVER) DEVI as directed 1 each 1   Continuous Blood Gluc Sensor (DEXCOM G6 SENSOR) MISC use as directed subcutaneous every 10 days (Patient not taking: Reported on 08/11/2021) 3 each 6   Continuous Blood Gluc Sensor (FREESTYLE LIBRE 2 SENSOR) MISC Use as directed every 14 days 6 each 4   Continuous Blood Gluc Sensor (FREESTYLE LIBRE SENSOR SYSTEM) MISC Use as directed every 14 days 6 each 4   Continuous Blood Gluc Transmit (DEXCOM G6 TRANSMITTER) MISC use every 90 days 1 each 4   dapagliflozin propanediol (FARXIGA) 10 MG TABS tablet Take 1 tablet by mouth daily 30 tablet 3   dapagliflozin propanediol (FARXIGA) 10 MG TABS tablet Take 1 tablet by mouth daily (Patient not taking: Reported on 08/11/2021) 90 tablet 4   dexlansoprazole (DEXILANT) 60 MG capsule Take 1 capsule (60 mg total) by mouth daily 20 minutes before breakfast. 90 capsule 4   fenofibrate (TRICOR) 48 MG tablet TAKE ONE TABLET BY MOUTH DAILY 30 tablet 5   fluticasone (FLONASE) 50 MCG/ACT nasal spray USE 1 SPRAY INTO BOTH NOSTRILS DAILY. 16 g 0   gabapentin (NEURONTIN) 300 MG capsule Take 1 capsule (300 mg total) by mouth 3 (three) times daily. 180 capsule 2   glucose blood test strip Use to check blood sugar 3 times a day 100 each 6   hydrOXYzine (ATARAX) 10 MG tablet Take 2 tablets by mouth at bedtime 180 tablet 1   ibuprofen (ADVIL,MOTRIN) 800 MG tablet Take 1 tablet (800 mg total) by mouth every 8 (eight) hours as needed. 60 tablet 3   insulin degludec (TRESIBA FLEXTOUCH) 200 UNIT/ML FlexTouch Pen Inject into the skin 32 units in the morning and 74 units in the evening 30 mL 6   insulin degludec (TRESIBA) 200 UNIT/ML FlexTouch Pen Inject 26 units into the skin in the morning and 60 units in the evening (Patient  not taking: Reported on 08/11/2021) 15 mL 3   insulin lispro (HUMALOG) 100 UNIT/ML KwikPen INJECT 20 - 25 UNITS SUBCUTANEOUS 3 TIMES A DAY 15 mL 6   Insulin Pen Needle 32G X 4 MM MISC USE AS DIRECTED 4 TIMES A DAY 400 each 4   linaclotide (LINZESS) 290 MCG CAPS capsule Take 1 capsule by mouth 30 minutes before the first meal 90 capsule 4  meloxicam (MOBIC) 15 MG tablet Take 1 tablet (15 mg total) by mouth daily with a meal 90 tablet 2   metoprolol tartrate (LOPRESSOR) 50 MG tablet TAKE 1 TABLET 2 HOURS PRIOR TO CARDIAC PROCEDURE 1 tablet 0   promethazine (PHENERGAN) 25 MG tablet TAKE 1 TABLET BY MOUTH EVERY 6 HOURS AS NEEDED. 30 tablet 1   QUEtiapine (SEROQUEL XR) 300 MG 24 hr tablet Take 2 every evening 180 tablet 1   QUEtiapine (SEROQUEL XR) 300 MG 24 hr tablet Take 2 tablets by mouth in the evening (Patient not taking: Reported on 08/11/2021) 180 tablet 1   Semaglutide (OZEMPIC, 1 MG/DOSE, [place]) Inject into the skin.     Semaglutide, 1 MG/DOSE, (OZEMPIC, 1 MG/DOSE,) 4 MG/3ML SOPN Inject 1mg  subcutaneously once a week (Patient not taking: Reported on 09/08/2021) 9 mL 4   Semaglutide, 1 MG/DOSE, (OZEMPIC, 1 MG/DOSE,) 4 MG/3ML SOPN Inject 1mg  subcutaneously once a week. (Patient not taking: Reported on 08/11/2021) 3 mL 4   zolpidem (AMBIEN CR) 12.5 MG CR tablet Take 1 by mouth at bedtime 90 tablet 0   zolpidem (AMBIEN CR) 12.5 MG CR tablet Take 1 tablet by mouth at bedtime. (Patient not taking: Reported on 08/11/2021) 90 tablet 0   [START ON 09/25/2021] zolpidem (AMBIEN CR) 12.5 MG CR tablet Take 1 tablet (12.5 mg total) by mouth at bedtime. 90 tablet 0   No current facility-administered medications on file prior to visit.     Review of Systems:  All pertinent positive/negative included in HPI, all other review of systems are negative   Objective:  Physical Exam BP 113/72    Pulse 91    Wt 232 lb (105.2 kg)    LMP 08/09/2021    BMI 39.82 kg/m  CONSTITUTIONAL: Well-developed, well-nourished  female in no acute distress.  EYES: EOM intact ENT: Normocephalic CARDIOVASCULAR: Regular rate  RESPIRATORY: Normal rate.  MUSCULOSKELETAL: Normal ROM SKIN: Warm, dry without erythema  NEUROLOGICAL: Alert, oriented, CN II-XII grossly intact, Appropriate balance PSYCH: Normal behavior, mood   Assessment & Plan:  Assessment: 1. Migraine without aura and without status migrainosus, not intractable      Plan: Increase gabapentin to 400mg  tid for prevention  Applauded for working toward weight loss Can increase reyvow to 200mg  prn severe migraine.  Pt cautioned again to not drive/operate heavy machinery for 8 hours following administration. Also do not combine with chlorpromazine or other sedating medications.  Do not combine chlorpromazine with promethazine.  Follow-up in 22months or sooner PRN  Paticia Stack, PA-C 09/08/2021 8:21 AM

## 2021-09-09 ENCOUNTER — Other Ambulatory Visit (HOSPITAL_COMMUNITY): Payer: Self-pay

## 2021-09-09 ENCOUNTER — Other Ambulatory Visit: Payer: Self-pay

## 2021-09-09 MED ORDER — MELOXICAM 15 MG PO TABS
ORAL_TABLET | ORAL | 2 refills | Status: DC
  Filled 2021-09-09 – 2022-03-21 (×2): qty 90, 90d supply, fill #0
  Filled 2022-06-05: qty 90, 90d supply, fill #1

## 2021-09-11 ENCOUNTER — Encounter: Payer: Self-pay | Admitting: Orthopaedic Surgery

## 2021-09-11 ENCOUNTER — Other Ambulatory Visit (HOSPITAL_COMMUNITY): Payer: Self-pay

## 2021-09-12 ENCOUNTER — Other Ambulatory Visit (HOSPITAL_COMMUNITY): Payer: Self-pay

## 2021-09-12 ENCOUNTER — Encounter (HOSPITAL_BASED_OUTPATIENT_CLINIC_OR_DEPARTMENT_OTHER): Payer: Self-pay | Admitting: Orthopaedic Surgery

## 2021-09-12 ENCOUNTER — Other Ambulatory Visit: Payer: Self-pay

## 2021-09-14 ENCOUNTER — Other Ambulatory Visit (HOSPITAL_COMMUNITY): Payer: Self-pay

## 2021-09-14 DIAGNOSIS — Z6839 Body mass index (BMI) 39.0-39.9, adult: Secondary | ICD-10-CM | POA: Diagnosis not present

## 2021-09-14 DIAGNOSIS — N951 Menopausal and female climacteric states: Secondary | ICD-10-CM | POA: Diagnosis not present

## 2021-09-14 DIAGNOSIS — R6882 Decreased libido: Secondary | ICD-10-CM | POA: Diagnosis not present

## 2021-09-14 DIAGNOSIS — R232 Flushing: Secondary | ICD-10-CM | POA: Diagnosis not present

## 2021-09-14 MED ORDER — LIOTHYRONINE SODIUM 5 MCG PO TABS
ORAL_TABLET | ORAL | 2 refills | Status: DC
  Filled 2021-09-14: qty 60, 30d supply, fill #0

## 2021-09-19 ENCOUNTER — Encounter (HOSPITAL_BASED_OUTPATIENT_CLINIC_OR_DEPARTMENT_OTHER)
Admission: RE | Admit: 2021-09-19 | Discharge: 2021-09-19 | Disposition: A | Discharge status: Home / Self Care | Payer: 59 | Source: Ambulatory Visit | Attending: Orthopaedic Surgery | Admitting: Orthopaedic Surgery

## 2021-09-19 DIAGNOSIS — M94262 Chondromalacia, left knee: Secondary | ICD-10-CM | POA: Diagnosis not present

## 2021-09-19 DIAGNOSIS — X58XXXA Exposure to other specified factors, initial encounter: Secondary | ICD-10-CM | POA: Diagnosis not present

## 2021-09-19 DIAGNOSIS — S83242A Other tear of medial meniscus, current injury, left knee, initial encounter: Secondary | ICD-10-CM | POA: Diagnosis not present

## 2021-09-19 LAB — BASIC METABOLIC PANEL
Anion gap: 8 (ref 5–15)
BUN: 14 mg/dL (ref 6–20)
CO2: 24 mmol/L (ref 22–32)
Calcium: 8.9 mg/dL (ref 8.9–10.3)
Chloride: 108 mmol/L (ref 98–111)
Creatinine, Ser: 0.88 mg/dL (ref 0.44–1.00)
GFR, Estimated: >60 mL/min (ref >60)
Glucose, Bld: 89 mg/dL (ref 70–99)
Potassium: 3.7 mmol/L (ref 3.5–5.1)
Sodium: 140 mmol/L (ref 135–145)

## 2021-09-20 ENCOUNTER — Other Ambulatory Visit: Payer: Self-pay

## 2021-09-20 ENCOUNTER — Ambulatory Visit (HOSPITAL_BASED_OUTPATIENT_CLINIC_OR_DEPARTMENT_OTHER): Payer: 59 | Admitting: Certified Registered"

## 2021-09-20 ENCOUNTER — Encounter: Payer: Self-pay | Admitting: Orthopaedic Surgery

## 2021-09-20 ENCOUNTER — Encounter (HOSPITAL_BASED_OUTPATIENT_CLINIC_OR_DEPARTMENT_OTHER)
Admission: RE | Disposition: A | Discharge status: Home / Self Care | Payer: Self-pay | Source: Home / Self Care | Attending: Orthopaedic Surgery

## 2021-09-20 ENCOUNTER — Ambulatory Visit (HOSPITAL_BASED_OUTPATIENT_CLINIC_OR_DEPARTMENT_OTHER)
Admission: RE | Admit: 2021-09-20 | Discharge: 2021-09-20 | Disposition: A | Discharge status: Home / Self Care | Payer: 59 | Attending: Orthopaedic Surgery | Admitting: Orthopaedic Surgery

## 2021-09-20 ENCOUNTER — Other Ambulatory Visit (HOSPITAL_COMMUNITY): Payer: Self-pay

## 2021-09-20 ENCOUNTER — Encounter (HOSPITAL_BASED_OUTPATIENT_CLINIC_OR_DEPARTMENT_OTHER): Payer: Self-pay | Admitting: Orthopaedic Surgery

## 2021-09-20 DIAGNOSIS — S83242A Other tear of medial meniscus, current injury, left knee, initial encounter: Secondary | ICD-10-CM | POA: Insufficient documentation

## 2021-09-20 DIAGNOSIS — M23331 Other meniscus derangements, other medial meniscus, right knee: Secondary | ICD-10-CM | POA: Diagnosis not present

## 2021-09-20 DIAGNOSIS — S838X2A Sprain of other specified parts of left knee, initial encounter: Secondary | ICD-10-CM | POA: Diagnosis not present

## 2021-09-20 DIAGNOSIS — M94262 Chondromalacia, left knee: Secondary | ICD-10-CM | POA: Insufficient documentation

## 2021-09-20 DIAGNOSIS — X58XXXA Exposure to other specified factors, initial encounter: Secondary | ICD-10-CM | POA: Insufficient documentation

## 2021-09-20 DIAGNOSIS — Z794 Long term (current) use of insulin: Secondary | ICD-10-CM | POA: Diagnosis not present

## 2021-09-20 DIAGNOSIS — E119 Type 2 diabetes mellitus without complications: Secondary | ICD-10-CM | POA: Diagnosis not present

## 2021-09-20 DIAGNOSIS — K219 Gastro-esophageal reflux disease without esophagitis: Secondary | ICD-10-CM | POA: Diagnosis not present

## 2021-09-20 HISTORY — PX: KNEE ARTHROSCOPY WITH MENISCAL REPAIR: SHX5653

## 2021-09-20 LAB — POCT PREGNANCY, URINE: Preg Test, Ur: NEGATIVE

## 2021-09-20 LAB — GLUCOSE, CAPILLARY
Glucose-Capillary: 118 mg/dL — ABNORMAL HIGH (ref 70–99)
Glucose-Capillary: 138 mg/dL — ABNORMAL HIGH (ref 70–99)

## 2021-09-20 SURGERY — ARTHROSCOPY, KNEE, WITH MENISCUS REPAIR
Anesthesia: General | Site: Knee | Laterality: Left

## 2021-09-20 MED ORDER — DEXAMETHASONE SODIUM PHOSPHATE 10 MG/ML IJ SOLN
INTRAMUSCULAR | Status: AC
  Filled 2021-09-20: qty 1

## 2021-09-20 MED ORDER — FENTANYL CITRATE (PF) 100 MCG/2ML IJ SOLN
INTRAMUSCULAR | Status: DC | PRN
  Administered 2021-09-20 (×2): 50 ug via INTRAVENOUS

## 2021-09-20 MED ORDER — HYDROMORPHONE HCL 1 MG/ML IJ SOLN
INTRAMUSCULAR | Status: AC
  Filled 2021-09-20: qty 0.5

## 2021-09-20 MED ORDER — MEPERIDINE HCL 25 MG/ML IJ SOLN: 6.2500 mg | INTRAMUSCULAR | Status: DC | PRN

## 2021-09-20 MED ORDER — CEFAZOLIN SODIUM-DEXTROSE 2-4 GM/100ML-% IV SOLN
INTRAVENOUS | Status: AC
  Filled 2021-09-20: qty 100

## 2021-09-20 MED ORDER — BUPIVACAINE HCL (PF) 0.25 % IJ SOLN
INTRAMUSCULAR | Status: DC | PRN
  Administered 2021-09-20: 20 mL

## 2021-09-20 MED ORDER — MIDAZOLAM HCL 5 MG/5ML IJ SOLN
INTRAMUSCULAR | Status: DC | PRN
  Administered 2021-09-20: 2 mg via INTRAVENOUS

## 2021-09-20 MED ORDER — LACTATED RINGERS IV SOLN: INTRAVENOUS | Status: DC

## 2021-09-20 MED ORDER — FENTANYL CITRATE (PF) 100 MCG/2ML IJ SOLN
INTRAMUSCULAR | Status: AC
  Filled 2021-09-20: qty 2

## 2021-09-20 MED ORDER — PROMETHAZINE HCL 25 MG/ML IJ SOLN: 6.2500 mg | INTRAMUSCULAR | Status: DC | PRN

## 2021-09-20 MED ORDER — HYDROCODONE-ACETAMINOPHEN 5-325 MG PO TABS
1.0000 | ORAL_TABLET | Freq: Four times a day (QID) | ORAL | 0 refills | Status: DC | PRN
  Filled 2021-09-20: qty 20, 5d supply, fill #0

## 2021-09-20 MED ORDER — DROPERIDOL 2.5 MG/ML IJ SOLN
INTRAMUSCULAR | Status: AC
  Filled 2021-09-20: qty 2

## 2021-09-20 MED ORDER — HYDROMORPHONE HCL 1 MG/ML IJ SOLN
0.2500 mg | INTRAMUSCULAR | Status: DC | PRN
  Administered 2021-09-20 (×4): 0.5 mg via INTRAVENOUS

## 2021-09-20 MED ORDER — DROPERIDOL 2.5 MG/ML IJ SOLN
INTRAMUSCULAR | Status: DC | PRN
  Administered 2021-09-20: .625 mg via INTRAVENOUS

## 2021-09-20 MED ORDER — OXYCODONE HCL 5 MG PO TABS
5.0000 mg | ORAL_TABLET | Freq: Once | ORAL | Status: AC | PRN
  Administered 2021-09-20: 16:00:00 5 mg via ORAL

## 2021-09-20 MED ORDER — OXYCODONE HCL 5 MG PO TABS
ORAL_TABLET | ORAL | Status: AC
  Filled 2021-09-20: qty 1

## 2021-09-20 MED ORDER — ONDANSETRON HCL 4 MG/2ML IJ SOLN
INTRAMUSCULAR | Status: AC
  Filled 2021-09-20: qty 2

## 2021-09-20 MED ORDER — DEXAMETHASONE SODIUM PHOSPHATE 4 MG/ML IJ SOLN
INTRAMUSCULAR | Status: DC | PRN
  Administered 2021-09-20: 4 mg via INTRAVENOUS

## 2021-09-20 MED ORDER — ONDANSETRON HCL 4 MG/2ML IJ SOLN
INTRAMUSCULAR | Status: DC | PRN
  Administered 2021-09-20: 4 mg via INTRAVENOUS

## 2021-09-20 MED ORDER — PROPOFOL 10 MG/ML IV BOLUS
INTRAVENOUS | Status: AC
  Filled 2021-09-20: qty 20

## 2021-09-20 MED ORDER — LIDOCAINE 2% (20 MG/ML) 5 ML SYRINGE
INTRAMUSCULAR | Status: DC | PRN
  Administered 2021-09-20: 60 mg via INTRAVENOUS

## 2021-09-20 MED ORDER — CEFAZOLIN SODIUM-DEXTROSE 2-4 GM/100ML-% IV SOLN
2.0000 g | INTRAVENOUS | Status: AC
  Administered 2021-09-20: 14:00:00 2 g via INTRAVENOUS

## 2021-09-20 MED ORDER — KETOROLAC TROMETHAMINE 30 MG/ML IJ SOLN
INTRAMUSCULAR | Status: AC
  Filled 2021-09-20: qty 1

## 2021-09-20 MED ORDER — MIDAZOLAM HCL 2 MG/2ML IJ SOLN
INTRAMUSCULAR | Status: AC
  Filled 2021-09-20: qty 2

## 2021-09-20 MED ORDER — LIDOCAINE 2% (20 MG/ML) 5 ML SYRINGE
INTRAMUSCULAR | Status: AC
  Filled 2021-09-20: qty 5

## 2021-09-20 MED ORDER — KETOROLAC TROMETHAMINE 30 MG/ML IJ SOLN
INTRAMUSCULAR | Status: DC | PRN
  Administered 2021-09-20: 30 mg via INTRAVENOUS

## 2021-09-20 MED ORDER — OXYCODONE HCL 5 MG/5ML PO SOLN: 5.0000 mg | Freq: Once | ORAL | Status: AC | PRN

## 2021-09-20 MED ORDER — PROPOFOL 10 MG/ML IV BOLUS
INTRAVENOUS | Status: DC | PRN
  Administered 2021-09-20: 200 mg via INTRAVENOUS

## 2021-09-20 MED ORDER — SODIUM CHLORIDE 0.9 % IR SOLN
Status: DC | PRN
  Administered 2021-09-20: 2000 mL

## 2021-09-20 SURGICAL SUPPLY — 37 items
BANDAGE ESMARK 6X9 LF (GAUZE/BANDAGES/DRESSINGS) IMPLANT
BLADE EXCALIBUR 4.0MM X 13CM (MISCELLANEOUS)
BLADE EXCALIBUR 4.0X13 (MISCELLANEOUS) IMPLANT
BLADE SHAVER TORPEDO 4X13 (MISCELLANEOUS) ×2 IMPLANT
BNDG CMPR 9X6 STRL LF SNTH (GAUZE/BANDAGES/DRESSINGS)
BNDG ELASTIC 6X5.8 VLCR STR LF (GAUZE/BANDAGES/DRESSINGS) ×6 IMPLANT
BNDG ESMARK 6X9 LF (GAUZE/BANDAGES/DRESSINGS)
COOLER ICEMAN CLASSIC (MISCELLANEOUS) ×3 IMPLANT
CUFF TOURN SGL QUICK 34 (TOURNIQUET CUFF) ×3
CUFF TRNQT CYL 34X4.125X (TOURNIQUET CUFF) ×1 IMPLANT
DRAPE ARTHROSCOPY W/POUCH 90 (DRAPES) ×3 IMPLANT
DRAPE IMP U-DRAPE 54X76 (DRAPES) ×3 IMPLANT
DRAPE U-SHAPE 47X51 STRL (DRAPES) ×3 IMPLANT
DURAPREP 26ML APPLICATOR (WOUND CARE) ×3 IMPLANT
GAUZE 4X4 16PLY ~~LOC~~+RFID DBL (SPONGE) ×2 IMPLANT
GAUZE SPONGE 4X4 12PLY STRL (GAUZE/BANDAGES/DRESSINGS) ×3 IMPLANT
GAUZE XEROFORM 1X8 LF (GAUZE/BANDAGES/DRESSINGS) ×3 IMPLANT
GLOVE SURG NEOP MICRO LF SZ7.5 (GLOVE) ×1 IMPLANT
GLOVE SURG POLYISO LF SZ7 (GLOVE) ×2 IMPLANT
GLOVE SURG SYN 7.5  E (GLOVE) ×2
GLOVE SURG SYN 7.5 E (GLOVE) ×1 IMPLANT
GLOVE SURG SYN 7.5 PF PI (GLOVE) ×1 IMPLANT
GLOVE SURG UNDER POLY LF SZ7 (GLOVE) ×9 IMPLANT
GLOVE SURG UNDER POLY LF SZ7.5 (GLOVE) ×3 IMPLANT
GOWN STRL REIN XL XLG (GOWN DISPOSABLE) ×5 IMPLANT
GOWN STRL REUS W/ TWL LRG LVL3 (GOWN DISPOSABLE) ×1 IMPLANT
GOWN STRL REUS W/ TWL XL LVL3 (GOWN DISPOSABLE) ×1 IMPLANT
GOWN STRL REUS W/TWL LRG LVL3 (GOWN DISPOSABLE) ×3
GOWN STRL REUS W/TWL XL LVL3 (GOWN DISPOSABLE)
MANIFOLD NEPTUNE II (INSTRUMENTS) ×3 IMPLANT
PACK ARTHROSCOPY DSU (CUSTOM PROCEDURE TRAY) ×3 IMPLANT
PACK BASIN DAY SURGERY FS (CUSTOM PROCEDURE TRAY) ×3 IMPLANT
PAD COLD SHLDR WRAP-ON (PAD) ×3 IMPLANT
SHEET MEDIUM DRAPE 40X70 STRL (DRAPES) ×3 IMPLANT
SUT ETHILON 3 0 PS 1 (SUTURE) ×3 IMPLANT
TOWEL GREEN STERILE FF (TOWEL DISPOSABLE) ×3 IMPLANT
TUBING ARTHROSCOPY IRRIG 16FT (MISCELLANEOUS) ×3 IMPLANT

## 2021-09-20 NOTE — Anesthesia Procedure Notes (Signed)
Procedure Name: LMA Insertion Date/Time: 09/20/2021 2:23 PM Performed by: Ezequiel Kayser, CRNA Pre-anesthesia Checklist: Patient identified, Emergency Drugs available, Suction available and Patient being monitored Patient Re-evaluated:Patient Re-evaluated prior to induction Oxygen Delivery Method: Circle System Utilized Preoxygenation: Pre-oxygenation with 100% oxygen Induction Type: IV induction Ventilation: Mask ventilation without difficulty LMA: LMA inserted LMA Size: 4.0 Number of attempts: 1 Airway Equipment and Method: Bite block Placement Confirmation: positive ETCO2 Tube secured with: Tape Dental Injury: Teeth and Oropharynx as per pre-operative assessment

## 2021-09-20 NOTE — Anesthesia Preprocedure Evaluation (Signed)
Anesthesia Evaluation  Patient identified by MRN, date of birth, ID band Patient awake    Reviewed: Allergy & Precautions, NPO status , Patient's Chart, lab work & pertinent test results  History of Anesthesia Complications (+) PONV  Airway Mallampati: I       Dental no notable dental hx. (+) Teeth Intact   Pulmonary asthma , sleep apnea ,    Pulmonary exam normal breath sounds clear to auscultation       Cardiovascular hypertension, Pt. on medications Normal cardiovascular exam Rhythm:Regular Rate:Normal     Neuro/Psych  Headaches, Anxiety    GI/Hepatic Neg liver ROS, GERD  ,  Endo/Other  diabetes, Well Controlled, Type 2, Insulin Dependent  Renal/GU   negative genitourinary   Musculoskeletal negative musculoskeletal ROS (+)   Abdominal (+) + obese,   Peds  Hematology negative hematology ROS (+)   Anesthesia Other Findings   Reproductive/Obstetrics                             Anesthesia Physical  Anesthesia Plan  ASA: 3  Anesthesia Plan: General   Post-op Pain Management:    Induction: Intravenous  PONV Risk Score and Plan: 4 or greater and Ondansetron, Dexamethasone, Midazolam and Droperidol  Airway Management Planned: LMA  Additional Equipment: None  Intra-op Plan:   Post-operative Plan: Extubation in OR  Informed Consent: I have reviewed the patients History and Physical, chart, labs and discussed the procedure including the risks, benefits and alternatives for the proposed anesthesia with the patient or authorized representative who has indicated his/her understanding and acceptance.     Dental advisory given  Plan Discussed with: CRNA  Anesthesia Plan Comments:         Anesthesia Quick Evaluation

## 2021-09-20 NOTE — H&P (Signed)
PREOPERATIVE H&P  Chief Complaint: left knee medial meniscal tear  HPI: Julie Hansen is a 50 y.o. female who presents for surgical treatment of left knee medial meniscal tear.  She denies any changes in medical history.  Past Medical History:  Diagnosis Date   Acid reflux    Asthma    Diabetes mellitus without complication (HCC)    Hyperlipidemia    PCOS (polycystic ovarian syndrome)    PONV (postoperative nausea and vomiting)    Sleep apnea    uses cpap   Past Surgical History:  Procedure Laterality Date   BREAST EXCISIONAL BIOPSY Right 06/28/2020   papilloma   DILATION AND EVACUATION N/A 06/05/2017   Procedure: DILATATION AND EVACUATION;  Surgeon: Sanjuana Kava, MD;  Location: [facility];  Service: Gynecology;  Laterality: N/A;   RADIOACTIVE SEED GUIDED EXCISIONAL BREAST BIOPSY Right 06/28/2020   Procedure: RIGHT RADIOACTIVE SEED GUIDED EXCISIONAL BREAST BIOPSY;  Surgeon: Rolm Bookbinder, MD;  Location: [facility];  Service: General;  Laterality: Right;   WISDOM TOOTH EXTRACTION     Social History   Socioeconomic History   Marital status: Married    Spouse name: Not on file   Number of children: Not on file   Years of education: Not on file   Highest education level: Not on file  Occupational History   Not on file  Tobacco Use   Smoking status: Never   Smokeless tobacco: Never  Vaping Use   Vaping Use: Never used  Substance and Sexual Activity   Alcohol use: Yes    Comment: occas wine   Drug use: No   Sexual activity: Yes    Birth control/protection: None  Other Topics Concern   Not on file  Social History Narrative   Not on file   Social Determinants of Health   Financial Resource Strain: Not on file  Food Insecurity: Not on file  Transportation Needs: Not on file  Physical Activity: Not on file  Stress: Not on file  Social Connections: Not on file   Family History  Problem Relation Age of Onset   Cancer Mother     Hypertension Mother    Diabetes Mother    Breast cancer Mother 75   Cancer Maternal Grandmother    Breast cancer Maternal Grandmother 51   Cancer Father    Diabetes Father    Hypertension Father    Breast cancer Paternal Aunt 23   Breast cancer Paternal Aunt 17   Breast cancer Paternal Aunt 19   Allergies  Allergen Reactions   Macrodantin [Nitrofurantoin Macrocrystal] Itching   Carbamazepine Other (See Comments)    Vaginal discharge   Prior to Admission medications   Medication Sig Start Date End Date Taking? Authorizing Provider  ALPRAZolam Duanne Moron) 0.25 MG tablet Take 1 tablet by mouth daily as needed 02/28/21  Yes   atorvastatin (LIPITOR) 40 MG tablet Take one tablet (40 mg dose) by mouth daily. 03/21/21  Yes   baclofen (LIORESAL) 10 MG tablet TAKE 1 TABLET BY MOUTH DAILY AS NEEDED UP TO 3 TIMES DAILY 09/08/21 09/08/22 Yes Teague Bobbye Morton, PA-C  beclomethasone (QVAR REDIHALER) 40 MCG/ACT inhaler Inhale 2 puffs into the lungs 2 times daily 04/11/20  Yes   cetirizine (ZYRTEC ALLERGY) 10 MG tablet Take 1 tablet (10 mg total) by mouth daily. 09/20/20  Yes Lamptey, Myrene Galas, MD  chlorproMAZINE (THORAZINE) 25 MG tablet TAKE 1 TABLET BY MOUTH AS NEEDED FOR HEADACHE 09/08/21 09/08/22 Yes Teague Hester Mates  E, PA-C  Continuous Blood Gluc Receiver (DEXCOM G6 RECEIVER) DEVI as directed 03/02/21  Yes Balan, Bindubal, MD  Continuous Blood Gluc Sensor (DEXCOM G6 SENSOR) MISC use as directed subcutaneous every 10 days 03/02/21  Yes   Continuous Blood Gluc Sensor (FREESTYLE LIBRE 2 SENSOR) MISC Use as directed every 14 days 08/14/21  Yes   Continuous Blood Gluc Sensor (Teller) MISC Use as directed every 14 days 01/25/21  Yes   Continuous Blood Gluc Transmit (DEXCOM G6 TRANSMITTER) MISC use every 90 days 03/02/21  Yes   dapagliflozin propanediol (FARXIGA) 10 MG TABS tablet Take 1 tablet by mouth daily 05/16/21  Yes   dexlansoprazole (DEXILANT) 60 MG capsule Take 1 capsule (60 mg  total) by mouth daily 20 minutes before breakfast. 02/03/21  Yes   fenofibrate (TRICOR) 48 MG tablet TAKE ONE TABLET BY MOUTH DAILY 11/09/20 11/09/21 Yes Chesley Noon, MD  fluticasone (FLONASE) 50 MCG/ACT nasal spray USE 1 SPRAY INTO BOTH NOSTRILS DAILY. 06/23/21 06/23/22 Yes Lamptey, Myrene Galas, MD  gabapentin (NEURONTIN) 400 MG capsule Take 1 capsule (400 mg total) by mouth 3 (three) times daily. 09/08/21  Yes Teague Bobbye Morton, PA-C  glucose blood test strip Use to check blood sugar 3 times a day 01/25/21  Yes   hydrOXYzine (ATARAX) 10 MG tablet Take 2 tablets by mouth at bedtime 06/08/21  Yes   ibuprofen (ADVIL,MOTRIN) 800 MG tablet Take 1 tablet (800 mg total) by mouth every 8 (eight) hours as needed. 06/05/17  Yes Sanjuana Kava, MD  insulin degludec (TRESIBA FLEXTOUCH) 200 UNIT/ML FlexTouch Pen Inject into the skin 32 units in the morning and 74 units in the evening 08/10/21  Yes   insulin lispro (HUMALOG) 100 UNIT/ML KwikPen INJECT 20 - 25 UNITS SUBCUTANEOUS 3 TIMES A DAY 12/08/20 12/08/21 Yes Balan, Bindubal, MD  Insulin Pen Needle 32G X 4 MM MISC USE AS DIRECTED 4 TIMES A DAY 12/07/20 12/07/21 Yes Balan, Mindi Curling, MD  Lasmiditan Succinate (REYVOW) 100 MG TABS Take 2 tablets (200 mg) by mouth as needed for headache 09/08/21  Yes Teague Bobbye Morton, PA-C  linaclotide (LINZESS) 290 MCG CAPS capsule Take 1 capsule by mouth 30 minutes before the first meal 02/02/21  Yes   lisinopril (ZESTRIL) 5 MG tablet Take one tablet (5 mg dose) by mouth daily. 08/31/21  Yes   meloxicam (MOBIC) 15 MG tablet Take 1 tablet (15 mg total) by mouth daily with a meal 09/08/21  Yes   promethazine (PHENERGAN) 25 MG tablet TAKE 1 TABLET BY MOUTH EVERY 6 HOURS AS NEEDED. 09/08/21 09/08/22 Yes Teague Bobbye Morton, PA-C  QUEtiapine (SEROQUEL XR) 300 MG 24 hr tablet Take 2 every evening 06/08/21  Yes   tirzepatide (MOUNJARO) 5 MG/0.5ML Pen Inject 5 mg into the skin once a week 08/10/21  Yes   traMADol (ULTRAM) 50 MG tablet take  one tablet by mouth every 6 hours as needed for pain 05/04/21  Yes Earnestine Leys, MD  Vitamin D, Ergocalciferol, (DRISDOL) 1.25 MG (50000 UNIT) CAPS capsule TAKE ONE CAPSULE BY MOUTH TWO TIMES A WEEK 07/31/20 10/16/21 Yes Chesley Noon, MD  zolpidem (AMBIEN CR) 12.5 MG CR tablet Take 1 tablet (12.5 mg total) by mouth at bedtime. 09/25/21  Yes   Accu-Chek FastClix Lancets MISC Use 4 times a day 08/14/21     albuterol (PROVENTIL) (2.5 MG/3ML) 0.083% nebulizer solution Take 2.5 mg by nebulization every 6 (six) hours as needed.    [provider]  ALPRAZolam (XANAX) 0.25 MG tablet Take 1 tablet by mouth daily as needed Patient not taking: Reported on 08/11/2021 06/08/21     ALPRAZolam (XANAX) 0.25 MG tablet Take 1 tablet by mouth daily as needed Patient not taking: Reported on 08/11/2021 06/28/21     dapagliflozin propanediol (FARXIGA) 10 MG TABS tablet Take 1 tablet by mouth daily Patient not taking: Reported on 08/11/2021 06/30/21     insulin degludec (TRESIBA) 200 UNIT/ML FlexTouch Pen Inject 26 units into the skin in the morning and 60 units in the evening Patient not taking: Reported on 08/11/2021 05/01/21     linaclotide (LINZESS) 290 MCG CAPS capsule Take 1 capsule by mouth 30 minutes before the first meal 02/02/21     liothyronine (CYTOMEL) 5 MCG tablet Take 1 tablet by mouth every morning on an empty stomach increase to 2 tablets in 7 days as directed 09/14/21     QUEtiapine (SEROQUEL XR) 300 MG 24 hr tablet Take 2 tablets by mouth in the evening Patient not taking: Reported on 08/11/2021 06/29/21     zolpidem (AMBIEN CR) 12.5 MG CR tablet Take 1 by mouth at bedtime 06/08/21     zolpidem (AMBIEN CR) 12.5 MG CR tablet Take 1 tablet by mouth at bedtime. Patient not taking: Reported on 08/11/2021 06/28/21        Positive ROS: All other systems have been reviewed and were otherwise negative with the exception of those mentioned in the HPI and as above.  Physical Exam: General: Alert, no  acute distress Cardiovascular: No pedal edema Respiratory: No cyanosis, no use of accessory musculature GI: abdomen soft Skin: No lesions in the area of chief complaint Neurologic: Sensation intact distally Psychiatric: Patient is competent for consent with normal mood and affect Lymphatic: no lymphedema  MUSCULOSKELETAL: exam stable  Assessment: left knee medial meniscal tear  Plan: Plan for Procedure(s): LEFT KNEE PARTIAL MEDIAL MENISCECTOMY  The risks benefits and alternatives were discussed with the patient including but not limited to the risks of nonoperative treatment, versus surgical intervention including infection, bleeding, nerve injury,  blood clots, cardiopulmonary complications, morbidity, mortality, among others, and they were willing to proceed.   Preoperative templating of the joint replacement has been completed, documented, and submitted to the Operating Room personnel in order to optimize intra-operative equipment management.   Eduard Roux, MD 09/20/2021 12:30 PM

## 2021-09-20 NOTE — Anesthesia Postprocedure Evaluation (Signed)
Anesthesia Post Note  Patient: Emalee Covington-Adams  Procedure(s) Performed: LEFT KNEE PARTIAL MEDIAL MENISCECTOMY (Left: Knee)     Patient location during evaluation: PACU Anesthesia Type: General Level of consciousness: awake and alert Pain management: pain level controlled Vital Signs Assessment: post-procedure vital signs reviewed and stable Respiratory status: spontaneous breathing, nonlabored ventilation and respiratory function stable Cardiovascular status: blood pressure returned to baseline and stable Postop Assessment: no apparent nausea or vomiting Anesthetic complications: no   No notable events documented.  Last Vitals:  Vitals:   09/20/21 1530 09/20/21 1545  BP: 124/66 124/64  Pulse: 99 95  Resp: 13 19  Temp:    SpO2: 94% 92%    Last Pain:  Vitals:   09/20/21 1539  TempSrc:   PainSc: [place]

## 2021-09-20 NOTE — Transfer of Care (Signed)
Immediate Anesthesia Transfer of Care Note  Patient: Julie Hansen  Procedure(s) Performed: LEFT KNEE PARTIAL MEDIAL MENISCECTOMY (Left: Knee)  Patient Location: PACU  Anesthesia Type:General  Level of Consciousness: drowsy  Airway & Oxygen Therapy: Patient Spontanous Breathing and Patient connected to face mask oxygen  Post-op Assessment: Report given to RN and Post -op Vital signs reviewed and stable  Post vital signs: Reviewed and stable  Last Vitals:  Vitals Value Taken Time  BP    Temp    Pulse 98 09/20/21 1510  Resp 7 09/20/21 1510  SpO2 98 % 09/20/21 1510  Vitals shown include unvalidated device data.  Last Pain:  Vitals:   09/20/21 1248  TempSrc: Oral  PainSc: 0-No pain      Patients Stated Pain Goal: 7 (02/01/01 1117)  Complications: No notable events documented.

## 2021-09-20 NOTE — Discharge Instructions (Addendum)
Post-operative patient instructions  Knee Arthroscopy   Ice:  Place intermittent ice or cooler pack over your knee, 30 minutes on and 30 minutes off.  Continue this for the first 72 hours after surgery, then save ice for use after therapy sessions or on more active days.   Weight:  You may bear weight on your leg as your symptoms allow. Crutches:  Use crutches (or walker) to assist in walking until told to discontinue by your physical therapist or physician. This will help to reduce pain. Strengthening:  Perform simple thigh squeezes (isometric quad contractions) and straight leg lifts as you are able (3 sets of 5 to 10 repetitions, 3 times a day).  For the leg lifts, have someone support under your ankle in the beginning until you have increased strength enough to do this on your own.  To help get started on thigh squeezes, place a pillow under your knee and push down on the pillow with back of knee (sometimes easier to do than with your leg fully straight). Motion:  Perform gentle knee motion as tolerated - this is gentle bending and straightening of the knee. Seated heel slides: you can start by sitting in a chair, remove your brace, and gently slide your heel back on the floor - allowing your knee to bend. Have someone help you straighten your knee (or use your other leg/foot hooked under your ankle.  Dressing:  Perform 1st dressing change at 2 days postoperative. A moderate amount of blood tinged drainage is to be expected.  So if you bleed through the dressing on the first or second day or if you have fevers, it is fine to change the dressing/check the wounds early and redress wound. Elevate your leg.  If it bleeds through again, or if the incisions are leaking frank blood, please call the office. May change dressing every 1-2 days thereafter to help watch wounds. Can purchase Tegaderm (or 43M Nexcare) water resistant dressings at local pharmacy / [facility]. Shower:  Light shower is ok after 2  days.  Please take shower, NO bath. Recover with gauze and ace wrap to help keep wounds protected.   Pain medication:  A narcotic pain medication has been prescribed.  Take as directed.  Typically you need narcotic pain medication more regularly during the first 3 to 5 days after surgery.  Decrease your use of the medication as the pain improves.  Narcotics can sometimes cause constipation, even after a few doses.  If you have problems with constipation, you can take an over the counter stool softener or light laxative.  If you have persistent problems, please notify your physicians office. Physical therapy: Additional activity guidelines to be provided by your physician or physical therapist at follow-up visits.  Driving: Do not recommend driving x 2 weeks post surgical, especially if surgery performed on right side. Should not drive while taking narcotic pain medications. It typically takes at least 2 weeks to restore sufficient neuromuscular function for normal reaction times for driving safety.  Call [phone] for questions or problems. Evenings you will be forwarded to the hospital operator.  Ask for the orthopaedic physician on call. Please call if you experience:    Redness, foul smelling, or persistent drainage from the surgical site  worsening knee pain and swelling not responsive to medication  any calf pain and or swelling of the lower leg  temperatures greater than 101.5 F other questions or concerns   Thank you for allowing [place] to be  a part of your care.  No ibuprofen/motrin until after 11:15pm tonight, if needed.   Post Anesthesia Home Care Instructions  Activity: Get plenty of rest for the remainder of the day. A responsible individual must stay with you for 24 hours following the procedure.  For the next 24 hours, DO NOT: -Drive a car -Paediatric nurse -Drink alcoholic beverages -Take any medication unless instructed by your physician -Make any legal decisions or sign  important papers.  Meals: Start with liquid foods such as gelatin or soup. Progress to regular foods as tolerated. Avoid greasy, spicy, heavy foods. If nausea and/or vomiting occur, drink only clear liquids until the nausea and/or vomiting subsides. Call your physician if vomiting continues.  Special Instructions/Symptoms: Your throat may feel dry or sore from the anesthesia or the breathing tube placed in your throat during surgery. If this causes discomfort, gargle with warm salt water. The discomfort should disappear within 24 hours.  If you had a scopolamine patch placed behind your ear for the management of post- operative nausea and/or vomiting:  1. The medication in the patch is effective for 72 hours, after which it should be removed.  Wrap patch in a tissue and discard in the trash. Wash hands thoroughly with soap and water. 2. You may remove the patch earlier than 72 hours if you experience unpleasant side effects which may include dry mouth, dizziness or visual disturbances. 3. Avoid touching the patch. Wash your hands with soap and water after contact with the patch.

## 2021-09-21 ENCOUNTER — Encounter (HOSPITAL_BASED_OUTPATIENT_CLINIC_OR_DEPARTMENT_OTHER): Payer: Self-pay | Admitting: Orthopaedic Surgery

## 2021-09-22 NOTE — Op Note (Signed)
° °  Surgery Date: 09/20/2021  PREOPERATIVE DIAGNOSES:  1. Left knee medial meniscus tear 2. Left knee chondromalacia  POSTOPERATIVE DIAGNOSES:  same  PROCEDURES PERFORMED:  1. Left knee arthroscopy with limited synovectomy 2. Left knee arthroscopy with arthroscopic partial medial meniscectomy 3. Left knee arthroscopy with arthroscopic chondroplasty medial femoral condyle.  SURGEON: N. Eduard Roux, M.D.  ASSIST: Ciro Backer [place], [place]; necessary for the timely completion of procedure and due to complexity of procedure.  ANESTHESIA:  general  FLUIDS: Per anesthesia record.   ESTIMATED BLOOD LOSS: minimal  DESCRIPTION OF PROCEDURE: Ms. Julie Hansen is a 50 y.o.-year-old female with above mentioned conditions. Full discussion held regarding risks benefits alternatives and complications related surgical intervention. Conservative care options reviewed. All questions answered.  The patient was identified in the preoperative holding area and the operative extremity was marked. The patient was brought to the operating room and transferred to operating table in a supine position. Satisfactory general anesthesia was induced by anesthesiology.    Standard anterolateral, anteromedial arthroscopy portals were obtained. The anteromedial portal was obtained with a spinal needle for localization under direct visualization with subsequent diagnostic findings.   Valgus stress was placed on the knee to evaluate the medial compartment.  There was grade 2-3 changes of the weightbearing surface of the medial femoral condyle and small areas of the medial tibial plateau.  We did find a small complex tear of the posterior horn the medial meniscus.  Partial meniscectomy was performed with a basket and oscillating shaver back to stable border.  I then thoroughly examined the rest of the medial meniscus for any occult tears but was unable to find any.  The cruciates were unremarkable.  Lateral compartment  was overall unremarkable.  Patellofemoral compartment showed grade 2-3 changes worse on the femoral side.  Gutters were checked for loose bodies.  Excess fluid was removed from the knee joint.  Incisions were closed with interrupted nylon sutures.  Sterile dressings were applied.  Patient tolerated procedure well had no immediate complications.  Suprapatellar pouch and gutters: no synovitis or debris. Patella chondral surface: Grade 2 Trochlear chondral surface: Grade 3 Patellofemoral tracking: Normal Medial meniscus: Small complex tear posterior horn.  Medial femoral condyle weight bearing surface: Grade 3 Medial tibial plateau: Grade 3 Anterior cruciate ligament:stable Posterior cruciate ligament:stable Lateral meniscus: Normal.   Lateral femoral condyle weight bearing surface: Grade 1 Lateral tibial plateau: Grade 1  DISPOSITION: The patient was awakened from general anesthetic, extubated, taken to the recovery room in medically stable condition, no apparent complications. The patient may be weightbearing as tolerated to the operative lower extremity.  Range of motion of right knee as tolerated.  Julie Cecil, MD [facility] 7:41 AM

## 2021-09-26 DIAGNOSIS — G4733 Obstructive sleep apnea (adult) (pediatric): Secondary | ICD-10-CM | POA: Diagnosis not present

## 2021-09-27 ENCOUNTER — Other Ambulatory Visit (HOSPITAL_COMMUNITY): Payer: Self-pay

## 2021-09-27 ENCOUNTER — Ambulatory Visit (INDEPENDENT_AMBULATORY_CARE_PROVIDER_SITE_OTHER): Payer: 59 | Admitting: Orthopaedic Surgery

## 2021-09-27 ENCOUNTER — Encounter: Payer: Self-pay | Admitting: Orthopaedic Surgery

## 2021-09-27 ENCOUNTER — Other Ambulatory Visit: Payer: Self-pay

## 2021-09-27 VITALS — BP 115/73 | HR 88 | Temp 98.3°F

## 2021-09-27 DIAGNOSIS — S838X2A Sprain of other specified parts of left knee, initial encounter: Secondary | ICD-10-CM

## 2021-09-27 MED ORDER — CELECOXIB 200 MG PO CAPS
200.0000 mg | ORAL_CAPSULE | Freq: Two times a day (BID) | ORAL | 3 refills | Status: DC
  Filled 2021-09-27: qty 30, 15d supply, fill #0
  Filled 2021-10-13: qty 30, 15d supply, fill #1
  Filled 2021-11-01: qty 30, 15d supply, fill #2

## 2021-09-27 NOTE — Progress Notes (Signed)
Post-Op Visit Note   Patient: Julie Hansen           Date of Birth: 1971/05/02           MRN: [id number] Visit Date: 09/27/2021 PCP: Chesley Noon, MD   Assessment & Plan:  Chief Complaint:  Chief Complaint  Patient presents with   Left Knee - Pain   Visit Diagnoses:  1. Acute medial meniscal injury of knee, left, initial encounter     Plan: Patient is 1 week status post left knee arthroscopy partial medial meniscectomy and chondroplasty.  She is doing well overall.  Not requiring any narcotic pain medications although her pain is more severe at night.  She feels tired with exertion.  Reports some mild shortness of breath with exertion.  Denies any chest pain.  Left knee examination shows healed surgical incisions.  No signs infection.  Moderate joint effusion.  She has full extension and about 70 degrees of flexion with moderate pain.  Mainly range of motion is limited by the effusion.  I recommend continued aggressive icing and I sent in prescription for Celebrex.  She is not ready to return back to work at this time as she is not physically ready.  She needs more time to heal from the surgery.  We did obtain a set of vital signs which were normal.  I explained that if she develops worsening shortness of breath she needs to be evaluated ASAP.  Handicap placard provided today.  Stitches removed today.  She needs to be out of work for at least 3 weeks and then we can reassess.  I explained that it is fine to take hydrocodone at night if the pain is worse during that time so it will help her sleep better.  Follow-Up Instructions: Return in about 3 weeks (around 10/18/2021).   Orders:  No orders of the defined types were placed in this encounter.  Meds ordered this encounter  Medications   celecoxib (CELEBREX) 200 MG capsule    Sig: Take 1 capsule (200 mg total) by mouth 2 (two) times daily.    Dispense:  30 capsule    Refill:  3    Imaging: No results  found.  PMFS History: Patient Active Problem List   Diagnosis Date Noted   Acute medial meniscal injury of knee, left, initial encounter 07/18/2021   Chronic migraine without aura without status migrainosus, not intractable 04/14/2021   Muscle spasm 06/20/2018   Unspecified dyspareunia (CODE) 03/20/2017   Amenorrhea 03/20/2017   Irritable bowel syndrome 01/01/2017   Status migrainosus 01/01/2017   Anxiety 01/01/2017   Vaginal discharge 12/17/2016   GERD (gastroesophageal reflux disease) 10/30/2016   Mixed hyperlipidemia 10/30/2016   Diabetes type 2, controlled ([place]) 09/12/2015   OSA (obstructive sleep apnea) 02/16/2015   Asthma 10/29/2011   Past Medical History:  Diagnosis Date   Acid reflux    Asthma    Diabetes mellitus without complication (HCC)    Hyperlipidemia    PCOS (polycystic ovarian syndrome)    PONV (postoperative nausea and vomiting)    Sleep apnea    uses cpap    Family History  Problem Relation Age of Onset   Cancer Mother    Hypertension Mother    Diabetes Mother    Breast cancer Mother 11   Cancer Maternal Grandmother    Breast cancer Maternal Grandmother 73   Cancer Father    Diabetes Father    Hypertension Father    Breast cancer Paternal  Aunt 68   Breast cancer Paternal Aunt 22   Breast cancer Paternal Aunt 46    Past Surgical History:  Procedure Laterality Date   BREAST EXCISIONAL BIOPSY Right 06/28/2020   papilloma   DILATION AND EVACUATION N/A 06/05/2017   Procedure: DILATATION AND EVACUATION;  Surgeon: Sanjuana Kava, MD;  Location: [facility];  Service: Gynecology;  Laterality: N/A;   KNEE ARTHROSCOPY WITH MENISCAL REPAIR Left 09/20/2021   Procedure: LEFT KNEE PARTIAL MEDIAL MENISCECTOMY;  Surgeon: Leandrew Koyanagi, MD;  Location: [facility];  Service: Orthopedics;  Laterality: Left;   RADIOACTIVE SEED GUIDED EXCISIONAL BREAST BIOPSY Right 06/28/2020   Procedure: RIGHT RADIOACTIVE SEED GUIDED EXCISIONAL BREAST BIOPSY;  Surgeon:  Rolm Bookbinder, MD;  Location: [facility];  Service: General;  Laterality: Right;   WISDOM TOOTH EXTRACTION     Social History   Occupational History   Not on file  Tobacco Use   Smoking status: Never    Passive exposure: Never   Smokeless tobacco: Never  Vaping Use   Vaping Use: Never used  Substance and Sexual Activity   Alcohol use: Yes    Comment: occas wine   Drug use: No   Sexual activity: Yes    Birth control/protection: None

## 2021-09-28 ENCOUNTER — Encounter (HOSPITAL_BASED_OUTPATIENT_CLINIC_OR_DEPARTMENT_OTHER): Payer: Self-pay | Admitting: Cardiology

## 2021-09-29 ENCOUNTER — Encounter: Payer: Self-pay | Admitting: Orthopaedic Surgery

## 2021-09-29 ENCOUNTER — Other Ambulatory Visit (HOSPITAL_COMMUNITY): Payer: Self-pay

## 2021-09-29 ENCOUNTER — Other Ambulatory Visit: Payer: Self-pay | Admitting: Orthopaedic Surgery

## 2021-09-29 MED ORDER — HYDROCODONE-ACETAMINOPHEN 5-325 MG PO TABS
1.0000 | ORAL_TABLET | Freq: Two times a day (BID) | ORAL | 0 refills | Status: DC | PRN
  Filled 2021-09-29: qty 20, 10d supply, fill #0

## 2021-09-29 NOTE — Telephone Encounter (Signed)
Sent in meds 

## 2021-09-30 ENCOUNTER — Other Ambulatory Visit (HOSPITAL_COMMUNITY): Payer: Self-pay

## 2021-10-04 ENCOUNTER — Other Ambulatory Visit (HOSPITAL_COMMUNITY): Payer: Self-pay

## 2021-10-04 MED FILL — Fenofibrate Tab 48 MG: ORAL | 30 days supply | Qty: 30 | Fill #4 | Status: AC

## 2021-10-05 ENCOUNTER — Encounter: Payer: Self-pay | Admitting: Orthopaedic Surgery

## 2021-10-05 ENCOUNTER — Other Ambulatory Visit: Payer: Self-pay

## 2021-10-05 ENCOUNTER — Other Ambulatory Visit (HOSPITAL_COMMUNITY): Payer: Self-pay

## 2021-10-05 MED ORDER — MOUNJARO 7.5 MG/0.5ML ~~LOC~~ SOAJ
SUBCUTANEOUS | 6 refills | Status: DC
  Filled 2021-10-10: qty 2, 28d supply, fill #0
  Filled 2021-11-01: qty 2, 28d supply, fill #1
  Filled 2021-11-27: qty 2, 28d supply, fill #2

## 2021-10-05 NOTE — Telephone Encounter (Signed)
yes

## 2021-10-06 ENCOUNTER — Other Ambulatory Visit (HOSPITAL_COMMUNITY): Payer: Self-pay

## 2021-10-06 DIAGNOSIS — E1165 Type 2 diabetes mellitus with hyperglycemia: Secondary | ICD-10-CM | POA: Diagnosis not present

## 2021-10-06 DIAGNOSIS — Z6839 Body mass index (BMI) 39.0-39.9, adult: Secondary | ICD-10-CM | POA: Diagnosis not present

## 2021-10-06 DIAGNOSIS — E611 Iron deficiency: Secondary | ICD-10-CM | POA: Diagnosis not present

## 2021-10-06 MED ORDER — ALPRAZOLAM 0.25 MG PO TABS
ORAL_TABLET | ORAL | 0 refills | Status: DC
  Filled 2021-10-06: qty 30, 30d supply, fill #0

## 2021-10-06 MED ORDER — QUETIAPINE FUMARATE ER 300 MG PO TB24
ORAL_TABLET | ORAL | 1 refills | Status: DC
  Filled ????-??-??: fill #0

## 2021-10-07 ENCOUNTER — Other Ambulatory Visit (HOSPITAL_COMMUNITY): Payer: Self-pay

## 2021-10-09 ENCOUNTER — Encounter (HOSPITAL_BASED_OUTPATIENT_CLINIC_OR_DEPARTMENT_OTHER): Payer: Self-pay

## 2021-10-10 ENCOUNTER — Other Ambulatory Visit (HOSPITAL_COMMUNITY): Payer: Self-pay

## 2021-10-11 DIAGNOSIS — E7800 Pure hypercholesterolemia, unspecified: Secondary | ICD-10-CM | POA: Diagnosis not present

## 2021-10-11 DIAGNOSIS — Z6839 Body mass index (BMI) 39.0-39.9, adult: Secondary | ICD-10-CM | POA: Diagnosis not present

## 2021-10-13 ENCOUNTER — Other Ambulatory Visit (HOSPITAL_COMMUNITY): Payer: Self-pay

## 2021-10-18 ENCOUNTER — Other Ambulatory Visit (HOSPITAL_COMMUNITY): Payer: Self-pay

## 2021-10-18 DIAGNOSIS — F419 Anxiety disorder, unspecified: Secondary | ICD-10-CM | POA: Diagnosis not present

## 2021-10-18 DIAGNOSIS — I10 Essential (primary) hypertension: Secondary | ICD-10-CM | POA: Diagnosis not present

## 2021-10-18 DIAGNOSIS — E119 Type 2 diabetes mellitus without complications: Secondary | ICD-10-CM | POA: Diagnosis not present

## 2021-10-18 DIAGNOSIS — Z01419 Encounter for gynecological examination (general) (routine) without abnormal findings: Secondary | ICD-10-CM | POA: Diagnosis not present

## 2021-10-18 DIAGNOSIS — Z803 Family history of malignant neoplasm of breast: Secondary | ICD-10-CM | POA: Diagnosis not present

## 2021-10-20 ENCOUNTER — Other Ambulatory Visit (HOSPITAL_COMMUNITY): Payer: Self-pay

## 2021-10-24 ENCOUNTER — Other Ambulatory Visit: Payer: Self-pay

## 2021-10-24 ENCOUNTER — Ambulatory Visit (INDEPENDENT_AMBULATORY_CARE_PROVIDER_SITE_OTHER): Payer: 59 | Admitting: Orthopaedic Surgery

## 2021-10-24 ENCOUNTER — Encounter: Payer: Self-pay | Admitting: Orthopaedic Surgery

## 2021-10-24 DIAGNOSIS — S838X2A Sprain of other specified parts of left knee, initial encounter: Secondary | ICD-10-CM

## 2021-10-24 NOTE — Progress Notes (Signed)
Post-Op Visit Note   Patient: Julie Hansen           Date of Birth: October 17, 1970           MRN: [id number] Visit Date: 10/24/2021 PCP: Chesley Noon, MD   Assessment & Plan:  Chief Complaint:  Chief Complaint  Patient presents with   Left Knee - Routine Post Op, Follow-up   Visit Diagnoses:  1. Acute medial meniscal injury of knee, left, initial encounter     Plan: Patient is approximately 5 weeks status post partial medial meniscectomy.  Overall she is doing better.  She still limited in mobility and uses crutches and walker occasionally.  She feels some lateral thigh pain.  Examination of the left knee shows fully healed surgical scars.  There is a trace effusion.  Flexion limited to 90 degrees due to guarding and mild pain.  At this point I feel that she needs a couple more weeks to be out of work so that she can get stronger and recover from surgery.  I am fine with her going back to work on February 13 with restrictions and then she can gradually increase activity as tolerated.  I would like to recheck her in 4 weeks.  Follow-Up Instructions: Return in about 4 weeks (around 11/21/2021).   Orders:  No orders of the defined types were placed in this encounter.  No orders of the defined types were placed in this encounter.   Imaging: No results found.  PMFS History: Patient Active Problem List   Diagnosis Date Noted   Acute medial meniscal injury of knee, left, initial encounter 07/18/2021   Chronic migraine without aura without status migrainosus, not intractable 04/14/2021   Muscle spasm 06/20/2018   Unspecified dyspareunia (CODE) 03/20/2017   Amenorrhea 03/20/2017   Irritable bowel syndrome 01/01/2017   Status migrainosus 01/01/2017   Anxiety 01/01/2017   Vaginal discharge 12/17/2016   GERD (gastroesophageal reflux disease) 10/30/2016   Mixed hyperlipidemia 10/30/2016   Diabetes type 2, controlled ([place]) 09/12/2015   OSA (obstructive sleep apnea)  02/16/2015   Asthma 10/29/2011   Past Medical History:  Diagnosis Date   Acid reflux    Asthma    Diabetes mellitus without complication (HCC)    Hyperlipidemia    PCOS (polycystic ovarian syndrome)    PONV (postoperative nausea and vomiting)    Sleep apnea    uses cpap    Family History  Problem Relation Age of Onset   Cancer Mother    Hypertension Mother    Diabetes Mother    Breast cancer Mother 59   Cancer Maternal Grandmother    Breast cancer Maternal Grandmother 68   Cancer Father    Diabetes Father    Hypertension Father    Breast cancer Paternal Aunt 23   Breast cancer Paternal Aunt 80   Breast cancer Paternal Aunt 76    Past Surgical History:  Procedure Laterality Date   BREAST EXCISIONAL BIOPSY Right 06/28/2020   papilloma   DILATION AND EVACUATION N/A 06/05/2017   Procedure: DILATATION AND EVACUATION;  Surgeon: Sanjuana Kava, MD;  Location: [facility];  Service: Gynecology;  Laterality: N/A;   KNEE ARTHROSCOPY WITH MENISCAL REPAIR Left 09/20/2021   Procedure: LEFT KNEE PARTIAL MEDIAL MENISCECTOMY;  Surgeon: Leandrew Koyanagi, MD;  Location: [facility];  Service: Orthopedics;  Laterality: Left;   RADIOACTIVE SEED GUIDED EXCISIONAL BREAST BIOPSY Right 06/28/2020   Procedure: RIGHT RADIOACTIVE SEED GUIDED EXCISIONAL BREAST BIOPSY;  Surgeon: Rolm Bookbinder,  MD;  Location: [facility];  Service: General;  Laterality: Right;   WISDOM TOOTH EXTRACTION     Social History   Occupational History   Not on file  Tobacco Use   Smoking status: Never    Passive exposure: Never   Smokeless tobacco: Never  Vaping Use   Vaping Use: Never used  Substance and Sexual Activity   Alcohol use: Yes    Comment: occas wine   Drug use: No   Sexual activity: Yes    Birth control/protection: None

## 2021-10-25 DIAGNOSIS — E782 Mixed hyperlipidemia: Secondary | ICD-10-CM | POA: Diagnosis not present

## 2021-10-25 DIAGNOSIS — Z794 Long term (current) use of insulin: Secondary | ICD-10-CM | POA: Diagnosis not present

## 2021-10-25 DIAGNOSIS — G43109 Migraine with aura, not intractable, without status migrainosus: Secondary | ICD-10-CM | POA: Diagnosis not present

## 2021-10-25 DIAGNOSIS — G8929 Other chronic pain: Secondary | ICD-10-CM | POA: Diagnosis not present

## 2021-10-25 DIAGNOSIS — M25562 Pain in left knee: Secondary | ICD-10-CM | POA: Diagnosis not present

## 2021-10-25 DIAGNOSIS — E119 Type 2 diabetes mellitus without complications: Secondary | ICD-10-CM | POA: Diagnosis not present

## 2021-10-25 DIAGNOSIS — K219 Gastro-esophageal reflux disease without esophagitis: Secondary | ICD-10-CM | POA: Diagnosis not present

## 2021-10-25 DIAGNOSIS — I10 Essential (primary) hypertension: Secondary | ICD-10-CM | POA: Diagnosis not present

## 2021-10-26 DIAGNOSIS — N951 Menopausal and female climacteric states: Secondary | ICD-10-CM | POA: Diagnosis not present

## 2021-10-30 DIAGNOSIS — N951 Menopausal and female climacteric states: Secondary | ICD-10-CM | POA: Diagnosis not present

## 2021-10-30 DIAGNOSIS — R6882 Decreased libido: Secondary | ICD-10-CM | POA: Diagnosis not present

## 2021-10-30 DIAGNOSIS — G479 Sleep disorder, unspecified: Secondary | ICD-10-CM | POA: Diagnosis not present

## 2021-11-01 ENCOUNTER — Other Ambulatory Visit (HOSPITAL_COMMUNITY): Payer: Self-pay

## 2021-11-01 MED ORDER — FREESTYLE LIBRE 3 SENSOR MISC
6 refills | Status: DC
  Filled 2021-11-01: qty 2, 28d supply, fill #0
  Filled 2021-11-24: qty 2, 28d supply, fill #1
  Filled 2022-01-03: qty 2, 28d supply, fill #2
  Filled 2022-01-19 – 2022-01-20 (×2): qty 2, 28d supply, fill #3
  Filled 2022-02-13: qty 2, 28d supply, fill #4
  Filled 2022-03-06: qty 2, 28d supply, fill #5
  Filled 2022-05-07: qty 2, 28d supply, fill #6

## 2021-11-02 ENCOUNTER — Encounter (HOSPITAL_COMMUNITY): Payer: Self-pay

## 2021-11-03 ENCOUNTER — Ambulatory Visit (INDEPENDENT_AMBULATORY_CARE_PROVIDER_SITE_OTHER): Payer: 59 | Admitting: Physician Assistant

## 2021-11-03 ENCOUNTER — Other Ambulatory Visit (HOSPITAL_COMMUNITY): Payer: Self-pay

## 2021-11-03 ENCOUNTER — Encounter: Payer: Self-pay | Admitting: Orthopaedic Surgery

## 2021-11-03 ENCOUNTER — Other Ambulatory Visit: Payer: Self-pay

## 2021-11-03 DIAGNOSIS — Z9889 Other specified postprocedural states: Secondary | ICD-10-CM

## 2021-11-03 MED ORDER — TRAMADOL HCL 50 MG PO TABS
50.0000 mg | ORAL_TABLET | Freq: Two times a day (BID) | ORAL | 2 refills | Status: DC | PRN
  Filled 2021-11-03: qty 60, 30d supply, fill #0
  Filled 2021-12-19: qty 60, 30d supply, fill #1
  Filled 2022-02-15: qty 60, 30d supply, fill #2

## 2021-11-03 NOTE — Progress Notes (Signed)
Post-Op Visit Note   Patient: Julie Hansen           Date of Birth: 08-09-1971           MRN: [id number] Visit Date: 11/03/2021 PCP: Chesley Noon, MD   Assessment & Plan:  Chief Complaint:  Chief Complaint  Patient presents with   Left Knee - Routine Post Op, Follow-up   Visit Diagnoses:  1. S/P arthroscopy of left knee     Plan: Patient is a pleasant 51 year old female who comes in today 6 weeks status post left knee arthroscopic debridement medial meniscus, date of surgery 09/20/2021.  She has been doing relatively well.  Her pain and range of motion have both improved.  She is still using a crutch at times to help get out of the car and for extra support when she is in the store as she still lacks some strength to the left knee.  Examination of the left knee reveals fully healed surgical portals without complication.  Range of motion 0 to 110 degrees.  She is neurovascular intact distally.  At this point, she will continue with her home exercise program.  We have discussed referral to outpatient physical therapy to help with strengthening exercises.  She will follow-up with [place] as needed.  Follow-Up Instructions: Return if symptoms worsen or fail to improve.   Orders:  No orders of the defined types were placed in this encounter.  Meds ordered this encounter  Medications   traMADol (ULTRAM) 50 MG tablet    Sig: Take 1 tablet (50 mg total) by mouth every 12 (twelve) hours as needed.    Dispense:  60 tablet    Refill:  2    Imaging: No new imaging  PMFS History: Patient Active Problem List   Diagnosis Date Noted   Acute medial meniscal injury of knee, left, initial encounter 07/18/2021   Chronic migraine without aura without status migrainosus, not intractable 04/14/2021   Muscle spasm 06/20/2018   Unspecified dyspareunia (CODE) 03/20/2017   Amenorrhea 03/20/2017   Irritable bowel syndrome 01/01/2017   Status migrainosus 01/01/2017   Anxiety  01/01/2017   Vaginal discharge 12/17/2016   GERD (gastroesophageal reflux disease) 10/30/2016   Mixed hyperlipidemia 10/30/2016   Diabetes type 2, controlled ([place]) 09/12/2015   OSA (obstructive sleep apnea) 02/16/2015   Asthma 10/29/2011   Past Medical History:  Diagnosis Date   Acid reflux    Asthma    Diabetes mellitus without complication (HCC)    Hyperlipidemia    PCOS (polycystic ovarian syndrome)    PONV (postoperative nausea and vomiting)    Sleep apnea    uses cpap    Family History  Problem Relation Age of Onset   Cancer Mother    Hypertension Mother    Diabetes Mother    Breast cancer Mother 46   Cancer Maternal Grandmother    Breast cancer Maternal Grandmother 30   Cancer Father    Diabetes Father    Hypertension Father    Breast cancer Paternal Aunt 4   Breast cancer Paternal Aunt 24   Breast cancer Paternal Aunt 56    Past Surgical History:  Procedure Laterality Date   BREAST EXCISIONAL BIOPSY Right 06/28/2020   papilloma   DILATION AND EVACUATION N/A 06/05/2017   Procedure: DILATATION AND EVACUATION;  Surgeon: Sanjuana Kava, MD;  Location: [facility];  Service: Gynecology;  Laterality: N/A;   KNEE ARTHROSCOPY WITH MENISCAL REPAIR Left 09/20/2021   Procedure: LEFT KNEE PARTIAL MEDIAL  MENISCECTOMY;  Surgeon: Leandrew Koyanagi, MD;  Location: [facility];  Service: Orthopedics;  Laterality: Left;   RADIOACTIVE SEED GUIDED EXCISIONAL BREAST BIOPSY Right 06/28/2020   Procedure: RIGHT RADIOACTIVE SEED GUIDED EXCISIONAL BREAST BIOPSY;  Surgeon: Rolm Bookbinder, MD;  Location: [facility];  Service: General;  Laterality: Right;   WISDOM TOOTH EXTRACTION     Social History   Occupational History   Not on file  Tobacco Use   Smoking status: Never    Passive exposure: Never   Smokeless tobacco: Never  Vaping Use   Vaping Use: Never used  Substance and Sexual Activity   Alcohol use: Yes    Comment: occas wine   Drug use: No   Sexual  activity: Yes    Birth control/protection: None

## 2021-11-16 ENCOUNTER — Other Ambulatory Visit: Payer: Self-pay

## 2021-11-16 ENCOUNTER — Other Ambulatory Visit (HOSPITAL_COMMUNITY): Payer: Self-pay

## 2021-11-17 ENCOUNTER — Other Ambulatory Visit: Payer: Self-pay

## 2021-11-17 ENCOUNTER — Other Ambulatory Visit (HOSPITAL_COMMUNITY): Payer: Self-pay

## 2021-11-17 MED ORDER — FENOFIBRATE 48 MG PO TABS
48.0000 mg | ORAL_TABLET | Freq: Every day | ORAL | 5 refills | Status: DC
  Filled 2021-11-17 – 2021-11-21 (×2): qty 30, 30d supply, fill #0
  Filled 2022-01-01: qty 30, 30d supply, fill #1
  Filled 2022-02-13: qty 30, 30d supply, fill #2
  Filled 2022-03-24 – 2022-04-11 (×2): qty 30, 30d supply, fill #3

## 2021-11-20 ENCOUNTER — Other Ambulatory Visit (HOSPITAL_COMMUNITY): Payer: Self-pay

## 2021-11-21 ENCOUNTER — Other Ambulatory Visit (HOSPITAL_COMMUNITY): Payer: Self-pay

## 2021-11-24 ENCOUNTER — Other Ambulatory Visit (HOSPITAL_COMMUNITY): Payer: Self-pay

## 2021-11-27 ENCOUNTER — Other Ambulatory Visit (HOSPITAL_COMMUNITY): Payer: Self-pay

## 2021-11-27 MED FILL — Insulin Lispro Soln Pen-injector 100 Unit/ML (1 Unit Dial): SUBCUTANEOUS | 20 days supply | Qty: 15 | Fill #5 | Status: AC

## 2021-11-28 ENCOUNTER — Other Ambulatory Visit (HOSPITAL_COMMUNITY): Payer: Self-pay

## 2021-11-28 DIAGNOSIS — G4733 Obstructive sleep apnea (adult) (pediatric): Secondary | ICD-10-CM | POA: Diagnosis not present

## 2021-11-29 ENCOUNTER — Other Ambulatory Visit (HOSPITAL_COMMUNITY): Payer: Self-pay

## 2021-11-29 DIAGNOSIS — F3181 Bipolar II disorder: Secondary | ICD-10-CM | POA: Diagnosis not present

## 2021-11-29 DIAGNOSIS — F411 Generalized anxiety disorder: Secondary | ICD-10-CM | POA: Diagnosis not present

## 2021-11-29 MED ORDER — HYDROXYZINE HCL 10 MG PO TABS
20.0000 mg | ORAL_TABLET | Freq: Every evening | ORAL | 0 refills | Status: DC
  Filled 2021-11-29: qty 180, 90d supply, fill #0

## 2021-11-29 MED ORDER — QUETIAPINE FUMARATE ER 300 MG PO TB24: ORAL_TABLET | ORAL | 1 refills | Status: DC

## 2021-11-29 MED ORDER — ZOLPIDEM TARTRATE ER 12.5 MG PO TBCR: EXTENDED_RELEASE_TABLET | ORAL | 0 refills | Status: DC

## 2021-11-29 MED ORDER — ALPRAZOLAM 0.25 MG PO TABS
ORAL_TABLET | ORAL | 1 refills | Status: DC
  Filled 2021-11-29: qty 30, 30d supply, fill #0

## 2021-11-30 ENCOUNTER — Other Ambulatory Visit: Payer: Self-pay

## 2021-11-30 ENCOUNTER — Other Ambulatory Visit (HOSPITAL_COMMUNITY): Payer: Self-pay

## 2021-11-30 MED ORDER — PANTOPRAZOLE SODIUM 40 MG PO TBEC
DELAYED_RELEASE_TABLET | ORAL | 1 refills | Status: DC
  Filled 2021-11-30: qty 60, 60d supply, fill #0
  Filled 2021-11-30: qty 30, 30d supply, fill #0

## 2021-12-07 ENCOUNTER — Other Ambulatory Visit (HOSPITAL_COMMUNITY): Payer: Self-pay

## 2021-12-12 ENCOUNTER — Encounter: Payer: Self-pay | Admitting: Physician Assistant

## 2021-12-12 ENCOUNTER — Encounter: Payer: Self-pay | Admitting: Orthopaedic Surgery

## 2021-12-12 ENCOUNTER — Ambulatory Visit: Payer: 59 | Admitting: Physician Assistant

## 2021-12-12 ENCOUNTER — Other Ambulatory Visit (HOSPITAL_COMMUNITY): Payer: Self-pay

## 2021-12-12 ENCOUNTER — Other Ambulatory Visit: Payer: Self-pay

## 2021-12-12 VITALS — BP 108/68 | HR 84

## 2021-12-12 DIAGNOSIS — G43709 Chronic migraine without aura, not intractable, without status migrainosus: Secondary | ICD-10-CM | POA: Diagnosis not present

## 2021-12-12 DIAGNOSIS — G4489 Other headache syndrome: Secondary | ICD-10-CM

## 2021-12-12 MED ORDER — GABAPENTIN 600 MG PO TABS
600.0000 mg | ORAL_TABLET | Freq: Three times a day (TID) | ORAL | 1 refills | Status: DC
  Filled 2021-12-12: qty 270, 90d supply, fill #0

## 2021-12-12 MED ORDER — QULIPTA 60 MG PO TABS
60.0000 mg | ORAL_TABLET | Freq: Every day | ORAL | 11 refills | Status: DC
  Filled 2021-12-12 – 2021-12-20 (×2): qty 30, 30d supply, fill #0

## 2021-12-12 NOTE — Progress Notes (Signed)
History:  ?Julie Hansen is a 51 y.o. G2P2002 who presents to clinic today for headache followup.  She feels like she may be having fewer headaches but they are really severe.  She notes they may be more severe just before menses.  She is using Reyvow, '100mg'$ , even though she was prescribed to use '200mg'$ . She wants to be sure she does not run out of the medication.  She is using abortive medication earlier, but she starts slowly.  She notes some headaches last 5 days.  She has been using '400mg'$  of neurontin 3 times daily without issue.   ? ?HIT6: ?Number of days in the last 4 weeks with:  ?Severe headache: 5 ?Moderate headache: 8 ?Mild headache: 5  ?No headache: 10  ? ?Past Medical History:  ?Diagnosis Date  ? Acid reflux   ? Asthma   ? Diabetes mellitus without complication ([place])   ? Hyperlipidemia   ? PCOS (polycystic ovarian syndrome)   ? PONV (postoperative nausea and vomiting)   ? Sleep apnea   ? uses cpap  ? ? ?Social History  ? ?Socioeconomic History  ? Marital status: Married  ?  Spouse name: Not on file  ? Number of children: Not on file  ? Years of education: Not on file  ? Highest education level: Not on file  ?Occupational History  ? Not on file  ?Tobacco Use  ? Smoking status: Never  ?  Passive exposure: Never  ? Smokeless tobacco: Never  ?Vaping Use  ? Vaping Use: Never used  ?Substance and Sexual Activity  ? Alcohol use: Yes  ?  Comment: occas wine  ? Drug use: No  ? Sexual activity: Yes  ?  Birth control/protection: None  ?Other Topics Concern  ? Not on file  ?Social History Narrative  ? Not on file  ? ?Social Determinants of Health  ? ?Financial Resource Strain: Not on file  ?Food Insecurity: Not on file  ?Transportation Needs: Not on file  ?Physical Activity: Not on file  ?Stress: Not on file  ?Social Connections: Not on file  ?Intimate Partner Violence: Not on file  ? ? ?Family History  ?Problem Relation Age of Onset  ? Cancer Mother   ? Hypertension Mother   ? Diabetes Mother   ? Breast  cancer Mother 23  ? Cancer Maternal Grandmother   ? Breast cancer Maternal Grandmother 58  ? Cancer Father   ? Diabetes Father   ? Hypertension Father   ? Breast cancer Paternal Aunt 20  ? Breast cancer Paternal Aunt 20  ? Breast cancer Paternal Aunt 35  ? ? ?Allergies  ?Allergen Reactions  ? Macrodantin [Nitrofurantoin Macrocrystal] Itching  ? Carbamazepine Other (See Comments)  ?  Vaginal discharge  ? ? ?Current Outpatient Medications on File Prior to Visit  ?Medication Sig Dispense Refill  ? Accu-Chek FastClix Lancets MISC Use 4 times a day 400 each 4  ? albuterol (PROVENTIL) (2.5 MG/3ML) 0.083% nebulizer solution Take 2.5 mg by nebulization every 6 (six) hours as needed.    ? atorvastatin (LIPITOR) 40 MG tablet Take one tablet (40 mg dose) by mouth daily. 90 tablet 3  ? baclofen (LIORESAL) 10 MG tablet TAKE 1 TABLET BY MOUTH DAILY AS NEEDED UP TO 3 TIMES DAILY 40 tablet 5  ? beclomethasone (QVAR REDIHALER) 40 MCG/ACT inhaler Inhale 2 puffs into the lungs 2 times daily 26.1 g 2  ? cetirizine (ZYRTEC ALLERGY) 10 MG tablet Take 1 tablet (10 mg total) by mouth  daily. 30 tablet 0  ? chlorproMAZINE (THORAZINE) 25 MG tablet TAKE 1 TABLET BY MOUTH AS NEEDED FOR HEADACHE 30 tablet 2  ? Continuous Blood Gluc Sensor (FREESTYLE LIBRE 3 SENSOR) MISC Use as directed into the skin changing every 14 days 2 each 6  ? Continuous Blood Gluc Sensor (FREESTYLE LIBRE SENSOR SYSTEM) MISC Use as directed every 14 days 6 each 4  ? dapagliflozin propanediol (FARXIGA) 10 MG TABS tablet Take 1 tablet by mouth daily 30 tablet 3  ? fenofibrate (TRICOR) 48 MG tablet TAKE ONE TABLET BY MOUTH DAILY 30 tablet 5  ? fluticasone (FLONASE) 50 MCG/ACT nasal spray USE 1 SPRAY INTO BOTH NOSTRILS DAILY. 16 g 0  ? gabapentin (NEURONTIN) 400 MG capsule Take 1 capsule (400 mg total) by mouth 3 (three) times daily. 270 capsule 1  ? glucose blood test strip Use to check blood sugar 3 times a day 100 each 6  ? hydrOXYzine (ATARAX) 10 MG tablet Take 2 tablets  (20 mg total) by mouth at bedtime. 180 tablet 0  ? ibuprofen (ADVIL,MOTRIN) 800 MG tablet Take 1 tablet (800 mg total) by mouth every 8 (eight) hours as needed. 60 tablet 3  ? insulin degludec (TRESIBA FLEXTOUCH) 200 UNIT/ML FlexTouch Pen Inject into the skin 32 units in the morning and 74 units in the evening 30 mL 6  ? insulin lispro (HUMALOG) 100 UNIT/ML KwikPen INJECT 20 - 25 UNITS SUBCUTANEOUS 3 TIMES A DAY 15 mL 6  ? Lasmiditan Succinate (REYVOW) 100 MG TABS Take 2 tablets (200 mg) by mouth as needed for headache 16 tablet 5  ? linaclotide (LINZESS) 290 MCG CAPS capsule Take 1 capsule by mouth 30 minutes before the first meal 90 capsule 4  ? liothyronine (CYTOMEL) 5 MCG tablet Take 1 tablet by mouth every morning on an empty stomach increase to 2 tablets in 7 days as directed 60 tablet 2  ? lisinopril (ZESTRIL) 5 MG tablet Take one tablet (5 mg dose) by mouth daily. 30 tablet 5  ? meloxicam (MOBIC) 15 MG tablet Take 1 tablet (15 mg total) by mouth daily with a meal 90 tablet 2  ? pantoprazole (PROTONIX) 40 MG tablet Take one tablet (40 mg dose) by mouth daily. 30 tablet 1  ? promethazine (PHENERGAN) 25 MG tablet TAKE 1 TABLET BY MOUTH EVERY 6 HOURS AS NEEDED. 30 tablet 1  ? QUEtiapine (SEROQUEL XR) 300 MG 24 hr tablet Take 2 tablets by mouth every evening 180 tablet 1  ? tirzepatide (MOUNJARO) 7.5 MG/0.5ML Pen Administer 7.'5mg'$  subcutaneously once a week 2 mL 6  ? traMADol (ULTRAM) 50 MG tablet Take 1 tablet (50 mg total) by mouth every 12 (twelve) hours as needed. 60 tablet 2  ? zolpidem (AMBIEN CR) 12.5 MG CR tablet Take 1 by mouth at bedtime 90 tablet 0  ? ALPRAZolam (XANAX) 0.25 MG tablet Take 1 tablet by mouth daily as needed 30 tablet 2  ? fenofibrate (TRICOR) 48 MG tablet TAKE ONE TABLET BY MOUTH DAILY 30 tablet 5  ? ?No current facility-administered medications on file prior to visit.  ? ? ? ?Review of Systems:  ?All pertinent positive/negative included in HPI, all other review of systems are negative ?   ?Objective:  ?Physical Exam ?BP 108/68   Pulse 84  ?CONSTITUTIONAL: Well-developed, well-nourished female in no acute distress.  ?EYES: EOM intact ?ENT: Normocephalic ?CARDIOVASCULAR: Regular rate  ?RESPIRATORY: Normal rate.   ?MUSCULOSKELETAL: Normal ROM ?SKIN: Warm, dry without erythema  ?NEUROLOGICAL: Alert, oriented,  CN II-XII grossly intact, Appropriate balance  ?PSYCH: Normal behavior, mood ? ? ?Assessment & Plan:  ?Assessment: ?1. Chronic migraine without aura without status migrainosus, not intractable   ?Hormone associated headache. ?Pt's chronic migraine persists despite aggressive pharmaceutical therapies.  She has used: neurontin, topamax, emgality, ajovy, effexor and lisinopril for migraine prevention.  She has insufficient efficacy with topamax, emgality, ajovy, lisinopril and so far with neurontin too.  She cannot use beta blockers or tricyclic antidepressants due to other medical conditions.   ?Abortive therapies have included: Ubrelvy, Reyvow, Chlorpromazine, Promethazine, Baclofen, Nurtec, Imitrex, Maxalt, Prednisone, Benadryl, Flexeril and Reglan.   ?She began seeing this provider 5 years ago, on 11/23/16, and I have seen little success. ? ? ?Plan: ?Botox advised as next step.  155units every 3 months.  Will work to obtain approval.   ?Will increase neurontin again to '600mg'$  tid as tolerated, although pt advised it can make weight loss increasingly difficult.  Weight can also be contributing factor with migraine.  ?Will begin Qulipta, '60mg'$  daily for prevention.  I am not optimistic as she has seen little benefit from CGRP targeted medications but we are trying everything.   ?Exercise encouraged - even if it's chair exercise. ?Follow-up in 3 months or sooner PRN ? ?Paticia Stack, PA-C ?12/12/2021 ?8:23 AM ? ?

## 2021-12-12 NOTE — Patient Instructions (Signed)
Migraine Headache A migraine headache is an intense, throbbing pain on one side or both sides of the head. Migraine headaches may also cause other symptoms, such as nausea, vomiting, and sensitivity to light and noise. A migraine headache can last from 4 hours to 3 days. Talk with your doctor about what things may bring on (trigger) your migraine headaches. What are the causes? The exact cause of this condition is not known. However, a migraine may be caused when nerves in the brain become irritated and release chemicals that cause inflammation of blood vessels. This inflammation causes pain. This condition may be triggered or caused by: Drinking alcohol. Smoking. Taking medicines, such as: Medicine used to treat chest pain (nitroglycerin). Birth control pills. Estrogen. Certain blood pressure medicines. Eating or drinking products that contain nitrates, glutamate, aspartame, or tyramine. Aged cheeses, chocolate, or caffeine may also be triggers. Doing physical activity. Other things that may trigger a migraine headache include: Menstruation. Pregnancy. Hunger. Stress. Lack of sleep or too much sleep. Weather changes. Fatigue. What increases the risk? The following factors may make you more likely to experience migraine headaches: Being a certain age. This condition is more common in people who are 25-55 years old. Being female. Having a family history of migraine headaches. Being Caucasian. Having a mental health condition, such as depression or anxiety. Being obese. What are the signs or symptoms? The main symptom of this condition is pulsating or throbbing pain. This pain may: Happen in any area of the head, such as on one side or both sides. Interfere with daily activities. Get worse with physical activity. Get worse with exposure to bright lights or loud noises. Other symptoms may include: Nausea. Vomiting. Dizziness. General sensitivity to bright lights, loud noises, or  smells. Before you get a migraine headache, you may get warning signs (an aura). An aura may include: Seeing flashing lights or having blind spots. Seeing bright spots, halos, or zigzag lines. Having tunnel vision or blurred vision. Having numbness or a tingling feeling. Having trouble talking. Having muscle weakness. Some people have symptoms after a migraine headache (postdromal phase), such as: Feeling tired. Difficulty concentrating. How is this diagnosed? A migraine headache can be diagnosed based on: Your symptoms. A physical exam. Tests, such as: CT scan or an MRI of the head. These imaging tests can help rule out other causes of headaches. Taking fluid from the spine (lumbar puncture) and analyzing it (cerebrospinal fluid analysis, or CSF analysis). How is this treated? This condition may be treated with medicines that: Relieve pain. Relieve nausea. Prevent migraine headaches. Treatment for this condition may also include: Acupuncture. Lifestyle changes like avoiding foods that trigger migraine headaches. Biofeedback. Cognitive behavioral therapy. Follow these instructions at home: Medicines Take over-the-counter and prescription medicines only as told by your health care provider. Ask your health care provider if the medicine prescribed to you: Requires you to avoid driving or using heavy machinery. Can cause constipation. You may need to take these actions to prevent or treat constipation: Drink enough fluid to keep your urine pale yellow. Take over-the-counter or prescription medicines. Eat foods that are high in fiber, such as beans, whole grains, and fresh fruits and vegetables. Limit foods that are high in fat and processed sugars, such as fried or sweet foods. Lifestyle Do not drink alcohol. Do not use any products that contain nicotine or tobacco, such as cigarettes, e-cigarettes, and chewing tobacco. If you need help quitting, ask your health care  provider. Get at least 8   hours of sleep every night. Find ways to manage stress, such as meditation, deep breathing, or yoga. General instructions   Keep a journal to find out what may trigger your migraine headaches. For example, write down: What you eat and drink. How much sleep you get. Any change to your diet or medicines. If you have a migraine headache: Avoid things that make your symptoms worse, such as bright lights. It may help to lie down in a dark, quiet room. Do not drive or use heavy machinery. Ask your health care provider what activities are safe for you while you are experiencing symptoms. Keep all follow-up visits as told by your health care provider. This is important. Contact a health care provider if: You develop symptoms that are different or more severe than your usual migraine headache symptoms. You have more than 15 headache days in one month. Get help right away if: Your migraine headache becomes severe. Your migraine headache lasts longer than 72 hours. You have a fever. You have a stiff neck. You have vision loss. Your muscles feel weak or like you cannot control them. You start to lose your balance often. You have trouble walking. You faint. You have a seizure. Summary A migraine headache is an intense, throbbing pain on one side or both sides of the head. Migraines may also cause other symptoms, such as nausea, vomiting, and sensitivity to light and noise. This condition may be treated with medicines and lifestyle changes. You may also need to avoid certain things that trigger a migraine headache. Keep a journal to find out what may trigger your migraine headaches. Contact your health care provider if you have more than 15 headache days in a month or you develop symptoms that are different or more severe than your usual migraine headache symptoms. This information is not intended to replace advice given to you by your health care provider. Make sure you  discuss any questions you have with your health care provider. Document Revised: 01/02/2019 Document Reviewed: 10/23/2018 Elsevier Patient Education  2022 Elsevier Inc.  

## 2021-12-13 ENCOUNTER — Other Ambulatory Visit: Payer: Self-pay

## 2021-12-13 DIAGNOSIS — Z9889 Other specified postprocedural states: Secondary | ICD-10-CM

## 2021-12-13 NOTE — Telephone Encounter (Signed)
Referral made 

## 2021-12-13 NOTE — Telephone Encounter (Signed)
Yes please order.  Eval and treat s/p knee scope,  knee rehab.

## 2021-12-19 ENCOUNTER — Other Ambulatory Visit (HOSPITAL_COMMUNITY): Payer: Self-pay

## 2021-12-20 ENCOUNTER — Other Ambulatory Visit (HOSPITAL_COMMUNITY): Payer: Self-pay

## 2021-12-20 MED ORDER — MOUNJARO 10 MG/0.5ML ~~LOC~~ SOAJ
SUBCUTANEOUS | 6 refills | Status: DC
  Filled 2021-12-21: qty 2, 28d supply, fill #0
  Filled 2022-01-19: qty 2, 28d supply, fill #1
  Filled 2022-02-21: qty 2, 28d supply, fill #2

## 2021-12-21 ENCOUNTER — Other Ambulatory Visit (HOSPITAL_COMMUNITY): Payer: Self-pay

## 2021-12-21 MED ORDER — MOUNJARO 10 MG/0.5ML ~~LOC~~ SOAJ: SUBCUTANEOUS | 6 refills | Status: DC

## 2021-12-22 ENCOUNTER — Other Ambulatory Visit (HOSPITAL_COMMUNITY): Payer: Self-pay

## 2021-12-23 ENCOUNTER — Other Ambulatory Visit (HOSPITAL_COMMUNITY): Payer: Self-pay

## 2021-12-25 ENCOUNTER — Other Ambulatory Visit (HOSPITAL_COMMUNITY): Payer: Self-pay

## 2021-12-26 ENCOUNTER — Other Ambulatory Visit (HOSPITAL_COMMUNITY): Payer: Self-pay

## 2021-12-26 MED ORDER — OMEPRAZOLE 40 MG PO CPDR
DELAYED_RELEASE_CAPSULE | ORAL | 1 refills | Status: DC
  Filled 2021-12-26: qty 30, 30d supply, fill #0
  Filled 2022-01-26: qty 30, 30d supply, fill #1

## 2021-12-27 ENCOUNTER — Other Ambulatory Visit (HOSPITAL_COMMUNITY): Payer: Self-pay

## 2021-12-28 ENCOUNTER — Encounter: Payer: Self-pay | Admitting: *Deleted

## 2021-12-28 ENCOUNTER — Other Ambulatory Visit (HOSPITAL_COMMUNITY): Payer: Self-pay

## 2021-12-29 ENCOUNTER — Other Ambulatory Visit (HOSPITAL_COMMUNITY): Payer: Self-pay

## 2021-12-30 ENCOUNTER — Other Ambulatory Visit (HOSPITAL_COMMUNITY): Payer: Self-pay

## 2022-01-01 ENCOUNTER — Other Ambulatory Visit (HOSPITAL_COMMUNITY): Payer: Self-pay

## 2022-01-01 ENCOUNTER — Other Ambulatory Visit: Payer: Self-pay | Admitting: *Deleted

## 2022-01-01 ENCOUNTER — Encounter: Payer: Self-pay | Admitting: *Deleted

## 2022-01-01 MED ORDER — BOTOX 200 UNITS IJ SOLR
200.0000 [IU] | INTRAMUSCULAR | 2 refills | Status: DC
  Filled 2022-01-01 – 2022-12-05 (×3): qty 1, 90d supply, fill #0

## 2022-01-02 ENCOUNTER — Other Ambulatory Visit (HOSPITAL_COMMUNITY): Payer: Self-pay

## 2022-01-02 MED ORDER — INSULIN LISPRO (1 UNIT DIAL) 100 UNIT/ML (KWIKPEN)
PEN_INJECTOR | SUBCUTANEOUS | 6 refills | Status: DC
  Filled 2022-01-02: qty 15, 20d supply, fill #0
  Filled 2022-02-15: qty 15, 20d supply, fill #1
  Filled 2022-03-05: qty 15, 20d supply, fill #2
  Filled 2022-07-09: qty 15, 20d supply, fill #3
  Filled 2022-09-08 (×2): qty 15, 20d supply, fill #4
  Filled 2022-09-29: qty 15, 20d supply, fill #5
  Filled 2022-12-05 (×2): qty 15, 20d supply, fill #6

## 2022-01-03 ENCOUNTER — Other Ambulatory Visit (HOSPITAL_COMMUNITY): Payer: Self-pay

## 2022-01-04 ENCOUNTER — Encounter (HOSPITAL_COMMUNITY): Payer: Self-pay | Admitting: Physical Therapy

## 2022-01-04 ENCOUNTER — Encounter: Payer: Self-pay | Admitting: *Deleted

## 2022-01-04 ENCOUNTER — Ambulatory Visit (HOSPITAL_COMMUNITY): Payer: 59 | Attending: Orthopaedic Surgery | Admitting: Physical Therapy

## 2022-01-04 ENCOUNTER — Other Ambulatory Visit (HOSPITAL_COMMUNITY): Payer: Self-pay

## 2022-01-04 ENCOUNTER — Other Ambulatory Visit: Payer: Self-pay

## 2022-01-04 ENCOUNTER — Telehealth: Payer: Self-pay

## 2022-01-04 DIAGNOSIS — N951 Menopausal and female climacteric states: Secondary | ICD-10-CM | POA: Diagnosis not present

## 2022-01-04 DIAGNOSIS — Z9889 Other specified postprocedural states: Secondary | ICD-10-CM | POA: Diagnosis not present

## 2022-01-04 DIAGNOSIS — G8929 Other chronic pain: Secondary | ICD-10-CM | POA: Diagnosis not present

## 2022-01-04 DIAGNOSIS — M6281 Muscle weakness (generalized): Secondary | ICD-10-CM | POA: Diagnosis not present

## 2022-01-04 DIAGNOSIS — E611 Iron deficiency: Secondary | ICD-10-CM | POA: Diagnosis not present

## 2022-01-04 DIAGNOSIS — M25662 Stiffness of left knee, not elsewhere classified: Secondary | ICD-10-CM | POA: Diagnosis not present

## 2022-01-04 DIAGNOSIS — M25562 Pain in left knee: Secondary | ICD-10-CM | POA: Insufficient documentation

## 2022-01-04 DIAGNOSIS — E559 Vitamin D deficiency, unspecified: Secondary | ICD-10-CM | POA: Diagnosis not present

## 2022-01-04 MED ORDER — ALPRAZOLAM 0.25 MG PO TABS
ORAL_TABLET | ORAL | 1 refills | Status: DC
  Filled 2022-06-22: qty 30, 30d supply, fill #0

## 2022-01-04 NOTE — Therapy (Signed)
?OUTPATIENT PHYSICAL THERAPY LOWER EXTREMITY EVALUATION ? ? ?Patient Name: Julie Hansen ?MRN: [id number] ?DOB:07/14/1971, 51 y.o., female ?Today's Date: 01/04/2022 ? ? PT End of Session - 01/04/22 1002   ? ? Visit Number 1   ? Number of Visits 12   ? Date for PT Re-Evaluation 02/15/22   ? Authorization Type UMR   ? PT Start Time 1005   ? PT Stop Time 1045   ? PT Time Calculation (min) 40 min   ? Activity Tolerance Patient tolerated treatment well   ? Behavior During Therapy [facility] for tasks assessed/performed   ? ?  ?  ? ?  ? ? ?Past Medical History:  ?Diagnosis Date  ? Acid reflux   ? Asthma   ? Diabetes mellitus without complication ([place])   ? Hyperlipidemia   ? PCOS (polycystic ovarian syndrome)   ? PONV (postoperative nausea and vomiting)   ? Sleep apnea   ? uses cpap  ? ?Past Surgical History:  ?Procedure Laterality Date  ? BREAST EXCISIONAL BIOPSY Right 06/28/2020  ? papilloma  ? DILATION AND EVACUATION N/A 06/05/2017  ? Procedure: DILATATION AND EVACUATION;  Surgeon: Sanjuana Kava, MD;  Location: [facility];  Service: Gynecology;  Laterality: N/A;  ? KNEE ARTHROSCOPY WITH MENISCAL REPAIR Left 09/20/2021  ? Procedure: LEFT KNEE PARTIAL MEDIAL MENISCECTOMY;  Surgeon: Leandrew Koyanagi, MD;  Location: [place];  Service: Orthopedics;  Laterality: Left;  ? RADIOACTIVE SEED GUIDED EXCISIONAL BREAST BIOPSY Right 06/28/2020  ? Procedure: RIGHT RADIOACTIVE SEED GUIDED EXCISIONAL BREAST BIOPSY;  Surgeon: Rolm Bookbinder, MD;  Location: [place];  Service: General;  Laterality: Right;  ? WISDOM TOOTH EXTRACTION    ? ?Patient Active Problem List  ? Diagnosis Date Noted  ? Acute medial meniscal injury of knee, left, initial encounter 07/18/2021  ? Chronic migraine without aura without status migrainosus, not intractable 04/14/2021  ? Muscle spasm 06/20/2018  ? Unspecified dyspareunia (CODE) 03/20/2017  ? Amenorrhea 03/20/2017  ? Irritable bowel syndrome 01/01/2017  ? Status migrainosus  01/01/2017  ? Anxiety 01/01/2017  ? Vaginal discharge 12/17/2016  ? GERD (gastroesophageal reflux disease) 10/30/2016  ? Mixed hyperlipidemia 10/30/2016  ? Diabetes type 2, controlled ([place]) 09/12/2015  ? OSA (obstructive sleep apnea) 02/16/2015  ? Asthma 10/29/2011  ? ? ?PCP: Chesley Noon, MD ? ?REFERRING PROVIDER: Leandrew Koyanagi, MD ? ?REFERRING DIAG: s/p  left Arthroscopic surgery ? ?THERAPY DIAG:  ?Difficulty in walking, decreased muscular strength, stiffness of left knee ? ?ONSET DATE: 09/20/2022 ? ?SUBJECTIVE:  ? ?SUBJECTIVE STATEMENT: ?Ms. Julie Hansen states that she had pain since June 2022.  Denies any injuries.   She had cortisone injection in early September with complete relief for 2 weeks but symptoms returned therefore she opted to have arthroscopic surgery on 09/20/2022.  She continues to have difficulty with her strength therefore she has been referred to skilled PT  ?She has returned to work full time walking with her crutch because she is uneven on her feet.  She states that her left knee buckles approximately 5 times a day.  ?PERTINENT HISTORY: ?Mild OA ? ?PAIN:  ?Are you having pain? Yes: NPRS scale: 8/10 ?Pain location: Left knee  ?Pain description: stiffness  ?Aggravating factors: walking without her crutch  ?Relieving factors: rest  ? ?PRECAUTIONS: None ? ?WEIGHT BEARING RESTRICTIONS No ? ?FALLS:  ?Has patient fallen in last 6 months? No ? ?LIVING ENVIRONMENT: ?Lives with: lives with their family ?Lives in: House/apartment ?Stairs: Yes: External: 3 steps;  none ?Has following equipment at home: Crutches ?Taking steps one at a time. ?OCCUPATION: works full time  ? ?PLOF: Independent ? ?PATIENT GOALS no pain, full use of knee  ? ? ? ? ?DIAGNOSTIC FINDINGS: per MRI on 10/11 prior to surgery  ?1. Mild-moderate medial compartment osteoarthritis. ?2. Degenerative undersurface tearing of the medial meniscus centered ?at the posterior horn-body junction. ?3. Mild popliteus muscle strain. ?4.  Small-moderate knee joint effusion. ? OBJECTIVE:  ? ?PATIENT SURVEYS:  ?FOTO 3 ? ?COGNITION: ? Overall cognitive status: Within functional limits for tasks assessed   ?  ?POSTURE:  ?wnl ? ? ?LE ROM: ? ?Active ROM Right ?01/04/2022 Left ?01/04/2022  ?Hip flexion    ?Hip extension    ?Hip abduction    ?Hip adduction    ?Hip internal rotation    ?Hip external rotation    ?Knee flexion 128 100  ?Knee extension 0 0  ?Ankle dorsiflexion    ?Ankle plantarflexion    ?Ankle inversion    ?Ankle eversion    ? (Blank rows = not tested) ? ?LE MMT: ? ?MMT Right ?01/04/2022 Left ?01/04/2022  ?Hip flexion  2/5  ?Hip extension  3/5  ?Hip abduction  5/5  ?Hip adduction    ?Hip internal rotation    ?Hip external rotation    ?Knee flexion  3-/5  ?Knee extension  3/5  ?Ankle dorsiflexion    ?Ankle plantarflexion    ?Ankle inversion    ?Ankle eversion    ? (Blank rows = not tested) ? ?FUNCTIONAL TESTS:  ?30 seconds chair stand test:  4 in 30 seconds  ?                      Single leg stance:  3 seconds  B ?GAIT: ?Distance walked: 336 ?Assistive device utilized: Crutches  1  ?Level of assistance: Modified independence ?Comments: 2 minute walk test  ? ? ? ?TODAY'S TREATMENT: ?Sitting: LAQ x 10 ?Supine: Q-set, SLR, heelslide all x 10 ?Prone:  hamstring curl x 5  ? ? ?PATIENT EDUCATION:  ?Education details: HEP ?Person educated: Patient ?Education method: Explanation and Handouts ?Education comprehension: returned demonstration ? ? ?HOME EXERCISE PROGRAM: Evaluation:  ?Sitting: LAQ x 10 ?Supine: Q-set, SLR, heelslide all x 10 ?Prone:  hamstring curl x 5  ?Do above exercises 3 x a day  ? ?ASSESSMENT: ? ?CLINICAL IMPRESSION: ?Patient is a 51 y.o. female who was seen today for physical therapy evaluation and treatment for her left knee following arthroscopic surgery in December.  Evaluation demonstrates decreased ROM, decreased strength, decreased balance and increased pain limiting activity tolerance.  Ms. Julie Hansen will benefit from skilled PT to  address these issues and return her to her prior level of activity.   ? ? ?OBJECTIVE IMPAIRMENTS Abnormal gait, decreased activity tolerance, decreased balance, decreased ROM, and pain.  ? ?ACTIVITY LIMITATIONS occupation, yard work, shopping, and yard work.  ? ?PERSONAL FACTORS Time since onset of injury/illness/exacerbation are also affecting patient's functional outcome.  ? ? ?REHAB POTENTIAL: Good ? ?CLINICAL DECISION MAKING: Stable/uncomplicated ? ?EVALUATION COMPLEXITY: Low ? ? ?GOALS: ?Goals reviewed with patient? No ? ?SHORT TERM GOALS: Target date: 01/25/2022 ? ?PT to be I in HEP to improve strength to be able to walk without crutch ?Baseline: ?Goal status: INITIAL ? ?2.  PT pain to be no greater than a 5/10 to allow pt to be able to walk consistently for 20 minutes to be able to complete a short shopping trip.  ?Baseline:  ?  Goal status: INITIAL ? ?3.  PT ROM of Left leg to be 0-115 to allow pt to squat  ?Baseline:  ?Goal status: INITIAL ? ?LONG TERM GOALS: Target date: 02/15/2022 ? ?PT to be completing an advanced HEP to allow strength in Lt LE to be at least a 4+/5 to be able to go up and down steps in a reciprocal manner  ?Baseline:  ?Goal status: INITIAL ? ?2.  Pt to be able to balance on both LE for at least 15 seconds to decrease risk of reinjury ?Baseline:  ?Goal status: INITIAL ? ?3.  PT pain to be no greater than a 2/10 to allow pt to be able to walk for 40 minutes to be able to go to shopping without hesitation.  ?Baseline:  ?Goal status: INITIAL ? ? ?PLAN: ?PT FREQUENCY: 2x/week ? ?PT DURATION: 6 weeks ? ?PLANNED INTERVENTIONS: Therapeutic exercises, Therapeutic activity, Neuromuscular re-education, Balance training, Gait training, Patient/Family education, Cryotherapy, and Manual therapy ? ?PLAN FOR NEXT SESSION: bridge, sit to stand, heel raise, standing knee flexion, single leg stance.  Progress strength, ROM and balance as able.  ? ?Rayetta Humphrey, PT CLT ?[phone]  ?01/04/2022, 10:50AM  ?

## 2022-01-04 NOTE — Telephone Encounter (Signed)
Called pt to schedule Botox follow up with Santiago Glad in June. Left vm for pt to call office back.  ?

## 2022-01-05 ENCOUNTER — Other Ambulatory Visit: Payer: Self-pay

## 2022-01-05 ENCOUNTER — Other Ambulatory Visit (HOSPITAL_COMMUNITY): Payer: Self-pay

## 2022-01-05 MED ORDER — ZOLPIDEM TARTRATE ER 12.5 MG PO TBCR
EXTENDED_RELEASE_TABLET | ORAL | 0 refills | Status: DC
  Filled 2022-01-05 – 2022-06-29 (×2): qty 90, 90d supply, fill #0

## 2022-01-05 MED ORDER — ALPRAZOLAM 0.25 MG PO TABS
ORAL_TABLET | ORAL | 1 refills | Status: DC
  Filled 2022-01-05: qty 30, 30d supply, fill #0
  Filled 2022-02-02: qty 30, 30d supply, fill #1

## 2022-01-05 MED ORDER — ZOLPIDEM TARTRATE ER 12.5 MG PO TBCR
12.5000 mg | EXTENDED_RELEASE_TABLET | Freq: Every evening | ORAL | 0 refills | Status: DC
  Filled 2022-01-05: qty 90, 90d supply, fill #0

## 2022-01-08 ENCOUNTER — Telehealth (HOSPITAL_COMMUNITY): Payer: Self-pay | Admitting: Physical Therapy

## 2022-01-08 ENCOUNTER — Other Ambulatory Visit (HOSPITAL_COMMUNITY): Payer: Self-pay

## 2022-01-09 ENCOUNTER — Encounter (HOSPITAL_COMMUNITY): Payer: 59 | Admitting: Physical Therapy

## 2022-01-09 ENCOUNTER — Other Ambulatory Visit (HOSPITAL_COMMUNITY): Payer: Self-pay

## 2022-01-11 ENCOUNTER — Encounter (HOSPITAL_COMMUNITY): Payer: 59

## 2022-01-15 ENCOUNTER — Telehealth (HOSPITAL_COMMUNITY): Payer: Self-pay | Admitting: Physical Therapy

## 2022-01-15 ENCOUNTER — Encounter (HOSPITAL_COMMUNITY): Payer: 59 | Admitting: Physical Therapy

## 2022-01-15 NOTE — Telephone Encounter (Signed)
Patient missed appointment this morning stating conflicts with schedule provided. She will call back front office with her work schedule and will reschedule appointments. ? ?9:02 AM, 01/15/22 ?Mearl Latin PT, DPT ?Physical Therapist at [facility] ?[facility] ? ?

## 2022-01-17 ENCOUNTER — Encounter (HOSPITAL_COMMUNITY): Payer: Self-pay | Admitting: Physical Therapy

## 2022-01-17 ENCOUNTER — Ambulatory Visit (HOSPITAL_COMMUNITY): Payer: 59 | Admitting: Physical Therapy

## 2022-01-17 DIAGNOSIS — G8929 Other chronic pain: Secondary | ICD-10-CM | POA: Diagnosis not present

## 2022-01-17 DIAGNOSIS — M6281 Muscle weakness (generalized): Secondary | ICD-10-CM

## 2022-01-17 DIAGNOSIS — M25662 Stiffness of left knee, not elsewhere classified: Secondary | ICD-10-CM | POA: Diagnosis not present

## 2022-01-17 DIAGNOSIS — Z9889 Other specified postprocedural states: Secondary | ICD-10-CM | POA: Diagnosis not present

## 2022-01-17 DIAGNOSIS — M25562 Pain in left knee: Secondary | ICD-10-CM | POA: Diagnosis not present

## 2022-01-17 NOTE — Therapy (Signed)
?OUTPATIENT PHYSICAL THERAPY TREATMENT NOTE ? ? ?Patient Name: Julie Hansen ?MRN: [id number] ?DOB:September 28, 1970, 51 y.o., female ?Today's Date: 01/17/2022 ? ?PCP: Chesley Noon, MD ?REFERRING PROVIDER: Chesley Noon, MD ? ?END OF SESSION:  ? PT End of Session - 01/17/22 0820   ? ? Visit Number 2   ? Number of Visits 12   ? Date for PT Re-Evaluation 02/15/22   ? Authorization Type UMR   ? PT Start Time 0825   ? PT Stop Time 0910   ? PT Time Calculation (min) 45 min   ? Activity Tolerance Patient tolerated treatment well   ? Behavior During Therapy [facility] for tasks assessed/performed   ? ?  ?  ? ?  ? ? ?Past Medical History:  ?Diagnosis Date  ? Acid reflux   ? Asthma   ? Diabetes mellitus without complication ([place])   ? Hyperlipidemia   ? PCOS (polycystic ovarian syndrome)   ? PONV (postoperative nausea and vomiting)   ? Sleep apnea   ? uses cpap  ? ?Past Surgical History:  ?Procedure Laterality Date  ? BREAST EXCISIONAL BIOPSY Right 06/28/2020  ? papilloma  ? DILATION AND EVACUATION N/A 06/05/2017  ? Procedure: DILATATION AND EVACUATION;  Surgeon: Sanjuana Kava, MD;  Location: [place] [facility];  Service: Gynecology;  Laterality: N/A;  ? KNEE ARTHROSCOPY WITH MENISCAL REPAIR Left 09/20/2021  ? Procedure: LEFT KNEE PARTIAL MEDIAL MENISCECTOMY;  Surgeon: Leandrew Koyanagi, MD;  Location: [place];  Service: Orthopedics;  Laterality: Left;  ? RADIOACTIVE SEED GUIDED EXCISIONAL BREAST BIOPSY Right 06/28/2020  ? Procedure: RIGHT RADIOACTIVE SEED GUIDED EXCISIONAL BREAST BIOPSY;  Surgeon: Rolm Bookbinder, MD;  Location: [place];  Service: General;  Laterality: Right;  ? WISDOM TOOTH EXTRACTION    ? ?Patient Active Problem List  ? Diagnosis Date Noted  ? Acute medial meniscal injury of knee, left, initial encounter 07/18/2021  ? Chronic migraine without aura without status migrainosus, not intractable 04/14/2021  ? Muscle spasm 06/20/2018  ? Unspecified dyspareunia (CODE) 03/20/2017  ?  Amenorrhea 03/20/2017  ? Irritable bowel syndrome 01/01/2017  ? Status migrainosus 01/01/2017  ? Anxiety 01/01/2017  ? Vaginal discharge 12/17/2016  ? GERD (gastroesophageal reflux disease) 10/30/2016  ? Mixed hyperlipidemia 10/30/2016  ? Diabetes type 2, controlled ([place]) 09/12/2015  ? OSA (obstructive sleep apnea) 02/16/2015  ? Asthma 10/29/2011  ?REFERRING PROVIDER: Leandrew Koyanagi, MD ?  ? ?REFERRING DIAG: s/p  left Arthroscopic surgery ?  ? ?THERAPY DIAG:  ?Stiffness of left knee, not elsewhere classified ? ?Muscle weakness (generalized) ? ?Chronic pain of left knee ? ?PERTINENT HISTORY: mild OA ? ?PRECAUTIONS: none  ? ?SUBJECTIVE: PT states that she has been doing her exercises  ? ?PAIN:  ?Are you having pain? Yes: NPRS scale: 2/10 ?Pain location: knee  ?Pain description: pressure  ?Aggravating factors: sit to stand  ?Relieving factors: pain is just when she gets up  ? ? ?OBJECTIVE: (objective measures completed at initial evaluation unless otherwise dated) ?  ?LE ROM: ?  ?Active ROM Right ?01/04/2022 Left ?01/04/2022  ?Hip flexion      ?Hip extension      ?Hip abduction      ?Hip adduction      ?Hip internal rotation      ?Hip external rotation      ?Knee flexion 128 100  ?Knee extension 0 0  ?Ankle dorsiflexion      ?Ankle plantarflexion      ?Ankle inversion      ?  Ankle eversion      ? (Blank rows = not tested) ?  ?LE MMT: ?  ?MMT Right ?01/04/2022 Left ?01/04/2022  ?Hip flexion   2/5  ?Hip extension   3/5  ?Hip abduction   5/5  ?Hip adduction      ?Hip internal rotation      ?Hip external rotation      ?Knee flexion   3-/5  ?Knee extension   3/5  ?Ankle dorsiflexion      ?Ankle plantarflexion      ?Ankle inversion      ?Ankle eversion      ? (Blank rows = not tested) ?  ?FUNCTIONAL TESTS:  ?30 seconds chair stand test:  4 in 30 seconds  ?                      Single leg stance:  3 seconds  B ?GAIT: ?Distance walked: 336 ?Assistive device utilized: Crutches  1  ?Level of assistance: Modified  independence ?Comments: 2 minute walk test  ?  ?  ?  ?TODAY'S TREATMENT: ?01/17/2022 ?                         Standing:  heel raises x 10 ?                                          Knee flextion x 10  ?                                          Functional squat x 10  ?                                          Terminal knee extension: x 10  ?                                          Knee driver x 5 for 15"  ?                                          Step up 4" x 10 ?                                          Single leg stance x 5 B  ?                                          Rocker board 2 minutes  ?                         Sitting:      LAQ x 10 3#         ?  Sit to stand x 10  ?                                          Leg press 3 Pl x 10 ; 4 Pl x 10  ?                        Gait :         no assistive device x 552' ( 2 laps) ?                                          ?01/04/2022 ? ?Sitting: LAQ x 10 ?Supine: Q-set, SLR, heelslide all x 10 ?Prone:  hamstring curl x 5  ?  ?  ?PATIENT EDUCATION:  ?Education details: HEP ?Person educated: Patient ?Education method: Explanation and Handouts ?Education comprehension: returned demonstration ?  ?  ?HOME EXERCISE PROGRAM:  ?01/17/22:   [id number](heelraises, squat ,standing knee flextion, single leg stance and Sit to stand. ) ?Evaluation:  ?Sitting: LAQ x 10 ?Supine: Q-set, SLR, heelslide all x 10 ?Prone:  hamstring curl x 5  ?Do above exercises 3 x a day  ?  ?ASSESSMENT: ?  ?CLINICAL IMPRESSION:  Worked on proper gait mechanics with ambulation.  Added standing functional strengthening activity to  ?Program.  Therapist urged pt to stop using the crutch as soon as possible.  Pt continues to have decreased strength and activity tolerance and ?Will continue to benefit from skilled PT>  ?  ?OBJECTIVE IMPAIRMENTS Abnormal gait, decreased activity tolerance, decreased balance, decreased ROM, and pain.  ?  ?ACTIVITY LIMITATIONS occupation, yard  work, shopping, and yard work.  ?  ?PERSONAL FACTORS Time since onset of injury/illness/exacerbation are also affecting patient's functional outcome.  ?  ?  ?REHAB POTENTIAL: Good ?  ?CLINICAL DECISION MAKING: Stable/uncomplicated ?  ?EVALUATION COMPLEXITY: Low ?  ?  ?GOALS: ?Goals reviewed with patient? No ?  ?SHORT TERM GOALS: Target date: 01/25/2022 ?  ?PT to be I in HEP to improve strength to be able to walk without crutch ?Baseline: ?Goal status: INITIAL ?  ?2.  PT pain to be no greater than a 5/10 to allow pt to be able to walk consistently for 20 minutes to be able to complete a short shopping trip.  ?Baseline:  ?Goal status: INITIAL ?  ?3.  PT ROM of Left leg to be 0-115 to allow pt to squat  ?Baseline:  ?Goal status: INITIAL ?  ?LONG TERM GOALS: Target date: 02/15/2022 ?  ?PT to be completing an advanced HEP to allow strength in Lt LE to be at least a 4+/5 to be able to go up and down steps in a reciprocal manner  ?Baseline:  ?Goal status: INITIAL ?  ?2.  Pt to be able to balance on both LE for at least 15 seconds to decrease risk of reinjury ?Baseline:  ?Goal status: INITIAL ?  ?3.  PT pain to be no greater than a 2/10 to allow pt to be able to walk for 40 minutes to be able to go to shopping without hesitation.  ?Baseline:  ?Goal status: INITIAL ?  ?  ?PLAN: ?PT FREQUENCY: 2x/week ?  ?PT DURATION: 6 weeks ?  ?PLANNED INTERVENTIONS: Therapeutic exercises, Therapeutic activity, Neuromuscular re-education, Balance  training, Gait training, Patient/Family education, Cryotherapy, and Manual therapy ?  ?PLAN FOR NEXT SESSION: bridge, wall squat   ? ?[phone]  ?01/17/2022, 8:24 AM ? ?   ?

## 2022-01-19 ENCOUNTER — Other Ambulatory Visit (HOSPITAL_COMMUNITY): Payer: Self-pay

## 2022-01-20 ENCOUNTER — Other Ambulatory Visit (HOSPITAL_COMMUNITY): Payer: Self-pay

## 2022-01-22 ENCOUNTER — Other Ambulatory Visit (HOSPITAL_COMMUNITY): Payer: Self-pay

## 2022-01-23 ENCOUNTER — Encounter (HOSPITAL_COMMUNITY): Payer: Self-pay | Admitting: Physical Therapy

## 2022-01-23 ENCOUNTER — Ambulatory Visit (HOSPITAL_COMMUNITY): Payer: 59 | Attending: Orthopaedic Surgery | Admitting: Physical Therapy

## 2022-01-23 DIAGNOSIS — M25562 Pain in left knee: Secondary | ICD-10-CM | POA: Insufficient documentation

## 2022-01-23 DIAGNOSIS — G8929 Other chronic pain: Secondary | ICD-10-CM | POA: Insufficient documentation

## 2022-01-23 DIAGNOSIS — M6281 Muscle weakness (generalized): Secondary | ICD-10-CM | POA: Diagnosis not present

## 2022-01-23 DIAGNOSIS — M25662 Stiffness of left knee, not elsewhere classified: Secondary | ICD-10-CM | POA: Diagnosis not present

## 2022-01-23 NOTE — Therapy (Signed)
?OUTPATIENT PHYSICAL THERAPY TREATMENT NOTE ? ? ?Patient Name: Julie Hansen ?MRN: [id number] ?DOB:[birth year], 51 y.o., female ?Today's Date: 01/23/2022 ? ?PCP: Chesley Noon, MD ?REFERRING PROVIDER: Leandrew Koyanagi, MD ? ?END OF SESSION:  ? PT End of Session - 01/23/22 0917   ? ? Visit Number 3   ? Number of Visits 12   ? Date for PT Re-Evaluation 02/15/22   ? Authorization Type UMR   ? PT Start Time [phone]   ? PT Stop Time [phone]   ? PT Time Calculation (min) 38 min   ? Activity Tolerance Patient tolerated treatment well   ? Behavior During Therapy [facility] for tasks assessed/performed   ? ?  ?  ? ?  ? ? ?Past Medical History:  ?Diagnosis Date  ? Acid reflux   ? Asthma   ? Diabetes mellitus without complication ([place])   ? Hyperlipidemia   ? PCOS (polycystic ovarian syndrome)   ? PONV (postoperative nausea and vomiting)   ? Sleep apnea   ? uses cpap  ? ?Past Surgical History:  ?Procedure Laterality Date  ? BREAST EXCISIONAL BIOPSY Right 06/28/2020  ? papilloma  ? DILATION AND EVACUATION N/A 06/05/2017  ? Procedure: DILATATION AND EVACUATION;  Surgeon: Sanjuana Kava, MD;  Location: [place] [facility];  Service: Gynecology;  Laterality: N/A;  ? KNEE ARTHROSCOPY WITH MENISCAL REPAIR Left 09/20/2021  ? Procedure: LEFT KNEE PARTIAL MEDIAL MENISCECTOMY;  Surgeon: Leandrew Koyanagi, MD;  Location: [place];  Service: Orthopedics;  Laterality: Left;  ? RADIOACTIVE SEED GUIDED EXCISIONAL BREAST BIOPSY Right 06/28/2020  ? Procedure: RIGHT RADIOACTIVE SEED GUIDED EXCISIONAL BREAST BIOPSY;  Surgeon: Rolm Bookbinder, MD;  Location: [place];  Service: General;  Laterality: Right;  ? WISDOM TOOTH EXTRACTION    ? ?Patient Active Problem List  ? Diagnosis Date Noted  ? Acute medial meniscal injury of knee, left, initial encounter 07/18/2021  ? Chronic migraine without aura without status migrainosus, not intractable 04/14/2021  ? Muscle spasm 06/20/2018  ? Unspecified dyspareunia (CODE) 03/20/2017  ? Amenorrhea  03/20/2017  ? Irritable bowel syndrome 01/01/2017  ? Status migrainosus 01/01/2017  ? Anxiety 01/01/2017  ? Vaginal discharge 12/17/2016  ? GERD (gastroesophageal reflux disease) 10/30/2016  ? Mixed hyperlipidemia 10/30/2016  ? Diabetes type 2, controlled ([place]) 09/12/2015  ? OSA (obstructive sleep apnea) 02/16/2015  ? Asthma 10/29/2011  ?REFERRING PROVIDER: Leandrew Koyanagi, MD ?  ? ?REFERRING DIAG: s/p  left Arthroscopic surgery ?  ? ?THERAPY DIAG:  ?Stiffness of left knee, not elsewhere classified ? ?Muscle weakness (generalized) ? ?Chronic pain of left knee ? ?PERTINENT HISTORY: mild OA ? ?PRECAUTIONS: none  ? ?SUBJECTIVE: Hasn't been using crutch. Leg hurts with bed mobility. Knee gets stiff.  ? ?PAIN:  ?Are you having pain? Yes: NPRS scale: 3/10 ?Pain location: knee  ?Pain description: stiff, sharp  ?Aggravating factors: sit to stand  ?Relieving factors: pain is just when she gets up  ? ? ?OBJECTIVE: (objective measures completed at initial evaluation unless otherwise dated) ?  ?LE ROM: ?  ?Active ROM Right ?01/04/2022 Left ?01/04/2022  ?Hip flexion      ?Hip extension      ?Hip abduction      ?Hip adduction      ?Hip internal rotation      ?Hip external rotation      ?Knee flexion 128 100  ?Knee extension 0 0  ?Ankle dorsiflexion      ?Ankle plantarflexion      ?Ankle  inversion      ?Ankle eversion      ? (Blank rows = not tested) ?  ?LE MMT: ?  ?MMT Right ?01/04/2022 Left ?01/04/2022  ?Hip flexion   2/5  ?Hip extension   3/5  ?Hip abduction   5/5  ?Hip adduction      ?Hip internal rotation      ?Hip external rotation      ?Knee flexion   3-/5  ?Knee extension   3/5  ?Ankle dorsiflexion      ?Ankle plantarflexion      ?Ankle inversion      ?Ankle eversion      ? (Blank rows = not tested) ?  ?FUNCTIONAL TESTS:  ?30 seconds chair stand test:  4 in 30 seconds  ?                      Single leg stance:  3 seconds  B ?GAIT: ?Distance walked: 336 ?Assistive device utilized: Crutches  1  ?Level of assistance: Modified  independence ?Comments: 2 minute walk test  ?  ?  ?  ?TODAY'S TREATMENT: ?01/23/22 ?Bike 5 minutes level 2 ?HR 1x 20 ?Step up 4 inch step 2x 15 LLE ?Rockerboard 3x 30 seconds lateral  ?Wall sit 10x 3 second holds ?STS 1x 10  ?Leg press 4 plates 2x 10  ?SLS with vectors 5x 5 second holds bilateral with unilateral HHA ?Lateral stepping 4x 10 feet ? ?01/17/2022 ?                         Standing:  heel raises x 10 ?                                          Knee flextion x 10  ?                                          Functional squat x 10  ?                                          Terminal knee extension: x 10  ?                                          Knee driver x 5 for 15"  ?                                          Step up 4" x 10 ?                                          Single leg stance x 5 B  ?  Rocker board 2 minutes  ?                         Sitting:      LAQ x 10 3#         ?                                          Sit to stand x 10  ?                                          Leg press 3 Pl x 10 ; 4 Pl x 10  ?                        Gait :         no assistive device x 552' ( 2 laps) ?                                          ?01/04/2022 ? ?Sitting: LAQ x 10 ?Supine: Q-set, SLR, heelslide all x 10 ?Prone:  hamstring curl x 5  ?  ?  ?PATIENT EDUCATION:  ?Education details: HEP, importance of quad strength ?Person educated: Patient ?Education method: Explanation and Handouts ?Education comprehension: returned demonstration ?  ?  ?HOME EXERCISE PROGRAM:  ?01/17/22:   [id number](heelraises, squat ,standing knee flextion, single leg stance and Sit to stand. ) ?Evaluation:  ?Sitting: LAQ x 10 ?Supine: Q-set, SLR, heelslide all x 10 ?Prone:  hamstring curl x 5  ?Do above exercises 3 x a day  ?  ?ASSESSMENT: ?  ?CLINICAL IMPRESSION:  Began session on bike for dynamic warm up while discussing patient's symptoms and importance of quad strength. She requires frequent cueing for L  quad motor control and knee flexion with eccentric phase of step exercise. Patient utilizes HHA throughout session for strength and balance deficits. Weight shift off LLE with increasing knee flexion strength exercises. Patient will continue to benefit from physical therapy in order to improve function and reduce impairment. ? ?  ?OBJECTIVE IMPAIRMENTS Abnormal gait, decreased activity tolerance, decreased balance, decreased ROM, and pain.  ?  ?ACTIVITY LIMITATIONS occupation, yard work, shopping, and yard work.  ?  ?PERSONAL FACTORS Time since onset of injury/illness/exacerbation are also affecting patient's functional outcome.  ?  ?  ?REHAB POTENTIAL: Good ?  ?CLINICAL DECISION MAKING: Stable/uncomplicated ?  ?EVALUATION COMPLEXITY: Low ?  ?  ?GOALS: ?Goals reviewed with patient? No ?  ?SHORT TERM GOALS: Target date: 01/25/2022 ?  ?PT to be I in HEP to improve strength to be able to walk without crutch ?Baseline: ?Goal status: on-going ?  ?2.  PT pain to be no greater than a 5/10 to allow pt to be able to walk consistently for 20 minutes to be able to complete a short shopping trip.  ?Baseline:  ?Goal status: on-going ?  ?3.  PT ROM of Left leg to be 0-115 to allow pt to squat  ?Baseline:  ?Goal status: on-going ?  ?LONG TERM GOALS: Target date: 02/15/2022 ?  ?PT to be completing an advanced HEP to allow strength in Lt  LE to be at least a 4+/5 to be able to go up and down steps in a reciprocal manner  ?Baseline:  ?Goal status: on-going ?  ?2.  Pt to be able to balance on both LE for at least 15 seconds to decrease risk of reinjury ?Baseline:  ?Goal status: on-going ?  ?3.  PT pain to be no greater than a 2/10 to allow pt to be able to walk for 40 minutes to be able to go to shopping without hesitation.  ?Baseline:  ?Goal status: on-going ?  ?  ?PLAN: ?PT FREQUENCY: 2x/week ?  ?PT DURATION: 6 weeks ?  ?PLANNED INTERVENTIONS: Therapeutic exercises, Therapeutic activity, Neuromuscular re-education, Balance training,  Gait training, Patient/Family education, Cryotherapy, and Manual therapy ?  ?PLAN FOR NEXT SESSION: quad strength and L knee mobility, progress as able ? ?9:18 AM, 01/23/22 ?Mearl Latin PT, DPT ?Physical

## 2022-01-25 ENCOUNTER — Encounter (HOSPITAL_COMMUNITY): Payer: 59

## 2022-01-26 ENCOUNTER — Other Ambulatory Visit (HOSPITAL_COMMUNITY): Payer: Self-pay

## 2022-01-30 NOTE — Telephone Encounter (Signed)
Pt phoned stated she would not be able to make the appt. Canceled per her request, stated will call back to reschedule  ?

## 2022-02-02 ENCOUNTER — Encounter: Payer: Self-pay | Admitting: Orthopaedic Surgery

## 2022-02-02 ENCOUNTER — Other Ambulatory Visit: Payer: Self-pay | Admitting: Physician Assistant

## 2022-02-02 ENCOUNTER — Ambulatory Visit: Payer: 59 | Admitting: Orthopaedic Surgery

## 2022-02-02 ENCOUNTER — Other Ambulatory Visit (HOSPITAL_COMMUNITY): Payer: Self-pay

## 2022-02-02 DIAGNOSIS — M25562 Pain in left knee: Secondary | ICD-10-CM | POA: Diagnosis not present

## 2022-02-02 DIAGNOSIS — G8929 Other chronic pain: Secondary | ICD-10-CM | POA: Diagnosis not present

## 2022-02-02 DIAGNOSIS — Z9889 Other specified postprocedural states: Secondary | ICD-10-CM | POA: Diagnosis not present

## 2022-02-02 DIAGNOSIS — G43009 Migraine without aura, not intractable, without status migrainosus: Secondary | ICD-10-CM

## 2022-02-02 MED ORDER — BUPIVACAINE HCL 0.5 % IJ SOLN
2.0000 mL | INTRAMUSCULAR | Status: AC | PRN
  Administered 2022-02-02: 2 mL via INTRA_ARTICULAR

## 2022-02-02 MED ORDER — LIDOCAINE HCL 1 % IJ SOLN
2.0000 mL | INTRAMUSCULAR | Status: AC | PRN
  Administered 2022-02-02: 2 mL

## 2022-02-02 MED ORDER — METHYLPREDNISOLONE ACETATE 40 MG/ML IJ SUSP
40.0000 mg | INTRAMUSCULAR | Status: AC | PRN
  Administered 2022-02-02: 40 mg via INTRA_ARTICULAR

## 2022-02-02 NOTE — Progress Notes (Signed)
? ?Office Visit Note ?  ?Patient: Julie Hansen           ?Date of Birth: 12/24/1970           ?MRN: [id number] ?Visit Date: 02/02/2022 ?             ?Requested by: Chesley Noon, MD ?[place],  [place] [address] ?PCP: Chesley Noon, MD ? ? ?Assessment & Plan: ?Visit Diagnoses:  ?1. S/P arthroscopy of left knee   ?2. Chronic pain of left knee   ? ? ?Plan: Impression is acute left knee pain with exacerbation and effusion.  Was able to aspirate 20 cc of synovial fluid and then I injected cortisone.  She will take it easy over the next couple weeks and then increase activity as tolerated.  Follow-up as needed. ? ?Follow-Up Instructions: No follow-ups on file.  ? ?Orders:  ?No orders of the defined types were placed in this encounter. ? ?No orders of the defined types were placed in this encounter. ? ? ? ? Procedures: ?Large Joint Inj: L knee on 02/02/2022 9:36 AM ?Details: 22 G needle ?Medications: 2 mL bupivacaine 0.5 %; 2 mL lidocaine 1 %; 40 mg methylPREDNISolone acetate 40 MG/ML ?Aspirate: 20 mL ?Outcome: tolerated well, no immediate complications ?Patient was prepped and draped in the usual sterile fashion.  ? ? ? ? ?Clinical Data: ?No additional findings. ? ? ?Subjective: ?Chief Complaint  ?Patient presents with  ? Left Knee - Pain  ? ? ?HPI ? ?Julie Hansen comes in today for recent worsening of left knee pain.  She felt a pop when she was walking about 3 weeks ago.  She has done 3 PT sessions since then.  She feels a lot of discomfort in the knee.  The pain is not reminiscent of the meniscus tear.  She reports start up stiffness and pain.  She has been taking Aleve and Tylenol and tramadol for breakthrough pain.  She has been working during this time. ? ?Review of Systems  ?Constitutional: Negative.   ?HENT: Negative.    ?Eyes: Negative.   ?Respiratory: Negative.    ?Cardiovascular: Negative.   ?Endocrine: Negative.   ?Musculoskeletal: Negative.   ?Neurological: Negative.    ?Hematological: Negative.   ?Psychiatric/Behavioral: Negative.    ?All other systems reviewed and are negative. ? ? ?Objective: ?Vital Signs: There were no vitals taken for this visit. ? ?Physical Exam ?Vitals and nursing note reviewed.  ?Constitutional:   ?   Appearance: She is well-developed.  ?Pulmonary:  ?   Effort: Pulmonary effort is normal.  ?Skin: ?   General: Skin is warm.  ?   Capillary Refill: Capillary refill takes less than 2 seconds.  ?Neurological:  ?   Mental Status: She is alert and oriented to person, place, and time.  ?Psychiatric:     ?   Behavior: Behavior normal.     ?   Thought Content: Thought content normal.     ?   Judgment: Judgment normal.  ? ? ?Ortho Exam ? ?Examination of the left knee shows joint effusion.  No significant joint line tenderness.  No real limitation in range of motion.  Collaterals and cruciates are stable. ? ?Specialty Comments:  ?No specialty comments available. ? ?Imaging: ?No results found. ? ? ?PMFS History: ?Patient Active Problem List  ? Diagnosis Date Noted  ? S/P arthroscopy of left knee 02/02/2022  ? Chronic pain of left knee 02/02/2022  ? Acute medial meniscal injury of  knee, left, initial encounter 07/18/2021  ? Chronic migraine without aura without status migrainosus, not intractable 04/14/2021  ? Muscle spasm 06/20/2018  ? Unspecified dyspareunia (CODE) 03/20/2017  ? Amenorrhea 03/20/2017  ? Irritable bowel syndrome 01/01/2017  ? Status migrainosus 01/01/2017  ? Anxiety 01/01/2017  ? Vaginal discharge 12/17/2016  ? GERD (gastroesophageal reflux disease) 10/30/2016  ? Mixed hyperlipidemia 10/30/2016  ? Diabetes type 2, controlled ([facility]) 09/12/2015  ? OSA (obstructive sleep apnea) 02/16/2015  ? Asthma 10/29/2011  ? ?Past Medical History:  ?Diagnosis Date  ? Acid reflux   ? Asthma   ? Diabetes mellitus without complication ([facility])   ? Hyperlipidemia   ? PCOS (polycystic ovarian syndrome)   ? PONV (postoperative nausea and vomiting)   ? Sleep apnea   ? uses cpap   ?  ?Family History  ?Problem Relation Age of Onset  ? Cancer Mother   ? Hypertension Mother   ? Diabetes Mother   ? Breast cancer Mother 55  ? Cancer Maternal Grandmother   ? Breast cancer Maternal Grandmother 44  ? Cancer Father   ? Diabetes Father   ? Hypertension Father   ? Breast cancer Paternal Aunt 13  ? Breast cancer Paternal Aunt 20  ? Breast cancer Paternal Aunt 81  ?  ?Past Surgical History:  ?Procedure Laterality Date  ? BREAST EXCISIONAL BIOPSY Right 06/28/2020  ? papilloma  ? DILATION AND EVACUATION N/A 06/05/2017  ? Procedure: DILATATION AND EVACUATION;  Surgeon: Sanjuana Kava, MD;  Location: [facility];  Service: Gynecology;  Laterality: N/A;  ? KNEE ARTHROSCOPY WITH MENISCAL REPAIR Left 09/20/2021  ? Procedure: LEFT KNEE PARTIAL MEDIAL MENISCECTOMY;  Surgeon: Leandrew Koyanagi, MD;  Location: [facility];  Service: Orthopedics;  Laterality: Left;  ? RADIOACTIVE SEED GUIDED EXCISIONAL BREAST BIOPSY Right 06/28/2020  ? Procedure: RIGHT RADIOACTIVE SEED GUIDED EXCISIONAL BREAST BIOPSY;  Surgeon: Rolm Bookbinder, MD;  Location: [facility];  Service: General;  Laterality: Right;  ? WISDOM TOOTH EXTRACTION    ? ?Social History  ? ?Occupational History  ? Not on file  ?Tobacco Use  ? Smoking status: Never  ?  Passive exposure: Never  ? Smokeless tobacco: Never  ?Vaping Use  ? Vaping Use: Never used  ?Substance and Sexual Activity  ? Alcohol use: Yes  ?  Comment: occas wine  ? Drug use: No  ? Sexual activity: Yes  ?  Birth control/protection: None  ? ? ? ? ? ? ?

## 2022-02-03 ENCOUNTER — Other Ambulatory Visit (HOSPITAL_COMMUNITY): Payer: Self-pay

## 2022-02-09 ENCOUNTER — Other Ambulatory Visit (HOSPITAL_COMMUNITY): Payer: Self-pay

## 2022-02-09 MED ORDER — PROMETHAZINE HCL 25 MG PO TABS
ORAL_TABLET | Freq: Four times a day (QID) | ORAL | 1 refills | Status: DC | PRN
  Filled 2022-02-09: qty 30, 7d supply, fill #0
  Filled 2022-04-25: qty 30, 7d supply, fill #1

## 2022-02-12 DIAGNOSIS — G4733 Obstructive sleep apnea (adult) (pediatric): Secondary | ICD-10-CM | POA: Diagnosis not present

## 2022-02-13 ENCOUNTER — Other Ambulatory Visit (HOSPITAL_COMMUNITY): Payer: Self-pay

## 2022-02-13 DIAGNOSIS — G4733 Obstructive sleep apnea (adult) (pediatric): Secondary | ICD-10-CM | POA: Diagnosis not present

## 2022-02-13 DIAGNOSIS — G8929 Other chronic pain: Secondary | ICD-10-CM | POA: Diagnosis not present

## 2022-02-13 DIAGNOSIS — M25562 Pain in left knee: Secondary | ICD-10-CM | POA: Diagnosis not present

## 2022-02-13 DIAGNOSIS — K219 Gastro-esophageal reflux disease without esophagitis: Secondary | ICD-10-CM | POA: Diagnosis not present

## 2022-02-13 DIAGNOSIS — Z794 Long term (current) use of insulin: Secondary | ICD-10-CM | POA: Diagnosis not present

## 2022-02-13 DIAGNOSIS — E119 Type 2 diabetes mellitus without complications: Secondary | ICD-10-CM | POA: Diagnosis not present

## 2022-02-13 DIAGNOSIS — E782 Mixed hyperlipidemia: Secondary | ICD-10-CM | POA: Diagnosis not present

## 2022-02-13 DIAGNOSIS — I10 Essential (primary) hypertension: Secondary | ICD-10-CM | POA: Diagnosis not present

## 2022-02-13 MED ORDER — LISINOPRIL 5 MG PO TABS
ORAL_TABLET | ORAL | 5 refills | Status: DC
  Filled 2022-02-13: qty 30, 30d supply, fill #0
  Filled 2022-03-05: qty 30, 30d supply, fill #1
  Filled 2022-04-05: qty 30, 30d supply, fill #2
  Filled 2022-05-19: qty 30, 30d supply, fill #3
  Filled 2022-07-06 – 2022-07-30 (×2): qty 30, 30d supply, fill #4

## 2022-02-14 ENCOUNTER — Other Ambulatory Visit (HOSPITAL_COMMUNITY): Payer: Self-pay

## 2022-02-15 ENCOUNTER — Other Ambulatory Visit (HOSPITAL_COMMUNITY)
Admission: RE | Admit: 2022-02-15 | Discharge: 2022-02-15 | Disposition: A | Discharge status: Home / Self Care | Payer: 59 | Source: Ambulatory Visit | Attending: Endocrinology | Admitting: Endocrinology

## 2022-02-15 DIAGNOSIS — E7800 Pure hypercholesterolemia, unspecified: Secondary | ICD-10-CM | POA: Diagnosis not present

## 2022-02-15 LAB — LIPID PANEL
Cholesterol: 167 mg/dL (ref 0–200)
HDL: 55 mg/dL (ref >40)
LDL Cholesterol: 83 mg/dL (ref 0–99)
Total CHOL/HDL Ratio: 3 RATIO
Triglycerides: 145 mg/dL (ref <150)
VLDL: 29 mg/dL (ref 0–40)

## 2022-02-15 LAB — COMPREHENSIVE METABOLIC PANEL
ALT: 14 U/L (ref 0–44)
AST: 11 U/L — ABNORMAL LOW (ref 15–41)
Albumin: 3.7 g/dL (ref 3.5–5.0)
Alkaline Phosphatase: 129 U/L — ABNORMAL HIGH (ref 38–126)
Anion gap: 10 (ref 5–15)
BUN: 14 mg/dL (ref 6–20)
CO2: 23 mmol/L (ref 22–32)
Calcium: 9.2 mg/dL (ref 8.9–10.3)
Chloride: 106 mmol/L (ref 98–111)
Creatinine, Ser: 0.94 mg/dL (ref 0.44–1.00)
GFR, Estimated: >60 mL/min (ref >60)
Glucose, Bld: 255 mg/dL — ABNORMAL HIGH (ref 70–99)
Potassium: 3.8 mmol/L (ref 3.5–5.1)
Sodium: 139 mmol/L (ref 135–145)
Total Bilirubin: 0.5 mg/dL (ref 0.3–1.2)
Total Protein: 7.3 g/dL (ref 6.5–8.1)

## 2022-02-15 LAB — HEMOGLOBIN A1C
Hgb A1c MFr Bld: 7.8 % — ABNORMAL HIGH (ref 4.8–5.6)
Mean Plasma Glucose: 177.16 mg/dL

## 2022-02-16 ENCOUNTER — Other Ambulatory Visit (HOSPITAL_COMMUNITY): Payer: Self-pay

## 2022-02-16 DIAGNOSIS — R232 Flushing: Secondary | ICD-10-CM | POA: Diagnosis not present

## 2022-02-16 DIAGNOSIS — R6882 Decreased libido: Secondary | ICD-10-CM | POA: Diagnosis not present

## 2022-02-16 DIAGNOSIS — E7800 Pure hypercholesterolemia, unspecified: Secondary | ICD-10-CM | POA: Diagnosis not present

## 2022-02-16 DIAGNOSIS — G479 Sleep disorder, unspecified: Secondary | ICD-10-CM | POA: Diagnosis not present

## 2022-02-16 DIAGNOSIS — Z6841 Body Mass Index (BMI) 40.0 and over, adult: Secondary | ICD-10-CM | POA: Diagnosis not present

## 2022-02-16 DIAGNOSIS — G4733 Obstructive sleep apnea (adult) (pediatric): Secondary | ICD-10-CM | POA: Diagnosis not present

## 2022-02-16 DIAGNOSIS — I10 Essential (primary) hypertension: Secondary | ICD-10-CM | POA: Diagnosis not present

## 2022-02-16 DIAGNOSIS — E1165 Type 2 diabetes mellitus with hyperglycemia: Secondary | ICD-10-CM | POA: Diagnosis not present

## 2022-02-16 DIAGNOSIS — R1013 Epigastric pain: Secondary | ICD-10-CM | POA: Diagnosis not present

## 2022-02-16 DIAGNOSIS — G43909 Migraine, unspecified, not intractable, without status migrainosus: Secondary | ICD-10-CM | POA: Diagnosis not present

## 2022-02-16 DIAGNOSIS — N951 Menopausal and female climacteric states: Secondary | ICD-10-CM | POA: Diagnosis not present

## 2022-02-16 LAB — MISC LABCORP TEST (SEND OUT): Labcorp test code: 140285

## 2022-02-20 ENCOUNTER — Other Ambulatory Visit: Payer: Self-pay | Admitting: Endocrinology

## 2022-02-20 ENCOUNTER — Other Ambulatory Visit (HOSPITAL_COMMUNITY): Payer: Self-pay

## 2022-02-20 DIAGNOSIS — R1013 Epigastric pain: Secondary | ICD-10-CM

## 2022-02-21 ENCOUNTER — Other Ambulatory Visit (HOSPITAL_COMMUNITY): Payer: Self-pay

## 2022-02-21 ENCOUNTER — Ambulatory Visit
Admission: RE | Admit: 2022-02-21 | Discharge: 2022-02-21 | Disposition: A | Discharge status: Home / Self Care | Payer: 59 | Source: Ambulatory Visit | Attending: General Surgery | Admitting: General Surgery

## 2022-02-21 ENCOUNTER — Encounter: Payer: Self-pay | Admitting: Orthopaedic Surgery

## 2022-02-21 ENCOUNTER — Other Ambulatory Visit: Payer: Self-pay | Admitting: General Surgery

## 2022-02-21 DIAGNOSIS — Z1231 Encounter for screening mammogram for malignant neoplasm of breast: Secondary | ICD-10-CM

## 2022-02-21 IMAGING — MG MM DIGITAL SCREENING BILAT W/ TOMO AND CAD
8 series · 8 of 24 positions shown · non-contrast
Comparison: Previous exam(s).

CLINICAL DATA: Screening.

EXAM:
DIGITAL SCREENING BILATERAL MAMMOGRAM WITH TOMOSYNTHESIS AND CAD
TECHNIQUE: Bilateral screening digital craniocaudal and mediolateral oblique
mammograms were obtained. Bilateral screening digital breast
tomosynthesis was performed. The images were evaluated with
computer-aided detection.

[R MLO synth-2D]
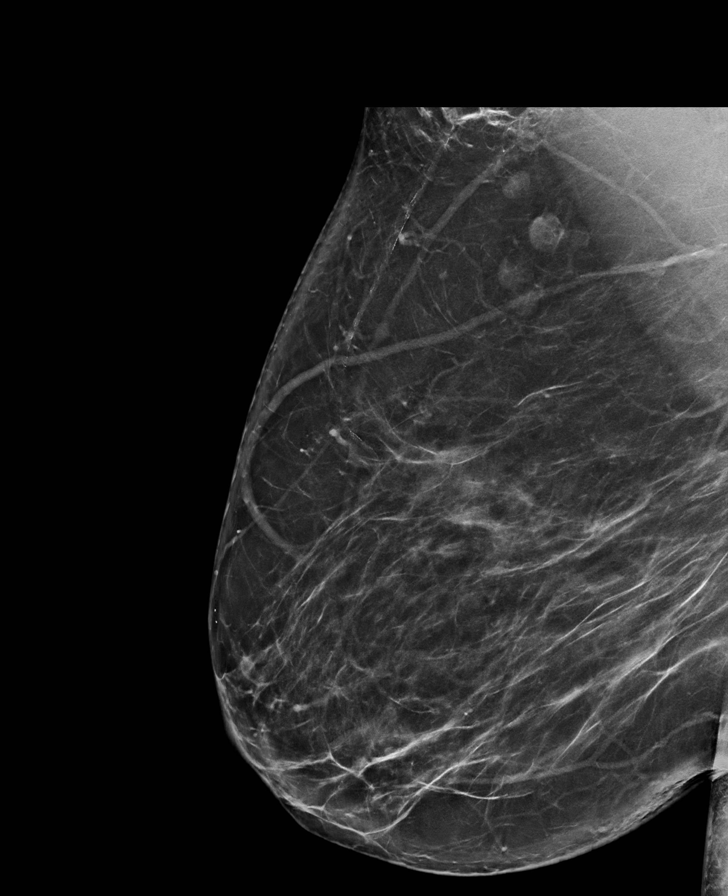

[L MLO synth-2D]
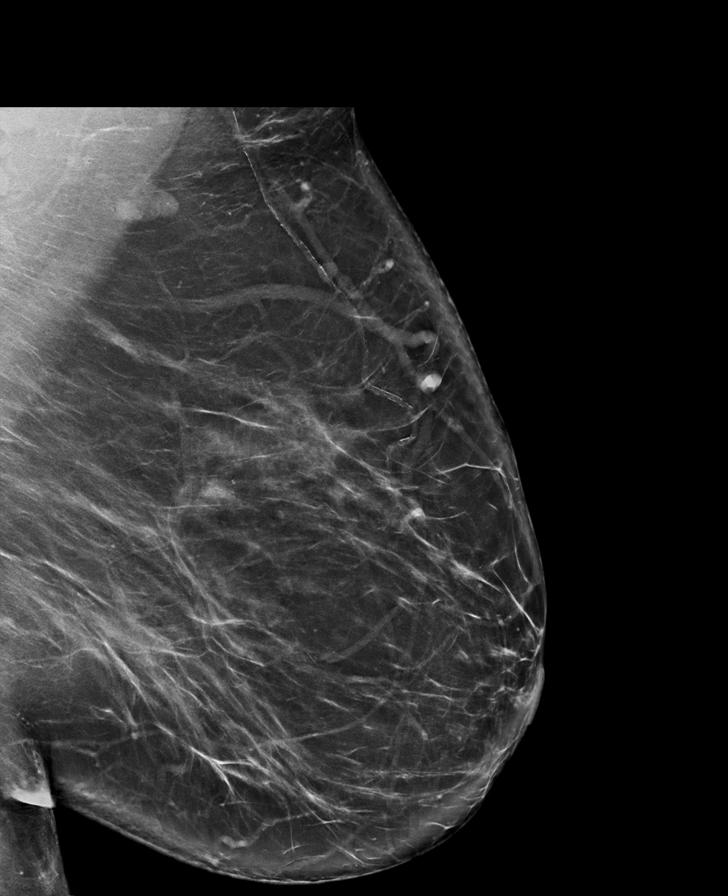

[R CC synth-2D]
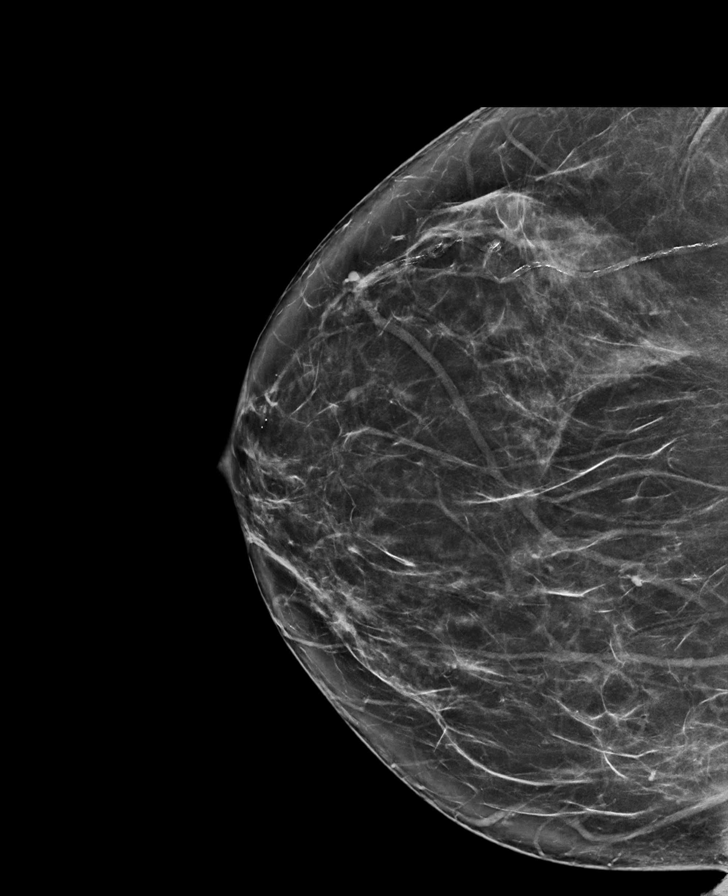

[L CC synth-2D]
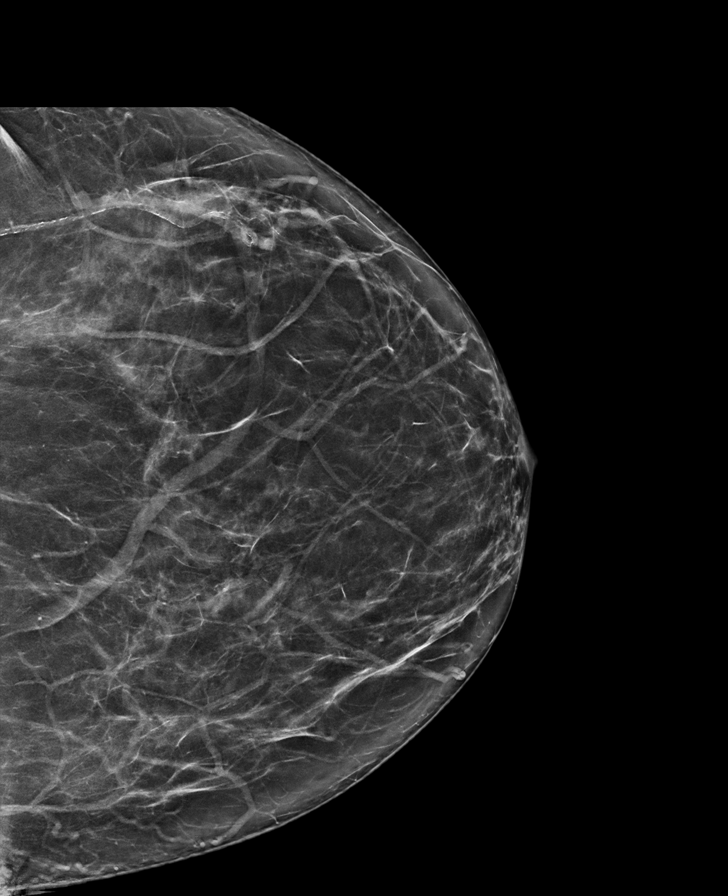

[R CC tomo · tomo slice 43/84.0]
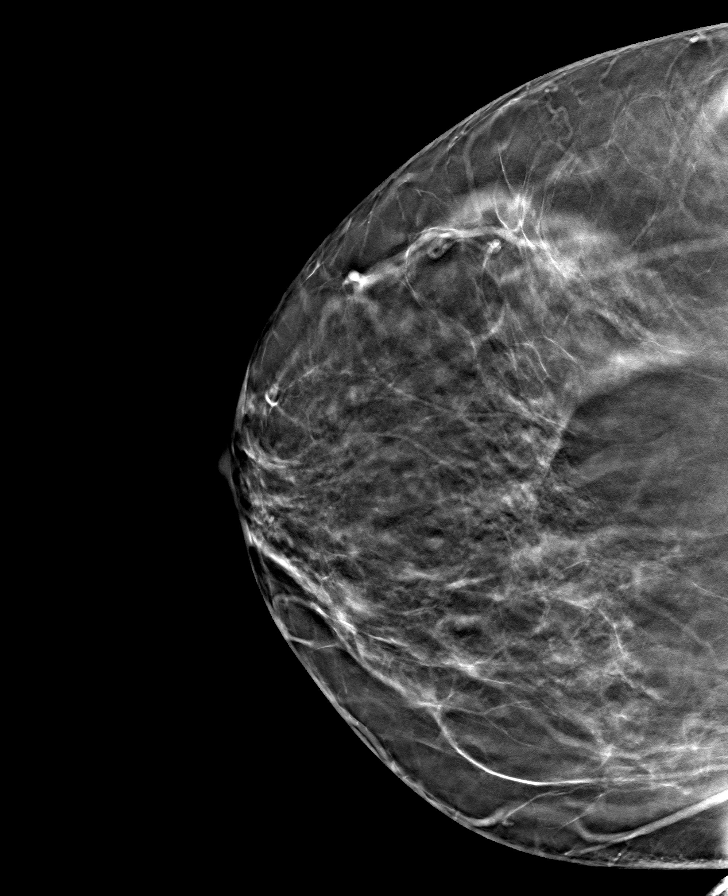

[R MLO tomo · tomo slice 50/99.0]
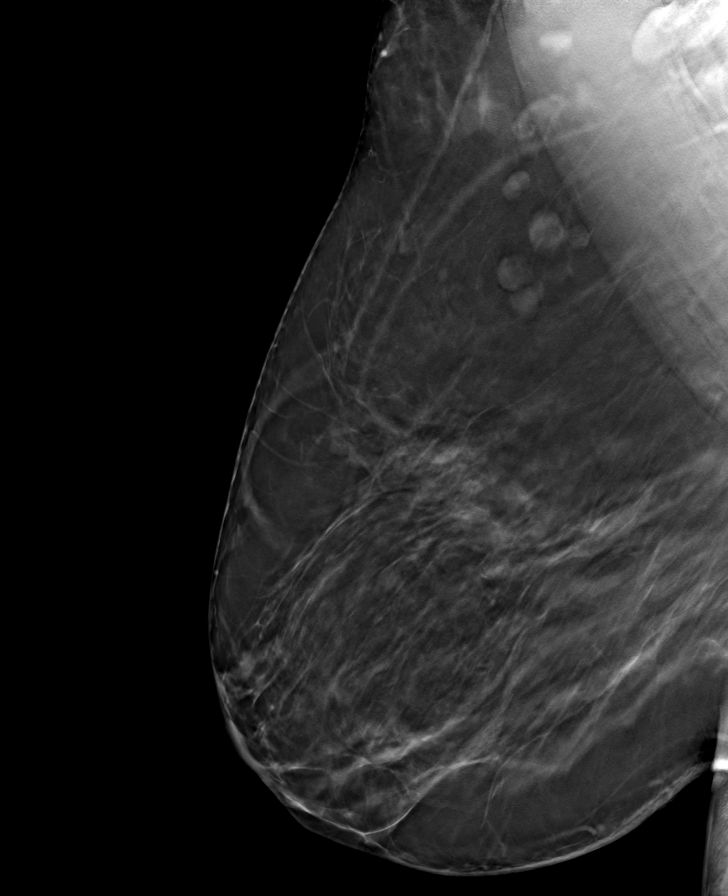

[L CC tomo · tomo slice 42/83.0]
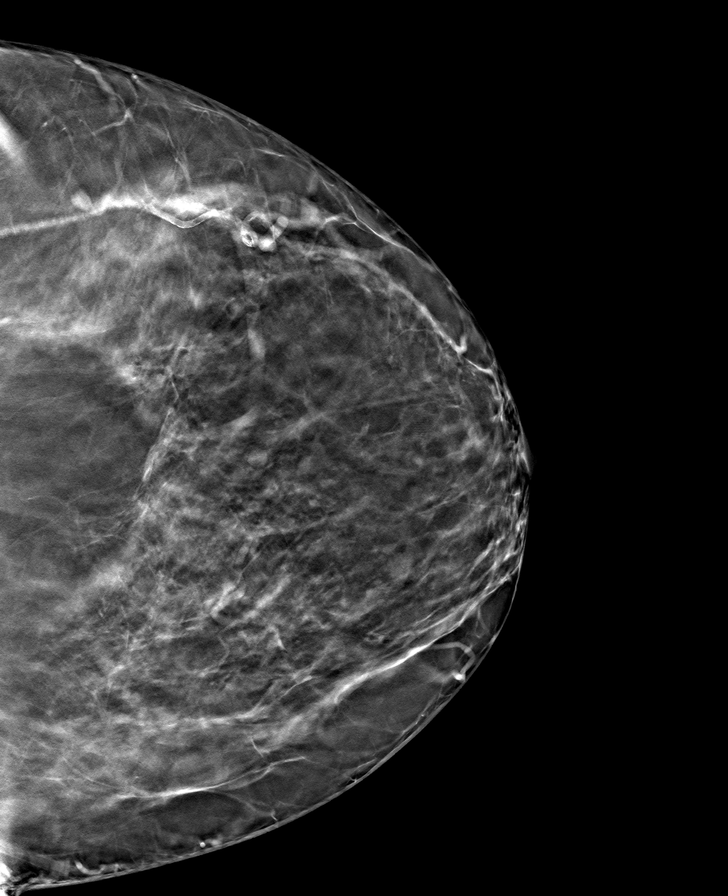

[L MLO tomo · tomo slice 53/105.0]
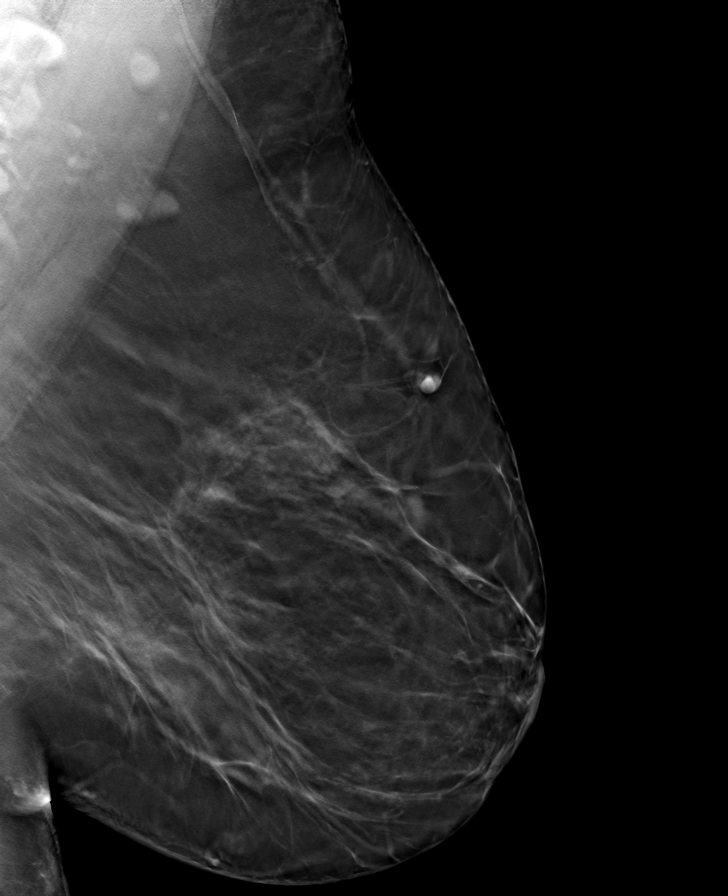

[8 of 24 positions shown; findings below may reference images not displayed]

ACR Breast Density Category b: There are scattered areas of
fibroglandular density.
FINDINGS: There are no findings suspicious for malignancy.
IMPRESSION: No mammographic evidence of malignancy. A result letter of this
screening mammogram will be mailed directly to the patient.

RECOMMENDATION:
Screening mammogram in one year. (Code:[BY])

BI-RADS CATEGORY  1: Negative.

## 2022-02-21 MED ORDER — OMEPRAZOLE 40 MG PO CPDR
DELAYED_RELEASE_CAPSULE | ORAL | 1 refills | Status: DC
  Filled 2022-02-21: qty 30, 30d supply, fill #0
  Filled 2022-03-24: qty 30, 30d supply, fill #1

## 2022-02-22 NOTE — Telephone Encounter (Signed)
Ok let's get MRI

## 2022-02-23 ENCOUNTER — Other Ambulatory Visit: Payer: Self-pay

## 2022-02-23 DIAGNOSIS — Z9889 Other specified postprocedural states: Secondary | ICD-10-CM

## 2022-02-26 ENCOUNTER — Encounter: Payer: Self-pay | Admitting: *Deleted

## 2022-02-26 DIAGNOSIS — G4733 Obstructive sleep apnea (adult) (pediatric): Secondary | ICD-10-CM | POA: Diagnosis not present

## 2022-03-01 ENCOUNTER — Ambulatory Visit
Admission: RE | Admit: 2022-03-01 | Discharge: 2022-03-01 | Disposition: A | Discharge status: Home / Self Care | Payer: 59 | Source: Ambulatory Visit | Attending: Endocrinology | Admitting: Endocrinology

## 2022-03-01 DIAGNOSIS — R1013 Epigastric pain: Secondary | ICD-10-CM | POA: Diagnosis not present

## 2022-03-01 IMAGING — US US ABDOMEN COMPLETE
1 series · 14 of 25 positions shown · non-contrast
Comparison: None Available.

CLINICAL DATA: Epigastric pain

EXAM:
ABDOMEN ULTRASOUND COMPLETE

[Series 1: us abdomen complete · 0.18mm/px · 14 of 92 slices shown]
[im 1/92]
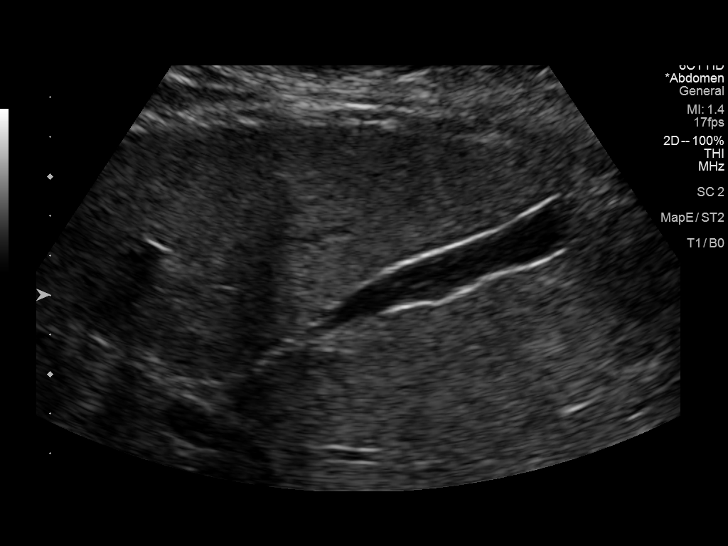
[im 8/92]
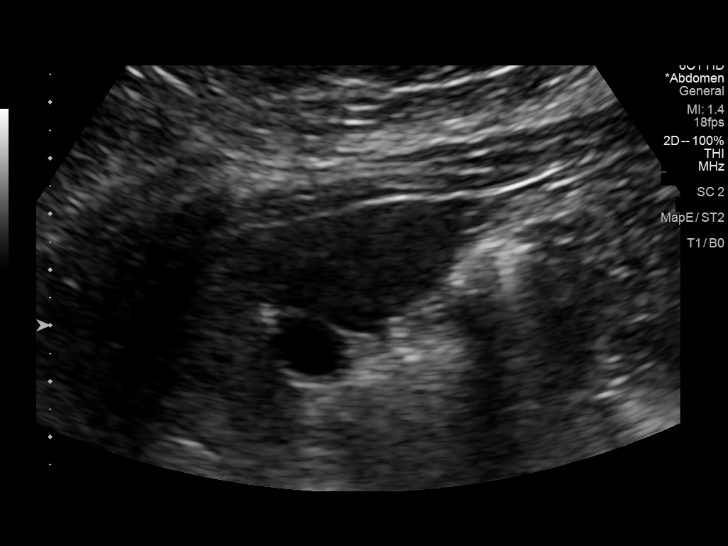
[im 16/92]
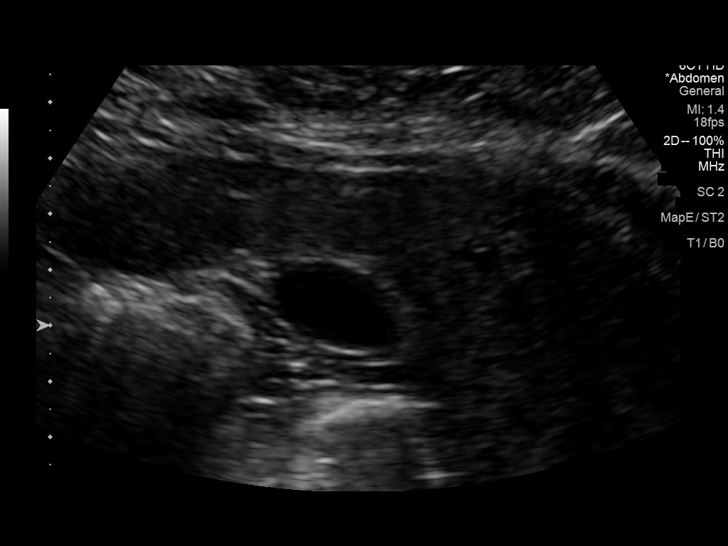
[im 23/92]
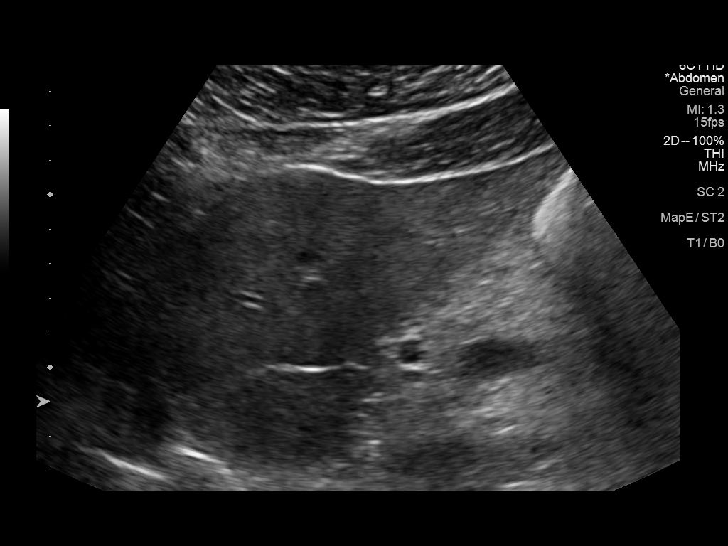
[im 31/92]
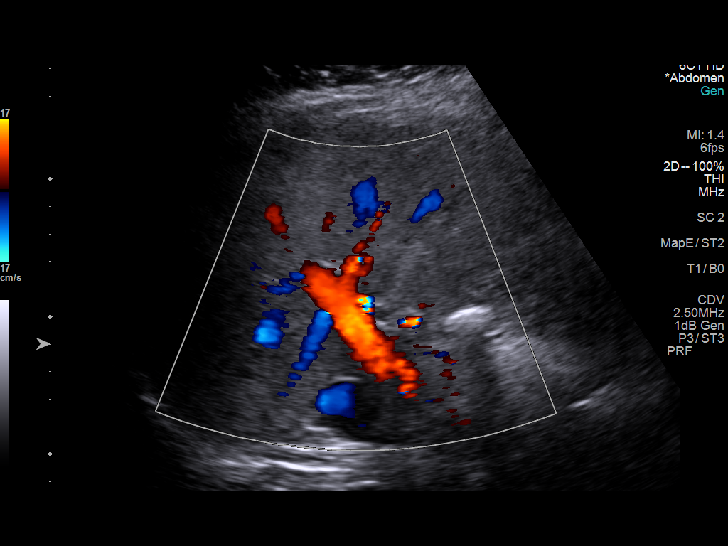
[im 35/92]
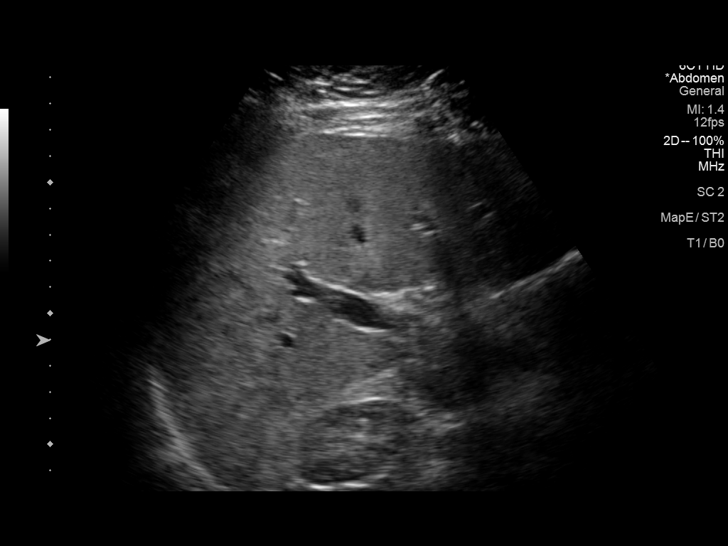
[im 42/92]
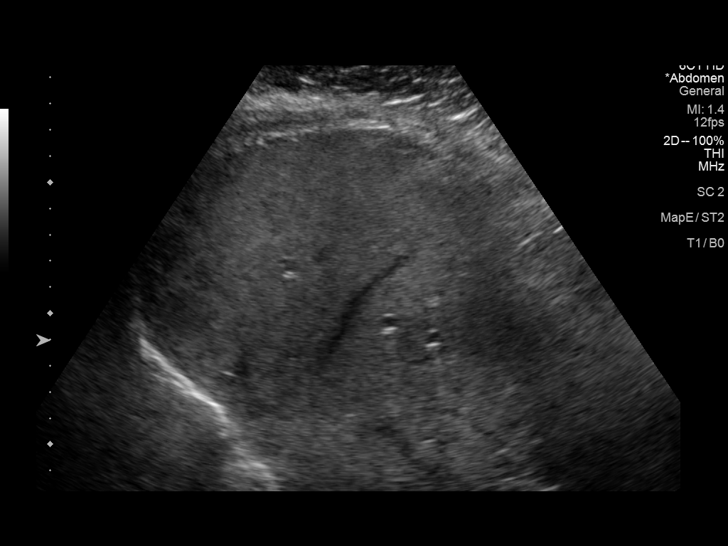
[im 50/92]
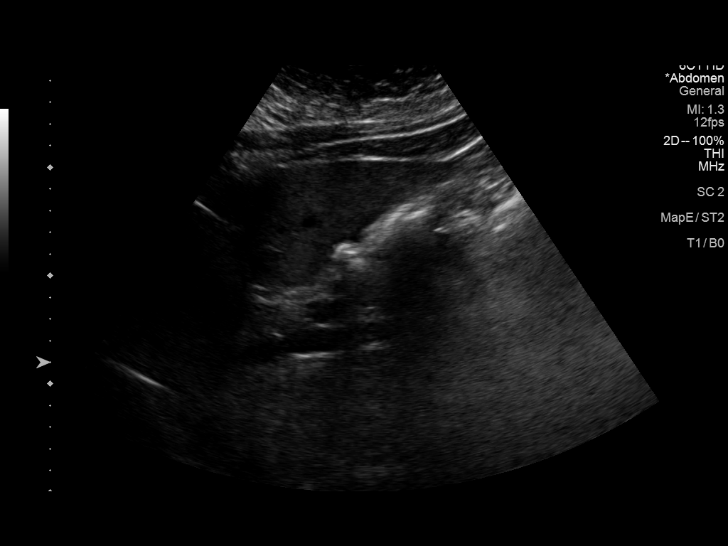
[im 57/92]
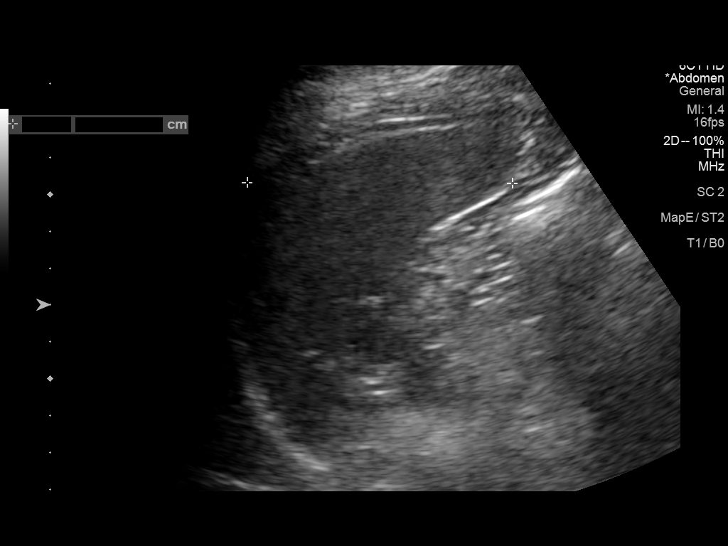
[im 61/92]
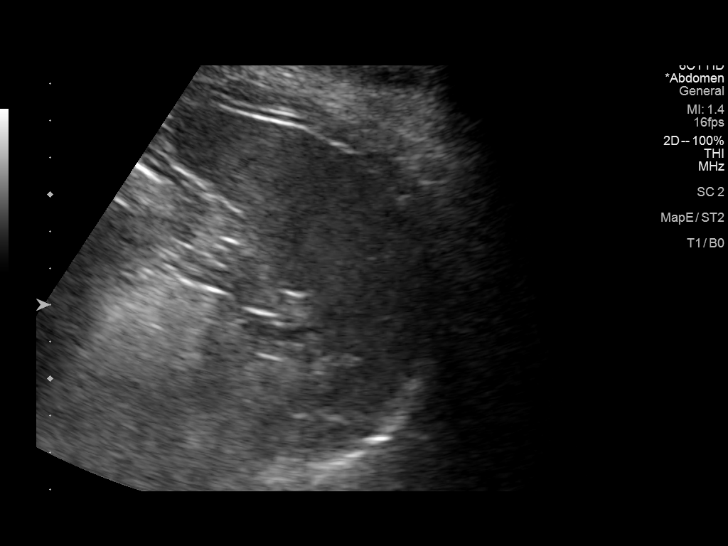
[im 69/92]
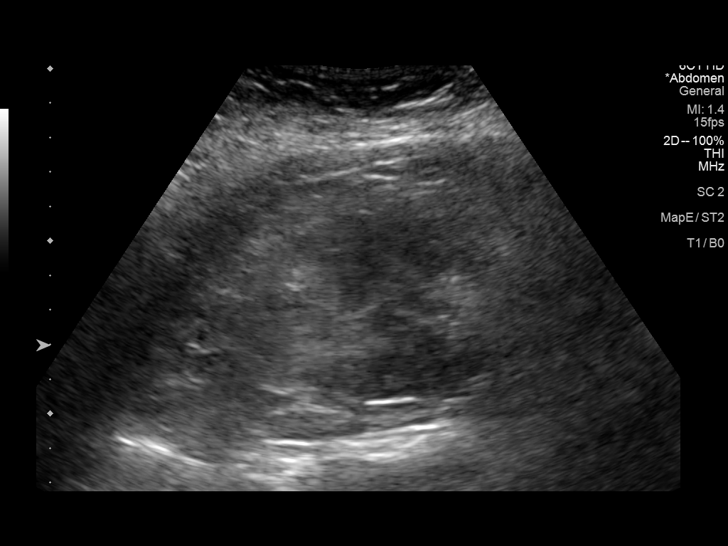
[im 76/92]
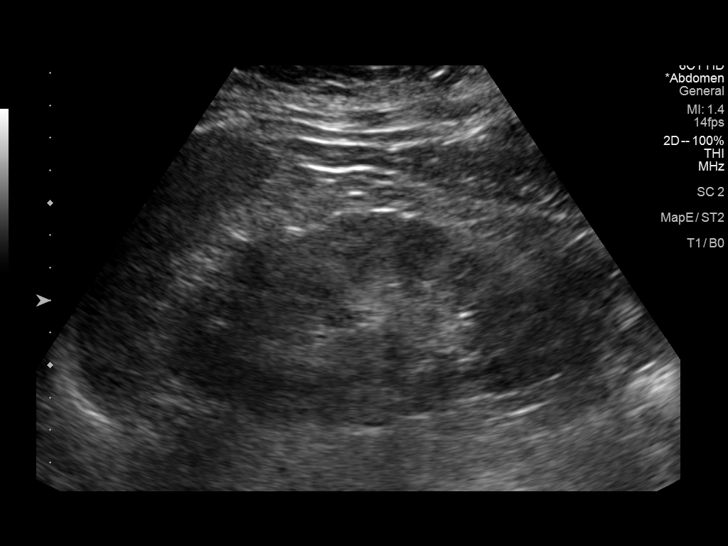
[im 84/92]
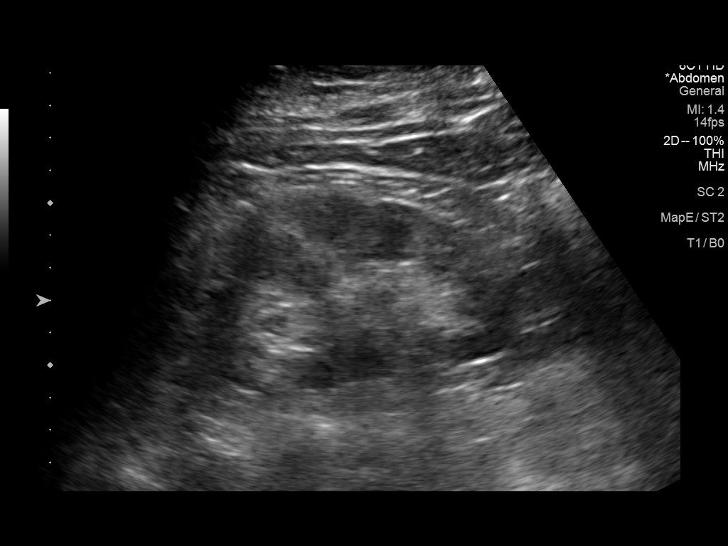
[im 92/92]
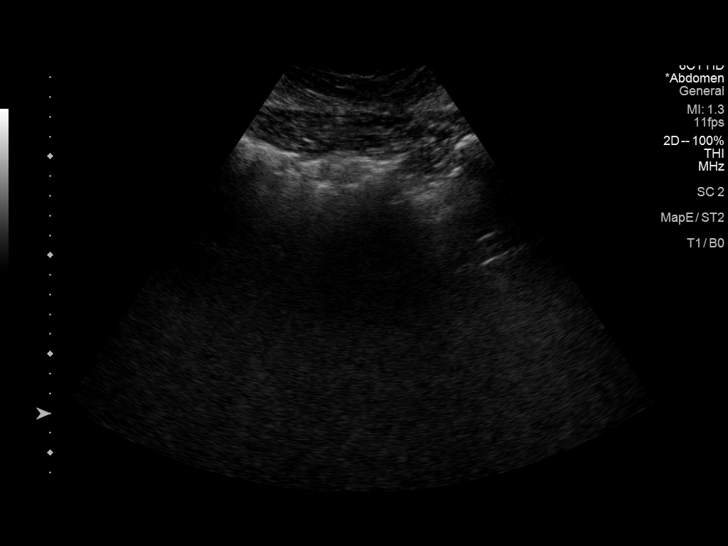

[14 of 25 positions shown; findings below may reference images not displayed]

FINDINGS: Limited study due to bowel gas.

Gallbladder: No gallstones or wall thickening visualized. No
sonographic Murphy sign noted by sonographer.

Common bile duct: Diameter: 4 mm

Liver: Inhomogeneous, increased echogenicity of the liver
parenchyma, with no focal mass identified. Portal vein is patent on
color Doppler imaging with normal direction of blood flow towards
the liver.

IVC: No abnormality visualized.

Pancreas: Visualized portion unremarkable.

Spleen: Size and appearance within normal limits.

Right Kidney: Length: 12.8 cm. Questionable mildly increased
echogenicity. No mass or hydronephrosis visualized.

Left Kidney: Length: 13 cm. Questionable mildly increased
echogenicity. No mass or hydronephrosis visualized.

Abdominal aorta: No aneurysm visualized.

Other findings: None.
IMPRESSION: 1. Abnormal appearance of the liver parenchyma suggesting hepatic
steatosis and/or other hepatocellular disease.
2. Possible mildly increased echogenicity of the kidneys versus
artifact, correlate for possible medical renal disease.

## 2022-03-05 ENCOUNTER — Ambulatory Visit
Admission: RE | Admit: 2022-03-05 | Discharge: 2022-03-05 | Disposition: A | Discharge status: Home / Self Care | Payer: 59 | Source: Ambulatory Visit | Attending: Orthopaedic Surgery | Admitting: Orthopaedic Surgery

## 2022-03-05 ENCOUNTER — Other Ambulatory Visit (HOSPITAL_COMMUNITY): Payer: Self-pay

## 2022-03-05 DIAGNOSIS — Z9889 Other specified postprocedural states: Secondary | ICD-10-CM

## 2022-03-05 DIAGNOSIS — R600 Localized edema: Secondary | ICD-10-CM | POA: Diagnosis not present

## 2022-03-05 IMAGING — MR MR KNEE*L* W/O CM
4 of 7 series · 23 of 40 positions shown · non-contrast
Comparison: [DATE]

CLINICAL DATA: Left knee pain status post arthroscopy [DATE].

EXAM:
MRI OF THE LEFT KNEE WITHOUT CONTRAST
TECHNIQUE: Multiplanar, multisequence MR imaging of the knee was performed. No
intravenous contrast was administered.

[Series 3: T2 fat-sat · axial · 4.0mm · 0.50mm/px · z∈[-39,+86]mm · 7 of 26 slices shown]
[im 1/26]
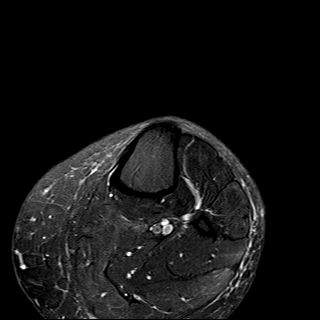
[im 5/26]
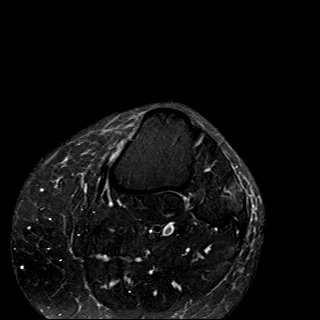
[im 9/26]
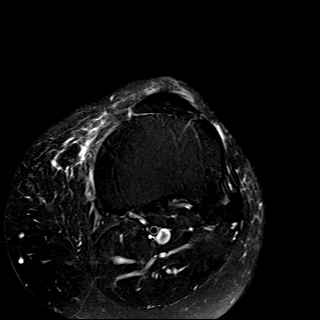
[im 13/26]
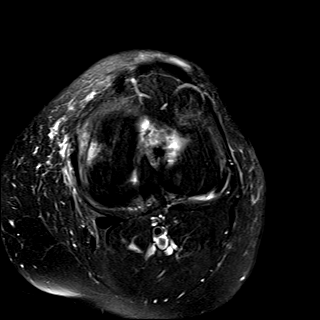
[im 17/26]
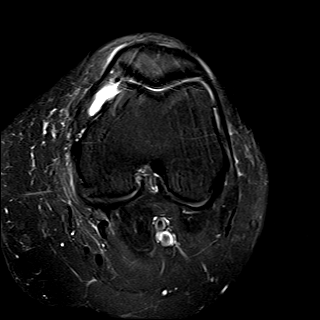
[im 21/26]
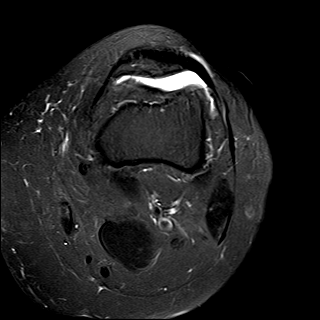
[im 26/26]
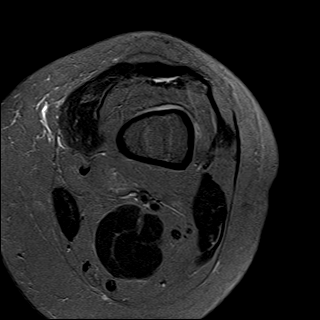

[Series 7: PD fat-sat · sagittal · 3.0mm · 0.29mm/px · 6 of 27 slices shown (1 of 3)]
[im 1/27]
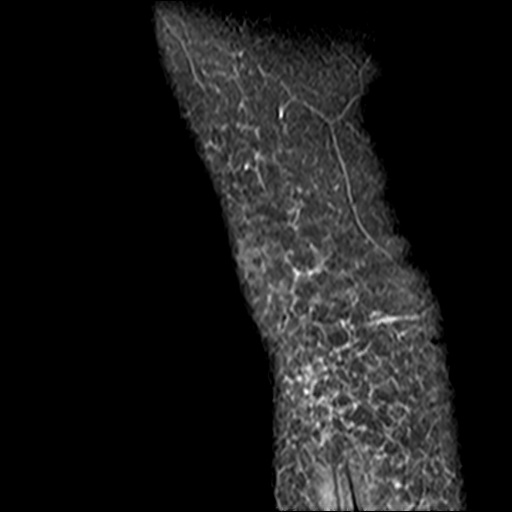
[im 6/27]
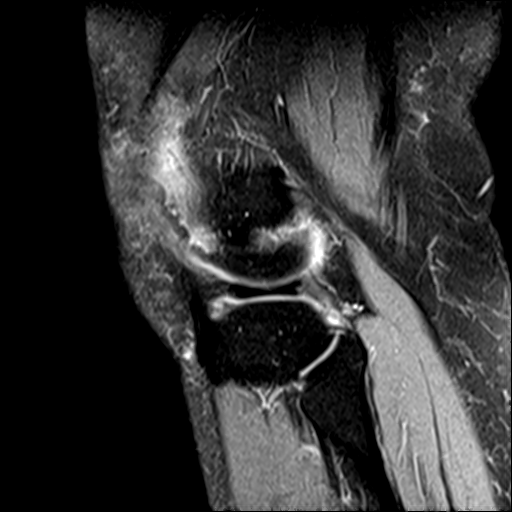
[im 11/27]
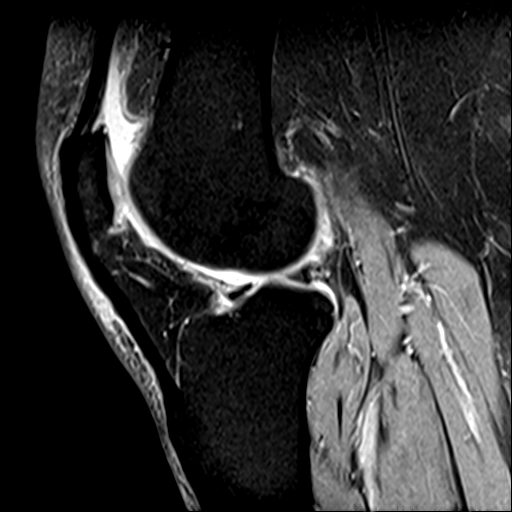
[im 16/27]
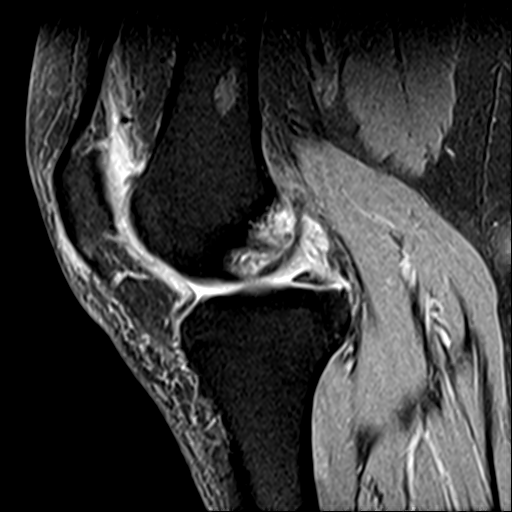
[im 21/27]
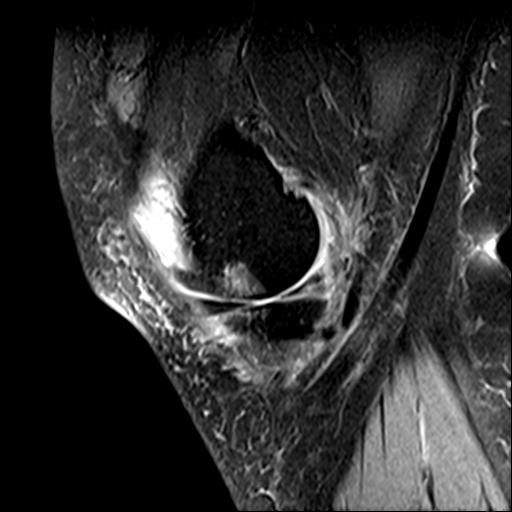
[im 27/27]
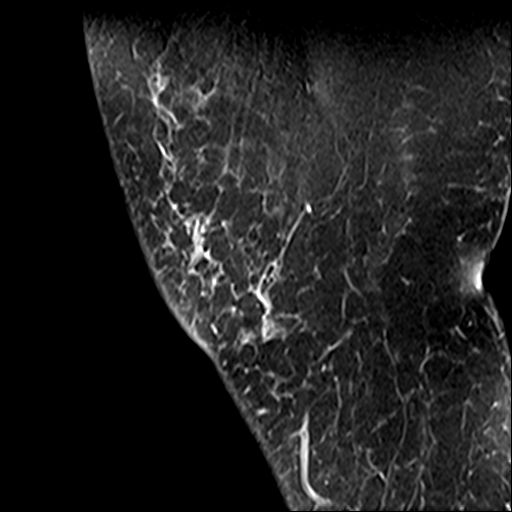

[Series 8: PD fat-sat · coronal · 3.0mm · 0.29mm/px · 7 of 28 slices shown (2 of 3)]
[im 1/28]
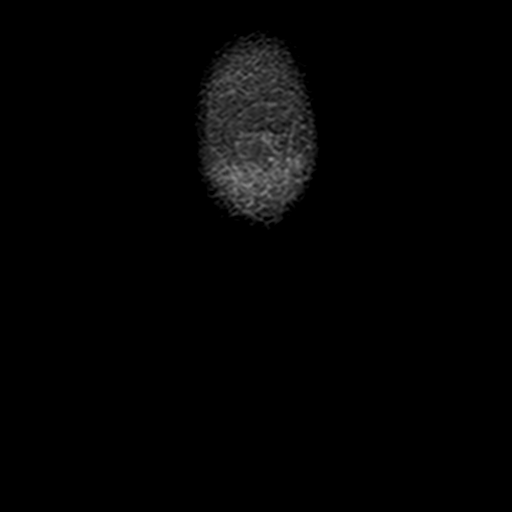
[im 5/28]
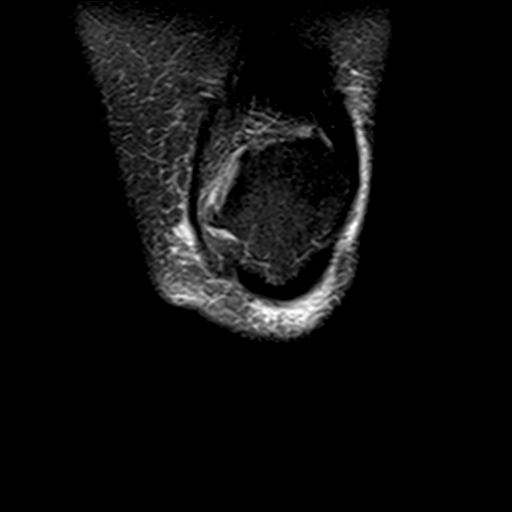
[im 10/28]
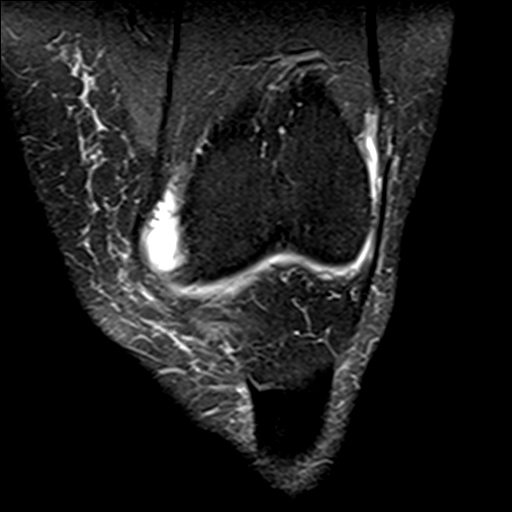
[im 14/28]
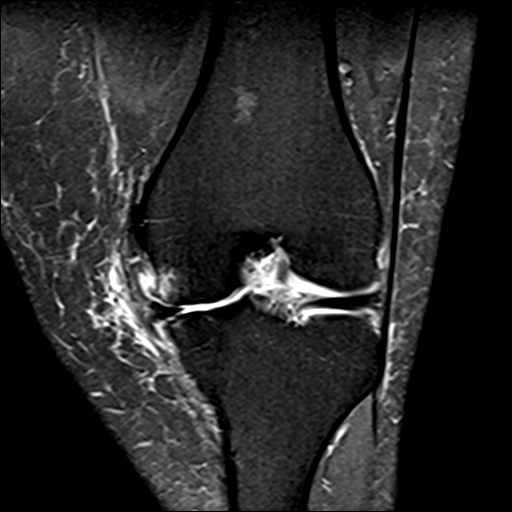
[im 19/28]
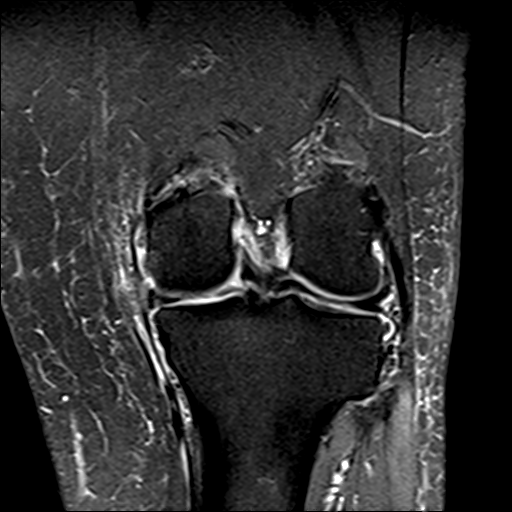
[im 23/28]
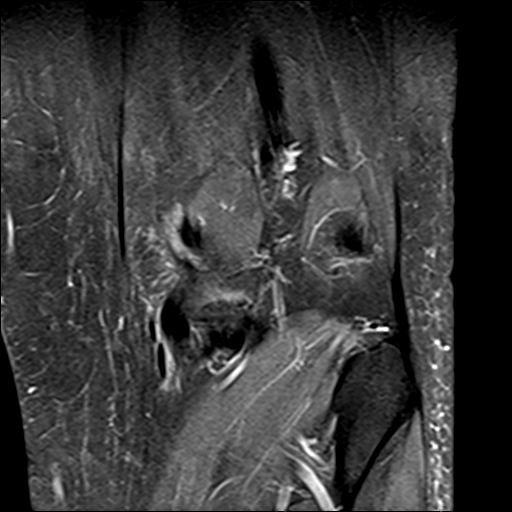
[im 28/28]
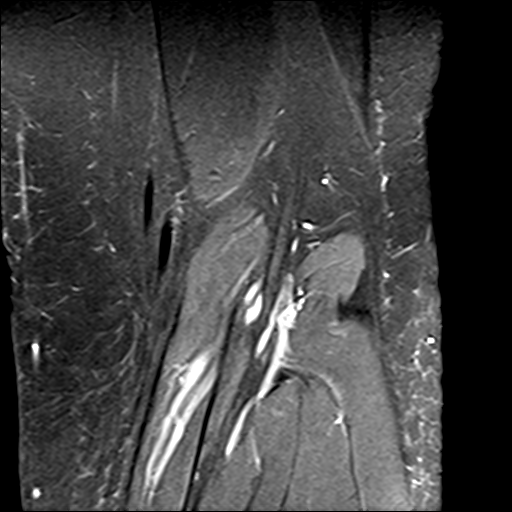

[Series 9: PD fat-sat · coronal · 2.0mm · 0.29mm/px · 3 of 12 slices shown (3 of 3)]
[im 1/12]
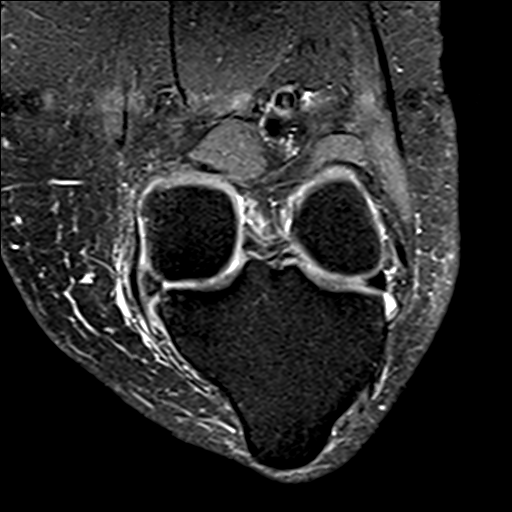
[im 6/12]
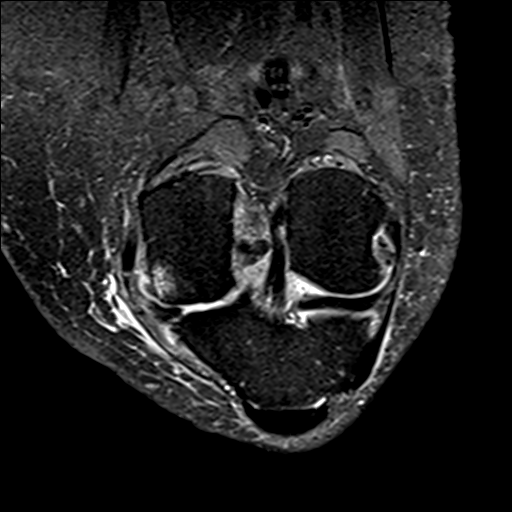
[im 12/12]
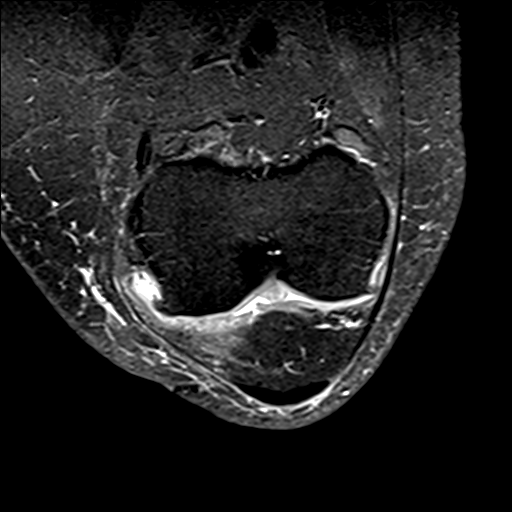

[23 of 40 positions shown; findings below may reference images not displayed]

FINDINGS: MENISCI

Medial: Degeneration of the posterior horn of the medial meniscus.
Peripheral meniscal extrusion. No discrete recurrent tear.

Lateral: Tiny radial tear of the free edge of the body of the
lateral meniscus.

LIGAMENTS

Cruciates: ACL and PCL are intact.

Collaterals: Medial collateral ligament is intact. Lateral
collateral ligament complex is intact.

CARTILAGE

Patellofemoral:  No chondral defect.

Medial: Full-thickness cartilage loss of the medial femorotibial
compartment subchondral reactive marrow edema and marginal
osteophytes.

Lateral:  No chondral defect.

JOINT: Small joint effusion. Normal MAHER. No plical
thickening. Mild synovitis along the anterior margin of the ACL.

POPLITEAL FOSSA: Popliteus tendon is intact. No Baker's cyst.

EXTENSOR MECHANISM: Intact quadriceps tendon. Intact patellar
tendon. Intact lateral patellar retinaculum. Intact medial patellar
retinaculum. Intact MPFL.

BONES: No aggressive osseous lesion. No fracture or dislocation.

Other: No fluid collection or hematoma. Muscles are normal.
IMPRESSION: 1. Degeneration of the posterior horn of the medial meniscus.
Peripheral meniscal extrusion. No discrete recurrent tear.
2. Tiny radial tear of the free edge of the body of the lateral
meniscus.
3. Full-thickness cartilage loss of the medial femorotibial
compartment subchondral reactive marrow edema and marginal
osteophytes.

## 2022-03-06 ENCOUNTER — Other Ambulatory Visit (HOSPITAL_COMMUNITY): Payer: Self-pay

## 2022-03-06 ENCOUNTER — Other Ambulatory Visit: Payer: 59

## 2022-03-06 DIAGNOSIS — K5904 Chronic idiopathic constipation: Secondary | ICD-10-CM | POA: Diagnosis not present

## 2022-03-06 DIAGNOSIS — K760 Fatty (change of) liver, not elsewhere classified: Secondary | ICD-10-CM | POA: Diagnosis not present

## 2022-03-06 DIAGNOSIS — K219 Gastro-esophageal reflux disease without esophagitis: Secondary | ICD-10-CM | POA: Diagnosis not present

## 2022-03-06 DIAGNOSIS — K5730 Diverticulosis of large intestine without perforation or abscess without bleeding: Secondary | ICD-10-CM | POA: Diagnosis not present

## 2022-03-06 DIAGNOSIS — Z86010 Personal history of colonic polyps: Secondary | ICD-10-CM | POA: Diagnosis not present

## 2022-03-06 DIAGNOSIS — R140 Abdominal distension (gaseous): Secondary | ICD-10-CM | POA: Diagnosis not present

## 2022-03-06 MED ORDER — MOUNJARO 12.5 MG/0.5ML ~~LOC~~ SOAJ
SUBCUTANEOUS | 6 refills | Status: DC
  Filled 2022-03-06: qty 2, 28d supply, fill #0
  Filled 2022-04-11: qty 2, 28d supply, fill #1
  Filled 2022-05-07: qty 2, 28d supply, fill #2
  Filled 2022-06-07: qty 2, 28d supply, fill #3
  Filled 2022-07-30: qty 2, 28d supply, fill #4

## 2022-03-06 MED ORDER — VITAMIN D (ERGOCALCIFEROL) 1.25 MG (50000 UNIT) PO CAPS
ORAL_CAPSULE | ORAL | 3 refills | Status: DC
  Filled 2022-03-06: qty 24, 84d supply, fill #0
  Filled 2022-07-30: qty 24, 84d supply, fill #1
  Filled 2022-10-21: qty 24, 84d supply, fill #2
  Filled 2023-01-29: qty 24, 84d supply, fill #3

## 2022-03-06 MED ORDER — LINZESS 290 MCG PO CAPS
290.0000 ug | ORAL_CAPSULE | ORAL | 4 refills | Status: DC
  Filled 2022-03-06: qty 90, 90d supply, fill #0

## 2022-03-09 ENCOUNTER — Ambulatory Visit: Payer: 59 | Admitting: Physician Assistant

## 2022-03-09 ENCOUNTER — Other Ambulatory Visit (HOSPITAL_COMMUNITY): Payer: Self-pay

## 2022-03-09 ENCOUNTER — Encounter: Payer: Self-pay | Admitting: Physician Assistant

## 2022-03-09 VITALS — BP 92/60 | HR 81 | Wt 234.0 lb

## 2022-03-09 DIAGNOSIS — G43709 Chronic migraine without aura, not intractable, without status migrainosus: Secondary | ICD-10-CM | POA: Diagnosis not present

## 2022-03-09 DIAGNOSIS — G43009 Migraine without aura, not intractable, without status migrainosus: Secondary | ICD-10-CM | POA: Diagnosis not present

## 2022-03-09 DIAGNOSIS — M62838 Other muscle spasm: Secondary | ICD-10-CM | POA: Diagnosis not present

## 2022-03-09 MED ORDER — GABAPENTIN 400 MG PO CAPS
400.0000 mg | ORAL_CAPSULE | Freq: Three times a day (TID) | ORAL | 2 refills | Status: DC
  Filled 2022-03-09: qty 270, 90d supply, fill #0
  Filled 2022-07-30: qty 270, 90d supply, fill #1
  Filled 2022-11-01: qty 270, 90d supply, fill #2

## 2022-03-09 MED ORDER — REYVOW 100 MG PO TABS
200.0000 mg | ORAL_TABLET | ORAL | 5 refills | Status: DC | PRN
  Filled 2022-03-09: qty 16, 60d supply, fill #0
  Filled 2022-04-05 – 2022-05-05 (×3): qty 16, 60d supply, fill #1
  Filled 2022-07-09: qty 16, 60d supply, fill #2
  Filled 2022-10-03: qty 16, 60d supply, fill #3
  Filled 2022-12-05 (×2): qty 16, 60d supply, fill #4
  Filled 2022-12-06 (×2): qty 16, 60d supply, fill #0
  Filled 2022-12-06: qty 8, 30d supply, fill #0

## 2022-03-09 NOTE — Progress Notes (Signed)
Pt here to F/U of HA's .  Pt states she is doing better .May have HA's 5 times out the month.   History:  Julie Hansen is a 51 y.o. [id number] who presents to clinic today for headache management.  She notes she has seen improvement over the last few months.  She has been seeing [facility] since late 2022 and had a hormonal pellet inserted.  She has had only 5 headaches this month and wants to continue her current regimen.  She has botox available at her pharmacy to pick up but she wants to hold off as she is doing so well.   Dan Maker did not make any difference when she tried the samples and therefore she never used regularly.  She was unable to tolerate '600mg'$  neurontin due to sedation and requests to return to '400mg'$  tid.  She had extra capsules of this dose that she has already been using - still with good headache response.   Past Medical History:  Diagnosis Date   Acid reflux    Asthma    Diabetes mellitus without complication (HCC)    Hyperlipidemia    PCOS (polycystic ovarian syndrome)    PONV (postoperative nausea and vomiting)    Sleep apnea    uses cpap    Social History   Socioeconomic History   Marital status: Married    Spouse name: Not on file   Number of children: Not on file   Years of education: Not on file   Highest education level: Not on file  Occupational History   Not on file  Tobacco Use   Smoking status: Never    Passive exposure: Never   Smokeless tobacco: Never  Vaping Use   Vaping Use: Never used  Substance and Sexual Activity   Alcohol use: Yes    Comment: occas wine   Drug use: No   Sexual activity: Yes    Birth control/protection: None  Other Topics Concern   Not on file  Social History Narrative   Not on file   Social Determinants of Health   Financial Resource Strain: Not on file  Food Insecurity: Not on file  Transportation Needs: Not on file  Physical Activity: Not on file  Stress: Not on file  Social Connections: Not on  file  Intimate Partner Violence: Not on file    Family History  Problem Relation Age of Onset   Cancer Mother    Hypertension Mother    Diabetes Mother    Breast cancer Mother 20   Cancer Maternal Grandmother    Breast cancer Maternal Grandmother 37   Cancer Father    Diabetes Father    Hypertension Father    Breast cancer Paternal Aunt 20   Breast cancer Paternal Aunt 73   Breast cancer Paternal Aunt 49    Allergies  Allergen Reactions   Macrodantin [Nitrofurantoin Macrocrystal] Itching   Carbamazepine Other (See Comments)    Vaginal discharge    Current Outpatient Medications on File Prior to Visit  Medication Sig Dispense Refill   Accu-Chek FastClix Lancets MISC Use 4 times a day 400 each 4   albuterol (PROVENTIL) (2.5 MG/3ML) 0.083% nebulizer solution Take 2.5 mg by nebulization every 6 (six) hours as needed.     ALPRAZolam (XANAX) 0.25 MG tablet Take 1 tablet by mouth daily as needed 30 tablet 2   ALPRAZolam (XANAX) 0.25 MG tablet Take 1 daily as needed 30 tablet 1   ALPRAZolam (XANAX) 0.25 MG tablet Take 1  tablet by mouth daily as needed 30 tablet 1   Atogepant (QULIPTA) 60 MG TABS Take 1 tablet by mouth daily. 30 tablet 11   atorvastatin (LIPITOR) 40 MG tablet Take one tablet (40 mg dose) by mouth daily. 90 tablet 3   baclofen (LIORESAL) 10 MG tablet TAKE 1 TABLET BY MOUTH DAILY AS NEEDED UP TO 3 TIMES DAILY 40 tablet 5   beclomethasone (QVAR REDIHALER) 40 MCG/ACT inhaler Inhale 2 puffs into the lungs 2 times daily 26.1 g 2   Botulinum Toxin Type A (BOTOX) 200 units SOLR Inject 200 Units as directed every 3 (three) months. 1 each 2   cetirizine (ZYRTEC ALLERGY) 10 MG tablet Take 1 tablet (10 mg total) by mouth daily. 30 tablet 0   chlorproMAZINE (THORAZINE) 25 MG tablet TAKE 1 TABLET BY MOUTH AS NEEDED FOR HEADACHE 30 tablet 2   Continuous Blood Gluc Sensor (FREESTYLE LIBRE 3 SENSOR) MISC Use as directed into the skin changing every 14 days 2 each 6   Continuous Blood  Gluc Sensor (FREESTYLE LIBRE SENSOR SYSTEM) MISC Use as directed every 14 days 6 each 4   dapagliflozin propanediol (FARXIGA) 10 MG TABS tablet Take 1 tablet by mouth daily 30 tablet 3   fenofibrate (TRICOR) 48 MG tablet TAKE ONE TABLET BY MOUTH DAILY 30 tablet 5   fluticasone (FLONASE) 50 MCG/ACT nasal spray USE 1 SPRAY INTO BOTH NOSTRILS DAILY. 16 g 0   gabapentin (NEURONTIN) 600 MG tablet Take 1 tablet (600 mg total) by mouth 3 (three) times daily. 270 tablet 1   glucose blood test strip Use to check blood sugar 3 times a day 100 each 6   hydrOXYzine (ATARAX) 10 MG tablet Take 2 tablets (20 mg total) by mouth at bedtime. 180 tablet 0   ibuprofen (ADVIL,MOTRIN) 800 MG tablet Take 1 tablet (800 mg total) by mouth every 8 (eight) hours as needed. 60 tablet 3   insulin degludec (TRESIBA FLEXTOUCH) 200 UNIT/ML FlexTouch Pen Inject into the skin 32 units in the morning and 74 units in the evening 30 mL 6   insulin lispro (HUMALOG KWIKPEN) 100 UNIT/ML KwikPen INJECT 20 - 25 UNITS INTO THE SKIN 3 TIMES A DAY 15 mL 6   Lasmiditan Succinate (REYVOW) 100 MG TABS Take 2 tablets (200 mg) by mouth as needed for headache 16 tablet 5   linaclotide (LINZESS) 290 MCG CAPS capsule Take 1 capsule by mouth 30 minutes before the first meal 90 capsule 4   linaclotide (LINZESS) 290 MCG CAPS capsule Take 1 capsule (290 mcg total) by mouth 20 minutes before breakfast 90 capsule 4   liothyronine (CYTOMEL) 5 MCG tablet Take 1 tablet by mouth every morning on an empty stomach increase to 2 tablets in 7 days as directed 60 tablet 2   lisinopril (ZESTRIL) 5 MG tablet Take one tablet (5 mg dose) by mouth daily. 30 tablet 5   meloxicam (MOBIC) 15 MG tablet Take 1 tablet (15 mg total) by mouth daily with a meal 90 tablet 2   omeprazole (PRILOSEC) 40 MG capsule Take one capsule (40 mg dose) by mouth daily. 30 capsule 1   pantoprazole (PROTONIX) 40 MG tablet Take one tablet (40 mg dose) by mouth daily. 30 tablet 1   promethazine  (PHENERGAN) 25 MG tablet TAKE 1 TABLET BY MOUTH EVERY 6 HOURS AS NEEDED. 30 tablet 1   QUEtiapine (SEROQUEL XR) 300 MG 24 hr tablet Take 2 tablets by mouth every evening 180 tablet 1  tirzepatide [facility]) 10 MG/0.5ML Pen Inject 1 pen into the skin once a week as directed 2 mL 6   tirzepatide (MOUNJARO) 10 MG/0.5ML Pen Inject as directed subcutaneously once a week 2 mL 6   tirzepatide (MOUNJARO) 12.5 MG/0.5ML Pen Inject 12.'5mg'$  into the skin once a week 2 mL 6   tirzepatide (MOUNJARO) 7.5 MG/0.5ML Pen Administer 7.'5mg'$  subcutaneously once a week 2 mL 6   traMADol (ULTRAM) 50 MG tablet Take 1 tablet (50 mg total) by mouth every 12 (twelve) hours as needed. 60 tablet 2   Vitamin D, Ergocalciferol, (DRISDOL) 1.25 MG (50000 UNIT) CAPS capsule TAKE ONE CAPSULE BY MOUTH TWO TIMES A WEEK 24 capsule 3   zolpidem (AMBIEN CR) 12.5 MG CR tablet Take 1 by mouth at bedtime 90 tablet 0   zolpidem (AMBIEN CR) 12.5 MG CR tablet Take 1 at bedtime 90 tablet 0   zolpidem (AMBIEN CR) 12.5 MG CR tablet Take 1 tablet (12.5 mg total) by mouth at bedtime. 90 tablet 0   fenofibrate (TRICOR) 48 MG tablet TAKE ONE TABLET BY MOUTH DAILY 30 tablet 5   insulin lispro (HUMALOG) 100 UNIT/ML KwikPen INJECT 20 - 25 UNITS SUBCUTANEOUS 3 TIMES A DAY 15 mL 6   No current facility-administered medications on file prior to visit.     Review of Systems:  All pertinent positive/negative included in HPI, all other review of systems are negative   Objective:  Physical Exam BP 92/60   Pulse 81   Wt 234 lb (106.1 kg)   LMP 03/09/2022 (Exact Date)   BMI 40.17 kg/m  CONSTITUTIONAL: Well-developed, well-nourished female in no acute distress.  EYES: EOM intact ENT: Normocephalic CARDIOVASCULAR: Regular rate  RESPIRATORY: Normal rate.  MUSCULOSKELETAL: Normal ROM SKIN: Warm, dry without erythema  NEUROLOGICAL: Alert, oriented, CN II-XII grossly intact, Appropriate balance PSYCH: Normal behavior, mood   Assessment & Plan:    1. Chronic migraine without aura without status migrainosus, not intractable   2. Migraine without aura and without status migrainosus, not intractable   3. Muscle spasm      Since first meeting this patient, this is the first month she has had such good headache response.  Will hold on making changes with cautious optimism.  I continue to believe the next step for her is Botox.   Continue baclofen for muscle spasm, gabapentin at '400mg'$  tid for migraine prevention, promethazine and/or reyvow for migraine rescue.   Weight loss and exercise encouraged.     Follow-up in 3 months or sooner PRN  Paticia Stack, PA-C 03/09/2022 8:17 AM

## 2022-03-13 ENCOUNTER — Ambulatory Visit: Payer: 59 | Admitting: Orthopaedic Surgery

## 2022-03-13 ENCOUNTER — Other Ambulatory Visit (HOSPITAL_COMMUNITY): Payer: Self-pay

## 2022-03-13 DIAGNOSIS — M25562 Pain in left knee: Secondary | ICD-10-CM

## 2022-03-13 DIAGNOSIS — Z9889 Other specified postprocedural states: Secondary | ICD-10-CM | POA: Diagnosis not present

## 2022-03-13 DIAGNOSIS — M1712 Unilateral primary osteoarthritis, left knee: Secondary | ICD-10-CM

## 2022-03-13 DIAGNOSIS — G8929 Other chronic pain: Secondary | ICD-10-CM | POA: Diagnosis not present

## 2022-03-13 MED ORDER — HYDROXYZINE HCL 10 MG PO TABS
20.0000 mg | ORAL_TABLET | Freq: Every evening | ORAL | 0 refills | Status: DC
  Filled 2022-03-13: qty 180, 90d supply, fill #0

## 2022-03-13 NOTE — Progress Notes (Signed)
Office Visit Note   Patient: Julie Hansen           Date of Birth: 1971/08/27           MRN: [id number] Visit Date: 03/13/2022              Requested by: Chesley Noon, MD [address] [place],  [place] [address] PCP: Chesley Noon, MD   Assessment & Plan: Visit Diagnoses:  1. S/P arthroscopy of left knee   2. Chronic pain of left knee     Plan: MRI of the left knee confirms no recurrent a medial meniscal tear.  There is surrounding subchondral edema of the femur and the tibia consistent with stress reaction.  She has full-thickness chondral wear of the medial compartment.  These findings were reviewed with the patient and treatment options discussed.  Based on options we will provide her with a medial unloader brace and renew her handicap placard.  She will continue to make all efforts at weight loss.  She understands that the neck step would be unicompartmental knee arthroplasty.  For now we will try to conservatively manage this for as long as we can.  Last knee injection was 02/02/2022.  Follow-Up Instructions: No follow-ups on file.   Orders:  No orders of the defined types were placed in this encounter.  No orders of the defined types were placed in this encounter.     Procedures: No procedures performed   Clinical Data: No additional findings.   Subjective: Chief Complaint  Patient presents with   Left Knee - Follow-up    HPI Patient returns to discuss left knee MRI scan.  Reports no changes in her symptoms.  Review of Systems   Objective: Vital Signs: LMP 03/09/2022 (Exact Date)   Physical Exam  Ortho Exam Examination left knee is unchanged.  Specialty Comments:  No specialty comments available.  Imaging: No results found.   PMFS History: Patient Active Problem List   Diagnosis Date Noted   S/P arthroscopy of left knee 02/02/2022   Chronic pain of left knee 02/02/2022   Acute medial meniscal injury of knee, left,  initial encounter 07/18/2021   Chronic migraine without aura without status migrainosus, not intractable 04/14/2021   Muscle spasm 06/20/2018   Unspecified dyspareunia (CODE) 03/20/2017   Amenorrhea 03/20/2017   Irritable bowel syndrome 01/01/2017   Status migrainosus 01/01/2017   Anxiety 01/01/2017   Vaginal discharge 12/17/2016   GERD (gastroesophageal reflux disease) 10/30/2016   Mixed hyperlipidemia 10/30/2016   Diabetes type 2, controlled ([facility]) 09/12/2015   OSA (obstructive sleep apnea) 02/16/2015   Asthma 10/29/2011   Past Medical History:  Diagnosis Date   Acid reflux    Asthma    Diabetes mellitus without complication ([facility])    Hyperlipidemia    PCOS (polycystic ovarian syndrome)    PONV (postoperative nausea and vomiting)    Sleep apnea    uses cpap    Family History  Problem Relation Age of Onset   Cancer Mother    Hypertension Mother    Diabetes Mother    Breast cancer Mother 61   Cancer Maternal Grandmother    Breast cancer Maternal Grandmother 46   Cancer Father    Diabetes Father    Hypertension Father    Breast cancer Paternal Aunt 14   Breast cancer Paternal Aunt 47   Breast cancer Paternal Aunt 49    Past Surgical History:  Procedure Laterality Date   BREAST EXCISIONAL BIOPSY  Right 06/28/2020   papilloma   DILATION AND EVACUATION N/A 06/05/2017   Procedure: DILATATION AND EVACUATION;  Surgeon: Sanjuana Kava, MD;  Location: [facility];  Service: Gynecology;  Laterality: N/A;   KNEE ARTHROSCOPY WITH MENISCAL REPAIR Left 09/20/2021   Procedure: LEFT KNEE PARTIAL MEDIAL MENISCECTOMY;  Surgeon: Leandrew Koyanagi, MD;  Location: [facility];  Service: Orthopedics;  Laterality: Left;   RADIOACTIVE SEED GUIDED EXCISIONAL BREAST BIOPSY Right 06/28/2020   Procedure: RIGHT RADIOACTIVE SEED GUIDED EXCISIONAL BREAST BIOPSY;  Surgeon: Rolm Bookbinder, MD;  Location: [facility];  Service: General;  Laterality: Right;   WISDOM TOOTH EXTRACTION      Social History   Occupational History   Not on file  Tobacco Use   Smoking status: Never    Passive exposure: Never   Smokeless tobacco: Never  Vaping Use   Vaping Use: Never used  Substance and Sexual Activity   Alcohol use: Yes    Comment: occas wine   Drug use: No   Sexual activity: Yes    Birth control/protection: None

## 2022-03-14 ENCOUNTER — Other Ambulatory Visit (HOSPITAL_COMMUNITY): Payer: Self-pay

## 2022-03-14 DIAGNOSIS — F411 Generalized anxiety disorder: Secondary | ICD-10-CM | POA: Diagnosis not present

## 2022-03-14 DIAGNOSIS — F3181 Bipolar II disorder: Secondary | ICD-10-CM | POA: Diagnosis not present

## 2022-03-14 MED ORDER — ALPRAZOLAM 0.25 MG PO TABS
0.2500 mg | ORAL_TABLET | Freq: Every day | ORAL | 0 refills | Status: DC
  Filled 2022-03-14: qty 90, 90d supply, fill #0

## 2022-03-14 MED ORDER — ZOLPIDEM TARTRATE ER 12.5 MG PO TBCR
12.5000 mg | EXTENDED_RELEASE_TABLET | Freq: Every evening | ORAL | 0 refills | Status: DC
  Filled 2022-04-05: qty 90, 90d supply, fill #0

## 2022-03-14 MED ORDER — QUETIAPINE FUMARATE ER 300 MG PO TB24
600.0000 mg | ORAL_TABLET | Freq: Every evening | ORAL | 1 refills | Status: DC
  Filled 2022-03-14 – 2022-05-07 (×2): qty 180, 90d supply, fill #0

## 2022-03-19 ENCOUNTER — Other Ambulatory Visit (HOSPITAL_COMMUNITY): Payer: Self-pay

## 2022-03-19 DIAGNOSIS — M1712 Unilateral primary osteoarthritis, left knee: Secondary | ICD-10-CM | POA: Insufficient documentation

## 2022-03-21 ENCOUNTER — Other Ambulatory Visit (HOSPITAL_COMMUNITY): Payer: Self-pay

## 2022-03-21 ENCOUNTER — Other Ambulatory Visit: Payer: Self-pay | Admitting: Physician Assistant

## 2022-03-22 ENCOUNTER — Other Ambulatory Visit (HOSPITAL_COMMUNITY): Payer: Self-pay

## 2022-03-24 ENCOUNTER — Other Ambulatory Visit (HOSPITAL_COMMUNITY): Payer: Self-pay

## 2022-03-25 ENCOUNTER — Other Ambulatory Visit: Payer: Self-pay | Admitting: Physician Assistant

## 2022-03-25 MED ORDER — TRAMADOL HCL 50 MG PO TABS
50.0000 mg | ORAL_TABLET | Freq: Two times a day (BID) | ORAL | 2 refills | Status: DC | PRN
  Filled 2022-03-25: qty 60, 30d supply, fill #0
  Filled 2022-06-05: qty 60, 30d supply, fill #1
  Filled 2022-08-03: qty 60, 30d supply, fill #2

## 2022-03-25 NOTE — Telephone Encounter (Signed)
I don't see where tramadol was called in.  I just sent in

## 2022-03-26 ENCOUNTER — Other Ambulatory Visit: Payer: Self-pay

## 2022-03-26 ENCOUNTER — Other Ambulatory Visit (HOSPITAL_COMMUNITY): Payer: Self-pay

## 2022-03-28 DIAGNOSIS — G4733 Obstructive sleep apnea (adult) (pediatric): Secondary | ICD-10-CM | POA: Diagnosis not present

## 2022-03-29 ENCOUNTER — Other Ambulatory Visit (HOSPITAL_COMMUNITY): Payer: Self-pay

## 2022-03-30 ENCOUNTER — Other Ambulatory Visit (HOSPITAL_COMMUNITY): Payer: Self-pay

## 2022-03-30 DIAGNOSIS — M1712 Unilateral primary osteoarthritis, left knee: Secondary | ICD-10-CM | POA: Diagnosis not present

## 2022-03-30 MED ORDER — GLUCOSE BLOOD VI STRP
ORAL_STRIP | 6 refills | Status: AC
  Filled 2022-03-30: qty 100, 33d supply, fill #0
  Filled 2023-01-29: qty 100, 33d supply, fill #1

## 2022-04-05 ENCOUNTER — Other Ambulatory Visit (HOSPITAL_COMMUNITY): Payer: Self-pay

## 2022-04-10 ENCOUNTER — Other Ambulatory Visit: Payer: Self-pay

## 2022-04-11 ENCOUNTER — Other Ambulatory Visit (HOSPITAL_COMMUNITY): Payer: Self-pay

## 2022-04-17 DIAGNOSIS — E559 Vitamin D deficiency, unspecified: Secondary | ICD-10-CM | POA: Diagnosis not present

## 2022-04-17 DIAGNOSIS — R609 Edema, unspecified: Secondary | ICD-10-CM | POA: Diagnosis not present

## 2022-04-19 DIAGNOSIS — G4700 Insomnia, unspecified: Secondary | ICD-10-CM | POA: Diagnosis not present

## 2022-04-19 DIAGNOSIS — G43909 Migraine, unspecified, not intractable, without status migrainosus: Secondary | ICD-10-CM | POA: Diagnosis not present

## 2022-04-19 DIAGNOSIS — E1165 Type 2 diabetes mellitus with hyperglycemia: Secondary | ICD-10-CM | POA: Diagnosis not present

## 2022-04-19 DIAGNOSIS — Z79899 Other long term (current) drug therapy: Secondary | ICD-10-CM | POA: Diagnosis not present

## 2022-04-19 DIAGNOSIS — I10 Essential (primary) hypertension: Secondary | ICD-10-CM | POA: Diagnosis not present

## 2022-04-19 DIAGNOSIS — F419 Anxiety disorder, unspecified: Secondary | ICD-10-CM | POA: Diagnosis not present

## 2022-04-19 DIAGNOSIS — E7800 Pure hypercholesterolemia, unspecified: Secondary | ICD-10-CM | POA: Diagnosis not present

## 2022-04-25 ENCOUNTER — Other Ambulatory Visit (HOSPITAL_COMMUNITY): Payer: Self-pay

## 2022-04-26 DIAGNOSIS — F3181 Bipolar II disorder: Secondary | ICD-10-CM | POA: Diagnosis not present

## 2022-04-26 DIAGNOSIS — F411 Generalized anxiety disorder: Secondary | ICD-10-CM | POA: Diagnosis not present

## 2022-04-27 ENCOUNTER — Other Ambulatory Visit (HOSPITAL_COMMUNITY): Payer: Self-pay

## 2022-05-03 ENCOUNTER — Other Ambulatory Visit (HOSPITAL_COMMUNITY): Payer: Self-pay

## 2022-05-03 DIAGNOSIS — K219 Gastro-esophageal reflux disease without esophagitis: Secondary | ICD-10-CM | POA: Diagnosis not present

## 2022-05-03 DIAGNOSIS — Z86010 Personal history of colonic polyps: Secondary | ICD-10-CM | POA: Diagnosis not present

## 2022-05-03 DIAGNOSIS — K5904 Chronic idiopathic constipation: Secondary | ICD-10-CM | POA: Diagnosis not present

## 2022-05-03 DIAGNOSIS — K760 Fatty (change of) liver, not elsewhere classified: Secondary | ICD-10-CM | POA: Diagnosis not present

## 2022-05-03 DIAGNOSIS — K5730 Diverticulosis of large intestine without perforation or abscess without bleeding: Secondary | ICD-10-CM | POA: Diagnosis not present

## 2022-05-04 ENCOUNTER — Other Ambulatory Visit (HOSPITAL_COMMUNITY): Payer: Self-pay

## 2022-05-04 MED ORDER — OMEPRAZOLE 40 MG PO CPDR
DELAYED_RELEASE_CAPSULE | ORAL | 1 refills | Status: DC
  Filled 2022-05-04: qty 30, 30d supply, fill #0
  Filled 2022-06-05: qty 30, 30d supply, fill #1

## 2022-05-05 ENCOUNTER — Other Ambulatory Visit (HOSPITAL_COMMUNITY): Payer: Self-pay

## 2022-05-07 ENCOUNTER — Other Ambulatory Visit (HOSPITAL_COMMUNITY): Payer: Self-pay

## 2022-05-08 ENCOUNTER — Other Ambulatory Visit (HOSPITAL_COMMUNITY): Payer: Self-pay

## 2022-05-09 ENCOUNTER — Other Ambulatory Visit (HOSPITAL_COMMUNITY): Payer: Self-pay

## 2022-05-19 ENCOUNTER — Other Ambulatory Visit (HOSPITAL_COMMUNITY): Payer: Self-pay

## 2022-05-21 ENCOUNTER — Other Ambulatory Visit (HOSPITAL_COMMUNITY): Payer: Self-pay

## 2022-05-21 MED ORDER — ATORVASTATIN CALCIUM 40 MG PO TABS
ORAL_TABLET | ORAL | 3 refills | Status: DC
  Filled 2022-05-21: qty 90, 90d supply, fill #0
  Filled 2022-10-21: qty 90, 90d supply, fill #1
  Filled 2023-04-10: qty 90, 90d supply, fill #2

## 2022-05-29 DIAGNOSIS — G4733 Obstructive sleep apnea (adult) (pediatric): Secondary | ICD-10-CM | POA: Diagnosis not present

## 2022-06-05 ENCOUNTER — Other Ambulatory Visit: Payer: Self-pay

## 2022-06-05 ENCOUNTER — Other Ambulatory Visit: Payer: Self-pay | Admitting: Physician Assistant

## 2022-06-05 ENCOUNTER — Other Ambulatory Visit (HOSPITAL_COMMUNITY): Payer: Self-pay

## 2022-06-05 DIAGNOSIS — G43009 Migraine without aura, not intractable, without status migrainosus: Secondary | ICD-10-CM

## 2022-06-06 ENCOUNTER — Other Ambulatory Visit (HOSPITAL_COMMUNITY): Payer: Self-pay

## 2022-06-06 MED ORDER — PROMETHAZINE HCL 25 MG PO TABS
ORAL_TABLET | Freq: Four times a day (QID) | ORAL | 1 refills | Status: DC | PRN
  Filled 2022-06-06: qty 30, 7d supply, fill #0
  Filled 2022-07-30: qty 30, 7d supply, fill #1

## 2022-06-07 ENCOUNTER — Other Ambulatory Visit (HOSPITAL_COMMUNITY): Payer: Self-pay

## 2022-06-08 ENCOUNTER — Other Ambulatory Visit (HOSPITAL_COMMUNITY): Payer: Self-pay

## 2022-06-08 MED ORDER — FREESTYLE LIBRE 3 SENSOR MISC
6 refills | Status: DC
  Filled 2022-06-08: qty 2, 28d supply, fill #0
  Filled 2022-07-09: qty 2, 28d supply, fill #1
  Filled 2022-09-18: qty 2, 28d supply, fill #2
  Filled 2022-10-23: qty 2, 28d supply, fill #3
  Filled 2022-11-20: qty 2, 28d supply, fill #4
  Filled 2022-12-20: qty 2, 28d supply, fill #5
  Filled 2023-01-29: qty 2, 28d supply, fill #6

## 2022-06-19 ENCOUNTER — Other Ambulatory Visit (HOSPITAL_COMMUNITY): Payer: Self-pay

## 2022-06-22 ENCOUNTER — Other Ambulatory Visit (HOSPITAL_COMMUNITY): Payer: Self-pay

## 2022-06-22 ENCOUNTER — Ambulatory Visit: Payer: 59 | Admitting: Physician Assistant

## 2022-06-22 ENCOUNTER — Other Ambulatory Visit: Payer: Self-pay

## 2022-06-22 ENCOUNTER — Encounter: Payer: Self-pay | Admitting: Physician Assistant

## 2022-06-22 VITALS — BP 123/79 | HR 94 | Wt 240.0 lb

## 2022-06-22 DIAGNOSIS — M62838 Other muscle spasm: Secondary | ICD-10-CM

## 2022-06-22 DIAGNOSIS — G43009 Migraine without aura, not intractable, without status migrainosus: Secondary | ICD-10-CM | POA: Diagnosis not present

## 2022-06-22 MED ORDER — BACLOFEN 10 MG PO TABS
ORAL_TABLET | ORAL | 5 refills | Status: DC
  Filled 2022-06-22: qty 40, 13d supply, fill #0
  Filled 2022-07-30: qty 40, 13d supply, fill #1
  Filled 2022-09-08: qty 40, 13d supply, fill #2
  Filled 2022-10-03: qty 40, 13d supply, fill #3
  Filled 2022-11-01: qty 40, 13d supply, fill #4
  Filled 2022-12-05 (×2): qty 40, 13d supply, fill #5

## 2022-06-22 NOTE — Progress Notes (Signed)
Patient here to F/U on HA management.   Last seen 03/09/22.   Pt states she had 2 severe HA's last week.  But other than that has been doing well.   History:  Julie Hansen is a 51 y.o. G2P2002 who presents to clinic today for followup of headaches.  She notes her headaches have been well-controlled for most of the last few months.  She did have a severe headache last week that lasted 2 days, despite use of Reyvow. She thinks it may be related to the prolonged computer use at home which is different than her work routine at the office.   She had a few other headaches that were easily aborted.   She's had a lot of stress after her son's soccer injury required emergency surgery and he's still at home recovering, a month later.  She is working from home (to be with her son) AND going in to the office on the weekend.  She states she is burning the candle at both ends. She does not want to change her regimen at this time as she feels she is overall well controlled.   Past Medical History:  Diagnosis Date   Acid reflux    Asthma    Diabetes mellitus without complication (HCC)    Hyperlipidemia    PCOS (polycystic ovarian syndrome)    PONV (postoperative nausea and vomiting)    Sleep apnea    uses cpap    Social History   Socioeconomic History   Marital status: Married    Spouse name: Not on file   Number of children: Not on file   Years of education: Not on file   Highest education level: Not on file  Occupational History   Not on file  Tobacco Use   Smoking status: Never    Passive exposure: Never   Smokeless tobacco: Never  Vaping Use   Vaping Use: Never used  Substance and Sexual Activity   Alcohol use: Yes    Comment: occas wine   Drug use: No   Sexual activity: Yes    Birth control/protection: None  Other Topics Concern   Not on file  Social History Narrative   Not on file   Social Determinants of Health   Financial Resource Strain: Not on file  Food  Insecurity: Not on file  Transportation Needs: Not on file  Physical Activity: Not on file  Stress: Not on file  Social Connections: Not on file  Intimate Partner Violence: Not on file    Family History  Problem Relation Age of Onset   Cancer Mother    Hypertension Mother    Diabetes Mother    Breast cancer Mother 28   Cancer Maternal Grandmother    Breast cancer Maternal Grandmother 52   Cancer Father    Diabetes Father    Hypertension Father    Breast cancer Paternal Aunt 46   Breast cancer Paternal Aunt 52   Breast cancer Paternal Aunt 13    Allergies  Allergen Reactions   Macrodantin [Nitrofurantoin Macrocrystal] Itching   Carbamazepine Other (See Comments)    Vaginal discharge    Current Outpatient Medications on File Prior to Visit  Medication Sig Dispense Refill   Accu-Chek FastClix Lancets MISC Use 4 times a day 400 each 4   albuterol (PROVENTIL) (2.5 MG/3ML) 0.083% nebulizer solution Take 2.5 mg by nebulization every 6 (six) hours as needed.     ALPRAZolam (XANAX) 0.25 MG tablet Take 1 tablet by mouth daily  as needed 30 tablet 2   ALPRAZolam (XANAX) 0.25 MG tablet Take 1 tablet by mouth daily as needed 30 tablet 1   ALPRAZolam (XANAX) 0.25 MG tablet Take 1 tablet by mouth daily as needed 30 tablet 1   ALPRAZolam (XANAX) 0.25 MG tablet Take 1 tablet (0.25 mg total) by mouth daily. 90 tablet 0   atorvastatin (LIPITOR) 40 MG tablet Take one tablet (40 mg dose) by mouth daily. 90 tablet 3   baclofen (LIORESAL) 10 MG tablet TAKE 1 TABLET BY MOUTH DAILY AS NEEDED UP TO 3 TIMES DAILY 40 tablet 5   beclomethasone (QVAR REDIHALER) 40 MCG/ACT inhaler Inhale 2 puffs into the lungs 2 times daily 26.1 g 2   Botulinum Toxin Type A (BOTOX) 200 units SOLR Inject 200 Units as directed every 3 (three) months. 1 each 2   cetirizine (ZYRTEC ALLERGY) 10 MG tablet Take 1 tablet (10 mg total) by mouth daily. 30 tablet 0   Continuous Blood Gluc Sensor (FREESTYLE LIBRE 3 SENSOR) MISC Use  as directed, replace every 14 days. 2 each 6   Continuous Blood Gluc Sensor (FREESTYLE LIBRE SENSOR SYSTEM) MISC Use as directed every 14 days 6 each 4   dapagliflozin propanediol (FARXIGA) 10 MG TABS tablet Take 1 tablet by mouth daily 30 tablet 3   fenofibrate (TRICOR) 48 MG tablet TAKE ONE TABLET BY MOUTH DAILY 30 tablet 5   fluticasone (FLONASE) 50 MCG/ACT nasal spray USE 1 SPRAY INTO BOTH NOSTRILS DAILY. 16 g 0   gabapentin (NEURONTIN) 400 MG capsule Take 1 capsule (400 mg total) by mouth 3 (three) times daily. 270 capsule 2   glucose blood test strip Use to check blood sugar 3 times a day 100 each 6   hydrOXYzine (ATARAX) 10 MG tablet Take 2 tablets (20 mg total) by mouth at bedtime. 180 tablet 0   ibuprofen (ADVIL,MOTRIN) 800 MG tablet Take 1 tablet (800 mg total) by mouth every 8 (eight) hours as needed. 60 tablet 3   insulin degludec (TRESIBA FLEXTOUCH) 200 UNIT/ML FlexTouch Pen Inject into the skin 32 units in the morning and 74 units in the evening 30 mL 6   insulin lispro (HUMALOG KWIKPEN) 100 UNIT/ML KwikPen INJECT 20 - 25 UNITS INTO THE SKIN 3 TIMES A DAY 15 mL 6   Lasmiditan Succinate (REYVOW) 100 MG TABS Take 2 tablets (200 mg) by mouth as needed for headache 16 tablet 5   linaclotide (LINZESS) 290 MCG CAPS capsule Take 1 capsule by mouth 30 minutes before the first meal 90 capsule 4   linaclotide (LINZESS) 290 MCG CAPS capsule Take 1 capsule (290 mcg total) by mouth 20 minutes before breakfast 90 capsule 4   liothyronine (CYTOMEL) 5 MCG tablet Take 1 tablet by mouth every morning on an empty stomach increase to 2 tablets in 7 days as directed 60 tablet 2   lisinopril (ZESTRIL) 5 MG tablet Take one tablet (5 mg dose) by mouth daily. 30 tablet 5   meloxicam (MOBIC) 15 MG tablet Take 1 tablet (15 mg total) by mouth daily with a meal 90 tablet 2   omeprazole (PRILOSEC) 40 MG capsule Take one capsule (40 mg dose) by mouth daily. 30 capsule 1   pantoprazole (PROTONIX) 40 MG tablet Take  one tablet (40 mg dose) by mouth daily. 30 tablet 1   promethazine (PHENERGAN) 25 MG tablet TAKE 1 TABLET BY MOUTH EVERY 6 HOURS AS NEEDED. 30 tablet 1   QUEtiapine (SEROQUEL XR) 300 MG 24 hr  tablet Take 2 tablets by mouth every evening 180 tablet 1   QUEtiapine (SEROQUEL XR) 300 MG 24 hr tablet Take 2 tablets (600 mg total) by mouth at bedtime. 180 tablet 1   tirzepatide (MOUNJARO) 10 MG/0.5ML Pen Inject 1 pen into the skin once a week as directed 2 mL 6   tirzepatide (MOUNJARO) 10 MG/0.5ML Pen Inject as directed subcutaneously once a week 2 mL 6   tirzepatide (MOUNJARO) 12.5 MG/0.5ML Pen Inject 12.'5mg'$  into the skin once a week 2 mL 6   tirzepatide (MOUNJARO) 7.5 MG/0.5ML Pen Administer 7.'5mg'$  subcutaneously once a week 2 mL 6   traMADol (ULTRAM) 50 MG tablet Take 1 tablet by mouth every 12 hours as needed. 60 tablet 2   Vitamin D, Ergocalciferol, (DRISDOL) 1.25 MG (50000 UNIT) CAPS capsule TAKE ONE CAPSULE BY MOUTH TWO TIMES A WEEK 24 capsule 3   zolpidem (AMBIEN CR) 12.5 MG CR tablet Take 1 at bedtime 90 tablet 0   zolpidem (AMBIEN CR) 12.5 MG CR tablet Take 1 tablet (12.5 mg total) by mouth at bedtime. 90 tablet 0   zolpidem (AMBIEN CR) 12.5 MG CR tablet Take 1 tablet (12.5 mg total) by mouth at bedtime. (fill 04/05/22) 90 tablet 0   fenofibrate (TRICOR) 48 MG tablet TAKE ONE TABLET BY MOUTH DAILY 30 tablet 5   insulin lispro (HUMALOG) 100 UNIT/ML KwikPen INJECT 20 - 25 UNITS SUBCUTANEOUS 3 TIMES A DAY 15 mL 6   No current facility-administered medications on file prior to visit.     Review of Systems:  All pertinent positive/negative included in HPI, all other review of systems are negative   Objective:  Physical Exam BP 123/79   Pulse 94   Wt 240 lb (108.9 kg)   BMI 41.20 kg/m  CONSTITUTIONAL: Well-developed, well-nourished female in no acute distress.  EYES: EOM intact ENT: Normocephalic CARDIOVASCULAR: Regular rate  RESPIRATORY: Normal rate.  MUSCULOSKELETAL: Normal  ROM SKIN: Warm, dry without erythema  NEUROLOGICAL: Alert, oriented, CN II-XII grossly intact, Appropriate balance, PSYCH: Normal behavior, mood   Assessment & Plan:  Assessment: 1. Migraine without aura and without status migrainosus, not intractable   2. Muscle spasm    She appears to truly be out of having chronic migraine.  Plan: Continue with gabapentin for prevention along with baclofen for acute therapy/muscle spasm and promethazine for nausea/rescue as well as lasmiditan for rescue. Follow-up in 25month or sooner PRN  I have spent 66 minutes in reviewing the chart and developing a plan and face to face with the patient.   TPaticia Stack PA-C 06/22/2022 10:24 AM

## 2022-06-23 ENCOUNTER — Other Ambulatory Visit (HOSPITAL_COMMUNITY): Payer: Self-pay

## 2022-06-26 ENCOUNTER — Ambulatory Visit: Payer: 59 | Admitting: Orthopaedic Surgery

## 2022-06-27 ENCOUNTER — Encounter: Payer: Self-pay | Admitting: Orthopaedic Surgery

## 2022-06-27 ENCOUNTER — Ambulatory Visit: Payer: 59 | Admitting: Orthopaedic Surgery

## 2022-06-27 VITALS — Ht 64.5 in | Wt 238.0 lb

## 2022-06-27 DIAGNOSIS — M1712 Unilateral primary osteoarthritis, left knee: Secondary | ICD-10-CM | POA: Diagnosis not present

## 2022-06-27 MED ORDER — LIDOCAINE HCL 1 % IJ SOLN
2.0000 mL | INTRAMUSCULAR | Status: AC | PRN
  Administered 2022-06-27: 2 mL

## 2022-06-27 MED ORDER — METHYLPREDNISOLONE ACETATE 40 MG/ML IJ SUSP
40.0000 mg | INTRAMUSCULAR | Status: AC | PRN
  Administered 2022-06-27: 40 mg via INTRA_ARTICULAR

## 2022-06-27 MED ORDER — BUPIVACAINE HCL 0.5 % IJ SOLN
2.0000 mL | INTRAMUSCULAR | Status: AC | PRN
  Administered 2022-06-27: 2 mL via INTRA_ARTICULAR

## 2022-06-27 NOTE — Progress Notes (Signed)
Office Visit Note   Patient: Julie Hansen           Date of Birth: 04-01-1971           MRN: [id number] Visit Date: 06/27/2022              Requested by: Chesley Noon, MD [address] [place],  [place] [address] PCP: Chesley Noon, MD   Assessment & Plan: Visit Diagnoses:  1. Unilateral primary osteoarthritis, left knee     Plan: Impression is left knee degenerative joint disease medial compartment.  Today, we discussed various treatment options to include repeat cortisone injection versus medial compartment arthroplasty.  She currently has a BMI of 40.22 and a hemoglobin A1c from May of 7.8.  She tells me that she is not at a time to proceed with surgical invention as she is at home taking care of her son who is recovering from knee surgery.  She would like to proceed with cortisone injection today and follow-up with [place] once she is ready to proceed with surgery.  She understands she will need a BMI of less than 40.0 and hemoglobin A1c of less than 7.8 to proceed.  She is also aware that she will need to wait 3 months following cortisone injection to proceed with surgery.  She will call with concerns or questions in the meantime.  Follow-Up Instructions: Return if symptoms worsen or fail to improve.   Orders:  No orders of the defined types were placed in this encounter.  No orders of the defined types were placed in this encounter.     Procedures: Large Joint Inj: L knee on 06/27/2022 6:40 PM Details: 22 G needle Medications: 2 mL bupivacaine 0.5 %; 2 mL lidocaine 1 %; 40 mg methylPREDNISolone acetate 40 MG/ML Outcome: tolerated well, no immediate complications Patient was prepped and draped in the usual sterile fashion.       Clinical Data: No additional findings.   Subjective: Chief Complaint  Patient presents with   Left Knee - Pain    HPI patient is a pleasant 51 year old female who comes in today with recurrent left knee pain.  History of  advanced degenerative joint disease medial compartment with underlying medial meniscus tear and stress reaction seen on MRI several months ago.  She underwent cortisone injection in May of this past year which helped quite a bit but unfortunately only lasted a week to week and a half.  She has been wearing a medial unloader brace without relief.  She understands that definitive treatment is a medial compartment knee replacement but is not at a point in time to proceed with this.  She tells me that her son recently underwent a relatively large knee surgery about 4 weeks ago following a soccer injury and is at home taking care of him.  Review of Systems as detailed in HPI.  All others reviewed and are negative.   Objective: Vital Signs: Ht 5' 4.5" (1.638 m)   Wt 238 lb (108 kg)   BMI 40.22 kg/m   Physical Exam well-developed and well-nourished female in no acute distress.  Alert and oriented x3.  Ortho Exam left knee exam shows a very small effusion.  Range of motion 0 to 110 degrees.  Medial joint line tenderness.  She is neurovascular intact distally.  Specialty Comments:  No specialty comments available.  Imaging: No new imaging   PMFS History: Patient Active Problem List   Diagnosis Date Noted   Primary osteoarthritis  of left knee 03/19/2022   S/P arthroscopy of left knee 02/02/2022   Chronic pain of left knee 02/02/2022   Acute medial meniscal injury of knee, left, initial encounter 07/18/2021   Chronic migraine without aura without status migrainosus, not intractable 04/14/2021   Muscle spasm 06/20/2018   Unspecified dyspareunia (CODE) 03/20/2017   Amenorrhea 03/20/2017   Irritable bowel syndrome 01/01/2017   Status migrainosus 01/01/2017   Anxiety 01/01/2017   Vaginal discharge 12/17/2016   GERD (gastroesophageal reflux disease) 10/30/2016   Mixed hyperlipidemia 10/30/2016   Diabetes type 2, controlled ([place] [facility]) 09/12/2015   OSA (obstructive sleep apnea) 02/16/2015   Asthma  10/29/2011   Past Medical History:  Diagnosis Date   Acid reflux    Asthma    Diabetes mellitus without complication (HCC)    Hyperlipidemia    PCOS (polycystic ovarian syndrome)    PONV (postoperative nausea and vomiting)    Sleep apnea    uses cpap    Family History  Problem Relation Age of Onset   Cancer Mother    Hypertension Mother    Diabetes Mother    Breast cancer Mother 84   Cancer Maternal Grandmother    Breast cancer Maternal Grandmother 24   Cancer Father    Diabetes Father    Hypertension Father    Breast cancer Paternal Aunt 25   Breast cancer Paternal Aunt 33   Breast cancer Paternal Aunt 28    Past Surgical History:  Procedure Laterality Date   BREAST EXCISIONAL BIOPSY Right 06/28/2020   papilloma   DILATION AND EVACUATION N/A 06/05/2017   Procedure: DILATATION AND EVACUATION;  Surgeon: Sanjuana Kava, MD;  Location: [facility];  Service: Gynecology;  Laterality: N/A;   KNEE ARTHROSCOPY WITH MENISCAL REPAIR Left 09/20/2021   Procedure: LEFT KNEE PARTIAL MEDIAL MENISCECTOMY;  Surgeon: Leandrew Koyanagi, MD;  Location: [facility];  Service: Orthopedics;  Laterality: Left;   RADIOACTIVE SEED GUIDED EXCISIONAL BREAST BIOPSY Right 06/28/2020   Procedure: RIGHT RADIOACTIVE SEED GUIDED EXCISIONAL BREAST BIOPSY;  Surgeon: Rolm Bookbinder, MD;  Location: [facility];  Service: General;  Laterality: Right;   WISDOM TOOTH EXTRACTION     Social History   Occupational History   Not on file  Tobacco Use   Smoking status: Never    Passive exposure: Never   Smokeless tobacco: Never  Vaping Use   Vaping Use: Never used  Substance and Sexual Activity   Alcohol use: Yes    Comment: occas wine   Drug use: No   Sexual activity: Yes    Birth control/protection: None

## 2022-06-28 ENCOUNTER — Other Ambulatory Visit (HOSPITAL_COMMUNITY): Payer: Self-pay

## 2022-06-29 ENCOUNTER — Other Ambulatory Visit: Payer: Self-pay

## 2022-06-29 ENCOUNTER — Other Ambulatory Visit (HOSPITAL_COMMUNITY): Payer: Self-pay

## 2022-07-03 ENCOUNTER — Other Ambulatory Visit (HOSPITAL_COMMUNITY): Payer: Self-pay

## 2022-07-03 ENCOUNTER — Other Ambulatory Visit: Payer: Self-pay

## 2022-07-03 MED ORDER — QVAR REDIHALER 40 MCG/ACT IN AERB: INHALATION_SPRAY | RESPIRATORY_TRACT | 2 refills | Status: DC

## 2022-07-06 ENCOUNTER — Other Ambulatory Visit (HOSPITAL_COMMUNITY): Payer: Self-pay

## 2022-07-06 MED ORDER — OMEPRAZOLE 40 MG PO CPDR
40.0000 mg | DELAYED_RELEASE_CAPSULE | Freq: Every day | ORAL | 1 refills | Status: DC
  Filled 2022-07-06: qty 30, 30d supply, fill #0

## 2022-07-09 ENCOUNTER — Other Ambulatory Visit (HOSPITAL_COMMUNITY): Payer: Self-pay

## 2022-07-10 ENCOUNTER — Other Ambulatory Visit (HOSPITAL_COMMUNITY): Payer: Self-pay

## 2022-07-11 ENCOUNTER — Other Ambulatory Visit (HOSPITAL_COMMUNITY): Payer: Self-pay

## 2022-07-17 DIAGNOSIS — G4733 Obstructive sleep apnea (adult) (pediatric): Secondary | ICD-10-CM | POA: Diagnosis not present

## 2022-07-17 DIAGNOSIS — E7800 Pure hypercholesterolemia, unspecified: Secondary | ICD-10-CM | POA: Diagnosis not present

## 2022-07-17 DIAGNOSIS — G43909 Migraine, unspecified, not intractable, without status migrainosus: Secondary | ICD-10-CM | POA: Diagnosis not present

## 2022-07-17 DIAGNOSIS — F419 Anxiety disorder, unspecified: Secondary | ICD-10-CM | POA: Diagnosis not present

## 2022-07-17 DIAGNOSIS — G4700 Insomnia, unspecified: Secondary | ICD-10-CM | POA: Diagnosis not present

## 2022-07-17 DIAGNOSIS — I10 Essential (primary) hypertension: Secondary | ICD-10-CM | POA: Diagnosis not present

## 2022-07-17 DIAGNOSIS — E1165 Type 2 diabetes mellitus with hyperglycemia: Secondary | ICD-10-CM | POA: Diagnosis not present

## 2022-07-30 ENCOUNTER — Other Ambulatory Visit (HOSPITAL_COMMUNITY): Payer: Self-pay

## 2022-07-31 ENCOUNTER — Other Ambulatory Visit (HOSPITAL_COMMUNITY): Payer: Self-pay

## 2022-08-02 ENCOUNTER — Other Ambulatory Visit (HOSPITAL_COMMUNITY): Payer: Self-pay

## 2022-08-02 MED ORDER — HYDROXYZINE HCL 10 MG PO TABS
20.0000 mg | ORAL_TABLET | Freq: Every evening | ORAL | 0 refills | Status: DC
  Filled 2022-08-02: qty 180, 90d supply, fill #0

## 2022-08-03 ENCOUNTER — Other Ambulatory Visit (HOSPITAL_COMMUNITY): Payer: Self-pay

## 2022-08-03 ENCOUNTER — Other Ambulatory Visit: Payer: Self-pay

## 2022-08-03 MED ORDER — OMEPRAZOLE 40 MG PO CPDR
40.0000 mg | DELAYED_RELEASE_CAPSULE | Freq: Every day | ORAL | 1 refills | Status: DC
  Filled 2022-08-03: qty 90, 90d supply, fill #0
  Filled 2022-10-21: qty 90, 90d supply, fill #1

## 2022-08-03 MED ORDER — OMEPRAZOLE 40 MG PO CPDR
40.0000 mg | DELAYED_RELEASE_CAPSULE | Freq: Every day | ORAL | 1 refills | Status: DC
  Filled 2022-08-03: qty 90, 90d supply, fill #0

## 2022-08-03 MED ORDER — LISINOPRIL 10 MG PO TABS
10.0000 mg | ORAL_TABLET | Freq: Every day | ORAL | 1 refills | Status: DC
  Filled 2022-08-03: qty 90, 90d supply, fill #0

## 2022-08-03 MED ORDER — LISINOPRIL 20 MG PO TABS
ORAL_TABLET | ORAL | 3 refills | Status: DC
  Filled 2022-08-06: qty 90, 90d supply, fill #0

## 2022-08-06 ENCOUNTER — Other Ambulatory Visit (HOSPITAL_COMMUNITY): Payer: Self-pay

## 2022-08-06 MED ORDER — MOUNJARO 12.5 MG/0.5ML ~~LOC~~ SOAJ
12.5000 mg | SUBCUTANEOUS | 5 refills | Status: DC
  Filled 2022-08-06: qty 2, 28d supply, fill #0

## 2022-08-07 ENCOUNTER — Other Ambulatory Visit (HOSPITAL_COMMUNITY): Payer: Self-pay

## 2022-08-07 DIAGNOSIS — F411 Generalized anxiety disorder: Secondary | ICD-10-CM | POA: Diagnosis not present

## 2022-08-07 DIAGNOSIS — F3181 Bipolar II disorder: Secondary | ICD-10-CM | POA: Diagnosis not present

## 2022-08-07 MED ORDER — HYDROXYZINE HCL 10 MG PO TABS: 20.0000 mg | ORAL_TABLET | Freq: Every day | ORAL | 0 refills | Status: DC

## 2022-08-07 MED ORDER — QUETIAPINE FUMARATE ER 300 MG PO TB24
600.0000 mg | ORAL_TABLET | Freq: Every day | ORAL | 1 refills | Status: DC
  Filled 2022-08-07: qty 180, 90d supply, fill #0
  Filled 2022-11-01: qty 180, 90d supply, fill #1

## 2022-08-07 MED ORDER — ALPRAZOLAM 0.25 MG PO TABS
0.2500 mg | ORAL_TABLET | Freq: Every day | ORAL | 2 refills | Status: DC
  Filled 2022-08-07: qty 30, 30d supply, fill #0
  Filled 2022-09-29 (×2): qty 30, 30d supply, fill #1
  Filled 2022-11-01: qty 30, 30d supply, fill #2

## 2022-08-07 MED ORDER — ZOLPIDEM TARTRATE ER 12.5 MG PO TBCR: 12.5000 mg | EXTENDED_RELEASE_TABLET | Freq: Every day | ORAL | 0 refills | Status: DC

## 2022-08-08 ENCOUNTER — Other Ambulatory Visit (HOSPITAL_COMMUNITY): Payer: Self-pay

## 2022-08-10 ENCOUNTER — Other Ambulatory Visit (HOSPITAL_COMMUNITY): Payer: Self-pay

## 2022-08-10 MED ORDER — FARXIGA 10 MG PO TABS
10.0000 mg | ORAL_TABLET | Freq: Every day | ORAL | 3 refills | Status: DC
  Filled 2022-08-10: qty 30, 30d supply, fill #0

## 2022-08-14 ENCOUNTER — Other Ambulatory Visit (HOSPITAL_COMMUNITY): Payer: Self-pay

## 2022-08-14 MED ORDER — FARXIGA 10 MG PO TABS
10.0000 mg | ORAL_TABLET | Freq: Every day | ORAL | 5 refills | Status: DC
  Filled 2022-08-14 – 2022-09-29 (×2): qty 90, 90d supply, fill #0
  Filled 2022-12-05 – 2022-12-20 (×3): qty 90, 90d supply, fill #1
  Filled 2023-03-13: qty 90, 90d supply, fill #2

## 2022-08-14 MED ORDER — MOUNJARO 15 MG/0.5ML ~~LOC~~ SOAJ
15.0000 mg | SUBCUTANEOUS | 5 refills | Status: DC
  Filled 2022-08-14 – 2022-08-24 (×3): qty 2, 28d supply, fill #0
  Filled 2022-09-18: qty 2, 28d supply, fill #1
  Filled 2022-10-23: qty 2, 28d supply, fill #2
  Filled 2022-11-17: qty 2, 28d supply, fill #3
  Filled 2022-12-25: qty 2, 28d supply, fill #4
  Filled 2023-01-29: qty 2, 28d supply, fill #5
  Filled 2023-02-23 (×2): qty 2, 28d supply, fill #6
  Filled 2023-04-03: qty 2, 28d supply, fill #7
  Filled 2023-05-02: qty 2, 28d supply, fill #8
  Filled 2023-06-07: qty 2, 28d supply, fill #9
  Filled 2023-07-15: qty 2, 28d supply, fill #10

## 2022-08-17 ENCOUNTER — Other Ambulatory Visit (HOSPITAL_COMMUNITY): Payer: Self-pay

## 2022-08-23 NOTE — Progress Notes (Signed)
Cardiology Office Note:    Date:  08/23/2022   ID:  Julie Hansen, DOB April 06, 1971, MRN [id number]  PCP:  Chesley Noon, MD  Cardiologist:  Buford Dresser, MD  Referring MD: Chesley Noon, MD   CC: follow up   History of Present Illness:    Julie Hansen is a 51 y.o. female with a hx of headache, breast masses who is seen for follow-up. I initially met her 07/27/2020 as a new consult at the request of Chesley Noon, MD for the evaluation and management of shortness of breath and tachycardia/palpitations.  -Prior cardiac history: filled up with fluid after her son was born. Had an echo at that time.  -Prior workup: had ETT done at [facility] 05/15/2019. I cannot see full result, but comment notes it as normal  Today: Overall, she is feeling good. However, she is suffering from random episodes of chest discomfort that she is not sure if caused by chest pain, indigestion, or something else.  During these episodes, she feels a lot of pressure in her central chest, but not necessarily pain. She will also describes this as a squeezing and tightness in her chest, and it may radiate to her right shoulder. The episodes are more noticeable when she is relaxing, very few times they have occurred when she was busy and caused her to stop. Typically her chest discomfort will last for 5-10 minutes, but does not occur too often. There are no known triggers, and no correlations with food.  With her chest discomfort episodes, she sometimes has associated palpitations and shortness of breath. Her palpitations also may occur independently 2-3 times a week, randomly at rest or while walking. Her shortness of breath is also noted with exertion.  Also, she has dizzy spells associated with white floaters, but also notes her A1C is elevated at 9.2   She endorses mild LE edema.  Yesterday her Tyler Aas was changed to 32 units in the AM and 74 units in the PM.  Today,  She denies  any palpitations, chest pain, shortness of breath, or peripheral edema. No lightheadedness, headaches, syncope, orthopnea, or PND.  (+) Had knee surgery sinc elsats een.  Gotten down to 225.  Tingling ens of peripheral fingers Vauge headaches a month ago and blood pressure was "Sky high" 156/84 last night. Took last night and took additional 32m.   Racing heart - not every night but stop in tracks. 3-4 months.  Swelling in her feet  Shortness of breath with more than usual activity. Husband notes when she is talking she sounds more short of breath.   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   DILATION AND EVACUATION N/A 06/05/2017   Procedure: DILATATION AND EVACUATION;  Surgeon: PSanjuana Kava MD;  [facility];  Service: Gynecology;  Laterality: N/A;   KNEE ARTHROSCOPY WITH MENISCAL REPAIR Left 09/20/2021   Procedure: LEFT KNEE PARTIAL MEDIAL MENISCECTOMY;  Surgeon: XLeandrew Koyanagi MD;  [place] [facility]  Service: Orthopedics;  Laterality: Left;   RADIOACTIVE SEED GUIDED EXCISIONAL BREAST BIOPSY Right 06/28/2020   Procedure: RIGHT RADIOACTIVE SEED GUIDED EXCISIONAL BREAST  BIOPSY;  Surgeon: Rolm Bookbinder, MD;  Location: [place];  Service: General;  Laterality: Right;   WISDOM TOOTH EXTRACTION      Current Medications: Current Outpatient Medications on File Prior to Visit  Medication Sig   Accu-Chek FastClix Lancets MISC Use 4 times a day   albuterol (PROVENTIL) (2.5 MG/3ML) 0.083% nebulizer solution Take 2.5 mg by nebulization every 6 (six) hours as needed.   ALPRAZolam (XANAX) 0.25 MG tablet Take 1 tablet by mouth daily as needed    ALPRAZolam (XANAX) 0.25 MG tablet Take 1 tablet by mouth daily as needed   ALPRAZolam (XANAX) 0.25 MG tablet Take 1 tablet by mouth daily as needed   ALPRAZolam (XANAX) 0.25 MG tablet Take 1 tablet (0.25 mg total) by mouth daily.   atorvastatin (LIPITOR) 40 MG tablet Take one tablet (40 mg dose) by mouth daily.   baclofen (LIORESAL) 10 MG tablet TAKE 1 TABLET BY MOUTH DAILY AS NEEDED UP TO 3 TIMES DAILY   beclomethasone (QVAR REDIHALER) 40 MCG/ACT inhaler Inhale 2 puffs into the lungs 2 times daily   beclomethasone (QVAR REDIHALER) 40 MCG/ACT inhaler Inhale two puffs into the lungs 2 (two) times daily.   Botulinum Toxin Type A (BOTOX) 200 units SOLR Inject 200 Units as directed every 3 (three) months.   cetirizine (ZYRTEC ALLERGY) 10 MG tablet Take 1 tablet (10 mg total) by mouth daily.   Continuous Blood Gluc Sensor (FREESTYLE LIBRE 3 SENSOR) MISC Use as directed, replace every 14 days.   Continuous Blood Gluc Sensor (FREESTYLE LIBRE SENSOR SYSTEM) MISC Use as directed every 14 days   dapagliflozin propanediol (FARXIGA) 10 MG TABS tablet Take 1 tablet (10 mg total) by mouth daily.   dapagliflozin propanediol (FARXIGA) 10 MG TABS tablet Take 1 tablet (10 mg total) by mouth daily.   fenofibrate (TRICOR) 48 MG tablet TAKE ONE TABLET BY MOUTH DAILY   fenofibrate (TRICOR) 48 MG tablet TAKE ONE TABLET BY MOUTH DAILY   fluticasone (FLONASE) 50 MCG/ACT nasal spray USE 1 SPRAY INTO BOTH NOSTRILS DAILY.   gabapentin (NEURONTIN) 400 MG capsule Take 1 capsule (400 mg total) by mouth 3 (three) times daily.   glucose blood test strip Use to check blood sugar 3 times a day   hydrOXYzine (ATARAX) 10 MG tablet Take 2 tablets (20 mg total) by mouth at bedtime.   [START ON 09/24/2022] hydrOXYzine (ATARAX) 10 MG tablet Take 2 tablets (20 mg total) by mouth at bedtime.   ibuprofen (ADVIL,MOTRIN) 800 MG tablet Take 1 tablet (800 mg total) by mouth every 8 (eight) hours as needed.   insulin degludec (TRESIBA  FLEXTOUCH) 200 UNIT/ML FlexTouch Pen Inject into the skin 32 units in the morning and 74 units in the evening   insulin lispro (HUMALOG KWIKPEN) 100 UNIT/ML KwikPen INJECT 20 - 25 UNITS INTO THE SKIN 3 TIMES A DAY   insulin lispro (HUMALOG) 100 UNIT/ML KwikPen INJECT 20 - 25 UNITS SUBCUTANEOUS 3 TIMES A DAY   Lasmiditan Succinate (REYVOW) 100 MG TABS Take 2 tablets (200 mg) by mouth as needed for headache   linaclotide (LINZESS) 290 MCG CAPS capsule Take 1 capsule by mouth 30 minutes before the first meal   linaclotide (LINZESS) 290 MCG CAPS capsule Take 1 capsule (290 mcg total) by mouth 20 minutes before breakfast   liothyronine (CYTOMEL) 5 MCG tablet Take 1 tablet by mouth every morning on an empty stomach increase to 2 tablets in 7 days as directed   lisinopril (ZESTRIL) 10  MG tablet Take 1 tablet (10 mg total) by mouth daily.   lisinopril (ZESTRIL) 20 MG tablet Take one tablet (20 mg dose) by mouth daily.   lisinopril (ZESTRIL) 5 MG tablet Take one tablet (5 mg dose) by mouth daily.   meloxicam (MOBIC) 15 MG tablet Take 1 tablet (15 mg total) by mouth daily with a meal   omeprazole (PRILOSEC) 40 MG capsule Take 1 capsule (40 mg total) by mouth daily.   omeprazole (PRILOSEC) 40 MG capsule Take 1 capsule (40 mg total) by mouth daily.   omeprazole (PRILOSEC) 40 MG capsule Take 1 capsule (40 mg total) by mouth daily.   pantoprazole (PROTONIX) 40 MG tablet Take one tablet (40 mg dose) by mouth daily.   promethazine (PHENERGAN) 25 MG tablet TAKE 1 TABLET BY MOUTH EVERY 6 HOURS AS NEEDED.   QUEtiapine (SEROQUEL XR) 300 MG 24 hr tablet Take 2 tablets by mouth every evening   QUEtiapine (SEROQUEL XR) 300 MG 24 hr tablet Take 2 tablets (600 mg total) by mouth at bedtime.   QUEtiapine (SEROQUEL XR) 300 MG 24 hr tablet Take 2 tablets (600 mg total) by mouth at bedtime.   tirzepatide [facility]) 10 MG/0.5ML Pen Inject 1 pen into the skin once a week as directed   tirzepatide (MOUNJARO) 10 MG/0.5ML Pen  Inject as directed subcutaneously once a week   tirzepatide (MOUNJARO) 12.5 MG/0.5ML Pen Inject 12.45m into the skin once a week   tirzepatide (MOUNJARO) 12.5 MG/0.5ML Pen Inject 12.5 mg into the skin once a week.   tirzepatide (MOUNJARO) 15 MG/0.5ML Pen Inject 15 mg into the skin once a week.   tirzepatide (MOUNJARO) 7.5 MG/0.5ML Pen Administer 7.579msubcutaneously once a week   traMADol (ULTRAM) 50 MG tablet Take 1 tablet by mouth every 12 hours as needed.   Vitamin D, Ergocalciferol, (DRISDOL) 1.25 MG (50000 UNIT) CAPS capsule TAKE ONE CAPSULE BY MOUTH TWO TIMES A WEEK   zolpidem (AMBIEN CR) 12.5 MG CR tablet Take 1 at bedtime   zolpidem (AMBIEN CR) 12.5 MG CR tablet Take 1 tablet (12.5 mg total) by mouth at bedtime.   zolpidem (AMBIEN CR) 12.5 MG CR tablet Take 1 tablet (12.5 mg total) by mouth at bedtime. (fill 04/05/22)   [START ON 09/24/2022] zolpidem (AMBIEN CR) 12.5 MG CR tablet Take 1 tablet (12.5 mg total) by mouth at bedtime.   No current facility-administered medications on file prior to visit.     Allergies:   Macrodantin [nitrofurantoin macrocrystal] and Carbamazepine   Social History   Tobacco Use   Smoking status: Never    Passive exposure: Never   Smokeless tobacco: Never  Vaping Use   Vaping Use: Never used  Substance Use Topics   Alcohol use: Yes    Comment: occas wine   Drug use: No    Family History: family history includes Breast cancer (age of onset: 4839in her mother; Breast cancer (age of onset: 5022in her maternal grandmother, paternal aunt, paternal aunt, and paternal aunt; Cancer in her father, maternal grandmother, and mother; Diabetes in her father and mother; Hypertension in her father and mother. cousin died of MI at age 9322Pat aunt had open heart surgery. Both parents have diabetes, high blood pressure, no heart issues.  ROS:   Please see the history of present illness.  All other systems are reviewed and negative.    EKGs/Labs/Other Studies  Reviewed:    The following studies were reviewed today:  Coronary CTA  08/31/2021: FINDINGS:  Coronary calcium score: The patient's coronary artery calcium score is 0, which places the patient in the 0 percentile.   Coronary arteries: Normal coronary origins.  Right dominance.   Right Coronary Artery: Normal caliber vessel, gives rise to PDA. No significant plaque or stenosis.   Left Main Coronary Artery: Normal caliber vessel. No significant plaque or stenosis.   Left Anterior Descending Coronary Artery: Normal caliber vessel. No significant plaque or stenosis. Gives rise to 1 diagonal branches.   Left Circumflex Artery: Normal caliber vessel. No significant plaque or stenosis. Gives rise to 1 OM branches.   Aorta: Normal size, 32 mm at the mid ascending aorta (level of the PA bifurcation) measured double oblique. No calcifications. No dissection seen in visualized portions of the aorta.   Aortic Valve: No calcifications. Trileaflet.   Other findings:   Normal pulmonary vein drainage into the left atrium.   Normal left atrial appendage without a thrombus.   Normal size of the pulmonary artery.   Normal appearance of the pericardium.   IMPRESSION: 1. No evidence of CAD, CADRADS = 0.   2. Coronary calcium score of 0. This was 0 percentile for age and sex matched control.   3. Normal coronary origin with right dominance.  Monitor 07/2020: 4 days of data recorded on Zio monitor. Patient had a min HR of 58 bpm, max HR of 136 bpm, and avg HR of 99 bpm. Predominant underlying rhythm was Sinus Rhythm. No VT, SVT, atrial fibrillation, high degree block, or pauses noted. Isolated atrial and ventricular ectopy was rare (<1%). There were 46 triggered events. These were all sinus rhythm, several with single ectopic beat. No significant arrhythmias detected.  Echo 08/10/2020: Sonographer Comments: 2ch apical excluded from [place] measurements due to poor tracking   IMPRESSIONS   1.  Left ventricular ejection fraction, by estimation, is 70 to 75%. The  left ventricle has hyperdynamic function. The left ventricle has no  regional wall motion abnormalities. Left ventricular diastolic parameters  are consistent with Grade I diastolic  dysfunction (impaired relaxation).   2. Right ventricular systolic function is normal. The right ventricular  size is normal.   3. The mitral valve is normal in structure. No evidence of mitral valve  regurgitation. No evidence of mitral stenosis.   4. The aortic valve is normal in structure. Aortic valve regurgitation is  not visualized. No aortic stenosis is present.   5. The inferior vena cava is normal in size with greater than 50%  respiratory variability, suggesting right atrial pressure of 3 mmHg.   CT-A Chest 07/05/2020: COMPARISON:  Chest CT dated 11/14/2007.   FINDINGS: Cardiovascular: There is no cardiomegaly or pericardial effusion. The thoracic aorta is unremarkable. The origins of the great vessels of the aortic arch appear patent as visualized. Evaluation of the pulmonary arteries is limited due to respiratory motion artifact. No pulmonary artery embolus identified.   Mediastinum/Nodes: There is no hilar or mediastinal adenopathy. The esophagus and the thyroid gland are grossly unremarkable. No mediastinal fluid collection.   Lungs/Pleura: The lungs are clear. There is no pleural effusion pneumothorax. The central airways are patent.   Upper Abdomen: No acute abnormality.   Musculoskeletal: No acute osseous pathology. Small pocket of air in the superficial soft tissues of the right breast likely related to recent surgery.   Review of the MIP images confirms the above findings.   IMPRESSION: 1. No acute intrathoracic pathology. No CT evidence of pulmonary embolism. 2. Small pocket of air in the  superficial soft tissues of the right breast likely related to recent surgery.  EKG:  EKG is personally reviewed.    08/24/2022: 08/11/2021: NSR at 79 bpm, PRWP 07/27/2020: NSR at 93 bpm  Recent Labs: 02/15/2022: ALT 14; BUN 14; Creatinine, Ser 0.94; Potassium 3.8; Sodium 139   Recent Lipid Panel    Component Value Date/Time   CHOL 167 02/15/2022 0858   TRIG 145 02/15/2022 0858   HDL 55 02/15/2022 0858   CHOLHDL 3.0 02/15/2022 0858   VLDL 29 02/15/2022 0858   LDLCALC 83 02/15/2022 0858   LDLDIRECT 121.5 (H) 12/23/2019 3267    Physical Exam:    VS:  There were no vitals taken for this visit.    Wt Readings from Last 3 Encounters:  06/27/22 238 lb (108 kg)  06/22/22 240 lb (108.9 kg)  03/09/22 234 lb (106.1 kg)    GEN: Well nourished, well developed in no acute distress HEENT: Normal, moist mucous membranes NECK: No JVD CARDIAC: regular rhythm, normal S1 and S2, no rubs or gallops. No murmurs. VASCULAR: Radial and DP pulses 2+ bilaterally. No carotid bruits RESPIRATORY:  Clear to auscultation without rales, wheezing or rhonchi  ABDOMEN: Soft, non-tender, non-distended MUSCULOSKELETAL:  Ambulates independently SKIN: Warm and dry, no edema NEUROLOGIC:  Alert and oriented x 3. No focal neuro deficits noted. PSYCHIATRIC:  Normal affect    ASSESSMENT:    No diagnosis found.   PLAN:    Chest pain Tachycardia Palpitations Shortness of breath -prior Echo without significant abnormalities, only grade 1 diastolic dysfunction.  -Monitor without significant arrhythmias, symptoms were sinus with rare ectopy. -given chest pain and risk factors, we discussed options for further evaluation today --discussed treadmill stress, nuclear stress/lexiscan, and CT coronary angiography. Discussed pros and cons of each, including but not limited to false positive/false negative risk, radiation risk, and risk of IV contrast dye. Based on shared decision making, decision was made to pursue CT coronary angiography. -will give one time dose of metoprolol 2 hours prior to scheduled test -counseled on need to  get BMET prior to test -counseled on use of sublingual nitroglycerin and its importance to a good test   Cardiac risk counseling and prevention recommendations: -recommend heart healthy/Mediterranean diet, with whole grains, fruits, vegetable, fish, lean meats, nuts, and olive oil. Limit salt. -recommend moderate walking, 3-5 times/week for 30-50 minutes each session. Aim for at least 150 minutes.week. Goal should be pace of 3 miles/hours, or walking 1.5 miles in 30 minutes -recommend avoidance of tobacco products. Avoid excess alcohol.  -ASCVD risk score: The 10-year ASCVD risk score (Arnett DK, et al., 2019) is: 5.4%   Values used to calculate the score:     Age: 37 years     Sex: Female     Is Non-Hispanic African American: Yes     Diabetic: Yes     Tobacco smoker: No     Systolic Blood Pressure: 124 mmHg     Is BP treated: Yes     HDL Cholesterol: 55 mg/dL     Total Cholesterol: 167 mg/dL    Plan for follow up:  ***1 year or sooner based on Coronary CT results  Buford Dresser, MD, PhD [place]  [facility]    Medication Adjustments/Labs and Tests Ordered: Current medicines are reviewed at length with the patient today.  Concerns regarding medicines are outlined above.   No orders of the defined types were placed in this encounter.  No orders of the defined types were placed  in this encounter.  There are no Patient Instructions on file for this visit.   I,Mathew Stumpf,acting as a Education administrator for [employer], MD.,have documented all relevant documentation on the behalf of Buford Dresser, MD,as directed by  Buford Dresser, MD while in the presence of Buford Dresser, MD.  I, Madelin Rear, have reviewed all documentation for this visit. The documentation on 08/23/22 for the exam, diagnosis, procedures, and orders are all accurate and complete.   Signed, Buford Dresser, MD PhD 08/23/2022     [place]

## 2022-08-24 ENCOUNTER — Ambulatory Visit (HOSPITAL_BASED_OUTPATIENT_CLINIC_OR_DEPARTMENT_OTHER): Payer: 59 | Admitting: Family

## 2022-08-24 ENCOUNTER — Encounter (HOSPITAL_BASED_OUTPATIENT_CLINIC_OR_DEPARTMENT_OTHER): Payer: Self-pay | Admitting: Cardiology

## 2022-08-24 ENCOUNTER — Other Ambulatory Visit (HOSPITAL_COMMUNITY): Payer: Self-pay

## 2022-08-24 ENCOUNTER — Other Ambulatory Visit (INDEPENDENT_AMBULATORY_CARE_PROVIDER_SITE_OTHER): Payer: 59

## 2022-08-24 VITALS — BP 121/71 | HR 90 | Ht 64.0 in | Wt 231.4 lb

## 2022-08-24 DIAGNOSIS — R0609 Other forms of dyspnea: Secondary | ICD-10-CM

## 2022-08-24 DIAGNOSIS — I10 Essential (primary) hypertension: Secondary | ICD-10-CM

## 2022-08-24 DIAGNOSIS — E782 Mixed hyperlipidemia: Secondary | ICD-10-CM | POA: Diagnosis not present

## 2022-08-24 DIAGNOSIS — R002 Palpitations: Secondary | ICD-10-CM

## 2022-08-24 DIAGNOSIS — R600 Localized edema: Secondary | ICD-10-CM | POA: Diagnosis not present

## 2022-08-24 MED ORDER — VALSARTAN-HYDROCHLOROTHIAZIDE 160-25 MG PO TABS
1.0000 | ORAL_TABLET | Freq: Every day | ORAL | 5 refills | Status: DC
  Filled 2022-08-24: qty 30, 30d supply, fill #0
  Filled 2022-09-21: qty 30, 30d supply, fill #1
  Filled 2022-10-21: qty 30, 30d supply, fill #2

## 2022-08-24 MED ORDER — METOPROLOL SUCCINATE ER 25 MG PO TB24
25.0000 mg | ORAL_TABLET | Freq: Every day | ORAL | 5 refills | Status: DC
  Filled 2022-08-24: qty 30, 30d supply, fill #0
  Filled 2022-09-21: qty 30, 30d supply, fill #1
  Filled 2022-10-21: qty 30, 30d supply, fill #2
  Filled 2022-11-23: qty 30, 30d supply, fill #3

## 2022-08-24 NOTE — Patient Instructions (Signed)
Medication Instructions:   Your physician has recommended you make the following change in your medication:   STOP Lisinopril  START Valsartan-HCTZ 160-'25mg'$  one tablet daily  START Metoprolol Succinate one '25mg'$  tablet daily   *If you need a refill on your cardiac medications before your next appointment, please call your pharmacy*   Lab Work: Your physician recommends that you return for lab work in 1 week: BMP, magnesium, TSH, CBC  If you have labs (blood work) drawn today and your tests are completely normal, you will receive your results only by: [place] (if you have [place]) OR A paper copy in the mail If you have any lab test that is abnormal or we need to change your treatment, we will call you to review the results.   Testing/Procedures:  Your physician has recommended that you wear a Zio monitor.   This monitor is a medical device that records the heart's electrical activity. Doctors most often use these monitors to diagnose arrhythmias. Arrhythmias are problems with the speed or rhythm of the heartbeat. The monitor is a small device applied to your chest. You can wear one while you do your normal daily activities. While wearing this monitor if you have any symptoms to push the button and record what you felt. Once you have worn this monitor for the period of time provider prescribed (Usually 14 days), you will return the monitor device in the postage paid box. Once it is returned they will download the data collected and provide [place] with a report which the provider will then review and we will call you with those results. Important tips:  Avoid showering during the first 24 hours of wearing the monitor. Avoid excessive sweating to help maximize wear time. Do not submerge the device, no hot tubs, and no swimming pools. Keep any lotions or oils away from the patch. After 24 hours you may shower with the patch on. Take brief showers with your back facing the shower head.   Do not remove patch once it has been placed because that will interrupt data and decrease adhesive wear time. Push the button when you have any symptoms and write down what you were feeling. Once you have completed wearing your monitor, remove and place into box which has postage paid and place in your outgoing mailbox.  If for some reason you have misplaced your box then call our office and we can provide another box and/or mail it off for you.   Your physician has requested that you have an echocardiogram. Echocardiography is a painless test that uses sound waves to create images of your heart. It provides your doctor with information about the size and shape of your heart and how well your heart's chambers and valves are working. This procedure takes approximately one hour. There are no restrictions for this procedure. Please do NOT wear cologne, perfume, aftershave, or lotions (deodorant is allowed). Please arrive 15 minutes prior to your appointment time.   Follow-Up: At [facility], you and your health needs are our priority.  As part of our continuing mission to provide you with exceptional heart care, we have created designated Provider Care Teams.  These Care Teams include your primary Cardiologist (physician) and Advanced Practice Providers (APPs -  Physician Assistants and Nurse Practitioners) who all work together to provide you with the care you need, when you need it.  We recommend signing up for the patient portal called "MyChart".  Sign up information is provided on this  After Visit Summary.  MyChart is used to connect with patients for Virtual Visits (Telemedicine).  Patients are able to view lab/test results, encounter notes, upcoming appointments, etc.  Non-urgent messages can be sent to your provider as well.   To learn more about what you can do with MyChart, go to [email or web address].    Your next appointment:   6-8 week(s)  The format for your next  appointment:   In Person  Provider:   Buford Dresser, MD or Laurann Montana, NP    Other Instructions  Heart Healthy Diet Recommendations: A low-salt diet is recommended. Meats should be grilled, baked, or boiled. Avoid fried foods. Focus on lean protein sources like fish or chicken with vegetables and fruits. The American Heart Association is a Microbiologist!  American Heart Association Diet and Lifeystyle Recommendations   Exercise recommendations: The American Heart Association recommends 150 minutes of moderate intensity exercise weekly. Try 30 minutes of moderate intensity exercise 4-5 times per week. This could include walking, jogging, or swimming.  To prevent or reduce lower extremity swelling: Eat a low salt diet. Salt makes the body hold onto extra fluid which causes swelling. Sit with legs elevated. For example, in the recliner or on an [place].  Wear knee-high compression stockings during the daytime. Ones labeled 15-20 mmHg provide good compression. Anticipate your swelling is due to venous insufficiency.

## 2022-08-28 ENCOUNTER — Encounter (HOSPITAL_BASED_OUTPATIENT_CLINIC_OR_DEPARTMENT_OTHER): Payer: Self-pay | Admitting: Family

## 2022-09-08 ENCOUNTER — Other Ambulatory Visit: Payer: Self-pay | Admitting: Physician Assistant

## 2022-09-08 ENCOUNTER — Other Ambulatory Visit (HOSPITAL_COMMUNITY): Payer: Self-pay

## 2022-09-08 DIAGNOSIS — G43009 Migraine without aura, not intractable, without status migrainosus: Secondary | ICD-10-CM

## 2022-09-13 ENCOUNTER — Other Ambulatory Visit (HOSPITAL_COMMUNITY): Payer: Self-pay

## 2022-09-14 DIAGNOSIS — R002 Palpitations: Secondary | ICD-10-CM | POA: Diagnosis not present

## 2022-09-14 DIAGNOSIS — I10 Essential (primary) hypertension: Secondary | ICD-10-CM | POA: Diagnosis not present

## 2022-09-18 ENCOUNTER — Ambulatory Visit (INDEPENDENT_AMBULATORY_CARE_PROVIDER_SITE_OTHER): Payer: 59

## 2022-09-18 ENCOUNTER — Encounter (HOSPITAL_BASED_OUTPATIENT_CLINIC_OR_DEPARTMENT_OTHER): Payer: Self-pay

## 2022-09-18 ENCOUNTER — Encounter: Payer: Self-pay | Admitting: Orthopaedic Surgery

## 2022-09-18 ENCOUNTER — Other Ambulatory Visit (HOSPITAL_COMMUNITY): Payer: Self-pay

## 2022-09-18 ENCOUNTER — Other Ambulatory Visit: Payer: Self-pay | Admitting: Physician Assistant

## 2022-09-18 DIAGNOSIS — G4733 Obstructive sleep apnea (adult) (pediatric): Secondary | ICD-10-CM | POA: Diagnosis not present

## 2022-09-18 DIAGNOSIS — R0609 Other forms of dyspnea: Secondary | ICD-10-CM

## 2022-09-18 DIAGNOSIS — R600 Localized edema: Secondary | ICD-10-CM | POA: Diagnosis not present

## 2022-09-18 DIAGNOSIS — I10 Essential (primary) hypertension: Secondary | ICD-10-CM

## 2022-09-18 LAB — ECHOCARDIOGRAM COMPLETE
AR max vel: 2.26 cm2
AV Area VTI: 2.12 cm2
AV Area mean vel: 1.98 cm2
AV Mean grad: 6 mmHg
AV Peak grad: 11.7 mmHg
Ao pk vel: 1.71 m/s
Area-P 1/2: 3.76 cm2
S' Lateral: 2.65 cm
Single Plane A2C EF: 85.3 %

## 2022-09-19 ENCOUNTER — Other Ambulatory Visit: Payer: Self-pay | Admitting: Physician Assistant

## 2022-09-19 ENCOUNTER — Other Ambulatory Visit: Payer: Self-pay

## 2022-09-19 ENCOUNTER — Other Ambulatory Visit (HOSPITAL_COMMUNITY): Payer: Self-pay

## 2022-09-19 MED ORDER — TRAMADOL HCL 50 MG PO TABS
50.0000 mg | ORAL_TABLET | Freq: Two times a day (BID) | ORAL | 2 refills | Status: DC | PRN
  Filled 2022-09-19 (×2): qty 60, 30d supply, fill #0
  Filled 2022-11-01: qty 60, 30d supply, fill #1
  Filled 2022-12-05 (×2): qty 60, 30d supply, fill #2

## 2022-09-19 NOTE — Telephone Encounter (Signed)
Sent in tramadol.  Can have her come in when there is an opening for injection.  Make sure she is aware that if she decides to have surgery that she will need to wait 3 months after inj before doing so

## 2022-09-21 ENCOUNTER — Other Ambulatory Visit (HOSPITAL_COMMUNITY): Payer: Self-pay

## 2022-09-21 ENCOUNTER — Ambulatory Visit: Payer: 59 | Admitting: Physician Assistant

## 2022-09-29 ENCOUNTER — Other Ambulatory Visit: Payer: Self-pay | Admitting: Physician Assistant

## 2022-09-29 ENCOUNTER — Other Ambulatory Visit (HOSPITAL_COMMUNITY): Payer: Self-pay

## 2022-09-29 DIAGNOSIS — G43009 Migraine without aura, not intractable, without status migrainosus: Secondary | ICD-10-CM

## 2022-10-01 ENCOUNTER — Other Ambulatory Visit: Payer: Self-pay

## 2022-10-02 ENCOUNTER — Other Ambulatory Visit (HOSPITAL_COMMUNITY): Payer: Self-pay

## 2022-10-02 MED ORDER — ZOLPIDEM TARTRATE ER 12.5 MG PO TBCR
12.5000 mg | EXTENDED_RELEASE_TABLET | Freq: Every evening | ORAL | 0 refills | Status: DC | PRN
  Filled 2022-10-02: qty 30, 30d supply, fill #0

## 2022-10-03 ENCOUNTER — Other Ambulatory Visit (HOSPITAL_COMMUNITY): Payer: Self-pay

## 2022-10-05 ENCOUNTER — Other Ambulatory Visit (HOSPITAL_COMMUNITY): Payer: Self-pay

## 2022-10-05 MED ORDER — ZOLPIDEM TARTRATE ER 12.5 MG PO TBCR
12.5000 mg | EXTENDED_RELEASE_TABLET | Freq: Every evening | ORAL | 0 refills | Status: DC
  Filled 2022-10-05 – 2022-11-01 (×2): qty 90, 90d supply, fill #0

## 2022-10-06 ENCOUNTER — Other Ambulatory Visit (HOSPITAL_COMMUNITY): Payer: Self-pay

## 2022-10-08 ENCOUNTER — Other Ambulatory Visit (HOSPITAL_COMMUNITY): Payer: Self-pay

## 2022-10-12 ENCOUNTER — Other Ambulatory Visit (HOSPITAL_COMMUNITY): Payer: Self-pay

## 2022-10-12 MED ORDER — PROMETHAZINE HCL 25 MG PO TABS
25.0000 mg | ORAL_TABLET | Freq: Four times a day (QID) | ORAL | 1 refills | Status: DC | PRN
  Filled 2022-10-12 – 2022-11-01 (×2): qty 30, 8d supply, fill #0
  Filled 2022-11-23: qty 30, 8d supply, fill #1

## 2022-10-21 ENCOUNTER — Other Ambulatory Visit (HOSPITAL_COMMUNITY): Payer: Self-pay

## 2022-10-23 ENCOUNTER — Other Ambulatory Visit (HOSPITAL_COMMUNITY): Payer: Self-pay

## 2022-10-24 ENCOUNTER — Other Ambulatory Visit (HOSPITAL_COMMUNITY): Payer: Self-pay

## 2022-10-29 ENCOUNTER — Encounter (HOSPITAL_BASED_OUTPATIENT_CLINIC_OR_DEPARTMENT_OTHER): Payer: Self-pay

## 2022-10-29 NOTE — Telephone Encounter (Signed)
Opened in error

## 2022-11-01 ENCOUNTER — Other Ambulatory Visit (HOSPITAL_COMMUNITY): Payer: Self-pay

## 2022-11-01 MED ORDER — HYDROXYZINE HCL 10 MG PO TABS
20.0000 mg | ORAL_TABLET | Freq: Every evening | ORAL | 0 refills | Status: DC
  Filled 2022-11-01: qty 180, 90d supply, fill #0

## 2022-11-02 ENCOUNTER — Other Ambulatory Visit (HOSPITAL_COMMUNITY): Payer: Self-pay

## 2022-11-03 ENCOUNTER — Other Ambulatory Visit (HOSPITAL_COMMUNITY): Payer: Self-pay

## 2022-11-03 MED ORDER — MELOXICAM 15 MG PO TABS
15.0000 mg | ORAL_TABLET | Freq: Every day | ORAL | 2 refills | Status: DC
  Filled 2022-11-03 – 2022-12-05 (×3): qty 90, 90d supply, fill #0
  Filled 2023-03-15: qty 90, 90d supply, fill #1
  Filled 2023-06-07: qty 90, 90d supply, fill #2

## 2022-11-10 ENCOUNTER — Other Ambulatory Visit (HOSPITAL_COMMUNITY): Payer: Self-pay

## 2022-11-16 ENCOUNTER — Encounter (HOSPITAL_BASED_OUTPATIENT_CLINIC_OR_DEPARTMENT_OTHER): Payer: Self-pay

## 2022-11-16 ENCOUNTER — Other Ambulatory Visit (HOSPITAL_COMMUNITY)
Admission: RE | Admit: 2022-11-16 | Discharge: 2022-11-16 | Disposition: A | Discharge status: Home / Self Care | Payer: Commercial Managed Care - PPO | Source: Ambulatory Visit | Attending: Family | Admitting: Family

## 2022-11-16 ENCOUNTER — Other Ambulatory Visit (HOSPITAL_COMMUNITY)
Admission: RE | Admit: 2022-11-16 | Discharge: 2022-11-16 | Disposition: A | Discharge status: Home / Self Care | Payer: Commercial Managed Care - PPO | Source: Ambulatory Visit | Attending: Endocrinology | Admitting: Endocrinology

## 2022-11-16 DIAGNOSIS — E1165 Type 2 diabetes mellitus with hyperglycemia: Secondary | ICD-10-CM | POA: Diagnosis not present

## 2022-11-16 DIAGNOSIS — I10 Essential (primary) hypertension: Secondary | ICD-10-CM | POA: Diagnosis not present

## 2022-11-16 DIAGNOSIS — R0609 Other forms of dyspnea: Secondary | ICD-10-CM | POA: Insufficient documentation

## 2022-11-16 DIAGNOSIS — E7800 Pure hypercholesterolemia, unspecified: Secondary | ICD-10-CM | POA: Insufficient documentation

## 2022-11-16 LAB — CBC
HCT: 36.6 % (ref 36.0–46.0)
Hemoglobin: 11.5 g/dL — ABNORMAL LOW (ref 12.0–15.0)
MCH: 27.9 pg (ref 26.0–34.0)
MCHC: 31.4 g/dL (ref 30.0–36.0)
MCV: 88.8 fL (ref 80.0–100.0)
Platelets: 439 10*3/uL — ABNORMAL HIGH (ref 150–400)
RBC: 4.12 MIL/uL (ref 3.87–5.11)
RDW: 14.2 % (ref 11.5–15.5)
WBC: 7.9 10*3/uL (ref 4.0–10.5)
nRBC: 0 % (ref 0.0–0.2)

## 2022-11-16 LAB — COMPREHENSIVE METABOLIC PANEL
ALT: 12 U/L (ref 0–44)
AST: 15 U/L (ref 15–41)
Albumin: 3.4 g/dL — ABNORMAL LOW (ref 3.5–5.0)
Alkaline Phosphatase: 90 U/L (ref 38–126)
Anion gap: 10 (ref 5–15)
BUN: 23 mg/dL — ABNORMAL HIGH (ref 6–20)
CO2: 27 mmol/L (ref 22–32)
Calcium: 8.7 mg/dL — ABNORMAL LOW (ref 8.9–10.3)
Chloride: 94 mmol/L — ABNORMAL LOW (ref 98–111)
Creatinine, Ser: 1.62 mg/dL — ABNORMAL HIGH (ref 0.44–1.00)
GFR, Estimated: 38 mL/min — ABNORMAL LOW (ref >60)
Glucose, Bld: 182 mg/dL — ABNORMAL HIGH (ref 70–99)
Potassium: 2.6 mmol/L — CL (ref 3.5–5.1)
Sodium: 131 mmol/L — ABNORMAL LOW (ref 135–145)
Total Bilirubin: 0.6 mg/dL (ref 0.3–1.2)
Total Protein: 7.6 g/dL (ref 6.5–8.1)

## 2022-11-16 LAB — HEMOGLOBIN A1C
Hgb A1c MFr Bld: 7.3 % — ABNORMAL HIGH (ref 4.8–5.6)
Mean Plasma Glucose: 162.81 mg/dL

## 2022-11-16 LAB — LIPID PANEL
Cholesterol: 183 mg/dL (ref 0–200)
HDL: 54 mg/dL (ref >40)
LDL Cholesterol: 96 mg/dL (ref 0–99)
Total CHOL/HDL Ratio: 3.4 RATIO
Triglycerides: 167 mg/dL — ABNORMAL HIGH (ref <150)
VLDL: 33 mg/dL (ref 0–40)

## 2022-11-16 LAB — TSH: TSH: 2.493 u[IU]/mL (ref 0.350–4.500)

## 2022-11-16 LAB — MAGNESIUM: Magnesium: 2.4 mg/dL (ref 1.7–2.4)

## 2022-11-17 ENCOUNTER — Other Ambulatory Visit (HOSPITAL_COMMUNITY): Payer: Self-pay

## 2022-11-18 LAB — MICROALBUMIN / CREATININE URINE RATIO
Creatinine, Urine: 92 mg/dL
Microalb Creat Ratio: 10 mg/g creat (ref 0–29)
Microalb, Ur: 8.8 ug/mL — ABNORMAL HIGH

## 2022-11-20 ENCOUNTER — Other Ambulatory Visit (HOSPITAL_COMMUNITY): Payer: Self-pay

## 2022-11-20 DIAGNOSIS — E1165 Type 2 diabetes mellitus with hyperglycemia: Secondary | ICD-10-CM | POA: Diagnosis not present

## 2022-11-20 DIAGNOSIS — E7800 Pure hypercholesterolemia, unspecified: Secondary | ICD-10-CM | POA: Diagnosis not present

## 2022-11-20 DIAGNOSIS — I10 Essential (primary) hypertension: Secondary | ICD-10-CM | POA: Diagnosis not present

## 2022-11-20 MED ORDER — VALSARTAN 160 MG PO TABS
160.0000 mg | ORAL_TABLET | Freq: Every day | ORAL | 3 refills | Status: DC
  Filled 2022-11-20: qty 90, 90d supply, fill #0

## 2022-11-20 NOTE — Telephone Encounter (Signed)
Pt. Following up

## 2022-11-20 NOTE — Telephone Encounter (Signed)
Rx Valsartan '160mg'$  QD. Stop Valsartan-HCTZ.  Eat some extra salt and drink fluid today to increase BP.   Tomorrow morning if BP <120/60, take half of Valsartan. Tomorrow morning if BP >120/60, take whole tablet.   Loel Dubonnet, NP

## 2022-11-20 NOTE — Addendum Note (Signed)
Addended by: Gerald Stabs on: 11/20/2022 09:11 AM   Modules accepted: Orders

## 2022-11-23 ENCOUNTER — Other Ambulatory Visit (HOSPITAL_COMMUNITY): Payer: Self-pay

## 2022-11-30 ENCOUNTER — Other Ambulatory Visit (HOSPITAL_COMMUNITY): Payer: Self-pay

## 2022-11-30 MED ORDER — SPIRONOLACTONE 25 MG PO TABS
12.5000 mg | ORAL_TABLET | Freq: Every day | ORAL | 3 refills | Status: DC
  Filled 2022-11-30: qty 45, 90d supply, fill #0
  Filled 2023-01-08: qty 30, 60d supply, fill #1

## 2022-11-30 NOTE — Telephone Encounter (Signed)
Returned call to patient as requested,   Explained changes to patient with new diuretic which will help with the extra fluid and shortness of breath. Encouraged patient to watch weight and blood pressure over the weekend. Let her know it could take a few days for everything to head back towards normal, but just to keep [place] updated.

## 2022-11-30 NOTE — Addendum Note (Signed)
Addended by: Gerald Stabs on: 11/30/2022 09:02 AM   Modules accepted: Orders

## 2022-11-30 NOTE — Telephone Encounter (Signed)
Please advise 

## 2022-11-30 NOTE — Telephone Encounter (Signed)
Labs via [place] [facility] 11/20/22 creatinine 1.11, K 3.3  May start Spironolactone 12.'5mg'$  QD with BMP In 1 week.   If BP persistently <110/60 she needs to reduce her Valsartan to a half tablet.  If BP >110/60, continue same dose Valsartan.  Loel Dubonnet, NP

## 2022-12-05 ENCOUNTER — Telehealth: Payer: Self-pay | Admitting: Orthopaedic Surgery

## 2022-12-05 ENCOUNTER — Encounter: Payer: Self-pay | Admitting: Orthopaedic Surgery

## 2022-12-05 ENCOUNTER — Other Ambulatory Visit (HOSPITAL_COMMUNITY): Payer: Self-pay

## 2022-12-05 ENCOUNTER — Other Ambulatory Visit: Payer: Self-pay

## 2022-12-05 NOTE — Telephone Encounter (Signed)
Noted! Thank you

## 2022-12-05 NOTE — Telephone Encounter (Signed)
If she can have her repeat BMP tomorrow or Friday will allow [place] to make further changes if needed.   Recommend elevating lower extremities and wearing compression stockings to help with swelling.   Recommend scheduling OV within next 1-3 weeks  Loel Dubonnet, NP

## 2022-12-05 NOTE — Telephone Encounter (Signed)
Pt returned call to [facility] t inform her she would like left knee steroid injection. Pt phone number is  [phone]

## 2022-12-06 ENCOUNTER — Other Ambulatory Visit (HOSPITAL_COMMUNITY): Payer: Self-pay

## 2022-12-06 ENCOUNTER — Other Ambulatory Visit (HOSPITAL_BASED_OUTPATIENT_CLINIC_OR_DEPARTMENT_OTHER): Payer: Self-pay

## 2022-12-06 ENCOUNTER — Other Ambulatory Visit: Payer: Self-pay

## 2022-12-06 DIAGNOSIS — E119 Type 2 diabetes mellitus without complications: Secondary | ICD-10-CM | POA: Diagnosis not present

## 2022-12-06 MED ORDER — HYDROXYZINE HCL 10 MG PO TABS
10.0000 mg | ORAL_TABLET | Freq: Every day | ORAL | 0 refills | Status: DC
  Filled 2023-01-29: qty 30, 30d supply, fill #0

## 2022-12-07 ENCOUNTER — Other Ambulatory Visit (HOSPITAL_COMMUNITY)
Admission: RE | Admit: 2022-12-07 | Discharge: 2022-12-07 | Disposition: A | Discharge status: Home / Self Care | Payer: Commercial Managed Care - PPO | Source: Ambulatory Visit | Attending: Endocrinology | Admitting: Endocrinology

## 2022-12-07 ENCOUNTER — Other Ambulatory Visit (HOSPITAL_COMMUNITY): Payer: Self-pay

## 2022-12-07 ENCOUNTER — Encounter: Payer: Self-pay | Admitting: Physician Assistant

## 2022-12-07 ENCOUNTER — Ambulatory Visit: Payer: Commercial Managed Care - PPO | Admitting: Physician Assistant

## 2022-12-07 ENCOUNTER — Other Ambulatory Visit (HOSPITAL_BASED_OUTPATIENT_CLINIC_OR_DEPARTMENT_OTHER): Payer: Self-pay

## 2022-12-07 DIAGNOSIS — I10 Essential (primary) hypertension: Secondary | ICD-10-CM | POA: Diagnosis not present

## 2022-12-07 DIAGNOSIS — G43009 Migraine without aura, not intractable, without status migrainosus: Secondary | ICD-10-CM

## 2022-12-07 LAB — BASIC METABOLIC PANEL
Anion gap: 9 (ref 5–15)
BUN: 11 mg/dL (ref 6–20)
CO2: 23 mmol/L (ref 22–32)
Calcium: 8.7 mg/dL — ABNORMAL LOW (ref 8.9–10.3)
Chloride: 102 mmol/L (ref 98–111)
Creatinine, Ser: 0.93 mg/dL (ref 0.44–1.00)
GFR, Estimated: >60 mL/min (ref >60)
Glucose, Bld: 129 mg/dL — ABNORMAL HIGH (ref 70–99)
Potassium: 3.7 mmol/L (ref 3.5–5.1)
Sodium: 134 mmol/L — ABNORMAL LOW (ref 135–145)

## 2022-12-07 MED ORDER — GABAPENTIN 400 MG PO CAPS
400.0000 mg | ORAL_CAPSULE | Freq: Three times a day (TID) | ORAL | 2 refills | Status: DC
  Filled 2022-12-07 – 2023-03-30 (×3): qty 270, 90d supply, fill #0

## 2022-12-07 MED ORDER — REYVOW 100 MG PO TABS
200.0000 mg | ORAL_TABLET | ORAL | 5 refills | Status: DC | PRN
  Filled 2023-01-31: qty 16, 60d supply, fill #0
  Filled 2023-03-30: qty 16, 60d supply, fill #1
  Filled 2023-04-04: qty 16, 60d supply, fill #0

## 2022-12-07 MED ORDER — METOPROLOL SUCCINATE ER 50 MG PO TB24
50.0000 mg | ORAL_TABLET | Freq: Every day | ORAL | 1 refills | Status: DC
  Filled 2022-12-07: qty 90, 90d supply, fill #0
  Filled 2023-02-23: qty 90, 90d supply, fill #1

## 2022-12-07 MED ORDER — BACLOFEN 10 MG PO TABS
10.0000 mg | ORAL_TABLET | ORAL | 5 refills | Status: DC
  Filled 2022-12-07: qty 40, fill #0
  Filled 2023-01-08: qty 40, 13d supply, fill #0
  Filled 2023-01-29: qty 40, 13d supply, fill #1
  Filled 2023-05-02: qty 40, 13d supply, fill #2
  Filled 2023-06-07: qty 40, 13d supply, fill #3
  Filled 2023-07-15: qty 40, 13d supply, fill #4

## 2022-12-07 MED ORDER — PROMETHAZINE HCL 25 MG PO TABS
25.0000 mg | ORAL_TABLET | Freq: Four times a day (QID) | ORAL | 1 refills | Status: DC | PRN
  Filled 2022-12-07: qty 30, 8d supply, fill #0
  Filled 2023-01-08: qty 30, 8d supply, fill #1

## 2022-12-07 NOTE — Progress Notes (Signed)
Follow up : Still having headaches, also battling BP. Has been taking the cocktail per Santiago Glad and it still helps knock the headache out   History:  Julie Hansen is a 52 y.o. [id number] who presents to clinic today for followup of migraine.  Her blood pressure has been up and down.  She has started and stopped and changed medications several times trying to get stable.  She is still working on this with pcp.  She is using Reyvow, phenergan and baclofen all at once for migraine.  It does not knock her out but she can't use at work.  It does help. She feels like she was having a different kind of headache that was the result of elevations in bp as she is very sensitive to this.  She has had significant swelling of her extremities.  Migraine overall unchanged.  Anxiety not well controlled.     Number of days in the last 4 weeks with:  Severe headache: 5 Moderate headache: 3 Mild headache: 3  No headache: 17   Past Medical History:  Diagnosis Date   Acid reflux    Asthma    Diabetes mellitus without complication (HCC)    Hyperlipidemia    PCOS (polycystic ovarian syndrome)    PONV (postoperative nausea and vomiting)    Sleep apnea    uses cpap    Social History   Socioeconomic History   Marital status: Married    Spouse name: Not on file   Number of children: Not on file   Years of education: Not on file   Highest education level: Not on file  Occupational History   Not on file  Tobacco Use   Smoking status: Never    Passive exposure: Never   Smokeless tobacco: Never  Vaping Use   Vaping Use: Never used  Substance and Sexual Activity   Alcohol use: Yes    Comment: occas wine   Drug use: No   Sexual activity: Yes    Birth control/protection: None  Other Topics Concern   Not on file  Social History Narrative   Not on file   Social Determinants of Health   Financial Resource Strain: Not on file  Food Insecurity: Not on file  Transportation Needs: Not on file   Physical Activity: Not on file  Stress: Not on file  Social Connections: Not on file  Intimate Partner Violence: Not on file    Family History  Problem Relation Age of Onset   Cancer Mother    Hypertension Mother    Diabetes Mother    Breast cancer Mother 27   Cancer Maternal Grandmother    Breast cancer Maternal Grandmother 80   Cancer Father    Diabetes Father    Hypertension Father    Breast cancer Paternal Aunt 71   Breast cancer Paternal Aunt 52   Breast cancer Paternal Aunt 44    Allergies  Allergen Reactions   Macrodantin [Nitrofurantoin Macrocrystal] Itching   Carbamazepine Other (See Comments)    Vaginal discharge   Vraylar [Cariprazine Hcl] Itching    Current Outpatient Medications on File Prior to Visit  Medication Sig Dispense Refill   Accu-Chek FastClix Lancets MISC Use 4 times a day 400 each 4   albuterol (PROVENTIL) (2.5 MG/3ML) 0.083% nebulizer solution Take 2.5 mg by nebulization every 6 (six) hours as needed.     ALPRAZolam (XANAX) 0.25 MG tablet Take 1 tablet (0.25 mg total) by mouth daily. 30 tablet 2   atorvastatin (LIPITOR)  40 MG tablet Take one tablet (40 mg dose) by mouth daily. 90 tablet 3   baclofen (LIORESAL) 10 MG tablet TAKE 1 TABLET BY MOUTH DAILY AS NEEDED UP TO 3 TIMES DAILY 40 tablet 5   beclomethasone (QVAR REDIHALER) 40 MCG/ACT inhaler Inhale 2 puffs into the lungs 2 times daily 26.1 g 2   Botulinum Toxin Type A (BOTOX) 200 units SOLR Inject 200 Units as directed every 3 (three) months. 1 each 2   cetirizine (ZYRTEC ALLERGY) 10 MG tablet Take 1 tablet (10 mg total) by mouth daily. 30 tablet 0   Continuous Blood Gluc Sensor (FREESTYLE LIBRE 3 SENSOR) MISC Use as directed, replace every 14 days. 2 each 6   dapagliflozin propanediol (FARXIGA) 10 MG TABS tablet Take 1 tablet (10 mg total) by mouth daily. 90 tablet 5   gabapentin (NEURONTIN) 400 MG capsule Take 1 capsule (400 mg total) by mouth 3 (three) times daily. 270 capsule 2   glucose  blood test strip Use to check blood sugar 3 times a day 100 each 6   hydrOXYzine (ATARAX) 10 MG tablet Take 2 tablets (20 mg total) by mouth at bedtime. 180 tablet 0   hydrOXYzine (ATARAX) 10 MG tablet Take 1 tablet (10 mg total) by mouth daily. 30 tablet 0   insulin lispro (HUMALOG KWIKPEN) 100 UNIT/ML KwikPen INJECT 20 - 25 UNITS INTO THE SKIN 3 TIMES A DAY 15 mL 6   Lasmiditan Succinate (REYVOW) 100 MG TABS Take 2 tablets (200 mg) by mouth as needed for headache 16 tablet 5   linaclotide (LINZESS) 290 MCG CAPS capsule Take 1 capsule (290 mcg total) by mouth 20 minutes before breakfast 90 capsule 4   meloxicam (MOBIC) 15 MG tablet Take 1 tablet (15 mg total) by mouth daily with a meal. 90 tablet 2   metoprolol succinate (TOPROL XL) 25 MG 24 hr tablet Take 1 tablet (25 mg total) by mouth daily. 30 tablet 5   omeprazole (PRILOSEC) 40 MG capsule Take 1 capsule (40 mg total) by mouth daily. 90 capsule 1   promethazine (PHENERGAN) 25 MG tablet Take 1 tablet (25 mg total) by mouth every 6 (six) hours as needed. 30 tablet 1   QUEtiapine (SEROQUEL XR) 300 MG 24 hr tablet Take 2 tablets (600 mg total) by mouth at bedtime. 180 tablet 1   spironolactone (ALDACTONE) 25 MG tablet Take 0.5 tablets (12.5 mg total) by mouth daily. 45 tablet 3   tirzepatide (MOUNJARO) 15 MG/0.5ML Pen Inject 15 mg into the skin once a week. 6 mL 5   traMADol (ULTRAM) 50 MG tablet Take 1 tablet by mouth every 12 hours as needed. 60 tablet 2   valsartan (DIOVAN) 160 MG tablet Take 1 tablet (160 mg total) by mouth daily. 90 tablet 3   Vitamin D, Ergocalciferol, (DRISDOL) 1.25 MG (50000 UNIT) CAPS capsule TAKE ONE CAPSULE BY MOUTH TWO TIMES A WEEK 24 capsule 3   zolpidem (AMBIEN CR) 12.5 MG CR tablet Take 1 tablet (12.5 mg total) by mouth at bedtime as needed. 30 tablet 0   fluticasone (FLONASE) 50 MCG/ACT nasal spray USE 1 SPRAY INTO BOTH NOSTRILS DAILY. 16 g 0   zolpidem (AMBIEN CR) 12.5 MG CR tablet Take 1 tablet (12.5 mg total) by  mouth at bedtime. (fill 04/05/22) (Patient not taking: Reported on 12/07/2022) 90 tablet 0   zolpidem (AMBIEN CR) 12.5 MG CR tablet Take 1 tablet (12.5 mg total) by mouth at bedtime. (Patient not taking: Reported on  12/07/2022) 90 tablet 0   No current facility-administered medications on file prior to visit.     Review of Systems:  All pertinent positive/negative included in HPI, all other review of systems are negative   Objective:  Physical Exam BP 128/77   Pulse 88   Wt 230 lb (104.3 kg)   BMI 39.48 kg/m  CONSTITUTIONAL: Well-developed, well-nourished female in no acute distress.  EYES: EOM intact ENT: Normocephalic CARDIOVASCULAR: Regular rate  RESPIRATORY: Normal rate.  MUSCULOSKELETAL: Normal ROM SKIN: Warm, dry without erythema  NEUROLOGICAL: Alert, oriented, CN II-XII grossly intact, Appropriate balance PSYCH: Normal behavior, mood   Assessment & Plan:  Assessment: 1. Migraine without aura and without status migrainosus, not intractable      Plan: Increase metoprolol to 50mg  for migraine prevention.  May also help with BP and anxiety.  Will hold on other changes as pt is having to make other med changes for BP/swelling.   She is encouraged to see someone about beginning a preventive medication for anxiety which would help her to not need xanax and hydroxyzine as often. 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  Considering a change. Follow-up in 6 months or sooner PRN 44 minutes spent face to face during this patient encounter. Paticia Stack, PA-C 12/07/2022 9:17 AM

## 2022-12-10 ENCOUNTER — Other Ambulatory Visit (HOSPITAL_COMMUNITY): Payer: Self-pay

## 2022-12-10 MED ORDER — ALPRAZOLAM 0.5 MG PO TABS
ORAL_TABLET | ORAL | 0 refills | Status: DC
  Filled 2022-12-10: qty 30, 30d supply, fill #0

## 2022-12-20 ENCOUNTER — Other Ambulatory Visit (HOSPITAL_COMMUNITY): Payer: Self-pay

## 2022-12-20 ENCOUNTER — Other Ambulatory Visit: Payer: Self-pay

## 2022-12-21 ENCOUNTER — Other Ambulatory Visit (HOSPITAL_COMMUNITY): Payer: Self-pay

## 2022-12-21 MED ORDER — TRULANCE 3 MG PO TABS
3.0000 mg | ORAL_TABLET | ORAL | 5 refills | Status: DC | PRN
  Filled 2022-12-21 – 2023-02-04 (×6): qty 30, 30d supply, fill #0

## 2022-12-24 ENCOUNTER — Other Ambulatory Visit (HOSPITAL_COMMUNITY): Payer: Self-pay

## 2022-12-25 ENCOUNTER — Other Ambulatory Visit (HOSPITAL_COMMUNITY): Payer: Self-pay

## 2022-12-26 ENCOUNTER — Other Ambulatory Visit (HOSPITAL_COMMUNITY): Payer: Self-pay

## 2022-12-27 ENCOUNTER — Other Ambulatory Visit (HOSPITAL_COMMUNITY): Payer: Self-pay

## 2022-12-31 ENCOUNTER — Other Ambulatory Visit (HOSPITAL_COMMUNITY): Payer: Self-pay

## 2023-01-04 ENCOUNTER — Other Ambulatory Visit (HOSPITAL_COMMUNITY): Payer: Self-pay

## 2023-01-05 ENCOUNTER — Other Ambulatory Visit (HOSPITAL_COMMUNITY): Payer: Self-pay

## 2023-01-08 ENCOUNTER — Other Ambulatory Visit: Payer: Self-pay | Admitting: Physician Assistant

## 2023-01-08 ENCOUNTER — Other Ambulatory Visit: Payer: Self-pay

## 2023-01-08 ENCOUNTER — Other Ambulatory Visit (HOSPITAL_BASED_OUTPATIENT_CLINIC_OR_DEPARTMENT_OTHER): Payer: Self-pay

## 2023-01-08 ENCOUNTER — Other Ambulatory Visit (HOSPITAL_COMMUNITY): Payer: Self-pay

## 2023-01-08 DIAGNOSIS — I10 Essential (primary) hypertension: Secondary | ICD-10-CM

## 2023-01-08 MED ORDER — SPIRONOLACTONE 25 MG PO TABS
25.0000 mg | ORAL_TABLET | Freq: Every day | ORAL | 3 refills | Status: DC
  Filled 2023-01-08: qty 90, 90d supply, fill #0

## 2023-01-08 MED ORDER — OMEPRAZOLE 40 MG PO CPDR
40.0000 mg | DELAYED_RELEASE_CAPSULE | Freq: Every day | ORAL | 1 refills | Status: DC
  Filled 2023-01-08: qty 90, 90d supply, fill #0
  Filled 2023-09-14: qty 90, 90d supply, fill #1

## 2023-01-08 NOTE — Telephone Encounter (Signed)
Received secure chat from Gillian Shields, NP-okay to refill spiro  to [facility]. Rx to pharm.

## 2023-01-09 ENCOUNTER — Other Ambulatory Visit (HOSPITAL_COMMUNITY): Payer: Self-pay

## 2023-01-09 MED ORDER — INSULIN LISPRO (1 UNIT DIAL) 100 UNIT/ML (KWIKPEN)
20.0000 [IU] | PEN_INJECTOR | Freq: Three times a day (TID) | SUBCUTANEOUS | 6 refills | Status: DC
  Filled 2023-01-09: qty 15, 20d supply, fill #0
  Filled 2023-01-29: qty 15, 20d supply, fill #1
  Filled 2023-05-02: qty 15, 20d supply, fill #2
  Filled 2023-06-28: qty 15, 20d supply, fill #3
  Filled 2024-01-01: qty 15, 20d supply, fill #4

## 2023-01-09 MED ORDER — ALPRAZOLAM 0.5 MG PO TABS
0.5000 mg | ORAL_TABLET | Freq: Every evening | ORAL | 0 refills | Status: DC | PRN
  Filled 2023-01-09: qty 30, 30d supply, fill #0

## 2023-01-11 ENCOUNTER — Other Ambulatory Visit: Payer: Self-pay

## 2023-01-11 ENCOUNTER — Other Ambulatory Visit (HOSPITAL_COMMUNITY): Payer: Self-pay

## 2023-01-11 MED ORDER — POTASSIUM CHLORIDE CRYS ER 10 MEQ PO TBCR
10.0000 meq | EXTENDED_RELEASE_TABLET | Freq: Every day | ORAL | 3 refills | Status: DC
  Filled 2023-01-11 – 2023-01-12 (×2): qty 90, 90d supply, fill #0

## 2023-01-11 MED ORDER — AMLODIPINE-VALSARTAN-HCTZ 5-160-12.5 MG PO TABS
1.0000 | ORAL_TABLET | Freq: Every day | ORAL | 3 refills | Status: DC
  Filled 2023-01-11 – 2023-01-12 (×2): qty 90, 90d supply, fill #0

## 2023-01-12 ENCOUNTER — Other Ambulatory Visit (HOSPITAL_COMMUNITY): Payer: Self-pay

## 2023-01-12 MED ORDER — AMLODIPINE BESYLATE 5 MG PO TABS
5.0000 mg | ORAL_TABLET | Freq: Every day | ORAL | 3 refills | Status: DC
  Filled 2023-01-12 (×2): qty 90, 90d supply, fill #0

## 2023-01-12 MED ORDER — VALSARTAN-HYDROCHLOROTHIAZIDE 160-12.5 MG PO TABS
1.0000 | ORAL_TABLET | Freq: Every day | ORAL | 3 refills | Status: DC
  Filled 2023-01-12 (×2): qty 90, 90d supply, fill #0
  Filled 2023-04-10: qty 90, 90d supply, fill #1
  Filled 2023-08-03: qty 90, 90d supply, fill #2
  Filled 2023-09-14: qty 90, 90d supply, fill #0

## 2023-01-14 ENCOUNTER — Other Ambulatory Visit: Payer: Self-pay

## 2023-01-22 ENCOUNTER — Other Ambulatory Visit (HOSPITAL_COMMUNITY): Payer: Self-pay

## 2023-01-24 ENCOUNTER — Other Ambulatory Visit (HOSPITAL_COMMUNITY): Payer: Self-pay

## 2023-01-24 MED ORDER — IBUPROFEN 800 MG PO TABS
800.0000 mg | ORAL_TABLET | Freq: Three times a day (TID) | ORAL | 0 refills | Status: DC | PRN
  Filled 2023-01-24: qty 16, 6d supply, fill #0

## 2023-01-24 MED ORDER — LIDOCAINE VISCOUS HCL 2 % MT SOLN
OROMUCOSAL | 0 refills | Status: DC
  Filled 2023-01-24: qty 100, 2d supply, fill #0

## 2023-01-25 ENCOUNTER — Other Ambulatory Visit (HOSPITAL_COMMUNITY): Payer: Self-pay

## 2023-01-29 ENCOUNTER — Other Ambulatory Visit: Payer: Self-pay | Admitting: Physician Assistant

## 2023-01-29 ENCOUNTER — Other Ambulatory Visit (HOSPITAL_COMMUNITY): Payer: Self-pay

## 2023-01-29 ENCOUNTER — Telehealth: Payer: Self-pay | Admitting: Orthopaedic Surgery

## 2023-01-29 MED ORDER — TRAMADOL HCL 50 MG PO TABS
50.0000 mg | ORAL_TABLET | Freq: Two times a day (BID) | ORAL | 2 refills | Status: DC | PRN
  Filled 2023-01-29: qty 60, 30d supply, fill #0
  Filled 2023-03-15: qty 60, 30d supply, fill #1
  Filled 2023-05-03: qty 60, 30d supply, fill #2

## 2023-01-29 NOTE — Telephone Encounter (Signed)
sent 

## 2023-01-29 NOTE — Telephone Encounter (Signed)
Patient asking for refill Tramadol 50 mg

## 2023-01-30 ENCOUNTER — Other Ambulatory Visit (HOSPITAL_COMMUNITY): Payer: Self-pay

## 2023-01-30 ENCOUNTER — Other Ambulatory Visit: Payer: Self-pay

## 2023-01-30 MED ORDER — FREESTYLE LANCETS MISC
4 refills | Status: AC
  Filled 2023-01-30: qty 300, 75d supply, fill #0
  Filled 2023-05-24: qty 400, 90d supply, fill #0

## 2023-01-30 MED ORDER — HYDROXYZINE HCL 10 MG PO TABS
10.0000 mg | ORAL_TABLET | Freq: Every day | ORAL | 0 refills | Status: DC
  Filled 2023-03-15: qty 90, 30d supply, fill #0
  Filled 2023-06-07 – 2023-06-08 (×2): qty 90, 90d supply, fill #0

## 2023-01-30 MED ORDER — ZOLPIDEM TARTRATE ER 12.5 MG PO TBCR: 12.5000 mg | EXTENDED_RELEASE_TABLET | Freq: Every evening | ORAL | 0 refills | Status: DC | PRN

## 2023-01-30 MED ORDER — ALPRAZOLAM 0.5 MG PO TABS
0.5000 mg | ORAL_TABLET | Freq: Every evening | ORAL | 0 refills | Status: DC | PRN
  Filled 2023-07-26: qty 90, 90d supply, fill #0

## 2023-01-30 MED ORDER — QUETIAPINE FUMARATE ER 300 MG PO TB24
300.0000 mg | ORAL_TABLET | Freq: Every day | ORAL | 0 refills | Status: DC
  Filled 2023-01-31: qty 90, 90d supply, fill #0

## 2023-01-30 MED ORDER — ZOLPIDEM TARTRATE ER 12.5 MG PO TBCR
12.5000 mg | EXTENDED_RELEASE_TABLET | Freq: Every evening | ORAL | 0 refills | Status: DC | PRN
  Filled 2023-01-30: qty 3, 3d supply, fill #0
  Filled 2023-01-30: qty 27, 27d supply, fill #0

## 2023-01-31 ENCOUNTER — Other Ambulatory Visit (HOSPITAL_COMMUNITY): Payer: Self-pay

## 2023-01-31 ENCOUNTER — Other Ambulatory Visit (HOSPITAL_BASED_OUTPATIENT_CLINIC_OR_DEPARTMENT_OTHER): Payer: Self-pay

## 2023-01-31 ENCOUNTER — Other Ambulatory Visit: Payer: Self-pay

## 2023-02-01 ENCOUNTER — Other Ambulatory Visit (HOSPITAL_COMMUNITY): Payer: Self-pay

## 2023-02-01 ENCOUNTER — Other Ambulatory Visit (HOSPITAL_BASED_OUTPATIENT_CLINIC_OR_DEPARTMENT_OTHER): Payer: Self-pay

## 2023-02-01 ENCOUNTER — Other Ambulatory Visit: Payer: Self-pay

## 2023-02-01 MED ORDER — IBUPROFEN 800 MG PO TABS
800.0000 mg | ORAL_TABLET | Freq: Three times a day (TID) | ORAL | 0 refills | Status: DC | PRN
  Filled 2023-02-01 (×2): qty 16, 6d supply, fill #0

## 2023-02-04 ENCOUNTER — Other Ambulatory Visit (HOSPITAL_COMMUNITY): Payer: Self-pay

## 2023-02-04 NOTE — Progress Notes (Unsigned)
Office Visit Note   Patient: Julie Hansen           Date of Birth: 1971-09-14           MRN: [id number] Visit Date: 02/05/2023              Requested by: Eartha Inch, MD [address] [place],  [place] [address] PCP: Eartha Inch, MD   Assessment & Plan: Visit Diagnoses:  1. Primary osteoarthritis of left knee     Plan: Impression is left knee osteoarthritis and DJD.  Cortisone injection performed today.  She tolerates well.  She would like to get approval for viscosupplementation.  This patient is diagnosed with osteoarthritis of the knee(s).    Radiographs show evidence of joint space narrowing, osteophytes, subchondral sclerosis and/or subchondral cysts.  This patient has knee pain which interferes with functional and activities of daily living.    This patient has experienced inadequate response, adverse effects and/or intolerance with conservative treatments such as acetaminophen, NSAIDS, topical creams, physical therapy or regular exercise, knee bracing and/or weight loss.   This patient has experienced inadequate response or has a contraindication to intra articular steroid injections for at least 3 months.   This patient is not scheduled to have a total knee replacement within 6 months of starting treatment with viscosupplementation.  Follow-Up Instructions: No follow-ups on file.   Orders:  Orders Placed This Encounter  Procedures   Large Joint Inj   No orders of the defined types were placed in this encounter.     Procedures: Large Joint Inj: L knee on 02/05/2023 10:00 AM Details: 22 G needle Medications: 2 mL bupivacaine 0.5 %; 2 mL lidocaine 1 %; 40 mg methylPREDNISolone acetate 40 MG/ML Outcome: tolerated well, no immediate complications Patient was prepped and draped in the usual sterile fashion.       Clinical Data: No additional findings.   Subjective: Chief Complaint  Patient presents with   Left Knee -  Follow-up    Wants cortisone shot    HPI Julie Hansen returns today for follow-up on left knee DJD.  She is requesting another cortisone injection.  Her last 1 was about 7 months ago. Review of Systems   Objective: Vital Signs: There were no vitals taken for this visit.  Physical Exam  Ortho Exam Examination of left knee is unchanged. Specialty Comments:  No specialty comments available.  Imaging: No results found.   PMFS History: Patient Active Problem List   Diagnosis Date Noted   Primary osteoarthritis of left knee 03/19/2022   S/P arthroscopy of left knee 02/02/2022   Chronic pain of left knee 02/02/2022   Acute medial meniscal injury of knee, left, initial encounter 07/18/2021   Chronic migraine without aura without status migrainosus, not intractable 04/14/2021   Muscle spasm 06/20/2018   Unspecified dyspareunia (CODE) 03/20/2017   Amenorrhea 03/20/2017   Irritable bowel syndrome 01/01/2017   Status migrainosus 01/01/2017   Anxiety 01/01/2017   Vaginal discharge 12/17/2016   GERD (gastroesophageal reflux disease) 10/30/2016   Mixed hyperlipidemia 10/30/2016   Diabetes type 2, controlled (HCC) 09/12/2015   OSA (obstructive sleep apnea) 02/16/2015   Asthma 10/29/2011   Past Medical History:  Diagnosis Date   Acid reflux    Asthma    Diabetes mellitus without complication (HCC)    Hyperlipidemia    PCOS (polycystic ovarian syndrome)    PONV (postoperative nausea and vomiting)    Sleep apnea    uses  cpap    Family History  Problem Relation Age of Onset   Cancer Mother    Hypertension Mother    Diabetes Mother    Breast cancer Mother 13   Cancer Maternal Grandmother    Breast cancer Maternal Grandmother 62   Cancer Father    Diabetes Father    Hypertension Father    Breast cancer Paternal Aunt 46   Breast cancer Paternal Aunt 51   Breast cancer Paternal Aunt 27    Past Surgical History:  Procedure Laterality Date   BREAST EXCISIONAL BIOPSY Right  06/28/2020   papilloma   DILATION AND EVACUATION N/A 06/05/2017   Procedure: DILATATION AND EVACUATION;  Surgeon: Essie Hart, MD;  Location: [facility];  Service: Gynecology;  Laterality: N/A;   KNEE ARTHROSCOPY WITH MENISCAL REPAIR Left 09/20/2021   Procedure: LEFT KNEE PARTIAL MEDIAL MENISCECTOMY;  Surgeon: Tarry Kos, MD;  Location: [facility];  Service: Orthopedics;  Laterality: Left;   RADIOACTIVE SEED GUIDED EXCISIONAL BREAST BIOPSY Right 06/28/2020   Procedure: RIGHT RADIOACTIVE SEED GUIDED EXCISIONAL BREAST BIOPSY;  Surgeon: Emelia Loron, MD;  Location: [facility];  Service: General;  Laterality: Right;   WISDOM TOOTH EXTRACTION     Social History   Occupational History   Not on file  Tobacco Use   Smoking status: Never    Passive exposure: Never   Smokeless tobacco: Never  Vaping Use   Vaping Use: Never used  Substance and Sexual Activity   Alcohol use: Yes    Comment: occas wine   Drug use: No   Sexual activity: Yes    Birth control/protection: None

## 2023-02-05 ENCOUNTER — Telehealth: Payer: Self-pay

## 2023-02-05 ENCOUNTER — Ambulatory Visit: Payer: Commercial Managed Care - PPO | Admitting: Orthopaedic Surgery

## 2023-02-05 DIAGNOSIS — M1712 Unilateral primary osteoarthritis, left knee: Secondary | ICD-10-CM | POA: Diagnosis not present

## 2023-02-05 MED ORDER — LIDOCAINE HCL 1 % IJ SOLN
2.0000 mL | INTRAMUSCULAR | Status: AC | PRN
Start: 2023-02-05 — End: 2023-02-05
  Administered 2023-02-05: 2 mL

## 2023-02-05 MED ORDER — BUPIVACAINE HCL 0.5 % IJ SOLN
2.0000 mL | INTRAMUSCULAR | Status: AC | PRN
Start: 2023-02-05 — End: 2023-02-05
  Administered 2023-02-05: 2 mL via INTRA_ARTICULAR

## 2023-02-05 MED ORDER — METHYLPREDNISOLONE ACETATE 40 MG/ML IJ SUSP
40.0000 mg | INTRAMUSCULAR | Status: AC | PRN
Start: 2023-02-05 — End: 2023-02-05
  Administered 2023-02-05: 40 mg via INTRA_ARTICULAR

## 2023-02-05 NOTE — Telephone Encounter (Signed)
Please precert for left knee visco. Dr.Xu's patient. Thanks!  

## 2023-02-07 ENCOUNTER — Other Ambulatory Visit (HOSPITAL_COMMUNITY): Payer: Self-pay

## 2023-02-08 ENCOUNTER — Other Ambulatory Visit: Payer: Self-pay

## 2023-02-08 MED ORDER — LUBIPROSTONE 8 MCG PO CAPS
8.0000 ug | ORAL_CAPSULE | Freq: Two times a day (BID) | ORAL | 3 refills | Status: DC
  Filled 2023-02-15: qty 60, 30d supply, fill #0

## 2023-02-08 MED ORDER — QUETIAPINE FUMARATE ER 300 MG PO TB24
600.0000 mg | ORAL_TABLET | Freq: Every day | ORAL | 1 refills | Status: DC
  Filled 2023-03-29 (×3): qty 60, 30d supply, fill #0

## 2023-02-08 NOTE — Telephone Encounter (Signed)
VOB submitted for Monovisc, left knee 

## 2023-02-09 ENCOUNTER — Other Ambulatory Visit (HOSPITAL_COMMUNITY): Payer: Self-pay

## 2023-02-12 ENCOUNTER — Institutional Professional Consult (permissible substitution): Payer: Commercial Managed Care - PPO | Admitting: Primary Care

## 2023-02-15 ENCOUNTER — Other Ambulatory Visit (HOSPITAL_COMMUNITY): Payer: Self-pay

## 2023-02-15 ENCOUNTER — Other Ambulatory Visit: Payer: Self-pay

## 2023-02-23 ENCOUNTER — Other Ambulatory Visit (HOSPITAL_COMMUNITY): Payer: Self-pay

## 2023-02-26 ENCOUNTER — Other Ambulatory Visit (HOSPITAL_COMMUNITY): Payer: Self-pay

## 2023-02-26 DIAGNOSIS — I10 Essential (primary) hypertension: Secondary | ICD-10-CM | POA: Diagnosis not present

## 2023-02-26 DIAGNOSIS — E119 Type 2 diabetes mellitus without complications: Secondary | ICD-10-CM | POA: Diagnosis not present

## 2023-02-26 DIAGNOSIS — G4700 Insomnia, unspecified: Secondary | ICD-10-CM | POA: Diagnosis not present

## 2023-02-26 DIAGNOSIS — Z01419 Encounter for gynecological examination (general) (routine) without abnormal findings: Secondary | ICD-10-CM | POA: Diagnosis not present

## 2023-02-26 DIAGNOSIS — E7800 Pure hypercholesterolemia, unspecified: Secondary | ICD-10-CM | POA: Diagnosis not present

## 2023-02-26 DIAGNOSIS — F419 Anxiety disorder, unspecified: Secondary | ICD-10-CM | POA: Diagnosis not present

## 2023-02-27 ENCOUNTER — Other Ambulatory Visit: Payer: Self-pay

## 2023-02-27 ENCOUNTER — Other Ambulatory Visit (HOSPITAL_COMMUNITY): Payer: Self-pay

## 2023-02-27 MED ORDER — ALPRAZOLAM 0.5 MG PO TABS
ORAL_TABLET | ORAL | 0 refills | Status: DC
  Filled 2023-02-27: qty 90, 90d supply, fill #0

## 2023-02-27 MED ORDER — INSULIN DEGLUDEC 200 UNIT/ML ~~LOC~~ SOPN
PEN_INJECTOR | SUBCUTANEOUS | 5 refills | Status: DC
  Filled 2023-02-27: qty 45, 75d supply, fill #0
  Filled 2024-01-01: qty 45, 75d supply, fill #1

## 2023-03-04 ENCOUNTER — Ambulatory Visit: Payer: Commercial Managed Care - PPO

## 2023-03-04 ENCOUNTER — Other Ambulatory Visit: Payer: Self-pay | Admitting: Endocrinology

## 2023-03-04 ENCOUNTER — Other Ambulatory Visit (HOSPITAL_COMMUNITY): Payer: Self-pay

## 2023-03-04 DIAGNOSIS — Z1231 Encounter for screening mammogram for malignant neoplasm of breast: Secondary | ICD-10-CM

## 2023-03-05 ENCOUNTER — Institutional Professional Consult (permissible substitution): Payer: Commercial Managed Care - PPO | Admitting: Primary Care

## 2023-03-06 ENCOUNTER — Other Ambulatory Visit (HOSPITAL_COMMUNITY): Payer: Self-pay

## 2023-03-06 ENCOUNTER — Telehealth: Payer: Self-pay

## 2023-03-06 MED ORDER — ZOLPIDEM TARTRATE ER 12.5 MG PO TBCR
EXTENDED_RELEASE_TABLET | ORAL | 0 refills | Status: DC
  Filled 2023-03-06: qty 30, 30d supply, fill #0

## 2023-03-06 NOTE — Telephone Encounter (Signed)
PA previously faxed to [facility] at [phone].  PA still pending for Monovisc, left knee.

## 2023-03-07 DIAGNOSIS — G4733 Obstructive sleep apnea (adult) (pediatric): Secondary | ICD-10-CM | POA: Diagnosis not present

## 2023-03-08 ENCOUNTER — Telehealth: Payer: Self-pay

## 2023-03-08 DIAGNOSIS — M1712 Unilateral primary osteoarthritis, left knee: Secondary | ICD-10-CM

## 2023-03-08 NOTE — Telephone Encounter (Signed)
Called and a left a Vm for patient to CB to schedule for gel injection with Dr. Roda Shutters or Mardella Layman.  See referrals tab

## 2023-03-13 ENCOUNTER — Other Ambulatory Visit (HOSPITAL_COMMUNITY): Payer: Self-pay

## 2023-03-13 MED ORDER — FREESTYLE LIBRE 3 SENSOR MISC
3 refills | Status: DC
  Filled 2023-03-13: qty 6, 84d supply, fill #0
  Filled 2023-05-03 – 2023-05-24 (×2): qty 6, 84d supply, fill #1
  Filled 2023-08-10: qty 2, 28d supply, fill #2
  Filled 2023-08-21 – 2023-09-02 (×2): qty 2, 28d supply, fill #3
  Filled 2023-10-17: qty 2, 28d supply, fill #4
  Filled 2023-11-07: qty 6, 84d supply, fill #5
  Filled 2023-11-07 – 2023-11-08 (×4): qty 2, 28d supply, fill #5
  Filled 2024-01-01: qty 2, 28d supply, fill #6
  Filled 2024-02-05: qty 2, 28d supply, fill #7

## 2023-03-14 ENCOUNTER — Other Ambulatory Visit (HOSPITAL_COMMUNITY): Payer: Self-pay

## 2023-03-14 ENCOUNTER — Other Ambulatory Visit (HOSPITAL_COMMUNITY)
Admission: RE | Admit: 2023-03-14 | Discharge: 2023-03-14 | Disposition: A | Discharge status: Home / Self Care | Payer: Commercial Managed Care - PPO | Source: Ambulatory Visit | Attending: Family Medicine | Admitting: Family Medicine

## 2023-03-14 DIAGNOSIS — I10 Essential (primary) hypertension: Secondary | ICD-10-CM | POA: Insufficient documentation

## 2023-03-14 LAB — BASIC METABOLIC PANEL
Anion gap: 10 (ref 5–15)
BUN: 10 mg/dL (ref 6–20)
CO2: 25 mmol/L (ref 22–32)
Calcium: 9 mg/dL (ref 8.9–10.3)
Chloride: 102 mmol/L (ref 98–111)
Creatinine, Ser: 0.77 mg/dL (ref 0.44–1.00)
GFR, Estimated: >60 mL/min (ref >60)
Glucose, Bld: 134 mg/dL — ABNORMAL HIGH (ref 70–99)
Potassium: 3.2 mmol/L — ABNORMAL LOW (ref 3.5–5.1)
Sodium: 137 mmol/L (ref 135–145)

## 2023-03-15 ENCOUNTER — Other Ambulatory Visit: Payer: Self-pay

## 2023-03-15 ENCOUNTER — Other Ambulatory Visit (HOSPITAL_COMMUNITY): Payer: Self-pay

## 2023-03-15 ENCOUNTER — Encounter (HOSPITAL_BASED_OUTPATIENT_CLINIC_OR_DEPARTMENT_OTHER): Payer: Self-pay | Admitting: Cardiology

## 2023-03-15 ENCOUNTER — Ambulatory Visit (HOSPITAL_BASED_OUTPATIENT_CLINIC_OR_DEPARTMENT_OTHER): Payer: Commercial Managed Care - PPO | Admitting: Cardiology

## 2023-03-15 ENCOUNTER — Other Ambulatory Visit: Payer: Self-pay | Admitting: Physician Assistant

## 2023-03-15 VITALS — BP 103/67 | HR 76 | Ht 64.0 in | Wt 230.1 lb

## 2023-03-15 DIAGNOSIS — G43009 Migraine without aura, not intractable, without status migrainosus: Secondary | ICD-10-CM

## 2023-03-15 DIAGNOSIS — I479 Paroxysmal tachycardia, unspecified: Secondary | ICD-10-CM | POA: Diagnosis not present

## 2023-03-15 DIAGNOSIS — R0789 Other chest pain: Secondary | ICD-10-CM

## 2023-03-15 DIAGNOSIS — R002 Palpitations: Secondary | ICD-10-CM | POA: Diagnosis not present

## 2023-03-15 DIAGNOSIS — E782 Mixed hyperlipidemia: Secondary | ICD-10-CM | POA: Diagnosis not present

## 2023-03-15 DIAGNOSIS — I10 Essential (primary) hypertension: Secondary | ICD-10-CM | POA: Diagnosis not present

## 2023-03-15 MED ORDER — PROMETHAZINE HCL 25 MG PO TABS
25.0000 mg | ORAL_TABLET | Freq: Four times a day (QID) | ORAL | 1 refills | Status: DC | PRN
Start: 2023-03-15 — End: 2023-06-07
  Filled 2023-03-15: qty 30, 8d supply, fill #0
  Filled 2023-05-02: qty 30, 8d supply, fill #1

## 2023-03-15 MED ORDER — BOTOX 200 UNITS IJ SOLR
200.0000 [IU] | INTRAMUSCULAR | 2 refills | Status: DC
  Filled 2023-03-15: qty 1, 90d supply, fill #0
  Filled 2023-03-26: qty 1, 84d supply, fill #0
  Filled 2023-07-15: qty 1, 84d supply, fill #1
  Filled ????-??-??: fill #0

## 2023-03-15 MED ORDER — HYDROXYZINE HCL 10 MG PO TABS
10.0000 mg | ORAL_TABLET | Freq: Three times a day (TID) | ORAL | 0 refills | Status: DC | PRN
  Filled 2023-03-15 (×2): qty 90, 15d supply, fill #0

## 2023-03-15 NOTE — Progress Notes (Signed)
Cardiology Office Note:  .   Date:  03/15/2023  ID:  Julie Hansen, DOB 1971/03/29, MRN [id number] PCP: Eartha Inch, MD  [facility] Providers Cardiologist:  Jodelle Red, MD {  History of Present Illness: .   Julie Hansen is a 52 y.o. female with a hx of headache, breast masses who is seen for follow-up. I initially met her 07/27/2020 as a new consult for shortness of breath and tachycardia/palpitations.   Prior cardiac history: filled up with fluid after her son was born. Had an echo at that time.  Prior workup: had ETT done at [facility] 05/15/2019. CT coronary 2022 with cadrads 0, Ca score 0. Monitor and echo done 2022, 2024.  Today: Has had ups and downs. Blood pressure was low when she saw Dr. Talmage Nap; stopped hydrochlorothiazide, continued valsartan. Ended up with LE edema, went back to hydrochlorothiazide-valsartan and had potassium added. Stopped spironolactone at that time. Now currently taking amlodipine, dapagliflozin, metoprolol, valsartan-hydrochlorothiazide. Checks BP at home every night before meds, has been 80s-120s systolic. Not usually low more than 2 days in a row.  Three days a week, in the evening, she feels chest pressure. Usually after she takes meds in the evening, pressure occurs after this. Can last up to 30 minutes. Tries to reposition/stretch. Hasn't trialed antacid or gas-x. Also notes occasional bursts of heart racing, sometimes over 100 bpm.   ROS: Denies chest pain, shortness of breath at rest or with normal exertion. No PND, orthopnea, LE edema or unexpected weight gain. No syncope or palpitations. ROS otherwise negative except as noted.   Studies Reviewed: Marland Kitchen    EKG:  EKG Interpretation  Date/Time:  Friday March 15 2023 08:32:47 EDT Ventricular Rate:  76 PR Interval:  160 QRS Duration: 74 QT Interval:  414 QTC Calculation: 465 R Axis:   17 Text Interpretation: Normal sinus rhythm low septal forces (cited on or before  07-Oct-2014) When compared with ECG of 07-Oct-2014 13:27, No significant change was found Confirmed by Jodelle Red [phone]) on 03/15/2023 8:40:58 AM    Physical Exam:   VS:  BP 103/67 (BP Location: Right Arm, Patient Position: Sitting, Cuff Size: Large)   Pulse 76   Ht 5\' 4"  (1.626 m)   Wt 230 lb 1.6 oz (104.4 kg)   BMI 39.50 kg/m    Wt Readings from Last 3 Encounters:  03/15/23 230 lb 1.6 oz (104.4 kg)  12/07/22 230 lb (104.3 kg)  08/24/22 231 lb 6.4 oz (105 kg)    GEN: Well nourished, well developed in no acute distress HEENT: Normal, moist mucous membranes NECK: No JVD CARDIAC: regular rhythm, normal S1 and S2, no rubs or gallops. No murmur. VASCULAR: Radial and DP pulses 2+ bilaterally. No carotid bruits RESPIRATORY:  Clear to auscultation without rales, wheezing or rhonchi  ABDOMEN: Soft, non-tender, non-distended MUSCULOSKELETAL:  Ambulates independently SKIN: Warm and dry, no edema NEUROLOGIC:  Alert and oriented x 3. No focal neuro deficits noted. PSYCHIATRIC:  Normal affect    ASSESSMENT AND PLAN: .   Chest pain Tachycardia Palpitations Shortness of breath -Echo without significant abnormalities, only grade 1 diastolic dysfunction.  -Monitor without significant arrhythmias, symptoms were sinus with rare ectopy. Has been checked twice -CT coronary 2022 with cadrads 0, Ca score 0 -recommended trialing treating GI symptoms (gas-x, antacid) and seeing what other things tend to help -reviewed red flag warning signs that need medical attention  Hypertension -has been running more low than high -low today in the office -would stop  amlodipine and monitor -continue valsartan-hydrochlorothiazide (has significant LE edema when hydrochlorothiazide stopped) -continue metoprolol -on dapagliflozin as well, make sure to avoid over diuresis  Type II diabetes -managed per Dr. Talmage Nap -on insulin, SGLT2i, GLP1 (mounjaro x 2 years) -on statin for primary prevention, though  no CAD  CV risk counseling and prevention -recommend heart healthy/Mediterranean diet, with whole grains, fruits, vegetable, fish, lean meats, nuts, and olive oil. Limit salt. -recommend moderate walking, 3-5 times/week for 30-50 minutes each session. Aim for at least 150 minutes.week. Goal should be pace of 3 miles/hours, or walking 1.5 miles in 30 minutes -recommend avoidance of tobacco products. Avoid excess alcohol. -ASCVD risk score: The 10-year ASCVD risk score (Arnett DK, et al., 2019) is: 3.2%   Values used to calculate the score:     Age: 59 years     Sex: Female     Is Non-Hispanic African American: Yes     Diabetic: Yes     Tobacco smoker: No     Systolic Blood Pressure: 103 mmHg     Is BP treated: Yes     HDL Cholesterol: 54 mg/dL     Total Cholesterol: 183 mg/dL    Dispo:  will contact me if BP significantly elevated (130s-140s) off amlodipine. If doing well, follow up in 1 year.  Signed, Jodelle Red, MD   Jodelle Red, MD, PhD, [facility]  [facility]  [place]  Heart & Vascular at [facility] at [facility] [address] [place], [place] [address] [phone]

## 2023-03-15 NOTE — Patient Instructions (Addendum)
Medication Instructions:  STOP AMLODIPINE   *If you need a refill on your cardiac medications before your next appointment, please call your pharmacy*  Lab Work: NONE  Testing/Procedures: NONE  Follow-Up: At [facility], you and your health needs are our priority.  As part of our continuing mission to provide you with exceptional heart care, we have created designated Provider Care Teams.  These Care Teams include your primary Cardiologist (physician) and Advanced Practice Providers (APPs -  Physician Assistants and Nurse Practitioners) who all work together to provide you with the care you need, when you need it.  We recommend signing up for the patient portal called "MyChart".  Sign up information is provided on this After Visit Summary.  MyChart is used to connect with patients for Virtual Visits (Telemedicine).  Patients are able to view lab/test results, encounter notes, upcoming appointments, etc.  Non-urgent messages can be sent to your provider as well.   To learn more about what you can do with MyChart, go to [email or web address].    Your next appointment:   12 month(s)  Provider:   Jodelle Red, MD or Gillian Shields, NP    Other Instructions  Contact me if BP significantly elevated (130s-140s) off amlodipine. Also let me know if you have consistent lows (<110 and definitely <100) that lasts several days in a row.

## 2023-03-17 NOTE — Progress Notes (Signed)
6//25/24- 51 yoF never smoker for sleep evaluation courtesy of Dr Cyndia Bent with concern of OSA Medical problem list includes Migraine, Asthma, GERD, IBS, DM2, Hyperlipidemia,  Epworth score-7 Body weight today- Sleep study 2010-2012. ? Dr Blenda Nicely. On CPAP [facility] Started CPAP 4 yrs ago after severe flu. C/O mask leak. Snores through her nasal mask.  Had ENT procedure for sinus infection, possibly septoplasty in the past.  Wakes tired.  Taking Seroquel and Ambien 12.5 mg CR.  Little caffeine.   I cannot tell from her that she is aware of waking much at night- maybe 2 or 3 times.  Estimates bedtime between 9 and 11 PM, needing 30 to 90 minutes to fall asleep and waking about 6:30 AM.  Has gained weight in the last few years. She thinks current machine is about 51 years old..  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 Had asthma only with a respiratory infection.  Denies heart problems.  Has not used inhalers in 3 years  Prior to Admission medications   Medication Sig Start Date End Date Taking? Authorizing Provider  albuterol (PROVENTIL) (2.5 MG/3ML) 0.083% nebulizer solution Take 2.5 mg by nebulization every 6 (six) hours as needed.   Yes [provider]  ALPRAZolam (XANAX) 0.5 MG tablet Take 1 tablet (0.5 mg total) by mouth at bedtime as needed. Max 1 tablet per day. 01/29/23  Yes   atorvastatin (LIPITOR) 40 MG tablet Take one tablet (40 mg dose) by mouth daily. 05/20/22  Yes   baclofen (LIORESAL) 10 MG tablet Take 1 tablet (10 mg total) by mouth up to three times daily as needed. 12/07/22  Yes Teague Edwena Blow, PA-C  beclomethasone (QVAR REDIHALER) 40 MCG/ACT inhaler Inhale 2 puffs into the lungs 2 times daily 04/11/20  Yes   botulinum toxin Type A (BOTOX) 200 units injection Inject 200 Units into the muscle every 3 (three) months. 03/15/23  Yes Glyn Ade, Scot Jun, PA-C  cetirizine (ZYRTEC ALLERGY) 10 MG tablet Take 1 tablet (10 mg total) by  mouth daily. 09/20/20  Yes Lamptey, Britta Mccreedy, MD  Continuous Glucose Sensor (FREESTYLE LIBRE 3 SENSOR) MISC Inject into the skin, change every 14 days 03/13/23  Yes   dapagliflozin propanediol (FARXIGA) 10 MG TABS tablet Take 1 tablet (10 mg total) by mouth daily. 08/14/22  Yes   gabapentin (NEURONTIN) 400 MG capsule Take 1 capsule (400 mg total) by mouth 3 (three) times daily. 12/07/22  Yes Teague Edwena Blow, PA-C  glucose blood test strip Use to check blood sugar 3 times a day 03/30/22  Yes   hydrOXYzine (ATARAX) 10 MG tablet hydroxyzine HCl 10 mg tablet 01/29/23  Yes   hydrOXYzine (ATARAX) 10 MG tablet Take 1-2 tablets (10-20 mg total) by mouth 3 (three) times daily as needed for anxiety. 03/15/23  Yes   ibuprofen (ADVIL) 800 MG tablet Take 1 tablet (800 mg total) by mouth every 8 (eight) hours as needed. 12/31/22  Yes   insulin degludec (TRESIBA) 200 UNIT/ML FlexTouch Pen Inject 45 units into the skin in the morning and 74 units into the skin in the evening. 02/26/23  Yes   insulin lispro (HUMALOG) 100 UNIT/ML KwikPen Inject 20-25 Units into the skin 3 (three) times daily. 01/09/23  Yes   Lancets (FREESTYLE) lancets Use to check blood sugar 4 times a day 01/29/23  Yes   Lasmiditan Succinate (REYVOW) 100 MG TABS Take 2 tablets (200 mg) by mouth as needed  for headache 12/07/22  Yes Teague Edwena Blow, PA-C  linaclotide [facility]) 290 MCG CAPS capsule Take 1 capsule (290 mcg total) by mouth 20 minutes before breakfast 03/06/22  Yes   lubiprostone (AMITIZA) 8 MCG capsule Take 1 capsule (8 mcg total) by mouth 2 (two) times daily with a meal. 02/07/23  Yes   meloxicam (MOBIC) 15 MG tablet Take 1 tablet (15 mg total) by mouth daily with a meal. 11/03/22  Yes   metoprolol succinate (TOPROL-XL) 50 MG 24 hr tablet Take 1 tablet (50 mg total) by mouth daily. Take with or immediately following a meal. 12/07/22  Yes Teague Edwena Blow, PA-C  omeprazole (PRILOSEC) 40 MG capsule Take 1 capsule (40 mg total) by mouth  daily. 01/08/23  Yes   potassium chloride (KLOR-CON M) 10 MEQ tablet Take 1 tablet (10 mEq total) by mouth daily. 01/11/23  Yes   promethazine (PHENERGAN) 25 MG tablet Take 1 tablet (25 mg total) by mouth every 6 (six) hours as needed. 03/15/23  Yes Teague Edwena Blow, PA-C  QUEtiapine (SEROQUEL XR) 300 MG 24 hr tablet Take 2 tablets (600 mg total) by mouth at bedtime. 02/07/23  Yes   tirzepatide (MOUNJARO) 15 MG/0.5ML Pen Inject 15 mg into the skin once a week. 08/14/22  Yes   traMADol (ULTRAM) 50 MG tablet Take 1 tablet by mouth every 12 hours as needed. 01/29/23  Yes Cristie Hem, PA-C  valsartan-hydrochlorothiazide (DIOVAN-HCT) 160-12.5 MG tablet Take 1 tablet by mouth daily. 01/11/23  Yes   Vitamin D, Ergocalciferol, (DRISDOL) 1.25 MG (50000 UNIT) CAPS capsule TAKE ONE CAPSULE BY MOUTH TWO TIMES A WEEK 03/06/22  Yes   zolpidem (AMBIEN CR) 12.5 MG CR tablet Take 1 tablet (12.5 mg total) by mouth at bedtime as needed. 03/06/23  Yes   ALPRAZolam (XANAX) 0.5 MG tablet Take 1 tablet (0.5 mg total) by mouth at bedtime as needed. Max daily amount 0.5 mg 03/26/23     omeprazole (PRILOSEC) 40 MG capsule Take 1 capsule (40 mg total) by mouth daily. 03/26/23     Plecanatide (TRULANCE) 3 MG TABS Take 1 tablet (3 mg total) by mouth daily. 03/26/23     Potassium Chloride ER 20 MEQ TBCR Take 1 tablet (20 mEq) by mouth daily. 03/27/23     QUEtiapine (SEROQUEL XR) 300 MG 24 hr tablet Take 2 tablets (600 mg total) by mouth at bedtime. 03/26/23     TRULANCE 3 MG TABS Take one tablet (3 mg dose) by mouth as needed. 03/21/23     zolpidem (AMBIEN CR) 12.5 MG CR tablet Take 1 tablet (12.5 mg total) by mouth at bedtime as needed. Max daily amount: 12.5mg  03/26/23      Past Medical History:  Diagnosis Date   Acid reflux    Asthma    Diabetes mellitus without complication (HCC)    Hyperlipidemia    PCOS (polycystic ovarian syndrome)    PONV (postoperative nausea and vomiting)    Sleep apnea    uses cpap   Past Surgical  History:  Procedure Laterality Date   BREAST EXCISIONAL BIOPSY Right 06/28/2020   papilloma   DILATION AND EVACUATION N/A 06/05/2017   Procedure: DILATATION AND EVACUATION;  Surgeon: Essie Hart, MD;  Location: [facility];  Service: Gynecology;  Laterality: N/A;   KNEE ARTHROSCOPY WITH MENISCAL REPAIR Left 09/20/2021   Procedure: LEFT KNEE PARTIAL MEDIAL MENISCECTOMY;  Surgeon: Tarry Kos, MD;  Location:  SURGERY CENTER;  Service: Orthopedics;  Laterality:  Left;   RADIOACTIVE SEED GUIDED EXCISIONAL BREAST BIOPSY Right 06/28/2020   Procedure: RIGHT RADIOACTIVE SEED GUIDED EXCISIONAL BREAST BIOPSY;  Surgeon: Emelia Loron, MD;  Location: [facility];  Service: General;  Laterality: Right;   WISDOM TOOTH EXTRACTION     Family History  Problem Relation Age of Onset   Cancer Mother    Hypertension Mother    Diabetes Mother    Breast cancer Mother 63   Cancer Maternal Grandmother    Breast cancer Maternal Grandmother 38   Cancer Father    Diabetes Father    Hypertension Father    Breast cancer Paternal Aunt 81   Breast cancer Paternal Aunt 66   Breast cancer Paternal Aunt 32   Social History   Socioeconomic History   Marital status: Married    Spouse name: Not on file   Number of children: Not on file   Years of education: Not on file   Highest education level: Not on file  Occupational History   Not on file  Tobacco Use   Smoking status: Never    Passive exposure: Never   Smokeless tobacco: Never  Vaping Use   Vaping Use: Never used  Substance and Sexual Activity   Alcohol use: Yes    Comment: occas wine   Drug use: No   Sexual activity: Yes    Birth control/protection: None  Other Topics Concern   Not on file  Social History Narrative   Not on file   Social Determinants of Health   Financial Resource Strain: Not on file  Food Insecurity: Not on file  Transportation Needs: Not on file  Physical Activity: Not on file  Stress: Not on file   Social Connections: Not on file  Intimate Partner Violence: Not on file   ROS-see HPI   + = positive Constitutional:    weight loss, night sweats, fevers, chills, fatigue, lassitude. HEENT:    headaches, difficulty swallowing, tooth/dental problems, sore throat,       sneezing, itching, ear ache, nasal congestion, post nasal drip, snoring CV:    chest pain, orthopnea, PND, swelling in lower extremities, anasarca,                                  dizziness, palpitations Resp:   shortness of breath with exertion or at rest.                productive cough,   non-productive cough, coughing up of blood.              change in color of mucus.  wheezing.   Skin:    rash or lesions. GI:  No-   heartburn, indigestion, abdominal pain, nausea, vomiting, diarrhea,                 change in bowel habits, loss of appetite GU: dysuria, change in color of urine, no urgency or frequency.   flank pain. MS:   joint pain, stiffness, decreased range of motion, back pain. Neuro-     nothing unusual Psych:  change in mood or affect.  depression or anxiety.   memory loss.  OBJ- Physical Exam General- Alert, Oriented, Affect-appropriate, Distress- none acute, + obese Skin- rash-none, lesions- none, excoriation- none Lymphadenopathy- none Head- atraumatic            Eyes- Gross vision intact, PERRLA, conjunctivae and secretions clear  Ears- Hearing, canals-normal            Nose- Clear, no-Septal dev, mucus, polyps, erosion, perforation             Throat- Mallampati IV , mucosa clear , drainage- none, tonsils- atrophic, +teeth Neck- flexible , trachea midline, no stridor , thyroid nl, carotid no bruit Chest - symmetrical excursion , unlabored           Heart/CV- RRR , no murmur , no gallop  , no rub, nl s1 s2                           - JVD- none , edema- none, stasis changes- none, varices- none           Lung- clear to P&A, wheeze- none, cough- none , dullness-none, rub- none           Chest  wall-  Abd-  Br/ Gen/ Rectal- Not done, not indicated Extrem- cyanosis- none, clubbing, none, atrophy- none, strength- nl Neuro- grossly intact to observation

## 2023-03-18 ENCOUNTER — Other Ambulatory Visit (HOSPITAL_COMMUNITY): Payer: Self-pay

## 2023-03-19 ENCOUNTER — Ambulatory Visit (INDEPENDENT_AMBULATORY_CARE_PROVIDER_SITE_OTHER): Payer: Commercial Managed Care - PPO | Admitting: Internal Medicine

## 2023-03-19 ENCOUNTER — Encounter: Payer: Self-pay | Admitting: Internal Medicine

## 2023-03-19 VITALS — BP 100/60 | HR 91 | Ht 64.0 in | Wt 230.6 lb

## 2023-03-19 DIAGNOSIS — G4733 Obstructive sleep apnea (adult) (pediatric): Secondary | ICD-10-CM

## 2023-03-19 DIAGNOSIS — J4520 Mild intermittent asthma, uncomplicated: Secondary | ICD-10-CM

## 2023-03-19 NOTE — Patient Instructions (Signed)
Order- DME [facility]   please change CPAP to autopap 5-20, mask of choice, humidifier, supplies, Airview/ card

## 2023-03-21 ENCOUNTER — Encounter (HOSPITAL_COMMUNITY): Payer: Self-pay

## 2023-03-21 ENCOUNTER — Other Ambulatory Visit (HOSPITAL_COMMUNITY): Payer: Self-pay

## 2023-03-21 MED ORDER — TRULANCE 3 MG PO TABS
3.0000 mg | ORAL_TABLET | Freq: Every day | ORAL | 5 refills | Status: DC | PRN
  Filled 2023-03-21 – 2023-09-03 (×2): qty 30, 30d supply, fill #0

## 2023-03-22 ENCOUNTER — Other Ambulatory Visit (HOSPITAL_COMMUNITY): Payer: Self-pay

## 2023-03-25 ENCOUNTER — Other Ambulatory Visit (HOSPITAL_COMMUNITY): Payer: Self-pay

## 2023-03-25 ENCOUNTER — Other Ambulatory Visit: Payer: Self-pay

## 2023-03-26 ENCOUNTER — Encounter: Payer: Self-pay | Admitting: Physician Assistant

## 2023-03-26 ENCOUNTER — Ambulatory Visit
Admission: RE | Admit: 2023-03-26 | Discharge: 2023-03-26 | Disposition: A | Discharge status: Home / Self Care | Payer: Commercial Managed Care - PPO | Source: Ambulatory Visit | Attending: Endocrinology | Admitting: Endocrinology

## 2023-03-26 ENCOUNTER — Other Ambulatory Visit (HOSPITAL_COMMUNITY): Payer: Self-pay

## 2023-03-26 ENCOUNTER — Other Ambulatory Visit: Payer: Self-pay

## 2023-03-26 ENCOUNTER — Ambulatory Visit: Payer: Commercial Managed Care - PPO | Admitting: Physician Assistant

## 2023-03-26 VITALS — Ht 64.0 in | Wt 230.0 lb

## 2023-03-26 DIAGNOSIS — Z1231 Encounter for screening mammogram for malignant neoplasm of breast: Secondary | ICD-10-CM

## 2023-03-26 DIAGNOSIS — M1712 Unilateral primary osteoarthritis, left knee: Secondary | ICD-10-CM | POA: Diagnosis not present

## 2023-03-26 MED ORDER — ALPRAZOLAM 0.5 MG PO TABS
0.5000 mg | ORAL_TABLET | Freq: Every evening | ORAL | 0 refills | Status: DC | PRN
  Filled 2023-03-26 – 2023-05-31 (×4): qty 90, 90d supply, fill #0

## 2023-03-26 MED ORDER — BUPIVACAINE HCL 0.25 % IJ SOLN
2.0000 mL | INTRAMUSCULAR | Status: AC | PRN
Start: 2023-03-26 — End: 2023-03-26
  Administered 2023-03-26: 2 mL via INTRA_ARTICULAR

## 2023-03-26 MED ORDER — QUETIAPINE FUMARATE ER 300 MG PO TB24
600.0000 mg | ORAL_TABLET | Freq: Every day | ORAL | 1 refills | Status: DC
  Filled 2023-03-26: qty 180, 180d supply, fill #0

## 2023-03-26 MED ORDER — ZOLPIDEM TARTRATE ER 12.5 MG PO TBCR
12.5000 mg | EXTENDED_RELEASE_TABLET | Freq: Every evening | ORAL | 0 refills | Status: DC | PRN
  Filled 2023-03-26 – 2023-04-03 (×4): qty 90, 90d supply, fill #0

## 2023-03-26 MED ORDER — HYALURONAN 88 MG/4ML IX SOSY
88.0000 mg | PREFILLED_SYRINGE | INTRA_ARTICULAR | Status: AC | PRN
Start: 2023-03-26 — End: 2023-03-26
  Administered 2023-03-26: 88 mg via INTRA_ARTICULAR

## 2023-03-26 MED ORDER — TRULANCE 3 MG PO TABS
3.0000 mg | ORAL_TABLET | Freq: Every day | ORAL | 6 refills | Status: AC
  Filled 2023-03-26 – 2023-04-15 (×3): qty 30, 30d supply, fill #0
  Filled 2023-10-28: qty 30, 30d supply, fill #1
  Filled 2023-12-07: qty 30, 30d supply, fill #2
  Filled 2024-01-01: qty 30, 30d supply, fill #3
  Filled 2024-01-31: qty 30, 30d supply, fill #4

## 2023-03-26 MED ORDER — OMEPRAZOLE 40 MG PO CPDR
40.0000 mg | DELAYED_RELEASE_CAPSULE | Freq: Every day | ORAL | 1 refills | Status: DC
  Filled 2023-03-26 – 2023-04-03 (×4): qty 90, 90d supply, fill #0
  Filled 2023-06-28: qty 90, 90d supply, fill #1

## 2023-03-26 MED ORDER — LIDOCAINE HCL 1 % IJ SOLN
2.0000 mL | INTRAMUSCULAR | Status: AC | PRN
Start: 2023-03-26 — End: 2023-03-26
  Administered 2023-03-26: 2 mL

## 2023-03-26 NOTE — Progress Notes (Signed)
Office Visit Note   Patient: Julie Hansen           Date of Birth: Jan 14, 1971           MRN: [id number] Visit Date: 03/26/2023              Requested by: Tarry Kos, MD [address] [place],  [place] [address] PCP: Eartha Inch, MD   Assessment & Plan: Visit Diagnoses:  1. Unilateral primary osteoarthritis, left knee     Plan: Impression is left knee osteoarthritis.  Today, we proceeded with left knee Monovisc injection.  She tolerated this well.  She will follow-up with [place] as needed. Expiration date 10/19/2025 Lot #[id number]  Follow-Up Instructions: Return if symptoms worsen or fail to improve.   Orders:  Orders Placed This Encounter  Procedures   Large Joint Inj: L knee   No orders of the defined types were placed in this encounter.     Procedures: Large Joint Inj: L knee on 03/26/2023 8:50 AM Indications: pain Details: 22 G needle, anterolateral approach Medications: 2 mL lidocaine 1 %; 2 mL bupivacaine 0.25 %; 88 mg Hyaluronan 88 MG/4ML      Clinical Data: No additional findings.   Subjective: Chief Complaint  Patient presents with   Left Knee - Follow-up    monovisc    HPI patient is a pleasant 52 year old female who comes in today with left knee osteoarthritis.  She is here for Monovisc injection to the left knee.  No previous gel injection to the left knee.  Previous cortisone injections have not provided long-term relief.     Objective: Vital Signs: There were no vitals taken for this visit.    Ortho Exam stable left knee exam  Specialty Comments:  No specialty comments available.  Imaging: No new imaging   PMFS History: Patient Active Problem List   Diagnosis Date Noted   Primary osteoarthritis of left knee 03/19/2022   S/P arthroscopy of left knee 02/02/2022   Chronic pain of left knee 02/02/2022   Acute medial meniscal injury of knee, left, initial encounter 07/18/2021   Chronic migraine without aura  without status migrainosus, not intractable 04/14/2021   Muscle spasm 06/20/2018   Unspecified dyspareunia (CODE) 03/20/2017   Amenorrhea 03/20/2017   Irritable bowel syndrome 01/01/2017   Status migrainosus 01/01/2017   Anxiety 01/01/2017   Vaginal discharge 12/17/2016   GERD (gastroesophageal reflux disease) 10/30/2016   Mixed hyperlipidemia 10/30/2016   Diabetes type 2, controlled (HCC) 09/12/2015   OSA (obstructive sleep apnea) 02/16/2015   Asthma 10/29/2011   Past Medical History:  Diagnosis Date   Acid reflux    Asthma    Diabetes mellitus without complication (HCC)    Hyperlipidemia    PCOS (polycystic ovarian syndrome)    PONV (postoperative nausea and vomiting)    Sleep apnea    uses cpap    Family History  Problem Relation Age of Onset   Cancer Mother    Hypertension Mother    Diabetes Mother    Breast cancer Mother 87   Cancer Maternal Grandmother    Breast cancer Maternal Grandmother 28   Cancer Father    Diabetes Father    Hypertension Father    Breast cancer Paternal Aunt 55   Breast cancer Paternal Aunt 47   Breast cancer Paternal Aunt 43    Past Surgical History:  Procedure Laterality Date   BREAST EXCISIONAL BIOPSY Right 06/28/2020   papilloma   DILATION AND EVACUATION N/A  06/05/2017   Procedure: DILATATION AND EVACUATION;  Surgeon: Essie Hart, MD;  Location: [facility];  Service: Gynecology;  Laterality: N/A;   KNEE ARTHROSCOPY WITH MENISCAL REPAIR Left 09/20/2021   Procedure: LEFT KNEE PARTIAL MEDIAL MENISCECTOMY;  Surgeon: Tarry Kos, MD;  Location: [facility];  Service: Orthopedics;  Laterality: Left;   RADIOACTIVE SEED GUIDED EXCISIONAL BREAST BIOPSY Right 06/28/2020   Procedure: RIGHT RADIOACTIVE SEED GUIDED EXCISIONAL BREAST BIOPSY;  Surgeon: Emelia Loron, MD;  Location: [facility];  Service: General;  Laterality: Right;   WISDOM TOOTH EXTRACTION     Social History   Occupational History   Not on file   Tobacco Use   Smoking status: Never    Passive exposure: Never   Smokeless tobacco: Never  Vaping Use   Vaping Use: Never used  Substance and Sexual Activity   Alcohol use: Yes    Comment: occas wine   Drug use: No   Sexual activity: Yes    Birth control/protection: None

## 2023-03-27 ENCOUNTER — Other Ambulatory Visit (HOSPITAL_COMMUNITY): Payer: Self-pay

## 2023-03-27 MED ORDER — POTASSIUM CHLORIDE ER 20 MEQ PO TBCR
1.0000 | EXTENDED_RELEASE_TABLET | Freq: Every day | ORAL | 3 refills | Status: DC
  Filled 2023-03-27: qty 90, 90d supply, fill #0
  Filled 2023-05-31 – 2023-06-10 (×2): qty 90, 90d supply, fill #1
  Filled 2023-09-24: qty 90, 90d supply, fill #2
  Filled 2024-01-01: qty 90, 90d supply, fill #3

## 2023-03-29 ENCOUNTER — Other Ambulatory Visit (HOSPITAL_COMMUNITY): Payer: Self-pay

## 2023-03-29 ENCOUNTER — Other Ambulatory Visit: Payer: Self-pay

## 2023-03-29 ENCOUNTER — Other Ambulatory Visit: Payer: Self-pay | Admitting: Endocrinology

## 2023-03-29 ENCOUNTER — Ambulatory Visit: Payer: Commercial Managed Care - PPO | Admitting: Physician Assistant

## 2023-03-29 DIAGNOSIS — R928 Other abnormal and inconclusive findings on diagnostic imaging of breast: Secondary | ICD-10-CM

## 2023-03-30 ENCOUNTER — Other Ambulatory Visit (HOSPITAL_BASED_OUTPATIENT_CLINIC_OR_DEPARTMENT_OTHER): Payer: Self-pay

## 2023-03-30 ENCOUNTER — Other Ambulatory Visit (HOSPITAL_COMMUNITY): Payer: Self-pay

## 2023-03-31 ENCOUNTER — Other Ambulatory Visit (HOSPITAL_BASED_OUTPATIENT_CLINIC_OR_DEPARTMENT_OTHER): Payer: Self-pay

## 2023-04-01 ENCOUNTER — Other Ambulatory Visit (HOSPITAL_COMMUNITY): Payer: Self-pay

## 2023-04-01 NOTE — Assessment & Plan Note (Signed)
She indicates this is mild and intermittent, mainly associated with occasional respiratory infection.  She has not needed to use her inhalers and 3 years.

## 2023-04-01 NOTE — Assessment & Plan Note (Signed)
We will try to get a copy of her original diagnostic sleep study.  Meanwhile, working through her [employer] DME company we will make some initial adjustments and then get a download to see where we are. Plan-ask DME company to refit mask for better fit and seal.  Change to AutoPap 5-20

## 2023-04-02 ENCOUNTER — Other Ambulatory Visit (HOSPITAL_COMMUNITY): Payer: Self-pay

## 2023-04-02 ENCOUNTER — Other Ambulatory Visit (HOSPITAL_BASED_OUTPATIENT_CLINIC_OR_DEPARTMENT_OTHER): Payer: Self-pay

## 2023-04-03 ENCOUNTER — Other Ambulatory Visit: Payer: Self-pay | Admitting: Endocrinology

## 2023-04-03 ENCOUNTER — Other Ambulatory Visit (HOSPITAL_BASED_OUTPATIENT_CLINIC_OR_DEPARTMENT_OTHER): Payer: Self-pay

## 2023-04-03 ENCOUNTER — Ambulatory Visit
Admission: RE | Admit: 2023-04-03 | Discharge: 2023-04-03 | Disposition: A | Discharge status: Home / Self Care | Payer: Commercial Managed Care - PPO | Source: Ambulatory Visit | Attending: Endocrinology | Admitting: Endocrinology

## 2023-04-03 ENCOUNTER — Encounter: Payer: Self-pay | Admitting: Internal Medicine

## 2023-04-03 ENCOUNTER — Other Ambulatory Visit (HOSPITAL_COMMUNITY): Payer: Self-pay

## 2023-04-03 DIAGNOSIS — N6002 Solitary cyst of left breast: Secondary | ICD-10-CM

## 2023-04-03 DIAGNOSIS — R928 Other abnormal and inconclusive findings on diagnostic imaging of breast: Secondary | ICD-10-CM

## 2023-04-03 DIAGNOSIS — N6489 Other specified disorders of breast: Secondary | ICD-10-CM

## 2023-04-04 ENCOUNTER — Other Ambulatory Visit (HOSPITAL_COMMUNITY): Payer: Self-pay | Admitting: Adult Health Nurse Practitioner

## 2023-04-04 ENCOUNTER — Other Ambulatory Visit: Payer: Self-pay

## 2023-04-04 ENCOUNTER — Encounter (HOSPITAL_COMMUNITY): Payer: Self-pay

## 2023-04-04 ENCOUNTER — Other Ambulatory Visit (HOSPITAL_COMMUNITY): Payer: Self-pay

## 2023-04-04 ENCOUNTER — Other Ambulatory Visit: Payer: Commercial Managed Care - PPO

## 2023-04-04 ENCOUNTER — Other Ambulatory Visit (HOSPITAL_BASED_OUTPATIENT_CLINIC_OR_DEPARTMENT_OTHER): Payer: Self-pay

## 2023-04-05 ENCOUNTER — Other Ambulatory Visit (HOSPITAL_BASED_OUTPATIENT_CLINIC_OR_DEPARTMENT_OTHER): Payer: Self-pay

## 2023-04-05 ENCOUNTER — Ambulatory Visit: Payer: Commercial Managed Care - PPO

## 2023-04-05 ENCOUNTER — Other Ambulatory Visit: Payer: Self-pay

## 2023-04-05 ENCOUNTER — Encounter: Payer: Self-pay | Admitting: *Deleted

## 2023-04-05 ENCOUNTER — Other Ambulatory Visit (HOSPITAL_COMMUNITY): Payer: Self-pay

## 2023-04-05 DIAGNOSIS — E7800 Pure hypercholesterolemia, unspecified: Secondary | ICD-10-CM | POA: Diagnosis not present

## 2023-04-05 DIAGNOSIS — E1165 Type 2 diabetes mellitus with hyperglycemia: Secondary | ICD-10-CM | POA: Diagnosis not present

## 2023-04-08 ENCOUNTER — Other Ambulatory Visit (HOSPITAL_COMMUNITY): Payer: Self-pay

## 2023-04-09 ENCOUNTER — Other Ambulatory Visit: Payer: Self-pay | Admitting: Endocrinology

## 2023-04-09 ENCOUNTER — Ambulatory Visit
Admission: RE | Admit: 2023-04-09 | Discharge: 2023-04-09 | Disposition: A | Discharge status: Home / Self Care | Payer: Commercial Managed Care - PPO | Source: Ambulatory Visit | Attending: Endocrinology | Admitting: Endocrinology

## 2023-04-09 DIAGNOSIS — N6320 Unspecified lump in the left breast, unspecified quadrant: Secondary | ICD-10-CM

## 2023-04-09 DIAGNOSIS — N6489 Other specified disorders of breast: Secondary | ICD-10-CM

## 2023-04-09 DIAGNOSIS — N6002 Solitary cyst of left breast: Secondary | ICD-10-CM

## 2023-04-09 DIAGNOSIS — I10 Essential (primary) hypertension: Secondary | ICD-10-CM | POA: Diagnosis not present

## 2023-04-09 DIAGNOSIS — E7800 Pure hypercholesterolemia, unspecified: Secondary | ICD-10-CM | POA: Diagnosis not present

## 2023-04-09 DIAGNOSIS — R928 Other abnormal and inconclusive findings on diagnostic imaging of breast: Secondary | ICD-10-CM

## 2023-04-09 DIAGNOSIS — R1310 Dysphagia, unspecified: Secondary | ICD-10-CM | POA: Diagnosis not present

## 2023-04-09 DIAGNOSIS — N6325 Unspecified lump in the left breast, overlapping quadrants: Secondary | ICD-10-CM | POA: Diagnosis not present

## 2023-04-09 DIAGNOSIS — K5730 Diverticulosis of large intestine without perforation or abscess without bleeding: Secondary | ICD-10-CM | POA: Diagnosis not present

## 2023-04-09 DIAGNOSIS — K219 Gastro-esophageal reflux disease without esophagitis: Secondary | ICD-10-CM | POA: Diagnosis not present

## 2023-04-09 DIAGNOSIS — Z86010 Personal history of colonic polyps: Secondary | ICD-10-CM | POA: Diagnosis not present

## 2023-04-09 DIAGNOSIS — E1165 Type 2 diabetes mellitus with hyperglycemia: Secondary | ICD-10-CM | POA: Diagnosis not present

## 2023-04-09 HISTORY — PX: BREAST BIOPSY: SHX20

## 2023-04-09 NOTE — Telephone Encounter (Signed)
Dr. Annamaria Boots, Please see patient message and advise.  Thank you.

## 2023-04-09 NOTE — Telephone Encounter (Signed)
When I saw her on June 25, she thought her current CPAP was about 51 years old. Insurance usually won't replace a machine until it is 51 years old. She can clarify her status with her home care company. I am happy to send an order  for replacement if they tell her she is eligible.  Insurance does not usually pay for CPAP cleaning devices. That is an out of pocket expense. We can discuss an updated sleep study when I see her next time in the office.

## 2023-04-10 ENCOUNTER — Other Ambulatory Visit (HOSPITAL_COMMUNITY): Payer: Self-pay

## 2023-04-10 ENCOUNTER — Other Ambulatory Visit (HOSPITAL_COMMUNITY): Payer: Self-pay | Admitting: Gastroenterology

## 2023-04-10 DIAGNOSIS — R1310 Dysphagia, unspecified: Secondary | ICD-10-CM

## 2023-04-11 ENCOUNTER — Other Ambulatory Visit: Payer: Self-pay

## 2023-04-11 ENCOUNTER — Other Ambulatory Visit (HOSPITAL_COMMUNITY): Payer: Self-pay

## 2023-04-15 ENCOUNTER — Other Ambulatory Visit (HOSPITAL_COMMUNITY): Payer: Self-pay

## 2023-04-15 ENCOUNTER — Other Ambulatory Visit: Payer: Self-pay

## 2023-04-16 ENCOUNTER — Other Ambulatory Visit (HOSPITAL_COMMUNITY): Payer: Self-pay

## 2023-04-17 ENCOUNTER — Ambulatory Visit
Admission: RE | Admit: 2023-04-17 | Discharge: 2023-04-17 | Disposition: A | Discharge status: Home / Self Care | Payer: Commercial Managed Care - PPO | Source: Ambulatory Visit | Attending: Endocrinology | Admitting: Endocrinology

## 2023-04-17 ENCOUNTER — Ambulatory Visit: Admission: RE | Admit: 2023-04-17 | Payer: Commercial Managed Care - PPO | Source: Ambulatory Visit

## 2023-04-17 DIAGNOSIS — N6489 Other specified disorders of breast: Secondary | ICD-10-CM

## 2023-04-17 DIAGNOSIS — R928 Other abnormal and inconclusive findings on diagnostic imaging of breast: Secondary | ICD-10-CM | POA: Diagnosis not present

## 2023-04-17 DIAGNOSIS — N6320 Unspecified lump in the left breast, unspecified quadrant: Secondary | ICD-10-CM

## 2023-04-17 HISTORY — PX: BREAST BIOPSY: SHX20

## 2023-04-18 ENCOUNTER — Other Ambulatory Visit (HOSPITAL_COMMUNITY): Payer: Self-pay

## 2023-04-19 ENCOUNTER — Encounter: Payer: Commercial Managed Care - PPO | Admitting: Physician Assistant

## 2023-04-26 ENCOUNTER — Other Ambulatory Visit (HOSPITAL_COMMUNITY): Payer: Self-pay

## 2023-04-26 ENCOUNTER — Encounter: Payer: Self-pay | Admitting: Physician Assistant

## 2023-04-26 ENCOUNTER — Other Ambulatory Visit: Payer: Self-pay

## 2023-04-26 ENCOUNTER — Ambulatory Visit: Payer: Commercial Managed Care - PPO | Admitting: Physician Assistant

## 2023-04-26 VITALS — BP 113/66 | HR 90 | Wt 228.0 lb

## 2023-04-26 DIAGNOSIS — M62838 Other muscle spasm: Secondary | ICD-10-CM

## 2023-04-26 DIAGNOSIS — G43709 Chronic migraine without aura, not intractable, without status migrainosus: Secondary | ICD-10-CM | POA: Diagnosis not present

## 2023-04-26 MED ORDER — ONABOTULINUMTOXINA 200 UNITS IJ SOLR
200.0000 [IU] | Freq: Once | INTRAMUSCULAR | Status: AC
  Administered 2023-04-26: 200 [IU] via INTRAMUSCULAR

## 2023-04-26 MED ORDER — REYVOW 100 MG PO TABS
200.0000 mg | ORAL_TABLET | ORAL | 5 refills | Status: DC | PRN
  Filled 2023-04-26 – 2023-05-31 (×2): qty 16, 60d supply, fill #0
  Filled 2023-07-27: qty 16, 60d supply, fill #1
  Filled 2023-09-24: qty 16, 60d supply, fill #2

## 2023-04-26 MED ORDER — METOPROLOL SUCCINATE ER 50 MG PO TB24
50.0000 mg | ORAL_TABLET | Freq: Every day | ORAL | 1 refills | Status: DC
  Filled 2023-04-26 – 2023-05-21 (×4): qty 90, 90d supply, fill #0

## 2023-04-26 NOTE — Progress Notes (Signed)
CC: Botox  S: Pt in office today for Botox injections.  This is her first of this type of procedure.  She reports no significant changes in medical history.     O: BP 113/66   Pulse 90   Wt 228 lb (103.4 kg)   BMI 39.14 kg/m    Botox Procedure Note Vial of Botox was : Sent to office specifically for this patient.  Botox Dosing by [facility] [facility] for Chronic Migraine   Injection Sites for Migraines  Botox 155 units was injected using the dosage in the table above in the pattern shown above. 45 units wasted  A: Migraine  Muscle spasm   P: Botox 155 units injected today.  Meds refilled as needed - no change RTC 3 Months.

## 2023-05-02 ENCOUNTER — Other Ambulatory Visit (HOSPITAL_COMMUNITY): Payer: Self-pay

## 2023-05-03 ENCOUNTER — Other Ambulatory Visit (HOSPITAL_COMMUNITY): Payer: Self-pay

## 2023-05-03 ENCOUNTER — Encounter: Payer: Commercial Managed Care - PPO | Admitting: Physician Assistant

## 2023-05-07 ENCOUNTER — Other Ambulatory Visit (HOSPITAL_COMMUNITY): Payer: Self-pay

## 2023-05-07 MED ORDER — QUETIAPINE FUMARATE ER 300 MG PO TB24
300.0000 mg | ORAL_TABLET | Freq: Every day | ORAL | 1 refills | Status: DC
  Filled 2023-05-07: qty 90, 90d supply, fill #0

## 2023-05-11 ENCOUNTER — Other Ambulatory Visit (HOSPITAL_COMMUNITY): Payer: Self-pay

## 2023-05-13 ENCOUNTER — Other Ambulatory Visit (HOSPITAL_COMMUNITY): Payer: Self-pay

## 2023-05-13 MED ORDER — HYDROXYZINE HCL 10 MG PO TABS
10.0000 mg | ORAL_TABLET | Freq: Three times a day (TID) | ORAL | 0 refills | Status: DC
  Filled 2023-05-13: qty 90, 15d supply, fill #0

## 2023-05-14 ENCOUNTER — Other Ambulatory Visit: Payer: Self-pay

## 2023-05-20 ENCOUNTER — Encounter: Payer: Self-pay | Admitting: Internal Medicine

## 2023-05-20 ENCOUNTER — Other Ambulatory Visit: Payer: Self-pay

## 2023-05-20 DIAGNOSIS — G4733 Obstructive sleep apnea (adult) (pediatric): Secondary | ICD-10-CM

## 2023-05-20 NOTE — Telephone Encounter (Signed)
Order- DME- please replace old CPAP machine, change to auto 5-20, mask of choice, heated humidifier, hoses, filters, supplies andd please install download capability- AirView/ card.Ok to send our last office note.

## 2023-05-21 ENCOUNTER — Other Ambulatory Visit (HOSPITAL_COMMUNITY): Payer: Self-pay

## 2023-05-21 DIAGNOSIS — I10 Essential (primary) hypertension: Secondary | ICD-10-CM | POA: Diagnosis not present

## 2023-05-21 DIAGNOSIS — E1165 Type 2 diabetes mellitus with hyperglycemia: Secondary | ICD-10-CM | POA: Diagnosis not present

## 2023-05-21 DIAGNOSIS — E7800 Pure hypercholesterolemia, unspecified: Secondary | ICD-10-CM | POA: Diagnosis not present

## 2023-05-21 MED ORDER — GLUCOSE BLOOD VI STRP
ORAL_STRIP | 4 refills | Status: AC
Start: 2023-05-21 — End: ?
  Filled 2023-05-21: qty 100, 33d supply, fill #0

## 2023-05-21 MED ORDER — ACCU-CHEK GUIDE W/DEVICE KIT
PACK | 0 refills | Status: AC
  Filled 2023-05-21: qty 1, 30d supply, fill #0

## 2023-05-24 ENCOUNTER — Other Ambulatory Visit (HOSPITAL_COMMUNITY): Payer: Self-pay

## 2023-05-24 ENCOUNTER — Other Ambulatory Visit: Payer: Self-pay

## 2023-05-28 ENCOUNTER — Other Ambulatory Visit: Payer: Self-pay

## 2023-05-31 ENCOUNTER — Other Ambulatory Visit (HOSPITAL_BASED_OUTPATIENT_CLINIC_OR_DEPARTMENT_OTHER): Payer: Self-pay

## 2023-05-31 ENCOUNTER — Other Ambulatory Visit (HOSPITAL_COMMUNITY): Payer: Self-pay

## 2023-06-03 ENCOUNTER — Other Ambulatory Visit (HOSPITAL_COMMUNITY): Payer: Self-pay

## 2023-06-04 ENCOUNTER — Other Ambulatory Visit: Payer: Self-pay

## 2023-06-04 ENCOUNTER — Other Ambulatory Visit (HOSPITAL_COMMUNITY): Payer: Self-pay

## 2023-06-04 DIAGNOSIS — M25571 Pain in right ankle and joints of right foot: Secondary | ICD-10-CM | POA: Diagnosis not present

## 2023-06-04 DIAGNOSIS — M25562 Pain in left knee: Secondary | ICD-10-CM | POA: Diagnosis not present

## 2023-06-04 MED ORDER — DICLOFENAC SODIUM 75 MG PO TBEC
75.0000 mg | DELAYED_RELEASE_TABLET | Freq: Two times a day (BID) | ORAL | 0 refills | Status: DC | PRN
  Filled 2023-06-04 (×2): qty 60, 30d supply, fill #0

## 2023-06-06 DIAGNOSIS — G4733 Obstructive sleep apnea (adult) (pediatric): Secondary | ICD-10-CM | POA: Diagnosis not present

## 2023-06-07 ENCOUNTER — Other Ambulatory Visit: Payer: Self-pay | Admitting: Physician Assistant

## 2023-06-07 ENCOUNTER — Other Ambulatory Visit (HOSPITAL_COMMUNITY): Payer: Self-pay

## 2023-06-07 DIAGNOSIS — G43009 Migraine without aura, not intractable, without status migrainosus: Secondary | ICD-10-CM

## 2023-06-08 ENCOUNTER — Other Ambulatory Visit (HOSPITAL_COMMUNITY): Payer: Self-pay

## 2023-06-08 MED ORDER — CYCLOBENZAPRINE HCL 10 MG PO TABS
10.0000 mg | ORAL_TABLET | Freq: Three times a day (TID) | ORAL | 0 refills | Status: DC | PRN
  Filled 2023-06-08: qty 21, 7d supply, fill #0

## 2023-06-10 ENCOUNTER — Other Ambulatory Visit (HOSPITAL_COMMUNITY): Payer: Self-pay

## 2023-06-10 ENCOUNTER — Other Ambulatory Visit: Payer: Self-pay

## 2023-06-10 MED ORDER — VITAMIN D (ERGOCALCIFEROL) 1.25 MG (50000 UNIT) PO CAPS
50000.0000 [IU] | ORAL_CAPSULE | ORAL | 3 refills | Status: DC
  Filled 2023-06-10: qty 24, 84d supply, fill #0
  Filled 2024-01-31: qty 24, 84d supply, fill #1
  Filled 2024-05-11: qty 24, 84d supply, fill #2

## 2023-06-10 MED ORDER — HYDROXYZINE HCL 10 MG PO TABS
10.0000 mg | ORAL_TABLET | Freq: Three times a day (TID) | ORAL | 0 refills | Status: DC | PRN
  Filled 2023-06-10 – 2023-07-15 (×2): qty 90, 15d supply, fill #0
  Filled ????-??-??: fill #0

## 2023-06-11 ENCOUNTER — Ambulatory Visit: Payer: Commercial Managed Care - PPO | Admitting: Orthopaedic Surgery

## 2023-06-11 ENCOUNTER — Other Ambulatory Visit (INDEPENDENT_AMBULATORY_CARE_PROVIDER_SITE_OTHER): Payer: Commercial Managed Care - PPO

## 2023-06-11 ENCOUNTER — Encounter: Payer: Self-pay | Admitting: Orthopaedic Surgery

## 2023-06-11 ENCOUNTER — Other Ambulatory Visit (HOSPITAL_COMMUNITY): Payer: Self-pay

## 2023-06-11 DIAGNOSIS — M25562 Pain in left knee: Secondary | ICD-10-CM | POA: Diagnosis not present

## 2023-06-11 DIAGNOSIS — M25571 Pain in right ankle and joints of right foot: Secondary | ICD-10-CM

## 2023-06-11 DIAGNOSIS — M1712 Unilateral primary osteoarthritis, left knee: Secondary | ICD-10-CM

## 2023-06-11 MED ORDER — HYDROCODONE-ACETAMINOPHEN 5-325 MG PO TABS
1.0000 | ORAL_TABLET | Freq: Every day | ORAL | 0 refills | Status: DC | PRN
  Filled 2023-06-11: qty 10, 5d supply, fill #0

## 2023-06-11 NOTE — Progress Notes (Signed)
Office Visit Note   Patient: Julie Hansen           Date of Birth: 08-29-1971           MRN: [id number] Visit Date: 06/11/2023              Requested by: Julie Inch, MD [address] [place],  [place] [address] PCP: Julie Inch, MD   Assessment & Plan: Visit Diagnoses:  1. Acute pain of left knee   2. Pain in right ankle and joints of right foot   3. Primary osteoarthritis of left knee     Plan: In regards to the right ankle impression is grade 2 sprain.  Recommend symptomatic treatment and RICE.  Will provide her with an ASO brace to transition to once her symptoms improved.  Activity as tolerated.  For the left knee impression is aggravation of underlying advanced DJD.  At this point she would like to make arrangements for left total knee replacement sometime around Thanksgiving.  Impression is severe left knee degenerative joint disease secondary to Osteoarthritis.  Bone on bone joint space narrowing is seen on radiographs with mild varus alignment.  At this point, conservative treatments fail to provide any significant relief and the pain is severely affecting ADLs and quality of life.  Based on treatment options, the patient has elected to move forward with a knee replacement.  We have discussed the surgical risks that include but are not limited to infection, DVT, leg length discrepancy, stiffness, numbness, tingling, incomplete relief of pain.  Recovery and prognosis were also reviewed.    Anticoagulants: No antithrombotic Postop anticoagulation: Aspirin 81 mg Diabetic: No  Nickel allergy: No Prior DVT/PE: No Tobacco use: No Clearances needed for surgery: None Anticipated discharge dispo: Home   Follow-Up Instructions: No follow-ups on file.   Orders:  Orders Placed This Encounter  Procedures   XR KNEE 3 VIEW LEFT   XR Ankle Complete Right   Meds ordered this encounter  Medications   HYDROcodone-acetaminophen (NORCO) 5-325 MG  tablet    Sig: Take 1-2 tablets by mouth daily as needed.    Dispense:  10 tablet    Refill:  0      Procedures: No procedures performed   Clinical Data: No additional findings.   Subjective: Chief Complaint  Patient presents with   Left Knee - Pain    HPI Julie Hansen is a 52 year old female who is well-known to me who comes in for evaluation of left knee pain and right ankle pain.  She had a fall last week when she missed 3 steps at home.  She is initially evaluated at the [facility] urgent care.  X-rays were negative.  She has been walking a crutch and a cam boot.  Her left knee has been swollen but has improved overall. Review of Systems  Constitutional: Negative.   HENT: Negative.    Eyes: Negative.   Respiratory: Negative.    Cardiovascular: Negative.   Endocrine: Negative.   Musculoskeletal: Negative.   Neurological: Negative.   Hematological: Negative.   Psychiatric/Behavioral: Negative.    All other systems reviewed and are negative.    Objective: Vital Signs: There were no vitals taken for this visit.  Physical Exam Vitals and nursing note reviewed.  Constitutional:      Appearance: She is well-developed.  HENT:     Head: Normocephalic and atraumatic.  Pulmonary:     Effort: Pulmonary effort is normal.  Abdominal:  Palpations: Abdomen is soft.  Musculoskeletal:     Cervical back: Neck supple.  Skin:    General: Skin is warm.     Capillary Refill: Capillary refill takes less than 2 seconds.  Neurological:     Mental Status: She is alert and oriented to person, place, and time.  Psychiatric:        Behavior: Behavior normal.        Thought Content: Thought content normal.        Judgment: Judgment normal.     Ortho Exam Exam of the left knee shows trace effusion.  No significant bony tenderness.  She is able to weight-bear.  Range of motion is at baseline.  Collaterals and cruciates are stable.  Exam of the right ankle shows moderate  swelling to the lateral aspect with tenderness to palpation.  No ecchymosis.  Minimal pain with stressing of the syndesmosis. Specialty Comments:  No specialty comments available.  Imaging: No results found.   PMFS History: Patient Active Problem List   Diagnosis Date Noted   Primary osteoarthritis of left knee 03/19/2022   S/P arthroscopy of left knee 02/02/2022   Chronic pain of left knee 02/02/2022   Acute medial meniscal injury of knee, left, initial encounter 07/18/2021   Chronic migraine without aura without status migrainosus, not intractable 04/14/2021   Muscle spasm 06/20/2018   Unspecified dyspareunia (CODE) 03/20/2017   Amenorrhea 03/20/2017   Irritable bowel syndrome 01/01/2017   Status migrainosus 01/01/2017   Anxiety 01/01/2017   Vaginal discharge 12/17/2016   GERD (gastroesophageal reflux disease) 10/30/2016   Mixed hyperlipidemia 10/30/2016   Diabetes type 2, controlled (HCC) 09/12/2015   OSA (obstructive sleep apnea) 02/16/2015   Asthma 10/29/2011   Past Medical History:  Diagnosis Date   Acid reflux    Asthma    Diabetes mellitus without complication (HCC)    Hyperlipidemia    PCOS (polycystic ovarian syndrome)    PONV (postoperative nausea and vomiting)    Sleep apnea    uses cpap    Family History  Problem Relation Age of Onset   Cancer Mother    Hypertension Mother    Diabetes Mother    Breast cancer Mother 62   Cancer Maternal Grandmother    Breast cancer Maternal Grandmother 21   Cancer Father    Diabetes Father    Hypertension Father    Breast cancer Paternal Aunt 90   Breast cancer Paternal Aunt 16   Breast cancer Paternal Aunt 57    Past Surgical History:  Procedure Laterality Date   BREAST BIOPSY Left 04/09/2023   BREAST BIOPSY Left 04/09/2023   [place] LT BREAST BX W LOC DEV 1ST LESION IMG BX SPEC US GUIDE 04/09/2023 [facility]   BREAST BIOPSY Left 04/17/2023   MM LT BREAST BX W LOC DEV 1ST LESION IMAGE BX SPEC STEREO GUIDE  04/17/2023 [facility]   BREAST EXCISIONAL BIOPSY Right 06/28/2020   papilloma   DILATION AND EVACUATION N/A 06/05/2017   Procedure: DILATATION AND EVACUATION;  Surgeon: Essie Hart, MD;  Location: [facility];  Service: Gynecology;  Laterality: N/A;   KNEE ARTHROSCOPY WITH MENISCAL REPAIR Left 09/20/2021   Procedure: LEFT KNEE PARTIAL MEDIAL MENISCECTOMY;  Surgeon: Tarry Kos, MD;  Location: [facility];  Service: Orthopedics;  Laterality: Left;   RADIOACTIVE SEED GUIDED EXCISIONAL BREAST BIOPSY Right 06/28/2020   Procedure: RIGHT RADIOACTIVE SEED GUIDED EXCISIONAL BREAST BIOPSY;  Surgeon: Emelia Loron, MD;  Location: [facility];  Service: General;  Laterality: Right;   WISDOM TOOTH EXTRACTION     Social History   Occupational History   Not on file  Tobacco Use   Smoking status: Never    Passive exposure: Never   Smokeless tobacco: Never  Vaping Use   Vaping status: Never Used  Substance and Sexual Activity   Alcohol use: Yes    Comment: occas wine   Drug use: No   Sexual activity: Yes    Birth control/protection: None

## 2023-06-12 DIAGNOSIS — G4733 Obstructive sleep apnea (adult) (pediatric): Secondary | ICD-10-CM | POA: Diagnosis not present

## 2023-06-14 ENCOUNTER — Other Ambulatory Visit (HOSPITAL_COMMUNITY): Payer: Self-pay

## 2023-06-14 ENCOUNTER — Other Ambulatory Visit (HOSPITAL_BASED_OUTPATIENT_CLINIC_OR_DEPARTMENT_OTHER): Payer: Self-pay

## 2023-06-14 MED ORDER — PROMETHAZINE HCL 25 MG PO TABS
25.0000 mg | ORAL_TABLET | Freq: Four times a day (QID) | ORAL | 1 refills | Status: DC | PRN
Start: 2023-06-14 — End: 2023-07-26
  Filled 2023-06-14: qty 30, 8d supply, fill #0
  Filled 2023-07-15 (×2): qty 30, 8d supply, fill #1

## 2023-06-15 ENCOUNTER — Encounter (HOSPITAL_COMMUNITY): Payer: Self-pay

## 2023-06-15 ENCOUNTER — Other Ambulatory Visit (HOSPITAL_COMMUNITY): Payer: Self-pay

## 2023-06-18 ENCOUNTER — Other Ambulatory Visit (HOSPITAL_COMMUNITY): Payer: Self-pay

## 2023-06-18 DIAGNOSIS — M25562 Pain in left knee: Secondary | ICD-10-CM | POA: Diagnosis not present

## 2023-06-18 MED ORDER — DICLOFENAC SODIUM 75 MG PO TBEC
75.0000 mg | DELAYED_RELEASE_TABLET | Freq: Two times a day (BID) | ORAL | 0 refills | Status: DC | PRN
  Filled 2023-06-18 – 2023-07-01 (×2): qty 60, 30d supply, fill #0

## 2023-06-25 ENCOUNTER — Telehealth: Payer: Self-pay | Admitting: Orthopaedic Surgery

## 2023-06-25 NOTE — Telephone Encounter (Signed)
[employer] forms received. To [employer]. 

## 2023-06-28 ENCOUNTER — Other Ambulatory Visit (HOSPITAL_COMMUNITY): Payer: Self-pay

## 2023-07-01 ENCOUNTER — Other Ambulatory Visit (HOSPITAL_COMMUNITY): Payer: Self-pay

## 2023-07-01 ENCOUNTER — Other Ambulatory Visit: Payer: Self-pay

## 2023-07-02 ENCOUNTER — Other Ambulatory Visit (HOSPITAL_COMMUNITY): Payer: Self-pay

## 2023-07-02 DIAGNOSIS — G4733 Obstructive sleep apnea (adult) (pediatric): Secondary | ICD-10-CM | POA: Diagnosis not present

## 2023-07-06 DIAGNOSIS — G4733 Obstructive sleep apnea (adult) (pediatric): Secondary | ICD-10-CM | POA: Diagnosis not present

## 2023-07-12 ENCOUNTER — Other Ambulatory Visit (HOSPITAL_COMMUNITY): Payer: Self-pay

## 2023-07-15 ENCOUNTER — Other Ambulatory Visit (HOSPITAL_COMMUNITY): Payer: Self-pay

## 2023-07-15 ENCOUNTER — Encounter: Payer: Self-pay | Admitting: Orthopaedic Surgery

## 2023-07-15 ENCOUNTER — Other Ambulatory Visit: Payer: Self-pay

## 2023-07-15 ENCOUNTER — Other Ambulatory Visit: Payer: Self-pay | Admitting: Physician Assistant

## 2023-07-15 ENCOUNTER — Other Ambulatory Visit (HOSPITAL_BASED_OUTPATIENT_CLINIC_OR_DEPARTMENT_OTHER): Payer: Self-pay

## 2023-07-15 MED ORDER — TRAMADOL HCL 50 MG PO TABS
50.0000 mg | ORAL_TABLET | Freq: Two times a day (BID) | ORAL | 2 refills | Status: DC | PRN
Start: 2023-07-15 — End: 2023-09-05
  Filled 2023-07-15: qty 60, 30d supply, fill #0
  Filled 2023-08-21: qty 60, 30d supply, fill #1

## 2023-07-15 MED ORDER — ZOLPIDEM TARTRATE ER 12.5 MG PO TBCR
12.5000 mg | EXTENDED_RELEASE_TABLET | Freq: Every evening | ORAL | 0 refills | Status: DC | PRN
Start: 2023-07-15 — End: 2023-08-30
  Filled 2023-07-15: qty 90, 90d supply, fill #0

## 2023-07-15 NOTE — Telephone Encounter (Signed)
sent 

## 2023-07-15 NOTE — Progress Notes (Signed)
Specialty Pharmacy Refill Coordination Note  Julie Hansen is a 52 y.o. female contacted today regarding refills of specialty medication(s) Onabotulinumtoxina   Patient requested Delivery   Delivery date: 07/16/23   Verified address: [facility] [employer] [employer]   Medication will be filled on 07/15/23.

## 2023-07-16 ENCOUNTER — Other Ambulatory Visit (HOSPITAL_COMMUNITY): Payer: Self-pay

## 2023-07-16 MED ORDER — ZOLPIDEM TARTRATE ER 12.5 MG PO TBCR
12.5000 mg | EXTENDED_RELEASE_TABLET | Freq: Every evening | ORAL | 0 refills | Status: DC | PRN
  Filled 2023-10-11: qty 90, 90d supply, fill #0

## 2023-07-19 ENCOUNTER — Encounter (HOSPITAL_COMMUNITY): Payer: Self-pay

## 2023-07-19 ENCOUNTER — Ambulatory Visit (HOSPITAL_COMMUNITY): Payer: Commercial Managed Care - PPO

## 2023-07-26 ENCOUNTER — Encounter: Payer: Self-pay | Admitting: Physician Assistant

## 2023-07-26 ENCOUNTER — Other Ambulatory Visit (HOSPITAL_COMMUNITY): Payer: Self-pay

## 2023-07-26 ENCOUNTER — Ambulatory Visit: Payer: Commercial Managed Care - PPO | Admitting: Physician Assistant

## 2023-07-26 DIAGNOSIS — G43009 Migraine without aura, not intractable, without status migrainosus: Secondary | ICD-10-CM | POA: Diagnosis not present

## 2023-07-26 MED ORDER — GABAPENTIN 400 MG PO CAPS
400.0000 mg | ORAL_CAPSULE | Freq: Three times a day (TID) | ORAL | 2 refills | Status: DC
  Filled 2023-07-26: qty 270, 90d supply, fill #0
  Filled 2023-11-06: qty 270, 90d supply, fill #1

## 2023-07-26 MED ORDER — PROMETHAZINE HCL 25 MG PO TABS
25.0000 mg | ORAL_TABLET | Freq: Four times a day (QID) | ORAL | 1 refills | Status: DC | PRN
  Filled 2023-07-26: qty 30, 8d supply, fill #0
  Filled 2023-09-24: qty 30, 8d supply, fill #1

## 2023-07-26 MED ORDER — IBUPROFEN 800 MG PO TABS
800.0000 mg | ORAL_TABLET | Freq: Three times a day (TID) | ORAL | 0 refills | Status: DC | PRN
  Filled 2023-07-26: qty 16, 6d supply, fill #0

## 2023-07-26 MED ORDER — CYCLOBENZAPRINE HCL 10 MG PO TABS
10.0000 mg | ORAL_TABLET | Freq: Three times a day (TID) | ORAL | 1 refills | Status: DC
  Filled 2023-07-26: qty 30, 10d supply, fill #0

## 2023-07-26 MED ORDER — IBUPROFEN 800 MG PO TABS
800.0000 mg | ORAL_TABLET | Freq: Three times a day (TID) | ORAL | 1 refills | Status: DC | PRN
  Filled 2023-07-26: qty 60, 20d supply, fill #0

## 2023-07-26 MED ORDER — METOPROLOL SUCCINATE ER 50 MG PO TB24
50.0000 mg | ORAL_TABLET | Freq: Every day | ORAL | 1 refills | Status: DC
  Filled 2023-07-26 – 2023-08-24 (×2): qty 90, 90d supply, fill #0
  Filled 2023-11-22: qty 90, 90d supply, fill #1

## 2023-07-26 MED ORDER — ONABOTULINUMTOXINA 200 UNITS IJ SOLR
200.0000 [IU] | Freq: Once | INTRAMUSCULAR | Status: AC
Start: 2023-07-26 — End: 2023-07-26
  Administered 2023-07-26: 200 [IU] via INTRAMUSCULAR

## 2023-07-26 MED ORDER — BACLOFEN 10 MG PO TABS
10.0000 mg | ORAL_TABLET | ORAL | 5 refills | Status: DC
  Filled 2023-07-26: qty 40, 13d supply, fill #0
  Filled 2023-08-21: qty 40, 13d supply, fill #1

## 2023-07-26 NOTE — Progress Notes (Signed)
CC: Botox today  S: Pt in office today for Botox injections. Her Botox worked well for migraine prevention already after only the first dose.  She is happy. She is currently walking with crutches as she fell at sprained her right ankle.  She is planning for knee replacement on the left soon.    O: BP 111/71   Pulse 91    Botox Procedure Note Vial of Botox was delivered to office and kept at appropriate refrigerated temp until patient arrived to office.   Botox Dosing by [facility] for Chronic Migraine   Injection Sites for Migraines  Botox 155 units was injected using the dosage in the table above in the pattern shown above.  A: Migraine  Muscle spasm   P: Botox 200 units injected today.  Meds refilled as requested EXCEPT [facility] which already had rx with 5 refills.    RTC 3 Months for Botox.

## 2023-07-26 NOTE — Patient Instructions (Signed)
Botulinum Toxin Cosmetic Injection Botulinum toxin injection is a shot in the skin that is given to soften lines and wrinkles in the face. The medicine works by weakening the muscles in the face, which reduces the wrinkles when you move your face. Tell a doctor about: Any allergies you have, especially if you are allergic to eggs or milk. All the medicines you take. This includes: Antibiotics. Vitamins. Herbs. Eye drops. Creams. Over-the-counter medicines. Any bleeding problems you have. Any surgeries you have had. Any medical conditions you have. Whether you are pregnant or may be pregnant. Whether you are breastfeeding. What are the risks? Your doctor will talk with you about risks. These may include: Pain in the jaw. Pain in the neck. Allergic reaction to the shot. Infection. Drooping eyebrow or eyelid. Not being able to close one or both eyes. Double vision. Blurry vision. Changes in voice or speech. Trouble swallowing. What happens before the procedure? Ask your doctor about changing or stopping: Your normal medicines. Vitamins, herbs, and supplements. Over-the-counter medicines. Do not take aspirin or ibuprofen unless you are told to. Do not drink alcohol if your doctor tells you not to drink. What happens during the procedure?  The area to be treated may be: Chilled with ice. Numbed with medicine. Your doctor will give you some shots of the toxin. How many shots you get depends on: Your condition. Which area is being treated. The procedure may vary among doctors and hospitals. What can I expect after the procedure? After these shots, it is common to have: Pain, soreness, swelling, itching, or redness on your face. Small bruises from the needle. Bumps or marks from the needle. Loss of feeling in areas where the needle entered the body. Headache. This is rare. It may take up to 14 days for the treatment to work. It may work sooner for some people. Results will  last for about 3-4 months. Follow-up treatment will make this last longer. Follow these instructions at home: Managing pain and discomfort  Take over-the-counter and prescription medicines only as told by your doctor. If told, put ice on the affected area: Put ice in a plastic bag. Place a towel between your skin and the bag. Leave the ice on for 20 minutes, 2-3 times per day. If your skin turns bright red, take off the ice right away to prevent skin damage. The risk of damage is higher if you cannot feel pain, heat, or cold. Activity Do not do activities that take a lot of effort. Avoid these activities for 12 hours after the procedure or for as long as told by your doctor. This includes: Not lifting heavy items. Not working out. Not doing activities that make your heart beat faster. General instructions Do not lie down for 4 hours after treatment or as told by your doctor. Do not rub the area where you got the shot. This can spread the toxin. Depending on where the shot was given: Your doctor may ask you not to move your muscles in that area. Your doctor may tell you to frown or squint regularly. You may need to do these exercises every 15 minutes for 1 hour after the shots. Follow what the doctor tells you. Do not get laser treatments, facials, or facial massages for 1-2 weeks after the procedure or for as long as told by your doctor. Contact a doctor if: You have a headache that gets worse. You have a fever. Your eyelids start to get droopy or swollen. You have trouble  speaking. You have trouble swallowing. Get help right away if: You have signs of an allergic reaction. These include: An itchy rash or welts. Wheezing. You feel short of breath or have trouble breathing. Dizziness. These symptoms may be an emergency. Get help right away. Call 911. Do not wait to see if the symptoms will go away. Do not drive yourself to the hospital. This information is not intended to replace  advice given to you by your health care provider. Make sure you discuss any questions you have with your health care provider. Document Revised: 05/21/2022 Document Reviewed: 05/21/2022 Elsevier Patient Education  2024 [employer].

## 2023-07-27 ENCOUNTER — Other Ambulatory Visit (HOSPITAL_COMMUNITY): Payer: Self-pay

## 2023-07-29 ENCOUNTER — Other Ambulatory Visit (HOSPITAL_COMMUNITY): Payer: Self-pay

## 2023-07-29 ENCOUNTER — Other Ambulatory Visit (HOSPITAL_COMMUNITY)
Admission: RE | Admit: 2023-07-29 | Discharge: 2023-07-29 | Disposition: A | Discharge status: Home / Self Care | Payer: Commercial Managed Care - PPO | Source: Ambulatory Visit | Attending: Adult Health Nurse Practitioner | Admitting: Adult Health Nurse Practitioner

## 2023-07-29 ENCOUNTER — Other Ambulatory Visit: Payer: Self-pay | Admitting: Adult Health Nurse Practitioner

## 2023-07-29 DIAGNOSIS — E119 Type 2 diabetes mellitus without complications: Secondary | ICD-10-CM | POA: Diagnosis not present

## 2023-07-29 DIAGNOSIS — E782 Mixed hyperlipidemia: Secondary | ICD-10-CM | POA: Diagnosis not present

## 2023-07-29 DIAGNOSIS — I10 Essential (primary) hypertension: Secondary | ICD-10-CM | POA: Diagnosis not present

## 2023-07-29 DIAGNOSIS — Z1159 Encounter for screening for other viral diseases: Secondary | ICD-10-CM | POA: Diagnosis not present

## 2023-07-29 DIAGNOSIS — Z794 Long term (current) use of insulin: Secondary | ICD-10-CM | POA: Insufficient documentation

## 2023-07-29 DIAGNOSIS — Z1321 Encounter for screening for nutritional disorder: Secondary | ICD-10-CM | POA: Insufficient documentation

## 2023-07-29 DIAGNOSIS — E559 Vitamin D deficiency, unspecified: Secondary | ICD-10-CM | POA: Insufficient documentation

## 2023-07-29 DIAGNOSIS — D649 Anemia, unspecified: Secondary | ICD-10-CM | POA: Diagnosis not present

## 2023-07-29 DIAGNOSIS — Z803 Family history of malignant neoplasm of breast: Secondary | ICD-10-CM

## 2023-07-29 LAB — CBC WITH DIFFERENTIAL/PLATELET
Abs Immature Granulocytes: 0.01 10*3/uL (ref 0.00–0.07)
Basophils Absolute: 0 10*3/uL (ref 0.0–0.1)
Basophils Relative: 0 %
Eosinophils Absolute: 0 10*3/uL (ref 0.0–0.5)
Eosinophils Relative: 1 %
HCT: 36.2 % (ref 36.0–46.0)
Hemoglobin: 11.3 g/dL — ABNORMAL LOW (ref 12.0–15.0)
Immature Granulocytes: 0 %
Lymphocytes Relative: 33 %
Lymphs Abs: 2.3 10*3/uL (ref 0.7–4.0)
MCH: 27.5 pg (ref 26.0–34.0)
MCHC: 31.2 g/dL (ref 30.0–36.0)
MCV: 88.1 fL (ref 80.0–100.0)
Monocytes Absolute: 0.4 10*3/uL (ref 0.1–1.0)
Monocytes Relative: 7 %
Neutro Abs: 4 10*3/uL (ref 1.7–7.7)
Neutrophils Relative %: 59 %
Platelets: 441 10*3/uL — ABNORMAL HIGH (ref 150–400)
RBC: 4.11 MIL/uL (ref 3.87–5.11)
RDW: 14.9 % (ref 11.5–15.5)
WBC: 6.8 10*3/uL (ref 4.0–10.5)
nRBC: 0 % (ref 0.0–0.2)

## 2023-07-29 LAB — HEMOGLOBIN A1C
Hgb A1c MFr Bld: 7.1 % — ABNORMAL HIGH (ref 4.8–5.6)
Mean Plasma Glucose: 157.07 mg/dL

## 2023-07-29 LAB — COMPREHENSIVE METABOLIC PANEL
ALT: 15 U/L (ref 0–44)
AST: 12 U/L — ABNORMAL LOW (ref 15–41)
Albumin: 3.2 g/dL — ABNORMAL LOW (ref 3.5–5.0)
Alkaline Phosphatase: 80 U/L (ref 38–126)
Anion gap: 5 (ref 5–15)
BUN: 12 mg/dL (ref 6–20)
CO2: 26 mmol/L (ref 22–32)
Calcium: 8.6 mg/dL — ABNORMAL LOW (ref 8.9–10.3)
Chloride: 104 mmol/L (ref 98–111)
Creatinine, Ser: 0.89 mg/dL (ref 0.44–1.00)
GFR, Estimated: >60 mL/min (ref >60)
Glucose, Bld: 106 mg/dL — ABNORMAL HIGH (ref 70–99)
Potassium: 3.8 mmol/L (ref 3.5–5.1)
Sodium: 135 mmol/L (ref 135–145)
Total Bilirubin: 0.2 mg/dL (ref <1.2)
Total Protein: 6.9 g/dL (ref 6.5–8.1)

## 2023-07-29 LAB — LIPID PANEL
Cholesterol: 177 mg/dL (ref 0–200)
HDL: 55 mg/dL (ref >40)
LDL Cholesterol: 99 mg/dL (ref 0–99)
Total CHOL/HDL Ratio: 3.2 {ratio}
Triglycerides: 114 mg/dL (ref <150)
VLDL: 23 mg/dL (ref 0–40)

## 2023-07-29 LAB — TSH: TSH: 2.243 u[IU]/mL (ref 0.350–4.500)

## 2023-07-29 LAB — VITAMIN D 25 HYDROXY (VIT D DEFICIENCY, FRACTURES): Vit D, 25-Hydroxy: 69.98 ng/mL (ref 30–100)

## 2023-07-29 LAB — VITAMIN B12: Vitamin B-12: 655 pg/mL (ref 180–914)

## 2023-07-29 LAB — HEPATITIS C ANTIBODY: HCV Ab: NONREACTIVE

## 2023-07-30 ENCOUNTER — Other Ambulatory Visit (HOSPITAL_COMMUNITY): Payer: Self-pay

## 2023-07-30 MED ORDER — ALPRAZOLAM 1 MG PO TABS
0.5000 mg | ORAL_TABLET | Freq: Two times a day (BID) | ORAL | 0 refills | Status: DC | PRN
  Filled 2023-07-30: qty 60, 30d supply, fill #0

## 2023-07-30 MED ORDER — MELOXICAM 15 MG PO TABS
15.0000 mg | ORAL_TABLET | Freq: Every day | ORAL | 1 refills | Status: DC
  Filled 2023-07-30 – 2023-08-21 (×3): qty 30, 30d supply, fill #0

## 2023-07-31 ENCOUNTER — Other Ambulatory Visit (HOSPITAL_COMMUNITY): Payer: Self-pay

## 2023-07-31 ENCOUNTER — Other Ambulatory Visit: Payer: Self-pay

## 2023-08-01 ENCOUNTER — Other Ambulatory Visit (HOSPITAL_BASED_OUTPATIENT_CLINIC_OR_DEPARTMENT_OTHER): Payer: Self-pay

## 2023-08-01 ENCOUNTER — Other Ambulatory Visit (HOSPITAL_COMMUNITY): Payer: Self-pay

## 2023-08-01 DIAGNOSIS — G4733 Obstructive sleep apnea (adult) (pediatric): Secondary | ICD-10-CM | POA: Diagnosis not present

## 2023-08-03 ENCOUNTER — Other Ambulatory Visit (HOSPITAL_COMMUNITY): Payer: Self-pay

## 2023-08-06 DIAGNOSIS — G4733 Obstructive sleep apnea (adult) (pediatric): Secondary | ICD-10-CM | POA: Diagnosis not present

## 2023-08-10 ENCOUNTER — Other Ambulatory Visit (HOSPITAL_COMMUNITY): Payer: Self-pay

## 2023-08-12 ENCOUNTER — Other Ambulatory Visit: Payer: Self-pay

## 2023-08-12 ENCOUNTER — Other Ambulatory Visit (HOSPITAL_COMMUNITY): Payer: Self-pay

## 2023-08-12 MED ORDER — MOUNJARO 15 MG/0.5ML ~~LOC~~ SOAJ
15.0000 mg | SUBCUTANEOUS | 5 refills | Status: AC
  Filled 2023-08-12 – 2023-08-13 (×2): qty 2, 28d supply, fill #0
  Filled 2023-09-05: qty 2, 28d supply, fill #1
  Filled 2023-10-09: qty 2, 28d supply, fill #2
  Filled 2023-11-06: qty 2, 28d supply, fill #3
  Filled 2023-12-12: qty 2, 28d supply, fill #4
  Filled 2024-01-01 – 2024-01-06 (×2): qty 2, 28d supply, fill #5
  Filled 2024-02-03: qty 2, 28d supply, fill #6
  Filled 2024-03-13: qty 2, 28d supply, fill #7
  Filled 2024-04-11: qty 2, 28d supply, fill #8
  Filled 2024-05-11: qty 2, 28d supply, fill #9

## 2023-08-13 ENCOUNTER — Other Ambulatory Visit (HOSPITAL_COMMUNITY): Payer: Self-pay

## 2023-08-13 ENCOUNTER — Other Ambulatory Visit: Payer: Self-pay

## 2023-08-13 NOTE — Pre-Procedure Instructions (Signed)
Surgical Instructions   Your procedure is scheduled on September 04, 2023. Report to [facility] Main Entrance "A" at 6:30 A.M., then check in with the Admitting office. Any questions or running late day of surgery: call [phone]  Questions prior to your surgery date: call [phone], Monday-Friday, 8am-4pm. If you experience any cold or flu symptoms such as cough, fever, chills, shortness of breath, etc. between now and your scheduled surgery, please notify us at the above number.     Remember:  Do not eat after midnight the night before your surgery  You may drink clear liquids until 5:30 AM the morning of your surgery.   Clear liquids allowed are: Water, Non-Citrus Juices (without pulp), Carbonated Beverages, Clear Tea (no milk, honey, etc.), Black Coffee Only (NO MILK, CREAM OR POWDERED CREAMER of any kind), and Gatorade.  Patient Instructions  The night before surgery:  No food after midnight. ONLY clear liquids after midnight  The day of surgery (if you have diabetes): Drink ONE (1) 12 oz G2 given to you in your pre admission testing appointment by 5:30 AM the morning of surgery. Drink in one sitting. Do not sip.  This drink was given to you during your hospital  pre-op appointment visit.  Nothing else to drink after completing the  12 oz bottle of G2.         If you have questions, please contact your surgeon's office.     Take these medicines the morning of surgery with A SIP OF WATER: atorvastatin (LIPITOR)  beclomethasone (QVAR REDIHALER) inhaler  cyclobenzaprine (FLEXERIL)  gabapentin (NEURONTIN)  linaclotide (LINZESS)  metoprolol succinate (TOPROL-XL)  omeprazole (PRILOSEC)  Plecanatide (TRULANCE)    May take these medicines IF NEEDED: albuterol (PROVENTIL) nebulizer solution  ALPRAZolam (XANAX)  baclofen (LIORESAL)  HYDROcodone-acetaminophen (NORCO)  Lasmiditan Succinate (REYVOW)  promethazine (PHENERGAN)  traMADol (ULTRAM)    One week prior to  surgery, STOP taking any Aspirin (unless otherwise instructed by your surgeon) Aleve, Naproxen, Ibuprofen, Motrin, Advil, Goody's, BC's, all herbal medications, fish oil, and non-prescription vitamins. This includes your medication: diclofenac (VOLTAREN) and meloxicam (MOBIC)    WHAT DO I DO ABOUT MY DIABETES MEDICATION?   STOP taking your dapagliflozin propanediol (FARXIGA) three days prior to surgery. Your last dose will be December 7th.   THE NIGHT BEFORE SURGERY, take 37 units of your insulin degludec (TRESIBA)     THE MORNING OF SURGERY, take 22.5 units of your insulin degludec (TRESIBA).  STOP taking your tirzepatide [facility]) one week prior to surgery. DO NOT take any doses after December 4th.  If your CBG is greater than 220 mg/dL, you may take  of your sliding scale (correction) dose of insulin lispro (HUMALOG).   HOW TO MANAGE YOUR DIABETES BEFORE AND AFTER SURGERY  Why is it important to control my blood sugar before and after surgery? Improving blood sugar levels before and after surgery helps healing and can limit problems. A way of improving blood sugar control is eating a healthy diet by:  Eating less sugar and carbohydrates  Increasing activity/exercise  Talking with your doctor about reaching your blood sugar goals High blood sugars (greater than 180 mg/dL) can raise your risk of infections and slow your recovery, so you will need to focus on controlling your diabetes during the weeks before surgery. Make sure that the doctor who takes care of your diabetes knows about your planned surgery including the date and location.  How do I manage my blood sugar before surgery? Check  your blood sugar at least 4 times a day, starting 2 days before surgery, to make sure that the level is not too high or low.  Check your blood sugar the morning of your surgery when you wake up and every 2 hours until you get to the Short Stay unit.  If your blood sugar is less than 70 mg/dL,  you will need to treat for low blood sugar: Do not take insulin. Treat a low blood sugar (less than 70 mg/dL) with  cup of clear juice (cranberry or apple), 4 glucose tablets, OR glucose gel. Recheck blood sugar in 15 minutes after treatment (to make sure it is greater than 70 mg/dL). If your blood sugar is not greater than 70 mg/dL on recheck, call [phone] for further instructions. Report your blood sugar to the short stay nurse when you get to Short Stay.  If you are admitted to the hospital after surgery: Your blood sugar will be checked by the staff and you will probably be given insulin after surgery (instead of oral diabetes medicines) to make sure you have good blood sugar levels. The goal for blood sugar control after surgery is 80-180 mg/dL.                      Do NOT Smoke (Tobacco/Vaping) for 24 hours prior to your procedure.  If you use a CPAP at night, you may bring your mask/headgear for your overnight stay.   You will be asked to remove any contacts, glasses, piercing's, hearing aid's, dentures/partials prior to surgery. Please bring cases for these items if needed.    Patients discharged the day of surgery will not be allowed to drive home, and someone needs to stay with them for 24 hours.  SURGICAL WAITING ROOM VISITATION Patients may have no more than 2 support people in the waiting area - these visitors may rotate.   Pre-op nurse will coordinate an appropriate time for 1 ADULT support person, who may not rotate, to accompany patient in pre-op.  Children under the age of 42 must have an adult with them who is not the patient and must remain in the main waiting area with an adult.  If the patient needs to stay at the hospital during part of their recovery, the visitor guidelines for inpatient rooms apply.  Please refer to the [facility] website for the visitor guidelines for any additional information.   If you received a COVID test during your pre-op visit  it  is requested that you wear a mask when out in public, stay away from anyone that may not be feeling well and notify your surgeon if you develop symptoms. If you have been in contact with anyone that has tested positive in the last 10 days please notify you surgeon.      Pre-operative 5 CHG Bathing Instructions   You can play a key role in reducing the risk of infection after surgery. Your skin needs to be as free of germs as possible. You can reduce the number of germs on your skin by washing with CHG (chlorhexidine gluconate) soap before surgery. CHG is an antiseptic soap that kills germs and continues to kill germs even after washing.   DO NOT use if you have an allergy to chlorhexidine/CHG or antibacterial soaps. If your skin becomes reddened or irritated, stop using the CHG and notify one of our RNs at [phone].   Please shower with the CHG soap starting 4 days before surgery  using the following schedule:     Please keep in mind the following:  DO NOT shave, including legs and underarms, starting the day of your first shower.   You may shave your face at any point before/day of surgery.  Place clean sheets on your bed the day you start using CHG soap. Use a clean washcloth (not used since being washed) for each shower. DO NOT sleep with pets once you start using the CHG.   CHG Shower Instructions:  Wash your face and private area with normal soap. If you choose to wash your hair, wash first with your normal shampoo.  After you use shampoo/soap, rinse your hair and body thoroughly to remove shampoo/soap residue.  Turn the water OFF and apply about 3 tablespoons (45 ml) of CHG soap to a CLEAN washcloth.  Apply CHG soap ONLY FROM YOUR NECK DOWN TO YOUR TOES (washing for 3-5 minutes)  DO NOT use CHG soap on face, private areas, open wounds, or sores.  Pay special attention to the area where your surgery is being performed.  If you are having back surgery, having someone wash your  back for you may be helpful. Wait 2 minutes after CHG soap is applied, then you may rinse off the CHG soap.  Pat dry with a clean towel  Put on clean clothes/pajamas   If you choose to wear lotion, please use ONLY the CHG-compatible lotions on the back of this paper.   Additional instructions for the day of surgery: DO NOT APPLY any lotions, deodorants, cologne, or perfumes.   Do not bring valuables to the hospital. [facility] is not responsible for any belongings/valuables. Do not wear nail polish, gel polish, artificial nails, or any other type of covering on natural nails (fingers and toes) Do not wear jewelry or makeup Put on clean/comfortable clothes.  Please brush your teeth.  Ask your nurse before applying any prescription medications to the skin.     CHG Compatible Lotions   Aveeno Moisturizing lotion  Cetaphil Moisturizing Cream  Cetaphil Moisturizing Lotion  Clairol Herbal Essence Moisturizing Lotion, Dry Skin  Clairol Herbal Essence Moisturizing Lotion, Extra Dry Skin  Clairol Herbal Essence Moisturizing Lotion, Normal Skin  Curel Age Defying Therapeutic Moisturizing Lotion with Alpha Hydroxy  Curel Extreme Care Body Lotion  Curel Soothing Hands Moisturizing Hand Lotion  Curel Therapeutic Moisturizing Cream, Fragrance-Free  Curel Therapeutic Moisturizing Lotion, Fragrance-Free  Curel Therapeutic Moisturizing Lotion, Original Formula  Eucerin Daily Replenishing Lotion  Eucerin Dry Skin Therapy Plus Alpha Hydroxy Crme  Eucerin Dry Skin Therapy Plus Alpha Hydroxy Lotion  Eucerin Original Crme  Eucerin Original Lotion  Eucerin Plus Crme Eucerin Plus Lotion  Eucerin TriLipid Replenishing Lotion  Keri Anti-Bacterial Hand Lotion  Keri Deep Conditioning Original Lotion Dry Skin Formula Softly Scented  Keri Deep Conditioning Original Lotion, Fragrance Free Sensitive Skin Formula  Keri Lotion Fast Absorbing Fragrance Free Sensitive Skin Formula  Keri Lotion Fast  Absorbing Softly Scented Dry Skin Formula  Keri Original Lotion  Keri Skin Renewal Lotion Keri Silky Smooth Lotion  Keri Silky Smooth Sensitive Skin Lotion  Nivea Body Creamy Conditioning Oil  Nivea Body Extra Enriched Lotion  Nivea Body Original Lotion  Nivea Body Sheer Moisturizing Lotion Nivea Crme  Nivea Skin Firming Lotion  NutraDerm 30 Skin Lotion  NutraDerm Skin Lotion  NutraDerm Therapeutic Skin Cream  NutraDerm Therapeutic Skin Lotion  ProShield Protective Hand Cream  Provon moisturizing lotion  Please read over the following fact sheets that you were  given.

## 2023-08-14 ENCOUNTER — Other Ambulatory Visit (HOSPITAL_COMMUNITY): Payer: Self-pay

## 2023-08-14 ENCOUNTER — Other Ambulatory Visit (HOSPITAL_BASED_OUTPATIENT_CLINIC_OR_DEPARTMENT_OTHER): Payer: Self-pay

## 2023-08-14 ENCOUNTER — Inpatient Hospital Stay (HOSPITAL_COMMUNITY)
Admission: RE | Admit: 2023-08-14 | Discharge: 2023-08-14 | Disposition: A | Discharge status: Home / Self Care | Payer: Commercial Managed Care - PPO | Source: Ambulatory Visit

## 2023-08-14 MED ORDER — OXYCODONE-ACETAMINOPHEN 5-325 MG PO TABS
1.0000 | ORAL_TABLET | ORAL | 0 refills | Status: DC | PRN
  Filled 2023-08-14: qty 8, 2d supply, fill #0

## 2023-08-14 MED ORDER — IBUPROFEN 800 MG PO TABS
800.0000 mg | ORAL_TABLET | Freq: Three times a day (TID) | ORAL | 0 refills | Status: DC | PRN
  Filled 2023-08-14: qty 16, 6d supply, fill #0

## 2023-08-19 ENCOUNTER — Ambulatory Visit (HOSPITAL_COMMUNITY)
Admission: RE | Admit: 2023-08-19 | Discharge: 2023-08-19 | Disposition: A | Discharge status: Home / Self Care | Payer: Commercial Managed Care - PPO | Source: Ambulatory Visit | Attending: Gastroenterology | Admitting: Gastroenterology

## 2023-08-19 DIAGNOSIS — R1310 Dysphagia, unspecified: Secondary | ICD-10-CM | POA: Insufficient documentation

## 2023-08-19 DIAGNOSIS — K219 Gastro-esophageal reflux disease without esophagitis: Secondary | ICD-10-CM | POA: Diagnosis not present

## 2023-08-19 DIAGNOSIS — M1712 Unilateral primary osteoarthritis, left knee: Secondary | ICD-10-CM

## 2023-08-20 ENCOUNTER — Other Ambulatory Visit (HOSPITAL_COMMUNITY): Payer: Self-pay

## 2023-08-20 ENCOUNTER — Other Ambulatory Visit (HOSPITAL_BASED_OUTPATIENT_CLINIC_OR_DEPARTMENT_OTHER): Payer: Self-pay

## 2023-08-20 ENCOUNTER — Other Ambulatory Visit: Payer: Self-pay

## 2023-08-20 MED ORDER — DAPAGLIFLOZIN PROPANEDIOL 10 MG PO TABS
10.0000 mg | ORAL_TABLET | Freq: Every day | ORAL | 5 refills | Status: DC
  Filled 2023-08-20: qty 90, 90d supply, fill #0

## 2023-08-21 ENCOUNTER — Other Ambulatory Visit (HOSPITAL_COMMUNITY): Payer: Self-pay

## 2023-08-21 DIAGNOSIS — E7800 Pure hypercholesterolemia, unspecified: Secondary | ICD-10-CM | POA: Diagnosis not present

## 2023-08-21 DIAGNOSIS — I10 Essential (primary) hypertension: Secondary | ICD-10-CM | POA: Diagnosis not present

## 2023-08-21 DIAGNOSIS — E1165 Type 2 diabetes mellitus with hyperglycemia: Secondary | ICD-10-CM | POA: Diagnosis not present

## 2023-08-21 MED ORDER — DAPAGLIFLOZIN PROPANEDIOL 10 MG PO TABS
10.0000 mg | ORAL_TABLET | Freq: Every day | ORAL | 5 refills | Status: DC
  Filled 2023-08-21 – 2023-12-07 (×2): qty 30, 30d supply, fill #0
  Filled 2024-01-01: qty 30, 30d supply, fill #1
  Filled 2024-01-31: qty 30, 30d supply, fill #2
  Filled 2024-02-29 (×2): qty 30, 30d supply, fill #3
  Filled 2024-03-31: qty 30, 30d supply, fill #4
  Filled 2024-05-11: qty 30, 30d supply, fill #5

## 2023-08-23 ENCOUNTER — Other Ambulatory Visit: Payer: Self-pay

## 2023-08-24 ENCOUNTER — Other Ambulatory Visit (HOSPITAL_COMMUNITY): Payer: Self-pay

## 2023-08-28 ENCOUNTER — Other Ambulatory Visit: Payer: Self-pay | Admitting: Physician Assistant

## 2023-08-28 ENCOUNTER — Other Ambulatory Visit (HOSPITAL_COMMUNITY): Payer: Self-pay

## 2023-08-28 MED ORDER — DOCUSATE SODIUM 100 MG PO CAPS
100.0000 mg | ORAL_CAPSULE | Freq: Every day | ORAL | 2 refills | Status: DC | PRN
  Filled 2023-08-28: qty 30, 30d supply, fill #0
  Filled 2023-09-24: qty 30, 30d supply, fill #1
  Filled 2023-11-06: qty 30, 30d supply, fill #2

## 2023-08-28 MED ORDER — OXYCODONE-ACETAMINOPHEN 5-325 MG PO TABS
1.0000 | ORAL_TABLET | Freq: Four times a day (QID) | ORAL | 0 refills | Status: DC | PRN
  Filled 2023-08-28: qty 40, 5d supply, fill #0

## 2023-08-28 MED ORDER — METHOCARBAMOL 750 MG PO TABS
750.0000 mg | ORAL_TABLET | Freq: Two times a day (BID) | ORAL | 2 refills | Status: DC | PRN
  Filled 2023-08-28: qty 20, 10d supply, fill #0
  Filled 2023-09-14: qty 20, 10d supply, fill #1

## 2023-08-28 MED ORDER — DOXYCYCLINE HYCLATE 100 MG PO CAPS
100.0000 mg | ORAL_CAPSULE | Freq: Two times a day (BID) | ORAL | 0 refills | Status: DC
  Filled 2023-08-28: qty 20, 10d supply, fill #0

## 2023-08-28 MED ORDER — RIVAROXABAN 10 MG PO TABS
10.0000 mg | ORAL_TABLET | Freq: Every day | ORAL | 0 refills | Status: DC
  Filled 2023-08-28: qty 30, 30d supply, fill #0

## 2023-08-28 MED ORDER — ONDANSETRON HCL 4 MG PO TABS
4.0000 mg | ORAL_TABLET | Freq: Three times a day (TID) | ORAL | 0 refills | Status: AC | PRN
  Filled 2023-08-28: qty 40, 14d supply, fill #0

## 2023-08-29 ENCOUNTER — Other Ambulatory Visit (HOSPITAL_COMMUNITY): Payer: Self-pay

## 2023-08-29 MED ORDER — DEXLANSOPRAZOLE 60 MG PO CPDR
60.0000 mg | DELAYED_RELEASE_CAPSULE | Freq: Every day | ORAL | 4 refills | Status: DC
  Filled 2023-08-29: qty 90, 90d supply, fill #0

## 2023-08-29 NOTE — Progress Notes (Signed)
Surgical Instructions   Your procedure is scheduled on Wednesday, December 11th, 2024. Report to [facility] Main Entrance "A" at 6:30 A.M., then check in with the Admitting office. Any questions or running late day of surgery: call [phone]  Questions prior to your surgery date: call [phone], Monday-Friday, 8am-4pm. If you experience any cold or flu symptoms such as cough, fever, chills, shortness of breath, etc. between now and your scheduled surgery, please notify us at the above number.     Remember:  Do not eat after midnight the night before your surgery  You may drink clear liquids until 5:30 the morning of your surgery.   Clear liquids allowed are: Water, Non-Citrus Juices (without pulp), Carbonated Beverages, Clear Tea (no milk, honey, etc.), Black Coffee Only (NO MILK, CREAM OR POWDERED CREAMER of any kind), and Gatorade.  Patient Instructions  The night before surgery:  No food after midnight. ONLY clear liquids after midnight   The day of surgery (if you have diabetes): Drink ONE (1) 12 oz G2 given to you in your pre admission testing appointment by 5:30 the morning of surgery. Drink in one sitting. Do not sip.  This drink was given to you during your hospital  pre-op appointment visit.  Nothing else to drink after completing the  12 oz bottle of G2.         If you have questions, please contact your surgeon's office.     Take these medicines the morning of surgery with A SIP OF WATER: Atorvastatin (Lipitor) Beclomethasone (Qvar Redihaler) inhaler Cyclobenzaprine (Flexeril) Gabapentin (Neurontin) Linaclotide (Linzess) Metoprolol Succinate (Toprol-XL) Omeprazole (Prilosec) Plecanatide (Trulance)   May take these medicines IF NEEDED: Albuterol (Proventil) nebulizer  Alprazolam (Xanax) Baclofen (Lioresal) Hydrocodone-acetaminophen (Norco) Lasmiditan Succinate (Reyvow) Promethazine (Phenergan) Tramadol (Ultram)    One week prior to surgery, STOP  taking any Aspirin (unless otherwise instructed by your surgeon) Aleve, Naproxen, Ibuprofen, Motrin, Advil, Goody's, BC's, all herbal medications, fish oil, and non-prescription vitamins.   WHAT DO I DO ABOUT MY DIABETES MEDICATION?   STOP taking your dapagliflozin propanediol (FARXIGA) three days prior to surgery. Your last dose will be December 7th.     THE NIGHT BEFORE SURGERY, take _37_ units of insulin degludec (TRESIBA).   THE MORNING OF SURGERY, take __22.5_ units of insulin degludec (TRESIBA).  STOP taking your tirzepatide [facility]) one week prior to surgery. DO NOT take any doses after December 4th.  If your CBG is greater than 220 mg/dL, you may take  of your sliding scale (correction) dose of insulin lispro (Humalog).   HOW TO MANAGE YOUR DIABETES BEFORE AND AFTER SURGERY  Why is it important to control my blood sugar before and after surgery? Improving blood sugar levels before and after surgery helps healing and can limit problems. A way of improving blood sugar control is eating a healthy diet by:  Eating less sugar and carbohydrates  Increasing activity/exercise  Talking with your doctor about reaching your blood sugar goals High blood sugars (greater than 180 mg/dL) can raise your risk of infections and slow your recovery, so you will need to focus on controlling your diabetes during the weeks before surgery. Make sure that the doctor who takes care of your diabetes knows about your planned surgery including the date and location.  How do I manage my blood sugar before surgery? Check your blood sugar at least 4 times a day, starting 2 days before surgery, to make sure that the level is not too high or low.  Check your blood sugar the morning of your surgery when you wake up and every 2 hours until you get to the Short Stay unit.  If your blood sugar is less than 70 mg/dL, you will need to treat for low blood sugar: Do not take insulin. Treat a low blood sugar  (less than 70 mg/dL) with  cup of clear juice (cranberry or apple), 4 glucose tablets, OR glucose gel. Recheck blood sugar in 15 minutes after treatment (to make sure it is greater than 70 mg/dL). If your blood sugar is not greater than 70 mg/dL on recheck, call [phone] for further instructions. Report your blood sugar to the short stay nurse when you get to Short Stay.  If you are admitted to the hospital after surgery: Your blood sugar will be checked by the staff and you will probably be given insulin after surgery (instead of oral diabetes medicines) to make sure you have good blood sugar levels. The goal for blood sugar control after surgery is 80-180 mg/dL.                      Do NOT Smoke (Tobacco/Vaping) for 24 hours prior to your procedure.  If you use a CPAP at night, you may bring your mask/headgear for your overnight stay.   You will be asked to remove any contacts, glasses, piercing's, hearing aid's, dentures/partials prior to surgery. Please bring cases for these items if needed.    Patients discharged the day of surgery will not be allowed to drive home, and someone needs to stay with them for 24 hours.  SURGICAL WAITING ROOM VISITATION Patients may have no more than 2 support people in the waiting area - these visitors may rotate.   Pre-op nurse will coordinate an appropriate time for 1 ADULT support person, who may not rotate, to accompany patient in pre-op.  Children under the age of 15 must have an adult with them who is not the patient and must remain in the main waiting area with an adult.  If the patient needs to stay at the hospital during part of their recovery, the visitor guidelines for inpatient rooms apply.  Please refer to the [facility] website for the visitor guidelines for any additional information.   If you received a COVID test during your pre-op visit  it is requested that you wear a mask when out in public, stay away from anyone that may not be  feeling well and notify your surgeon if you develop symptoms. If you have been in contact with anyone that has tested positive in the last 10 days please notify you surgeon.      Pre-operative 5 CHG Bathing Instructions   You can play a key role in reducing the risk of infection after surgery. Your skin needs to be as free of germs as possible. You can reduce the number of germs on your skin by washing with CHG (chlorhexidine gluconate) soap before surgery. CHG is an antiseptic soap that kills germs and continues to kill germs even after washing.   DO NOT use if you have an allergy to chlorhexidine/CHG or antibacterial soaps. If your skin becomes reddened or irritated, stop using the CHG and notify one of our RNs at [phone].   Please shower with the CHG soap starting 4 days before surgery using the following schedule:     Please keep in mind the following:  DO NOT shave, including legs and underarms, starting the day of your  first shower.   You may shave your face at any point before/day of surgery.  Place clean sheets on your bed the day you start using CHG soap. Use a clean washcloth (not used since being washed) for each shower. DO NOT sleep with pets once you start using the CHG.   CHG Shower Instructions:  Wash your face and private area with normal soap. If you choose to wash your hair, wash first with your normal shampoo.  After you use shampoo/soap, rinse your hair and body thoroughly to remove shampoo/soap residue.  Turn the water OFF and apply about 3 tablespoons (45 ml) of CHG soap to a CLEAN washcloth.  Apply CHG soap ONLY FROM YOUR NECK DOWN TO YOUR TOES (washing for 3-5 minutes)  DO NOT use CHG soap on face, private areas, open wounds, or sores.  Pay special attention to the area where your surgery is being performed.  If you are having back surgery, having someone wash your back for you may be helpful. Wait 2 minutes after CHG soap is applied, then you may rinse off  the CHG soap.  Pat dry with a clean towel  Put on clean clothes/pajamas   If you choose to wear lotion, please use ONLY the CHG-compatible lotions on the back of this paper.   Additional instructions for the day of surgery: DO NOT APPLY any lotions, deodorants, cologne, or perfumes.   Do not bring valuables to the hospital. [facility] is not responsible for any belongings/valuables. Do not wear nail polish, gel polish, artificial nails, or any other type of covering on natural nails (fingers and toes) Do not wear jewelry or makeup Put on clean/comfortable clothes.  Please brush your teeth.  Ask your nurse before applying any prescription medications to the skin.     CHG Compatible Lotions   Aveeno Moisturizing lotion  Cetaphil Moisturizing Cream  Cetaphil Moisturizing Lotion  Clairol Herbal Essence Moisturizing Lotion, Dry Skin  Clairol Herbal Essence Moisturizing Lotion, Extra Dry Skin  Clairol Herbal Essence Moisturizing Lotion, Normal Skin  Curel Age Defying Therapeutic Moisturizing Lotion with Alpha Hydroxy  Curel Extreme Care Body Lotion  Curel Soothing Hands Moisturizing Hand Lotion  Curel Therapeutic Moisturizing Cream, Fragrance-Free  Curel Therapeutic Moisturizing Lotion, Fragrance-Free  Curel Therapeutic Moisturizing Lotion, Original Formula  Eucerin Daily Replenishing Lotion  Eucerin Dry Skin Therapy Plus Alpha Hydroxy Crme  Eucerin Dry Skin Therapy Plus Alpha Hydroxy Lotion  Eucerin Original Crme  Eucerin Original Lotion  Eucerin Plus Crme Eucerin Plus Lotion  Eucerin TriLipid Replenishing Lotion  Keri Anti-Bacterial Hand Lotion  Keri Deep Conditioning Original Lotion Dry Skin Formula Softly Scented  Keri Deep Conditioning Original Lotion, Fragrance Free Sensitive Skin Formula  Keri Lotion Fast Absorbing Fragrance Free Sensitive Skin Formula  Keri Lotion Fast Absorbing Softly Scented Dry Skin Formula  Keri Original Lotion  Keri Skin Renewal Lotion Keri  Silky Smooth Lotion  Keri Silky Smooth Sensitive Skin Lotion  Nivea Body Creamy Conditioning Oil  Nivea Body Extra Enriched Lotion  Nivea Body Original Lotion  Nivea Body Sheer Moisturizing Lotion Nivea Crme  Nivea Skin Firming Lotion  NutraDerm 30 Skin Lotion  NutraDerm Skin Lotion  NutraDerm Therapeutic Skin Cream  NutraDerm Therapeutic Skin Lotion  ProShield Protective Hand Cream  Provon moisturizing lotion  Please read over the following fact sheets that you were given.

## 2023-08-30 ENCOUNTER — Encounter (HOSPITAL_COMMUNITY): Payer: Self-pay

## 2023-08-30 ENCOUNTER — Encounter: Payer: Self-pay | Admitting: Orthopaedic Surgery

## 2023-08-30 ENCOUNTER — Encounter (HOSPITAL_COMMUNITY)
Admission: RE | Admit: 2023-08-30 | Discharge: 2023-08-30 | Disposition: A | Discharge status: Home / Self Care | Payer: Commercial Managed Care - PPO | Source: Ambulatory Visit | Attending: Orthopaedic Surgery | Admitting: Orthopaedic Surgery

## 2023-08-30 ENCOUNTER — Other Ambulatory Visit: Payer: Self-pay

## 2023-08-30 ENCOUNTER — Telehealth: Payer: Self-pay | Admitting: Orthopaedic Surgery

## 2023-08-30 ENCOUNTER — Ambulatory Visit: Payer: Commercial Managed Care - PPO | Admitting: Orthopaedic Surgery

## 2023-08-30 VITALS — BP 109/70 | HR 87 | Temp 97.7°F | Resp 17 | Ht 64.0 in | Wt 212.8 lb

## 2023-08-30 DIAGNOSIS — J45909 Unspecified asthma, uncomplicated: Secondary | ICD-10-CM | POA: Diagnosis not present

## 2023-08-30 DIAGNOSIS — E119 Type 2 diabetes mellitus without complications: Secondary | ICD-10-CM | POA: Diagnosis not present

## 2023-08-30 DIAGNOSIS — G4733 Obstructive sleep apnea (adult) (pediatric): Secondary | ICD-10-CM | POA: Insufficient documentation

## 2023-08-30 DIAGNOSIS — Z794 Long term (current) use of insulin: Secondary | ICD-10-CM | POA: Diagnosis not present

## 2023-08-30 DIAGNOSIS — I10 Essential (primary) hypertension: Secondary | ICD-10-CM | POA: Diagnosis not present

## 2023-08-30 DIAGNOSIS — Z01818 Encounter for other preprocedural examination: Secondary | ICD-10-CM

## 2023-08-30 DIAGNOSIS — M1712 Unilateral primary osteoarthritis, left knee: Secondary | ICD-10-CM | POA: Insufficient documentation

## 2023-08-30 DIAGNOSIS — Z01812 Encounter for preprocedural laboratory examination: Secondary | ICD-10-CM | POA: Insufficient documentation

## 2023-08-30 HISTORY — DX: Headache, unspecified: R51.9

## 2023-08-30 HISTORY — DX: Essential (primary) hypertension: I10

## 2023-08-30 HISTORY — DX: Anxiety disorder, unspecified: F41.9

## 2023-08-30 HISTORY — DX: Palpitations: R00.2

## 2023-08-30 HISTORY — DX: Unilateral primary osteoarthritis, left knee: M17.12

## 2023-08-30 LAB — BASIC METABOLIC PANEL
Anion gap: 10 (ref 5–15)
BUN: 13 mg/dL (ref 6–20)
CO2: 27 mmol/L (ref 22–32)
Calcium: 9.2 mg/dL (ref 8.9–10.3)
Chloride: 100 mmol/L (ref 98–111)
Creatinine, Ser: 0.91 mg/dL (ref 0.44–1.00)
GFR, Estimated: >60 mL/min (ref >60)
Glucose, Bld: 105 mg/dL — ABNORMAL HIGH (ref 70–99)
Potassium: 2.9 mmol/L — ABNORMAL LOW (ref 3.5–5.1)
Sodium: 137 mmol/L (ref 135–145)

## 2023-08-30 LAB — SURGICAL PCR SCREEN
MRSA, PCR: NEGATIVE
Staphylococcus aureus: NEGATIVE

## 2023-08-30 LAB — CBC
HCT: 38 % (ref 36.0–46.0)
Hemoglobin: 11.9 g/dL — ABNORMAL LOW (ref 12.0–15.0)
MCH: 26.7 pg (ref 26.0–34.0)
MCHC: 31.3 g/dL (ref 30.0–36.0)
MCV: 85.4 fL (ref 80.0–100.0)
Platelets: 509 10*3/uL — ABNORMAL HIGH (ref 150–400)
RBC: 4.45 MIL/uL (ref 3.87–5.11)
RDW: 14.8 % (ref 11.5–15.5)
WBC: 7.7 10*3/uL (ref 4.0–10.5)
nRBC: 0 % (ref 0.0–0.2)

## 2023-08-30 LAB — GLUCOSE, CAPILLARY: Glucose-Capillary: 86 mg/dL (ref 70–99)

## 2023-08-30 NOTE — Telephone Encounter (Signed)
[employer] forms received. To [employer]. 

## 2023-08-30 NOTE — Progress Notes (Addendum)
PCP - Dr. Antony Haste Cardiologist - Dr. Jodelle Red Endocrinologist- Dr. Dorisann Frames  PPM/ICD - denies   Chest x-ray - 06/09/18 EKG - 03/15/23 Stress Test - 05/15/19 ECHO - 09/18/22 Cardiac Cath - denies  Sleep Study - OSA+ CPAP - nightly, pt unsure of pressure settings  Fasting Blood Sugar - 80-150 Checks Blood Sugar continuously (has Libre sensor)  Last dose of GLP1 agonist-  08/24/23 GLP1 instructions: Hold 7 days prior to surgery. Pt aware not to take any more doses prior to surgery  ASA/Blood Thinner Instructions: n/a   ERAS Protcol - yes PRE-SURGERY G2- given at PAT  COVID TEST- n/a   Anesthesia review: yes, cardiac hx  Patient denies shortness of breath, fever, cough and chest pain at PAT appointment   All instructions explained to the patient, with a verbal understanding of the material. Patient agrees to go over the instructions while at home for a better understanding.  The opportunity to ask questions was provided.

## 2023-08-30 NOTE — Progress Notes (Addendum)
Surgical Instructions   Your procedure is scheduled on Wednesday, December 11th, 2024. Report to [facility] Main Entrance "A" at 6:00 A.M., then check in with the Admitting office. Any questions or running late day of surgery: call [phone]  Questions prior to your surgery date: call [phone], Monday-Friday, 8am-4pm. If you experience any cold or flu symptoms such as cough, fever, chills, shortness of breath, etc. between now and your scheduled surgery, please notify us at the above number.     Remember:  Do not eat after midnight the night before your surgery  You may drink clear liquids until 5:30 the morning of your surgery.   Clear liquids allowed are: Water, Non-Citrus Juices (without pulp), Carbonated Beverages, Clear Tea (no milk, honey, etc.), Black Coffee Only (NO MILK, CREAM OR POWDERED CREAMER of any kind), and Gatorade.  Patient Instructions  The night before surgery:  No food after midnight. ONLY clear liquids after midnight   The day of surgery (if you have diabetes): Drink ONE (1) 12 oz G2 given to you in your pre admission testing appointment by 5:30 the morning of surgery. Drink in one sitting. Do not sip.  This drink was given to you during your hospital  pre-op appointment visit.  Nothing else to drink after completing the  12 oz bottle of G2.         If you have questions, please contact your surgeon's office.     Take these medicines the morning of surgery with A SIP OF WATER: Atorvastatin (Lipitor) Gabapentin (Neurontin) Metoprolol Succinate (Toprol-XL) Omeprazole (Prilosec) Plecanatide (Trulance)   May take these medicines IF NEEDED: Albuterol (Proventil) nebulizer  Baclofen (Lioresal) Lasmiditan Succinate (Reyvow) Promethazine (Phenergan) Tramadol (Ultram) ALPRAZolam (XANAX)  Beclomethasone (Qvar Redihaler) inhaler Cyclobenzaprine (Flexeril)   One week prior to surgery, STOP taking any Aspirin (unless otherwise instructed by your  surgeon) Aleve, Naproxen, Ibuprofen, Motrin, Advil, Goody's, BC's, meloxicam (MOBIC), all herbal medications, fish oil, and non-prescription vitamins.   WHAT DO I DO ABOUT MY DIABETES MEDICATION?   STOP taking your dapagliflozin propanediol (FARXIGA) three days prior to surgery. Your last dose will be December 7th.     THE NIGHT BEFORE SURGERY, take _30_ units of insulin degludec (TRESIBA).   THE MORNING OF SURGERY, take __20_ units of insulin degludec (TRESIBA).  STOP taking your tirzepatide [facility]) one week prior to surgery. DO NOT take any doses after December 3rd.  If your CBG is greater than 220 mg/dL, you may take  of your sliding scale (correction) dose of insulin lispro (Humalog).   HOW TO MANAGE YOUR DIABETES BEFORE AND AFTER SURGERY  Why is it important to control my blood sugar before and after surgery? Improving blood sugar levels before and after surgery helps healing and can limit problems. A way of improving blood sugar control is eating a healthy diet by:  Eating less sugar and carbohydrates  Increasing activity/exercise  Talking with your doctor about reaching your blood sugar goals High blood sugars (greater than 180 mg/dL) can raise your risk of infections and slow your recovery, so you will need to focus on controlling your diabetes during the weeks before surgery. Make sure that the doctor who takes care of your diabetes knows about your planned surgery including the date and location.  How do I manage my blood sugar before surgery? Check your blood sugar at least 4 times a day, starting 2 days before surgery, to make sure that the level is not too high or low.  Check  your blood sugar the morning of your surgery when you wake up and every 2 hours until you get to the Short Stay unit.  If your blood sugar is less than 70 mg/dL, you will need to treat for low blood sugar: Do not take insulin. Treat a low blood sugar (less than 70 mg/dL) with  cup of clear  juice (cranberry or apple), 4 glucose tablets, OR glucose gel. Recheck blood sugar in 15 minutes after treatment (to make sure it is greater than 70 mg/dL). If your blood sugar is not greater than 70 mg/dL on recheck, call [phone] for further instructions. Report your blood sugar to the short stay nurse when you get to Short Stay.  If you are admitted to the hospital after surgery: Your blood sugar will be checked by the staff and you will probably be given insulin after surgery (instead of oral diabetes medicines) to make sure you have good blood sugar levels. The goal for blood sugar control after surgery is 80-180 mg/dL.                      Do NOT Smoke (Tobacco/Vaping) for 24 hours prior to your procedure.  If you use a CPAP at night, you may bring your mask/headgear for your overnight stay.   You will be asked to remove any contacts, glasses, piercing's, hearing aid's, dentures/partials prior to surgery. Please bring cases for these items if needed.    Patients discharged the day of surgery will not be allowed to drive home, and someone needs to stay with them for 24 hours.  SURGICAL WAITING ROOM VISITATION Patients may have no more than 2 support people in the waiting area - these visitors may rotate.   Pre-op nurse will coordinate an appropriate time for 1 ADULT support person, who may not rotate, to accompany patient in pre-op.  Children under the age of 31 must have an adult with them who is not the patient and must remain in the main waiting area with an adult.  If the patient needs to stay at the hospital during part of their recovery, the visitor guidelines for inpatient rooms apply.  Please refer to the [facility] website for the visitor guidelines for any additional information.   If you received a COVID test during your pre-op visit  it is requested that you wear a mask when out in public, stay away from anyone that may not be feeling well and notify your surgeon if  you develop symptoms. If you have been in contact with anyone that has tested positive in the last 10 days please notify you surgeon.      Pre-operative 5 CHG Bathing Instructions   You can play a key role in reducing the risk of infection after surgery. Your skin needs to be as free of germs as possible. You can reduce the number of germs on your skin by washing with CHG (chlorhexidine gluconate) soap before surgery. CHG is an antiseptic soap that kills germs and continues to kill germs even after washing.   DO NOT use if you have an allergy to chlorhexidine/CHG or antibacterial soaps. If your skin becomes reddened or irritated, stop using the CHG and notify one of our RNs at [phone].   Please shower with the CHG soap starting 4 days before surgery using the following schedule:     Please keep in mind the following:  DO NOT shave, including legs and underarms, starting the day of your first  shower.   You may shave your face at any point before/day of surgery.  Place clean sheets on your bed the day you start using CHG soap. Use a clean washcloth (not used since being washed) for each shower. DO NOT sleep with pets once you start using the CHG.   CHG Shower Instructions:  Wash your face and private area with normal soap. If you choose to wash your hair, wash first with your normal shampoo.  After you use shampoo/soap, rinse your hair and body thoroughly to remove shampoo/soap residue.  Turn the water OFF and apply about 3 tablespoons (45 ml) of CHG soap to a CLEAN washcloth.  Apply CHG soap ONLY FROM YOUR NECK DOWN TO YOUR TOES (washing for 3-5 minutes)  DO NOT use CHG soap on face, private areas, open wounds, or sores.  Pay special attention to the area where your surgery is being performed.  If you are having back surgery, having someone wash your back for you may be helpful. Wait 2 minutes after CHG soap is applied, then you may rinse off the CHG soap.  Pat dry with a clean  towel  Put on clean clothes/pajamas   If you choose to wear lotion, please use ONLY the CHG-compatible lotions on the back of this paper.   Additional instructions for the day of surgery: DO NOT APPLY any lotions, deodorants, cologne, or perfumes.   Do not bring valuables to the hospital. [facility] is not responsible for any belongings/valuables. Do not wear nail polish, gel polish, artificial nails, or any other type of covering on natural nails (fingers and toes) Do not wear jewelry or makeup Put on clean/comfortable clothes.  Please brush your teeth.  Ask your nurse before applying any prescription medications to the skin.     CHG Compatible Lotions   Aveeno Moisturizing lotion  Cetaphil Moisturizing Cream  Cetaphil Moisturizing Lotion  Clairol Herbal Essence Moisturizing Lotion, Dry Skin  Clairol Herbal Essence Moisturizing Lotion, Extra Dry Skin  Clairol Herbal Essence Moisturizing Lotion, Normal Skin  Curel Age Defying Therapeutic Moisturizing Lotion with Alpha Hydroxy  Curel Extreme Care Body Lotion  Curel Soothing Hands Moisturizing Hand Lotion  Curel Therapeutic Moisturizing Cream, Fragrance-Free  Curel Therapeutic Moisturizing Lotion, Fragrance-Free  Curel Therapeutic Moisturizing Lotion, Original Formula  Eucerin Daily Replenishing Lotion  Eucerin Dry Skin Therapy Plus Alpha Hydroxy Crme  Eucerin Dry Skin Therapy Plus Alpha Hydroxy Lotion  Eucerin Original Crme  Eucerin Original Lotion  Eucerin Plus Crme Eucerin Plus Lotion  Eucerin TriLipid Replenishing Lotion  Keri Anti-Bacterial Hand Lotion  Keri Deep Conditioning Original Lotion Dry Skin Formula Softly Scented  Keri Deep Conditioning Original Lotion, Fragrance Free Sensitive Skin Formula  Keri Lotion Fast Absorbing Fragrance Free Sensitive Skin Formula  Keri Lotion Fast Absorbing Softly Scented Dry Skin Formula  Keri Original Lotion  Keri Skin Renewal Lotion Keri Silky Smooth Lotion  Keri Silky Smooth  Sensitive Skin Lotion  Nivea Body Creamy Conditioning Oil  Nivea Body Extra Enriched Lotion  Nivea Body Original Lotion  Nivea Body Sheer Moisturizing Lotion Nivea Crme  Nivea Skin Firming Lotion  NutraDerm 30 Skin Lotion  NutraDerm Skin Lotion  NutraDerm Therapeutic Skin Cream  NutraDerm Therapeutic Skin Lotion  ProShield Protective Hand Cream  Provon moisturizing lotion  Please read over the following fact sheets that you were given.

## 2023-08-30 NOTE — Progress Notes (Signed)
Patient ID: Julie Hansen, female   DOB: 1971/06/07, 52 y.o.   MRN: [id number]  Patient comes in today for follow-up evaluation of left knee pain and questions prior to her knee replacement next week.  Exam of the left knee is unchanged.  All of her questions were answered in detail and to her satisfaction today.  Will see her next week for surgery.

## 2023-08-31 ENCOUNTER — Other Ambulatory Visit: Payer: Self-pay | Admitting: Physician Assistant

## 2023-08-31 ENCOUNTER — Other Ambulatory Visit (HOSPITAL_COMMUNITY): Payer: Self-pay

## 2023-08-31 ENCOUNTER — Other Ambulatory Visit (HOSPITAL_BASED_OUTPATIENT_CLINIC_OR_DEPARTMENT_OTHER): Payer: Self-pay

## 2023-08-31 MED ORDER — POTASSIUM CHLORIDE CRYS ER 10 MEQ PO TBCR
40.0000 meq | EXTENDED_RELEASE_TABLET | Freq: Two times a day (BID) | ORAL | 0 refills | Status: DC
  Filled 2023-08-31: qty 32, 4d supply, fill #0

## 2023-08-31 NOTE — Progress Notes (Signed)
Pre-op showed low potassium.  Looks like may already be on potassium, but I sent in additional supplementation to go ahead and start taking asap

## 2023-09-02 ENCOUNTER — Other Ambulatory Visit (HOSPITAL_COMMUNITY): Payer: Self-pay

## 2023-09-02 ENCOUNTER — Telehealth: Payer: Self-pay

## 2023-09-02 NOTE — Anesthesia Preprocedure Evaluation (Signed)
Anesthesia Evaluation    History of Anesthesia Complications (+) PONV and history of anesthetic complications  Airway Mallampati: II       Dental no notable dental hx.    Pulmonary sleep apnea and Continuous Positive Airway Pressure Ventilation    Pulmonary exam normal        Cardiovascular hypertension, Normal cardiovascular exam     Neuro/Psych  Headaches PSYCHIATRIC DISORDERS Anxiety        GI/Hepatic Neg liver ROS,,,  Endo/Other  diabetes, Well Controlled, Type 2, Insulin Dependent  Class 3 obesity  Renal/GU negative Renal ROS     Musculoskeletal  (+) Arthritis , Osteoarthritis,    Abdominal  (+) + obese  Peds  Hematology  (+) Blood dyscrasia, anemia   Anesthesia Other Findings Echocardiogram with normal heart muscle function.  No significant valvular abnormalities.  No abnormalities that would cause shortness of breath. Good result!        Reproductive/Obstetrics negative OB ROS                              Anesthesia Physical Anesthesia Plan  ASA: 2  Anesthesia Plan: Spinal   Post-op Pain Management: Regional block*   Induction:   PONV Risk Score and Plan: 3 and Midazolam, Ondansetron and Treatment may vary due to age or medical condition  Airway Management Planned: Natural Airway and Simple Face Mask  Additional Equipment: None  Intra-op Plan:   Post-operative Plan:   Informed Consent: I have reviewed the patients History and Physical, chart, labs and discussed the procedure including the risks, benefits and alternatives for the proposed anesthesia with the patient or authorized representative who has indicated his/her understanding and acceptance.       Plan Discussed with: CRNA  Anesthesia Plan Comments: (PAT note by Julie Poles, PA-C: 52 year old female with pertinent history including IDDM 2, asthma, GERD, HTN, OSA on CPAP, migraines, anxiety.  Patient had  prior cardiology evaluation for report of shortness of breath and palpitations. She had ETT done at [facility] 05/15/2019. CT coronary 2022 with cadrads 0, Ca score 0. Monitor and echo done 2022, 2024 were benign.   Patient reports last dose of GLP-1 agonist 08/24/2023.  Preop labs reviewed, moderate hypokalemia potassium 2.9, mild anemia hemoglobin 11.9, otherwise unremarkable.  Patient already on potassium chloride ER 20 mEq daily.  She was instructed by surgeon's office to reach out to PCP for further guidance.  EKG 03/15/2023: NSR.  Rate 76.  Event monitor 10/2022: Patch Wear Time:  10 days and 7 hours    Patient had a min HR of 66 bpm, max HR of 134 bpm, and avg HR of 92 bpm. Predominant underlying rhythm was Sinus Rhythm. No VT, SVT, atrial fibrillation, high degree block, or pauses noted. Isolated atrial and ventricular ectopy was rare (<1%). There were 24 triggered events, which were sinus rhythm. No significant arrhythmias detected.   TTE 09/18/2022:  1. Left ventricular ejection fraction, by estimation, is 60 to 65%. The  left ventricle has normal function. The left ventricle has no regional  wall motion abnormalities. Left ventricular diastolic parameters were  normal.   2. Right ventricular systolic function is normal. The right ventricular  size is normal.   3. The mitral valve is normal in structure. No evidence of mitral valve  regurgitation. No evidence of mitral stenosis.   4. The aortic valve is normal in structure. Aortic valve regurgitation is  not visualized. No aortic  stenosis is present.   5. The inferior vena cava is normal in size with greater than 50%  respiratory variability, suggesting right atrial pressure of 3 mmHg.   Comparison(s): EF 70%.   Coronary CTA 08/31/2021: IMPRESSION: 1. No evidence of CAD, CADRADS = 0.   2. Coronary calcium score of 0. This was 0 percentile for age and sex matched control.   3. Normal coronary origin with right dominance.  )          Anesthesia Quick Evaluation

## 2023-09-02 NOTE — Telephone Encounter (Signed)
Called patient. No answer. LMOM that her potassium labs came back low. A Supplement has been sent to her pharmacy to start taking ASAP. I ask her to call us if she has any questions.

## 2023-09-02 NOTE — Progress Notes (Signed)
Anesthesia Chart Review:  52 year old female with pertinent history including IDDM 2, asthma, GERD, HTN, OSA on CPAP, migraines, anxiety.  Patient had prior cardiology evaluation for report of shortness of breath and palpitations. She had ETT done at [facility] 05/15/2019. CT coronary 2022 with cadrads 0, Ca score 0. Monitor and echo done 2022, 2024 were benign.   Patient reports last dose of GLP-1 agonist 08/24/2023.  Preop labs reviewed, moderate hypokalemia potassium 2.9, mild anemia hemoglobin 11.9, otherwise unremarkable.  Patient already on potassium chloride ER 20 mEq daily.  She was instructed by surgeon's office to reach out to PCP for further guidance.  EKG 03/15/2023: NSR.  Rate 76.  Event monitor 10/2022: Patch Wear Time:  10 days and 7 hours    Patient had a min HR of 66 bpm, max HR of 134 bpm, and avg HR of 92 bpm. Predominant underlying rhythm was Sinus Rhythm. No VT, SVT, atrial fibrillation, high degree block, or pauses noted. Isolated atrial and ventricular ectopy was rare (<1%). There were 24 triggered events, which were sinus rhythm. No significant arrhythmias detected.   TTE 09/18/2022:  1. Left ventricular ejection fraction, by estimation, is 60 to 65%. The  left ventricle has normal function. The left ventricle has no regional  wall motion abnormalities. Left ventricular diastolic parameters were  normal.   2. Right ventricular systolic function is normal. The right ventricular  size is normal.   3. The mitral valve is normal in structure. No evidence of mitral valve  regurgitation. No evidence of mitral stenosis.   4. The aortic valve is normal in structure. Aortic valve regurgitation is  not visualized. No aortic stenosis is present.   5. The inferior vena cava is normal in size with greater than 50%  respiratory variability, suggesting right atrial pressure of 3 mmHg.   Comparison(s): EF 70%.   Coronary CTA 08/31/2021: IMPRESSION: 1. No evidence of CAD, CADRADS =  0.   2. Coronary calcium score of 0. This was 0 percentile for age and sex matched control.   3. Normal coronary origin with right dominance.    Zannie Cove [facility]/Anesthesiology Phone [phone] 09/02/2023 1:45 PM

## 2023-09-02 NOTE — Telephone Encounter (Signed)
Have her check with pcp. Potassium was low on her labwork

## 2023-09-02 NOTE — Telephone Encounter (Signed)
Sent her mychart msg advising.

## 2023-09-02 NOTE — Progress Notes (Signed)
Notified patient via voicemail. 

## 2023-09-02 NOTE — Telephone Encounter (Signed)
Called patient to advise on labs. She states she is already taking Potassium Chloride ER 20 MEQ ( Take 1 tablet (20 mEq) by mouth daily.) Does she take both Rx at the same time?

## 2023-09-03 ENCOUNTER — Encounter: Payer: Commercial Managed Care - PPO | Admitting: Physician Assistant

## 2023-09-03 ENCOUNTER — Other Ambulatory Visit (HOSPITAL_COMMUNITY): Payer: Self-pay

## 2023-09-03 MED ORDER — TRANEXAMIC ACID 1000 MG/10ML IV SOLN
2000.0000 mg | INTRAVENOUS | Status: AC
  Filled 2023-09-03: qty 20

## 2023-09-04 ENCOUNTER — Other Ambulatory Visit: Payer: Self-pay

## 2023-09-04 ENCOUNTER — Observation Stay (HOSPITAL_COMMUNITY): Payer: Commercial Managed Care - PPO

## 2023-09-04 ENCOUNTER — Other Ambulatory Visit (HOSPITAL_COMMUNITY): Payer: Self-pay

## 2023-09-04 ENCOUNTER — Ambulatory Visit (HOSPITAL_COMMUNITY): Payer: Commercial Managed Care - PPO | Admitting: Physician Assistant

## 2023-09-04 ENCOUNTER — Encounter (HOSPITAL_COMMUNITY)
Admission: RE | Disposition: A | Discharge status: Home / Self Care | Payer: Self-pay | Source: Home / Self Care | Attending: Orthopaedic Surgery

## 2023-09-04 ENCOUNTER — Encounter (HOSPITAL_COMMUNITY): Payer: Self-pay | Admitting: Orthopaedic Surgery

## 2023-09-04 ENCOUNTER — Observation Stay (HOSPITAL_COMMUNITY)
Admission: RE | Admit: 2023-09-04 | Discharge: 2023-09-05 | Disposition: A | Discharge status: Home / Self Care | Payer: Commercial Managed Care - PPO | Attending: Orthopaedic Surgery | Admitting: Orthopaedic Surgery

## 2023-09-04 DIAGNOSIS — I10 Essential (primary) hypertension: Secondary | ICD-10-CM | POA: Diagnosis not present

## 2023-09-04 DIAGNOSIS — G8918 Other acute postprocedural pain: Secondary | ICD-10-CM | POA: Diagnosis not present

## 2023-09-04 DIAGNOSIS — Z794 Long term (current) use of insulin: Secondary | ICD-10-CM | POA: Diagnosis not present

## 2023-09-04 DIAGNOSIS — M1712 Unilateral primary osteoarthritis, left knee: Secondary | ICD-10-CM

## 2023-09-04 DIAGNOSIS — Z96652 Presence of left artificial knee joint: Secondary | ICD-10-CM | POA: Diagnosis not present

## 2023-09-04 DIAGNOSIS — Z79899 Other long term (current) drug therapy: Secondary | ICD-10-CM | POA: Insufficient documentation

## 2023-09-04 DIAGNOSIS — J45909 Unspecified asthma, uncomplicated: Secondary | ICD-10-CM | POA: Diagnosis not present

## 2023-09-04 DIAGNOSIS — Z7901 Long term (current) use of anticoagulants: Secondary | ICD-10-CM | POA: Diagnosis not present

## 2023-09-04 DIAGNOSIS — Z471 Aftercare following joint replacement surgery: Secondary | ICD-10-CM | POA: Diagnosis not present

## 2023-09-04 DIAGNOSIS — Z01818 Encounter for other preprocedural examination: Secondary | ICD-10-CM

## 2023-09-04 DIAGNOSIS — E119 Type 2 diabetes mellitus without complications: Secondary | ICD-10-CM | POA: Insufficient documentation

## 2023-09-04 DIAGNOSIS — M25562 Pain in left knee: Secondary | ICD-10-CM | POA: Diagnosis not present

## 2023-09-04 DIAGNOSIS — R609 Edema, unspecified: Secondary | ICD-10-CM | POA: Diagnosis not present

## 2023-09-04 HISTORY — PX: TOTAL KNEE ARTHROPLASTY: SHX125

## 2023-09-04 LAB — POCT I-STAT, CHEM 8
BUN: 15 mg/dL (ref 6–20)
Calcium, Ion: 1.21 mmol/L (ref 1.15–1.40)
Chloride: 101 mmol/L (ref 98–111)
Creatinine, Ser: 0.8 mg/dL (ref 0.44–1.00)
Glucose, Bld: 178 mg/dL — ABNORMAL HIGH (ref 70–99)
HCT: 34 % — ABNORMAL LOW (ref 36.0–46.0)
Hemoglobin: 11.6 g/dL — ABNORMAL LOW (ref 12.0–15.0)
Potassium: 3.5 mmol/L (ref 3.5–5.1)
Sodium: 138 mmol/L (ref 135–145)
TCO2: 25 mmol/L (ref 22–32)

## 2023-09-04 LAB — GLUCOSE, CAPILLARY
Glucose-Capillary: 190 mg/dL — ABNORMAL HIGH (ref 70–99)
Glucose-Capillary: 104 mg/dL — ABNORMAL HIGH (ref 70–99)
Glucose-Capillary: 84 mg/dL (ref 70–99)
Glucose-Capillary: 126 mg/dL — ABNORMAL HIGH (ref 70–99)

## 2023-09-04 LAB — POCT PREGNANCY, URINE: Preg Test, Ur: NEGATIVE

## 2023-09-04 SURGERY — ARTHROPLASTY, KNEE, TOTAL
Anesthesia: Spinal | Site: Knee | Laterality: Left

## 2023-09-04 MED ORDER — DOXYCYCLINE HYCLATE 100 MG PO TABS
100.0000 mg | ORAL_TABLET | Freq: Two times a day (BID) | ORAL | Status: DC
  Administered 2023-09-04 – 2023-09-05 (×3): 100 mg via ORAL
  Filled 2023-09-04 (×3): qty 1

## 2023-09-04 MED ORDER — DOCUSATE SODIUM 100 MG PO CAPS
100.0000 mg | ORAL_CAPSULE | Freq: Two times a day (BID) | ORAL | Status: DC
  Administered 2023-09-04 – 2023-09-05 (×3): 100 mg via ORAL
  Filled 2023-09-04 (×3): qty 1

## 2023-09-04 MED ORDER — KETOROLAC TROMETHAMINE 30 MG/ML IJ SOLN
30.0000 mg | Freq: Once | INTRAMUSCULAR | Status: AC | PRN
  Administered 2023-09-04: 30 mg via INTRAVENOUS

## 2023-09-04 MED ORDER — MENTHOL 3 MG MT LOZG: 1.0000 | LOZENGE | OROMUCOSAL | Status: DC | PRN

## 2023-09-04 MED ORDER — PHENOL 1.4 % MT LIQD: 1.0000 | OROMUCOSAL | Status: DC | PRN

## 2023-09-04 MED ORDER — ORAL CARE MOUTH RINSE: 15.0000 mL | Freq: Once | OROMUCOSAL | Status: AC

## 2023-09-04 MED ORDER — ALPRAZOLAM 1 MG PO TABS
0.5000 mg | ORAL_TABLET | Freq: Two times a day (BID) | ORAL | 0 refills | Status: DC
  Filled 2023-09-04: qty 60, 30d supply, fill #0

## 2023-09-04 MED ORDER — METHOCARBAMOL 1000 MG/10ML IJ SOLN: 500.0000 mg | Freq: Four times a day (QID) | INTRAMUSCULAR | Status: DC | PRN

## 2023-09-04 MED ORDER — CHLORHEXIDINE GLUCONATE 0.12 % MT SOLN
15.0000 mL | Freq: Once | OROMUCOSAL | Status: AC
  Administered 2023-09-04: 15 mL via OROMUCOSAL
  Filled 2023-09-04: qty 15

## 2023-09-04 MED ORDER — VANCOMYCIN HCL 1000 MG IV SOLR
INTRAVENOUS | Status: DC | PRN
  Administered 2023-09-04: 1000 mg via TOPICAL

## 2023-09-04 MED ORDER — METOCLOPRAMIDE HCL 5 MG/ML IJ SOLN
5.0000 mg | Freq: Three times a day (TID) | INTRAMUSCULAR | Status: DC | PRN
Start: 2023-09-04 — End: 2023-09-05

## 2023-09-04 MED ORDER — KETOROLAC TROMETHAMINE 15 MG/ML IJ SOLN
15.0000 mg | Freq: Four times a day (QID) | INTRAMUSCULAR | Status: DC
  Administered 2023-09-04 – 2023-09-05 (×3): 15 mg via INTRAVENOUS
  Filled 2023-09-04 (×4): qty 1

## 2023-09-04 MED ORDER — FENTANYL CITRATE (PF) 250 MCG/5ML IJ SOLN
INTRAMUSCULAR | Status: DC | PRN
  Administered 2023-09-04: 100 ug via INTRAVENOUS

## 2023-09-04 MED ORDER — BUPIVACAINE-MELOXICAM ER 400-12 MG/14ML IJ SOLN
INTRAMUSCULAR | Status: AC
  Filled 2023-09-04: qty 1

## 2023-09-04 MED ORDER — CEFAZOLIN SODIUM-DEXTROSE 2-4 GM/100ML-% IV SOLN
2.0000 g | Freq: Four times a day (QID) | INTRAVENOUS | Status: AC
  Administered 2023-09-04 (×2): 2 g via INTRAVENOUS
  Filled 2023-09-04 (×2): qty 100

## 2023-09-04 MED ORDER — LINACLOTIDE 145 MCG PO CAPS
290.0000 ug | ORAL_CAPSULE | Freq: Every day | ORAL | Status: DC
  Administered 2023-09-05: 290 ug via ORAL
  Filled 2023-09-04: qty 2

## 2023-09-04 MED ORDER — SODIUM CHLORIDE 0.9 % IR SOLN
Status: DC | PRN
  Administered 2023-09-04: 1000 mL

## 2023-09-04 MED ORDER — OXYCODONE HCL 5 MG PO TABS
5.0000 mg | ORAL_TABLET | ORAL | Status: DC | PRN
  Administered 2023-09-04 – 2023-09-05 (×3): 10 mg via ORAL
  Filled 2023-09-04 (×2): qty 2

## 2023-09-04 MED ORDER — VALSARTAN-HYDROCHLOROTHIAZIDE 160-12.5 MG PO TABS: 1.0000 | ORAL_TABLET | Freq: Every day | ORAL | Status: DC

## 2023-09-04 MED ORDER — BUPIVACAINE IN DEXTROSE 0.75-8.25 % IT SOLN
INTRATHECAL | Status: DC | PRN
  Administered 2023-09-04: 1.4 mL via INTRATHECAL

## 2023-09-04 MED ORDER — HYDROMORPHONE HCL 1 MG/ML IJ SOLN
0.2500 mg | INTRAMUSCULAR | Status: DC | PRN
  Administered 2023-09-04 (×3): 0.5 mg via INTRAVENOUS

## 2023-09-04 MED ORDER — SODIUM CHLORIDE 0.9 % IV SOLN: 12.5000 mg | INTRAVENOUS | Status: DC | PRN

## 2023-09-04 MED ORDER — ACETAMINOPHEN 325 MG PO TABS: 325.0000 mg | ORAL_TABLET | Freq: Four times a day (QID) | ORAL | Status: DC | PRN

## 2023-09-04 MED ORDER — OXYCODONE HCL 5 MG PO TABS
10.0000 mg | ORAL_TABLET | ORAL | Status: DC | PRN
  Administered 2023-09-04: 15 mg via ORAL
  Filled 2023-09-04: qty 3

## 2023-09-04 MED ORDER — MEPERIDINE HCL 25 MG/ML IJ SOLN: 6.2500 mg | INTRAMUSCULAR | Status: DC | PRN

## 2023-09-04 MED ORDER — BUPIVACAINE-MELOXICAM ER 400-12 MG/14ML IJ SOLN
INTRAMUSCULAR | Status: DC | PRN
  Administered 2023-09-04: 400 mg

## 2023-09-04 MED ORDER — OXYCODONE HCL ER 10 MG PO T12A
10.0000 mg | EXTENDED_RELEASE_TABLET | Freq: Two times a day (BID) | ORAL | Status: DC
  Administered 2023-09-04 – 2023-09-05 (×2): 10 mg via ORAL
  Filled 2023-09-04 (×2): qty 1

## 2023-09-04 MED ORDER — LINACLOTIDE 145 MCG PO CAPS: 290.0000 ug | ORAL_CAPSULE | Freq: Every day | ORAL | Status: DC

## 2023-09-04 MED ORDER — HYDROMORPHONE HCL 1 MG/ML IJ SOLN: 0.5000 mg | INTRAMUSCULAR | Status: DC | PRN

## 2023-09-04 MED ORDER — ALPRAZOLAM 0.5 MG PO TABS
0.5000 mg | ORAL_TABLET | Freq: Two times a day (BID) | ORAL | Status: DC | PRN
  Administered 2023-09-04 – 2023-09-05 (×2): 1 mg via ORAL
  Filled 2023-09-04 (×2): qty 2

## 2023-09-04 MED ORDER — IRBESARTAN 150 MG PO TABS
150.0000 mg | ORAL_TABLET | Freq: Every day | ORAL | Status: DC
  Administered 2023-09-04 – 2023-09-05 (×2): 150 mg via ORAL
  Filled 2023-09-04 (×2): qty 1

## 2023-09-04 MED ORDER — LACTATED RINGERS IV SOLN: INTRAVENOUS | Status: DC

## 2023-09-04 MED ORDER — PHENYLEPHRINE 80 MCG/ML (10ML) SYRINGE FOR IV PUSH (FOR BLOOD PRESSURE SUPPORT)
PREFILLED_SYRINGE | INTRAVENOUS | Status: DC | PRN
  Administered 2023-09-04: 160 ug via INTRAVENOUS
  Administered 2023-09-04: 80 ug via INTRAVENOUS
  Administered 2023-09-04: 160 ug via INTRAVENOUS

## 2023-09-04 MED ORDER — ACETAMINOPHEN 500 MG PO TABS
1000.0000 mg | ORAL_TABLET | Freq: Four times a day (QID) | ORAL | Status: AC
  Administered 2023-09-04 – 2023-09-05 (×4): 1000 mg via ORAL
  Filled 2023-09-04 (×4): qty 2

## 2023-09-04 MED ORDER — GABAPENTIN 400 MG PO CAPS
400.0000 mg | ORAL_CAPSULE | Freq: Three times a day (TID) | ORAL | Status: DC
  Administered 2023-09-04 – 2023-09-05 (×3): 400 mg via ORAL
  Filled 2023-09-04 (×3): qty 1

## 2023-09-04 MED ORDER — BUPIVACAINE HCL (PF) 0.5 % IJ SOLN
INTRAMUSCULAR | Status: DC | PRN
  Administered 2023-09-04 (×5): 4 mL via PERINEURAL

## 2023-09-04 MED ORDER — HYDROMORPHONE HCL 1 MG/ML IJ SOLN
INTRAMUSCULAR | Status: AC
  Filled 2023-09-04: qty 1

## 2023-09-04 MED ORDER — INSULIN ASPART 100 UNIT/ML IJ SOLN
0.0000 [IU] | INTRAMUSCULAR | Status: DC | PRN
  Administered 2023-09-04: 4 [IU] via SUBCUTANEOUS
  Filled 2023-09-04: qty 1

## 2023-09-04 MED ORDER — PRONTOSAN WOUND IRRIGATION OPTIME
TOPICAL | Status: DC | PRN
  Administered 2023-09-04: 1 via TOPICAL

## 2023-09-04 MED ORDER — VANCOMYCIN HCL 1000 MG IV SOLR
INTRAVENOUS | Status: AC
  Filled 2023-09-04: qty 20

## 2023-09-04 MED ORDER — ONDANSETRON HCL 4 MG/2ML IJ SOLN: 4.0000 mg | Freq: Four times a day (QID) | INTRAMUSCULAR | Status: DC | PRN

## 2023-09-04 MED ORDER — OXYCODONE HCL 5 MG PO TABS
ORAL_TABLET | ORAL | Status: AC
  Filled 2023-09-04: qty 2

## 2023-09-04 MED ORDER — ONDANSETRON HCL 4 MG PO TABS: 4.0000 mg | ORAL_TABLET | Freq: Four times a day (QID) | ORAL | Status: DC | PRN

## 2023-09-04 MED ORDER — 0.9 % SODIUM CHLORIDE (POUR BTL) OPTIME
TOPICAL | Status: DC | PRN
  Administered 2023-09-04: 1000 mL

## 2023-09-04 MED ORDER — ZOLPIDEM TARTRATE 5 MG PO TABS
12.5000 mg | ORAL_TABLET | Freq: Every evening | ORAL | Status: DC | PRN
  Administered 2023-09-04: 12.5 mg via ORAL
  Filled 2023-09-04: qty 3

## 2023-09-04 MED ORDER — MIDAZOLAM HCL 2 MG/2ML IJ SOLN
INTRAMUSCULAR | Status: DC | PRN
  Administered 2023-09-04: 2 mg via INTRAVENOUS

## 2023-09-04 MED ORDER — DAPAGLIFLOZIN PROPANEDIOL 10 MG PO TABS
10.0000 mg | ORAL_TABLET | Freq: Every day | ORAL | Status: DC
Start: 2023-09-05 — End: 2023-09-05
  Administered 2023-09-05: 10 mg via ORAL
  Filled 2023-09-04: qty 1

## 2023-09-04 MED ORDER — INSULIN LISPRO (1 UNIT DIAL) 100 UNIT/ML (KWIKPEN)
20.0000 [IU] | PEN_INJECTOR | Freq: Three times a day (TID) | SUBCUTANEOUS | Status: DC
Start: 2023-09-04 — End: 2023-09-04

## 2023-09-04 MED ORDER — TRANEXAMIC ACID-NACL 1000-0.7 MG/100ML-% IV SOLN
1000.0000 mg | INTRAVENOUS | Status: AC
  Administered 2023-09-04: 1000 mg via INTRAVENOUS
  Filled 2023-09-04: qty 100

## 2023-09-04 MED ORDER — BUPIVACAINE LIPOSOME 1.3 % IJ SUSP
INTRAMUSCULAR | Status: DC | PRN
  Administered 2023-09-04 (×5): 2 mL via PERINEURAL

## 2023-09-04 MED ORDER — ALBUTEROL SULFATE (2.5 MG/3ML) 0.083% IN NEBU: 2.5000 mg | INHALATION_SOLUTION | Freq: Four times a day (QID) | RESPIRATORY_TRACT | Status: DC | PRN

## 2023-09-04 MED ORDER — METHOCARBAMOL 500 MG PO TABS
500.0000 mg | ORAL_TABLET | Freq: Four times a day (QID) | ORAL | Status: DC | PRN
  Administered 2023-09-04 – 2023-09-05 (×4): 500 mg via ORAL
  Filled 2023-09-04 (×4): qty 1

## 2023-09-04 MED ORDER — EPHEDRINE SULFATE-NACL 50-0.9 MG/10ML-% IV SOSY
PREFILLED_SYRINGE | INTRAVENOUS | Status: DC | PRN
  Administered 2023-09-04 (×3): 10 mg via INTRAVENOUS

## 2023-09-04 MED ORDER — RIVAROXABAN 10 MG PO TABS
10.0000 mg | ORAL_TABLET | Freq: Every day | ORAL | Status: DC
  Administered 2023-09-04 – 2023-09-05 (×2): 10 mg via ORAL
  Filled 2023-09-04 (×2): qty 1

## 2023-09-04 MED ORDER — PROPOFOL 500 MG/50ML IV EMUL
INTRAVENOUS | Status: DC | PRN
  Administered 2023-09-04: 50 ug/kg/min via INTRAVENOUS

## 2023-09-04 MED ORDER — DEXAMETHASONE SODIUM PHOSPHATE 10 MG/ML IJ SOLN
10.0000 mg | Freq: Once | INTRAMUSCULAR | Status: AC
  Administered 2023-09-05: 10 mg via INTRAVENOUS
  Filled 2023-09-04: qty 1

## 2023-09-04 MED ORDER — ZOLPIDEM TARTRATE 5 MG PO TABS: 5.0000 mg | ORAL_TABLET | Freq: Every evening | ORAL | Status: DC | PRN

## 2023-09-04 MED ORDER — PROPOFOL 10 MG/ML IV BOLUS
INTRAVENOUS | Status: AC
  Filled 2023-09-04: qty 20

## 2023-09-04 MED ORDER — POVIDONE-IODINE 10 % EX SWAB
2.0000 | Freq: Once | CUTANEOUS | Status: AC
  Administered 2023-09-04: 2 via TOPICAL

## 2023-09-04 MED ORDER — INSULIN ASPART 100 UNIT/ML IJ SOLN: 0.0000 [IU] | Freq: Three times a day (TID) | INTRAMUSCULAR | Status: DC

## 2023-09-04 MED ORDER — METOCLOPRAMIDE HCL 5 MG PO TABS: 5.0000 mg | ORAL_TABLET | Freq: Three times a day (TID) | ORAL | Status: DC | PRN

## 2023-09-04 MED ORDER — FERROUS SULFATE 325 (65 FE) MG PO TABS
325.0000 mg | ORAL_TABLET | Freq: Three times a day (TID) | ORAL | Status: DC
  Administered 2023-09-04 – 2023-09-05 (×3): 325 mg via ORAL
  Filled 2023-09-04 (×3): qty 1

## 2023-09-04 MED ORDER — MIDAZOLAM HCL 2 MG/2ML IJ SOLN
INTRAMUSCULAR | Status: AC
Start: 2023-09-04 — End: ?
  Filled 2023-09-04: qty 2

## 2023-09-04 MED ORDER — TRANEXAMIC ACID-NACL 1000-0.7 MG/100ML-% IV SOLN
1000.0000 mg | Freq: Once | INTRAVENOUS | Status: AC
  Administered 2023-09-04: 1000 mg via INTRAVENOUS
  Filled 2023-09-04: qty 100

## 2023-09-04 MED ORDER — HYDROCHLOROTHIAZIDE 12.5 MG PO TABS
12.5000 mg | ORAL_TABLET | Freq: Every day | ORAL | Status: DC
  Administered 2023-09-04 – 2023-09-05 (×2): 12.5 mg via ORAL
  Filled 2023-09-04 (×2): qty 1

## 2023-09-04 MED ORDER — CEFAZOLIN SODIUM-DEXTROSE 2-4 GM/100ML-% IV SOLN
2.0000 g | INTRAVENOUS | Status: AC
  Administered 2023-09-04: 2 g via INTRAVENOUS
  Filled 2023-09-04: qty 100

## 2023-09-04 MED ORDER — KETOROLAC TROMETHAMINE 30 MG/ML IJ SOLN
INTRAMUSCULAR | Status: AC
  Filled 2023-09-04: qty 1

## 2023-09-04 MED ORDER — PROPOFOL 1000 MG/100ML IV EMUL
INTRAVENOUS | Status: AC
  Filled 2023-09-04: qty 100

## 2023-09-04 MED ORDER — FENTANYL CITRATE (PF) 100 MCG/2ML IJ SOLN
INTRAMUSCULAR | Status: AC
  Filled 2023-09-04: qty 2

## 2023-09-04 MED ORDER — TRANEXAMIC ACID 1000 MG/10ML IV SOLN
INTRAVENOUS | Status: DC | PRN
  Administered 2023-09-04: 2000 mg via TOPICAL

## 2023-09-04 SURGICAL SUPPLY — 73 items
ALCOHOL 70% 16 OZ (MISCELLANEOUS) ×1 IMPLANT
BAG COUNTER SPONGE SURGICOUNT (BAG) IMPLANT
BAG DECANTER FOR FLEXI CONT (MISCELLANEOUS) ×1 IMPLANT
BANDAGE ESMARK 6X9 LF (GAUZE/BANDAGES/DRESSINGS) IMPLANT
BLADE SAG 18X100X1.27 (BLADE) ×1 IMPLANT
BLADE SAW SGTL 73X25 THK (BLADE) ×1 IMPLANT
BNDG ESMARK 6X9 LF (GAUZE/BANDAGES/DRESSINGS)
BOWL SMART MIX CTS (DISPOSABLE) ×1 IMPLANT
CLSR STERI-STRIP ANTIMIC 1/2X4 (GAUZE/BANDAGES/DRESSINGS) ×2 IMPLANT
COMP FEM PS KNEE NRW 7 LT (Joint) ×1 IMPLANT
COMP PATELLA PEG 3 32 (Joint) ×1 IMPLANT
COMPONENT FEM PS KNEE NRW 7 LT (Joint) IMPLANT
COMPONENT PATELLA PEG 3 32 (Joint) IMPLANT
COOLER ICEMAN CLASSIC (MISCELLANEOUS) ×1 IMPLANT
COVER SURGICAL LIGHT HANDLE (MISCELLANEOUS) ×1 IMPLANT
CUFF TOURN SGL QUICK 42 (TOURNIQUET CUFF) IMPLANT
CUFF TRNQT CYL 34X4.125X (TOURNIQUET CUFF) ×1 IMPLANT
DERMABOND ADVANCED .7 DNX12 (GAUZE/BANDAGES/DRESSINGS) ×1 IMPLANT
DRAPE EXTREMITY T 121X128X90 (DISPOSABLE) ×1 IMPLANT
DRAPE HALF SHEET 40X57 (DRAPES) ×1 IMPLANT
DRAPE INCISE IOBAN 66X45 STRL (DRAPES) ×1 IMPLANT
DRAPE POUCH INSTRU U-SHP 10X18 (DRAPES) ×1 IMPLANT
DRAPE SURG ORHT 6 SPLT 77X108 (DRAPES) IMPLANT
DRAPE U-SHAPE 47X51 STRL (DRAPES) ×2 IMPLANT
DRSG AQUACEL AG ADV 3.5X10 (GAUZE/BANDAGES/DRESSINGS) ×1 IMPLANT
DURAPREP 26ML APPLICATOR (WOUND CARE) ×3 IMPLANT
ELECT CAUTERY BLADE 6.4 (BLADE) ×1 IMPLANT
ELECT PENCIL ROCKER SW 15FT (MISCELLANEOUS) ×1 IMPLANT
ELECT REM PT RETURN 9FT ADLT (ELECTROSURGICAL) ×1
ELECTRODE REM PT RTRN 9FT ADLT (ELECTROSURGICAL) ×1 IMPLANT
GLOVE BIOGEL PI IND STRL 7.0 (GLOVE) ×2 IMPLANT
GLOVE BIOGEL PI IND STRL 7.5 (GLOVE) ×5 IMPLANT
GLOVE ECLIPSE 7.0 STRL STRAW (GLOVE) ×3 IMPLANT
GLOVE INDICATOR 7.0 STRL GRN (GLOVE) ×1 IMPLANT
GLOVE INDICATOR 7.5 STRL GRN (GLOVE) ×1 IMPLANT
GLOVE SURG SYN 7.5 E (GLOVE) ×2 IMPLANT
GLOVE SURG SYN 7.5 PF PI (GLOVE) ×2 IMPLANT
GLOVE SURG UNDER LTX SZ7.5 (GLOVE) ×2 IMPLANT
GLOVE SURG UNDER POLY LF SZ7 (GLOVE) ×2 IMPLANT
GOWN STRL REUS W/ TWL LRG LVL3 (GOWN DISPOSABLE) ×1 IMPLANT
GOWN STRL SURGICAL XL XLNG (GOWN DISPOSABLE) ×1 IMPLANT
GOWN TOGA ZIPPER T7+ PEEL AWAY (MISCELLANEOUS) ×2 IMPLANT
HOOD PEEL AWAY T7 (MISCELLANEOUS) ×1 IMPLANT
INSERT ARTISURF SZ 6-7 LT (Insert) IMPLANT
KIT BASIN OR (CUSTOM PROCEDURE TRAY) ×1 IMPLANT
KIT TURNOVER KIT B (KITS) ×1 IMPLANT
MANIFOLD NEPTUNE II (INSTRUMENTS) ×1 IMPLANT
MARKER SKIN DUAL TIP RULER LAB (MISCELLANEOUS) ×2 IMPLANT
NDL SPNL 18GX3.5 QUINCKE PK (NEEDLE) ×1 IMPLANT
NEEDLE SPNL 18GX3.5 QUINCKE PK (NEEDLE) ×1 IMPLANT
NS IRRIG 1000ML POUR BTL (IV SOLUTION) ×1 IMPLANT
PACK TOTAL JOINT (CUSTOM PROCEDURE TRAY) ×1 IMPLANT
PAD ARMBOARD 7.5X6 YLW CONV (MISCELLANEOUS) ×2 IMPLANT
PAD COLD SHLDR WRAP-ON (PAD) ×1 IMPLANT
PIN DRILL HDLS TROCAR 75 4PK (PIN) IMPLANT
SCREW FEMALE HEX FIX 25X2.5 (ORTHOPEDIC DISPOSABLE SUPPLIES) IMPLANT
SET HNDPC FAN SPRY TIP SCT (DISPOSABLE) ×1 IMPLANT
SOLUTION PRONTOSAN WOUND 350ML (IRRIGATION / IRRIGATOR) ×1 IMPLANT
STAPLER VISISTAT 35W (STAPLE) IMPLANT
STEM TIB PS KNEE D 0D LT (Stem) IMPLANT
SUCTION TUBE FRAZIER 10FR DISP (SUCTIONS) ×1 IMPLANT
SUT ETHILON 2 0 FS 18 (SUTURE) IMPLANT
SUT MNCRL AB 3-0 PS2 27 (SUTURE) IMPLANT
SUT VIC AB 0 CT1 27XBRD ANBCTR (SUTURE) ×2 IMPLANT
SUT VIC AB 1 CTX 27 (SUTURE) ×3 IMPLANT
SUT VIC AB 2-0 CT1 TAPERPNT 27 (SUTURE) ×4 IMPLANT
SYR 50ML LL SCALE MARK (SYRINGE) ×2 IMPLANT
TOWEL GREEN STERILE (TOWEL DISPOSABLE) ×1 IMPLANT
TOWEL GREEN STERILE FF (TOWEL DISPOSABLE) ×1 IMPLANT
TRAY CATH INTERMITTENT SS 16FR (CATHETERS) IMPLANT
TUBE SUCT ARGYLE STRL (TUBING) ×1 IMPLANT
UNDERPAD 30X36 HEAVY ABSORB (UNDERPADS AND DIAPERS) ×1 IMPLANT
YANKAUER SUCT BULB TIP NO VENT (SUCTIONS) ×2 IMPLANT

## 2023-09-04 NOTE — Discharge Instructions (Signed)

## 2023-09-04 NOTE — Anesthesia Procedure Notes (Signed)
Anesthesia Regional Block: Adductor canal block   Pre-Anesthetic Checklist: , timeout performed,  Correct Patient, Correct Site, Correct Laterality,  Correct Procedure, Correct Position, site marked,  Risks and benefits discussed,  Surgical consent,  Pre-op evaluation,  At surgeon's request and post-op pain management  Laterality: Lower and Left  Prep: chloraprep       Needles:  Injection technique: Single-shot  Needle Type: Echogenic Stimulator Needle     Needle Length: 9cm  Needle Gauge: 20   Needle insertion depth: 3.7 cm   Additional Needles:   Procedures:,,,, ultrasound used (permanent image in chart),,    Narrative:  Start time: 09/04/2023 8:15 AM End time: 09/04/2023 8:22 AM Injection made incrementally with aspirations every 5 mL.  Performed by: Personally  Anesthesiologist: Leilani Able, MD

## 2023-09-04 NOTE — Plan of Care (Signed)
  Problem: Education: Goal: Knowledge of General Education information will improve Description: Including pain rating scale, medication(s)/side effects and non-pharmacologic comfort measures Outcome: Progressing   Problem: Activity: Goal: Risk for activity intolerance will decrease Outcome: Progressing   Problem: Nutrition: Goal: Adequate nutrition will be maintained Outcome: Progressing   Problem: Elimination: Goal: Will not experience complications related to urinary retention Outcome: Progressing   Problem: Pain Management: Goal: General experience of comfort will improve Outcome: Progressing   Problem: Safety: Goal: Ability to remain free from injury will improve Outcome: Progressing   Problem: Skin Integrity: Goal: Risk for impaired skin integrity will decrease Outcome: Progressing

## 2023-09-04 NOTE — Progress Notes (Signed)
    Durable Medical Equipment  (From admission, onward)           Start     Ordered   09/04/23 1222  DME Walker rolling  Once       Question Answer Comment  Walker: With 5 Inch Wheels   Patient needs a walker to treat with the following condition Status post left partial knee replacement      09/04/23 1221   09/04/23 1222  DME 3 n 1  Once        09/04/23 1221   09/04/23 1222  DME Bedside commode  Once       Question:  Patient needs a bedside commode to treat with the following condition  Answer:  Status post left partial knee replacement   09/04/23 1221

## 2023-09-04 NOTE — Anesthesia Procedure Notes (Signed)
Spinal  Patient location during procedure: OR Start time: 09/04/2023 8:46 AM End time: 09/04/2023 8:48 AM Reason for block: surgical anesthesia Staffing Performed: anesthesiologist  Anesthesiologist: Leilani Able, MD Performed by: Leilani Able, MD Authorized by: Leilani Able, MD   Preanesthetic Checklist Completed: patient identified, IV checked, site marked, risks and benefits discussed, surgical consent, monitors and equipment checked, pre-op evaluation and timeout performed Spinal Block Patient position: sitting Prep: DuraPrep and site prepped and draped Patient monitoring: continuous pulse ox and blood pressure Approach: midline Location: L3-4 Injection technique: single-shot Needle Needle type: Pencan  Needle gauge: 24 G Needle length: 10 cm Needle insertion depth: 6 cm Assessment Sensory level: T8 Events: CSF return

## 2023-09-04 NOTE — Evaluation (Signed)
Physical Therapy Evaluation Patient Details Name: Julie Hansen MRN: [id number] DOB: [birth year] Today's Date: 09/04/2023  History of Present Illness  52 y.o. female presents to [facility] hospital on 09/04/2023 for elective L TKA. PMH includes DM, asthma, GERD, HLD, OSA, anxiety, migraines.  Clinical Impression  Pt presents to PT with deficits in strength, power, ROM, gait, balance. Pt is able to ambulate for short household distances with support of RW. PT provides education on TKR exercise packet. Pt will benefit from frequent mobilization in an effort to improve ROM and strength. PT will follow up tomorrow for further gait and stair training.        If plan is discharge home, recommend the following: A little help with bathing/dressing/bathroom;Assistance with cooking/housework;Assist for transportation;Help with stairs or ramp for entrance   Can travel by private vehicle        Equipment Recommendations None recommended by PT  Recommendations for Other Services       Functional Status Assessment Patient has had a recent decline in their functional status and demonstrates the ability to make significant improvements in function in a reasonable and predictable amount of time.     Precautions / Restrictions Precautions Precautions: Fall;Knee Precaution Booklet Issued: Yes (comment) Restrictions Weight Bearing Restrictions: Yes LLE Weight Bearing: Weight bearing as tolerated      Mobility  Bed Mobility Overal bed mobility: Needs Assistance Bed Mobility: Supine to Sit, Sit to Supine     Supine to sit: Supervision Sit to supine: Supervision        Transfers Overall transfer level: Needs assistance Equipment used: Rolling walker (2 wheels) Transfers: Sit to/from Stand Sit to Stand: Contact guard assist                Ambulation/Gait Ambulation/Gait assistance: Supervision Gait Distance (Feet): 80 Feet Assistive device: Rolling walker (2 wheels) Gait  Pattern/deviations: Step-through pattern Gait velocity: reduced Gait velocity interpretation: <1.8 ft/sec, indicate of risk for recurrent falls   General Gait Details: slowed step-through gait  Stairs            Wheelchair Mobility     Tilt Bed    Modified Rankin (Stroke Patients Only)       Balance Overall balance assessment: Needs assistance Sitting-balance support: No upper extremity supported, Feet supported Sitting balance-Leahy Scale: Good     Standing balance support: Bilateral upper extremity supported, Reliant on assistive device for balance Standing balance-Leahy Scale: Poor                               Pertinent Vitals/Pain Pain Assessment Pain Assessment: 0-10 Pain Score: 8  Pain Location: L knee Pain Descriptors / Indicators: Aching Pain Intervention(s): Monitored during session    Home Living Family/patient expects to be discharged to:: Private residence Living Arrangements: Spouse/significant other Available Help at Discharge: Family Type of Home: House Home Access: Stairs to enter Entrance Stairs-Rails: None Entrance Stairs-Number of Steps: 3   Home Layout: One level Home Equipment: Agricultural consultant (2 wheels);Crutches;Shower seat - built in;Shower seat      Prior Function Prior Level of Function : Independent/Modified Independent;Working/employed;Driving             Mobility Comments: typically ambulatory without device, was utilizing a crutch recently after injuring her R ankle       Extremity/Trunk Assessment   Upper Extremity Assessment Upper Extremity Assessment: Overall WFL for tasks assessed    Lower Extremity Assessment Lower Extremity Assessment:  LLE deficits/detail LLE Deficits / Details: generalized post-op weakness and ROM deficits as expected s/p TKA LLE Sensation: decreased light touch    Cervical / Trunk Assessment Cervical / Trunk Assessment: Normal  Communication    Communication Communication: No apparent difficulties Cueing Techniques: Verbal cues  Cognition Arousal: Alert Behavior During Therapy: WFL for tasks assessed/performed Overall Cognitive Status: Within Functional Limits for tasks assessed                                          General Comments General comments (skin integrity, edema, etc.): VSS on RA    Exercises     Assessment/Plan    PT Assessment Patient needs continued PT services  PT Problem List Decreased strength;Decreased range of motion;Decreased activity tolerance;Decreased balance;Decreased mobility;Decreased knowledge of use of DME;Pain       PT Treatment Interventions DME instruction;Gait training;Stair training;Functional mobility training;Therapeutic activities;Therapeutic exercise;Balance training;Neuromuscular re-education;Patient/family education    PT Goals (Current goals can be found in the Care Plan section)  Acute Rehab PT Goals Patient Stated Goal: to return to independence PT Goal Formulation: With patient Time For Goal Achievement: 09/08/23 Potential to Achieve Goals: Good    Frequency Min 1X/week     Co-evaluation               AM-PAC PT "6 Clicks" Mobility  Outcome Measure Help needed turning from your back to your side while in a flat bed without using bedrails?: A Little Help needed moving from lying on your back to sitting on the side of a flat bed without using bedrails?: A Little Help needed moving to and from a bed to a chair (including a wheelchair)?: A Little Help needed standing up from a chair using your arms (e.g., wheelchair or bedside chair)?: A Little Help needed to walk in hospital room?: A Little Help needed climbing 3-5 steps with a railing? : A Lot 6 Click Score: 17    End of Session Equipment Utilized During Treatment: Gait belt Activity Tolerance: Patient tolerated treatment well Patient left: in bed;with call bell/phone within reach;with  family/visitor present Nurse Communication: Mobility status PT Visit Diagnosis: Other abnormalities of gait and mobility (R26.89);Muscle weakness (generalized) (M62.81);Pain Pain - Right/Left: Left Pain - part of body: Knee    Time: 3295-1884 PT Time Calculation (min) (ACUTE ONLY): 28 min   Charges:   PT Evaluation $PT Eval Low Complexity: 1 Low   PT General Charges $$ ACUTE PT VISIT: 1 Visit         Arlyss Gandy, PT, DPT Acute Rehabilitation Office [phone]   Arlyss Gandy 09/04/2023, 3:42 PM

## 2023-09-04 NOTE — Transfer of Care (Signed)
Immediate Anesthesia Transfer of Care Note  Patient: Julie Hansen  Procedure(s) Performed: LEFT TOTAL KNEE ARTHROPLASTY (Left: Knee)  Patient Location: PACU  Anesthesia Type:MAC and Spinal  Level of Consciousness: awake, alert , and oriented  Airway & Oxygen Therapy: Patient Spontanous Breathing  Post-op Assessment: Report given to RN and Post -op Vital signs reviewed and stable  Post vital signs: Reviewed and stable  Last Vitals:  Vitals Value Taken Time  BP 93/63 09/04/23 1032  Temp 36.7 C 09/04/23 1032  Pulse 74 09/04/23 1037  Resp 14 09/04/23 1037  SpO2 96 % 09/04/23 1037  Vitals shown include unfiled device data.  Last Pain:  Vitals:   09/04/23 0705  TempSrc:   PainSc: 0-No pain      Patients Stated Pain Goal: 0 (09/04/23 0705)  Complications: There were no known notable events for this encounter.

## 2023-09-04 NOTE — H&P (Signed)
PREOPERATIVE H&P  Chief Complaint: left knee osteoarthritis  HPI: Julie Hansen is a 52 y.o. female who presents for surgical treatment of left knee osteoarthritis.  She denies any changes in medical history.  Past Surgical History:  Procedure Laterality Date   BREAST BIOPSY Left 04/09/2023   BREAST BIOPSY Left 04/09/2023   [place] LT BREAST BX W LOC DEV 1ST LESION IMG BX SPEC US GUIDE 04/09/2023 [facility]   BREAST BIOPSY Left 04/17/2023   MM LT BREAST BX W LOC DEV 1ST LESION IMAGE BX SPEC STEREO GUIDE 04/17/2023 [facility]   BREAST EXCISIONAL BIOPSY Right 06/28/2020   papilloma   DILATION AND EVACUATION N/A 06/05/2017   Procedure: DILATATION AND EVACUATION;  Surgeon: Essie Hart, MD;  Location: [facility];  Service: Gynecology;  Laterality: N/A;   KNEE ARTHROSCOPY WITH MENISCAL REPAIR Left 09/20/2021   Procedure: LEFT KNEE PARTIAL MEDIAL MENISCECTOMY;  Surgeon: Tarry Kos, MD;  Location: [facility];  Service: Orthopedics;  Laterality: Left;   RADIOACTIVE SEED GUIDED EXCISIONAL BREAST BIOPSY Right 06/28/2020   Procedure: RIGHT RADIOACTIVE SEED GUIDED EXCISIONAL BREAST BIOPSY;  Surgeon: Emelia Loron, MD;  Location: [facility];  Service: General;  Laterality: Right;   WISDOM TOOTH EXTRACTION     Social History   Socioeconomic History   Marital status: Married    Spouse name: Not on file   Number of children: 2   Years of education: Not on file   Highest education level: Not on file  Occupational History   Not on file  Tobacco Use   Smoking status: Never    Passive exposure: Never   Smokeless tobacco: Never  Vaping Use   Vaping status: Never Used  Substance and Sexual Activity   Alcohol use: Yes    Comment: maybe a drink once a month   Drug use: No   Sexual activity: Yes    Birth control/protection: None  Other Topics Concern   Not on file  Social History Narrative   Not on file   Social Determinants of Health    Financial Resource Strain: Low Risk  (03/25/2023)   Received from [facility], [facility]   Overall Financial Resource Strain (CARDIA)    Difficulty of Paying Living Expenses: Not hard at all  Food Insecurity: No Food Insecurity (03/25/2023)   Received from [facility], [facility]   Hunger Vital Sign    Worried About Running Out of Food in the Last Year: Never true    Ran Out of Food in the Last Year: Never true  Transportation Needs: No Transportation Needs (03/25/2023)   Received from [facility], [facility]   PRAPARE - Transportation    Lack of Transportation (Medical): No    Lack of Transportation (Non-Medical): No  Physical Activity: Unknown (03/25/2023)   Received from [facility], [facility]   Exercise Vital Sign    Days of Exercise per Week: 0 days    Minutes of Exercise per Session: Not on file  Stress: No Stress Concern Present (03/25/2023)   Received from [facility] - Occupational Stress Questionnaire    Feeling of Stress : Only a little  Social Connections: Socially Integrated (03/25/2023)   Received from [facility], [facility]   Social Network    How would you rate your social network (family, work, friends)?: Good participation with social networks   Family History  Problem Relation Age of Onset  Cancer Mother    Hypertension Mother    Diabetes Mother    Breast cancer Mother 73   Cancer Maternal Grandmother    Breast cancer Maternal Grandmother 90   Cancer Father    Diabetes Father    Hypertension Father    Breast cancer Paternal Aunt 51   Breast cancer Paternal Aunt 35   Breast cancer Paternal Aunt 85   Allergies  Allergen Reactions   Macrodantin [Nitrofurantoin Macrocrystal] Itching   Carbamazepine Other (See Comments)    Vaginal discharge   Vraylar [Cariprazine Hcl] Itching   Prior to Admission medications   Medication Sig Start Date End Date Taking? Authorizing Provider   albuterol (PROVENTIL) (2.5 MG/3ML) 0.083% nebulizer solution Take 2.5 mg by nebulization every 6 (six) hours as needed for wheezing or shortness of breath.   Yes [provider]  ALPRAZolam Prudy Feeler) 1 MG tablet Take 0.5-1 tablets (0.5-1 mg total) by mouth 2 (two) times daily as needed for severe anxiety 07/30/23  Yes   atorvastatin (LIPITOR) 40 MG tablet Take one tablet (40 mg dose) by mouth daily. 05/20/22  Yes   baclofen (LIORESAL) 10 MG tablet Take 1 tablet (10 mg total) by mouth up to three times daily as needed. 07/26/23  Yes Teague Edwena Blow, PA-C  beclomethasone (QVAR REDIHALER) 40 MCG/ACT inhaler Inhale 2 puffs into the lungs 2 times daily Patient taking differently: Inhale 2 puffs into the lungs 2 (two) times daily as needed (Asthma). 04/11/20  Yes   botulinum toxin Type A (BOTOX) 200 units injection Inject 200 Units into the muscle every 3 (three) months. 03/15/23  Yes Teague Chestine Spore, Scot Jun, PA-C  cyclobenzaprine (FLEXERIL) 10 MG tablet Take 1 tablet (10 mg total) by mouth 3 (three) times daily. Patient taking differently: Take 10 mg by mouth 3 (three) times daily as needed for muscle spasms. 07/26/23  Yes   dapagliflozin propanediol (FARXIGA) 10 MG TABS tablet Take 1 tablet (10 mg total) by mouth daily. 08/20/23  Yes   gabapentin (NEURONTIN) 400 MG capsule Take 1 capsule (400 mg total) by mouth 3 (three) times daily. 07/26/23  Yes Teague Edwena Blow, PA-C  ibuprofen (ADVIL) 800 MG tablet Take 1 tablet (800 mg total) by mouth every 8 (eight) hours as needed. 07/26/23  Yes Teague Edwena Blow, PA-C  insulin degludec (TRESIBA) 200 UNIT/ML FlexTouch Pen Inject 45 units into the skin in the morning and 74 units into the skin in the evening. Patient taking differently: Inject 40-60 Units into the skin See admin instructions. Inject 40 units into the skin every morning and 60 units every night. 02/26/23  Yes   insulin lispro (HUMALOG) 100 UNIT/ML KwikPen Inject 20-25 Units into the skin 3  (three) times daily. 01/09/23  Yes   Lasmiditan Succinate (REYVOW) 100 MG TABS Take 2 tablets (200 mg) by mouth as needed for headache 04/26/23  Yes Teague Edwena Blow, PA-C  meloxicam (MOBIC) 15 MG tablet Take 1 tablet (15 mg total) by mouth daily. Patient taking differently: Take 15 mg by mouth daily as needed for pain. 07/30/23  Yes   metoprolol succinate (TOPROL-XL) 50 MG 24 hr tablet Take 1 tablet (50 mg total) by mouth daily. Take with or immediately following a meal. 07/26/23  Yes Teague Edwena Blow, PA-C  omeprazole (PRILOSEC) 40 MG capsule Take 1 capsule (40 mg total) by mouth daily. 01/08/23  Yes   Plecanatide (TRULANCE) 3 MG TABS Take 1 tablet (3 mg total) by mouth daily.  03/26/23  Yes   potassium chloride (KLOR-CON M) 10 MEQ tablet Take 4 tablets (40 mEq total) by mouth 2 (two) times daily for 4 days for low potassium 08/31/23   Cristie Hem, PA-C  Potassium Chloride ER 20 MEQ TBCR Take 1 tablet (20 mEq) by mouth daily. 03/27/23  Yes   promethazine (PHENERGAN) 25 MG tablet Take 1 tablet (25 mg total) by mouth every 6 (six) hours as needed. 07/26/23  Yes Teague Edwena Blow, PA-C  tirzepatide [facility]) 15 MG/0.5ML Pen Inject 15 mg into the skin once a week. 08/10/23  Yes   traMADol (ULTRAM) 50 MG tablet Take 1 tablet by mouth every 12 hours as needed. 07/15/23  Yes Cristie Hem, PA-C  valsartan-hydrochlorothiazide (DIOVAN-HCT) 160-12.5 MG tablet Take 1 tablet by mouth daily. 01/11/23  Yes   Vitamin D, Ergocalciferol, (DRISDOL) 1.25 MG (50000 UNIT) CAPS capsule Take 1 capsule (50,000 Units total) by mouth 2 (two) times a week. 06/10/23  Yes   zolpidem (AMBIEN CR) 12.5 MG CR tablet Take 1 tablet (12.5 mg total) by mouth at bedtime as needed. Max daily amount: 12.5mg  Patient taking differently: Take 12.5 mg by mouth at bedtime. 07/16/23  Yes   ALPRAZolam (XANAX) 0.5 MG tablet Take 1 tablet (0.5 mg total) by mouth at bedtime as needed. Max 1 tablet per day. Patient not taking: Reported on  08/30/2023 01/29/23     Blood Glucose Monitoring Suppl (ACCU-CHEK GUIDE) w/Device KIT Use as directed to test blood glucose three times daily. 05/21/23     Continuous Glucose Sensor (FREESTYLE LIBRE 3 SENSOR) MISC Inject into the skin, change every 14 days 03/13/23     dapagliflozin propanediol (FARXIGA) 10 MG TABS tablet Take 1 tablet (10 mg total) by mouth daily. Patient not taking: Reported on 08/30/2023 08/21/23     dexlansoprazole (DEXILANT) 60 MG capsule Take 1 capsule (60 mg total) by mouth daily - 20 minutes before breakfast. Patient not taking: Reported on 08/30/2023 08/29/23     diclofenac (VOLTAREN) 75 MG EC tablet Take 1 tablet by mouth twice a day as needed for pain Patient not taking: Reported on 08/30/2023 06/04/23     docusate sodium (COLACE) 100 MG capsule Take 1 capsule (100 mg total) by mouth daily as needed. Patient not taking: Reported on 08/30/2023 08/28/23 08/27/24  Cristie Hem, PA-C  doxycycline (VIBRAMYCIN) 100 MG capsule Take 1 capsule (100 mg total) by mouth 2 (two) times daily. To be taken after surgery Patient not taking: Reported on 08/30/2023 08/28/23   Jari Sportsman L, PA-C  glucose blood test strip Use to check blood sugar 3 times a day 03/30/22     glucose blood test strip Use as directed to test blood glucose three times daily. 05/21/23     Lancets (FREESTYLE) lancets Use to check blood sugar 4 times a day 01/29/23     linaclotide (LINZESS) 290 MCG CAPS capsule Take 1 capsule (290 mcg total) by mouth 20 minutes before breakfast Patient not taking: Reported on 08/30/2023 03/06/22     methocarbamol (ROBAXIN-750) 750 MG tablet Take 1 tablet (750 mg total) by mouth 2 (two) times daily as needed for muscle spasms. Patient not taking: Reported on 08/30/2023 08/28/23   Cristie Hem, PA-C  ondansetron (ZOFRAN) 4 MG tablet Take 1 tablet (4 mg total) by mouth every 8 (eight) hours as needed for nausea or vomiting. Patient not taking: Reported on 08/30/2023 08/28/23   Cristie Hem,  PA-C  oxyCODONE-acetaminophen [facility]) [phone]  MG tablet Take 1-2 tablets by mouth every 6 (six) hours as needed. To be taken after surgery Patient not taking: Reported on 08/30/2023 08/28/23   Cristie Hem, PA-C  oxyCODONE-acetaminophen (PERCOCET/ROXICET) 5-325 MG tablet Take 1 tablet by mouth every 4 (four) hours as needed for pain Patient not taking: Reported on 08/30/2023 08/14/23     QUEtiapine (SEROQUEL XR) 300 MG 24 hr tablet Take 2 tablets (600 mg total) by mouth at bedtime. Patient not taking: Reported on 08/30/2023 03/26/23     rivaroxaban (XARELTO) 10 MG TABS tablet Take 1 tablet (10 mg total) by mouth daily. To be taken after surgery to prevent blood clots Patient not taking: Reported on 08/30/2023 08/28/23   Cristie Hem, PA-C  TRULANCE 3 MG TABS Take 1 tablet (3 mg total) by mouth daily as needed. 03/21/23        Positive ROS: All other systems have been reviewed and were otherwise negative with the exception of those mentioned in the HPI and as above.  Physical Exam: General: Alert, no acute distress Cardiovascular: No pedal edema Respiratory: No cyanosis, no use of accessory musculature GI: abdomen soft Skin: No lesions in the area of chief complaint Neurologic: Sensation intact distally Psychiatric: Patient is competent for consent with normal mood and affect Lymphatic: no lymphedema  MUSCULOSKELETAL: exam stable  Assessment: left knee osteoarthritis  Plan: Plan for Procedure(s): LEFT TOTAL KNEE ARTHROPLASTY  The risks benefits and alternatives were discussed with the patient including but not limited to the risks of nonoperative treatment, versus surgical intervention including infection, bleeding, nerve injury,  blood clots, cardiopulmonary complications, morbidity, mortality, among others, and they were willing to proceed.   Glee Arvin, MD 09/04/2023 6:30 AM

## 2023-09-04 NOTE — Op Note (Signed)
Total Knee Arthroplasty Procedure Note  Preoperative diagnosis: Left knee osteoarthritis  Postoperative diagnosis:same  Operative findings: Complete loss of joint space from medial compartment Grade 4 changes of lateral compartment  Operative procedure: Left total knee arthroplasty. CPT [phone]  Surgeon: N. Glee Arvin, MD  Assist: Oneal Grout, PA-C; necessary for the timely completion of procedure and due to complexity of procedure.  Anesthesia: Spinal, regional  Tourniquet time: see anesthesia record  Implants used: Zimmer persona press-fit Femur: CR 7 narrow Tibia: D Patella: 32 mm Polyethylene: 10 mm medial congruent  Indication: Julie Hansen is a 52 y.o. year old female with a history of knee pain. Having failed conservative management, the patient elected to proceed with a total knee arthroplasty.  We have reviewed the risk and benefits of the surgery and they elected to proceed after voicing understanding.  Procedure:  After informed consent was obtained and understanding of the risk were voiced including but not limited to bleeding, infection, damage to surrounding structures including nerves and vessels, blood clots, leg length inequality and the failure to achieve desired results, the operative extremity was marked with verbal confirmation of the patient in the holding area.   The patient was then brought to the operating room and transported to the operating room table in the supine position.  A tourniquet was applied to the operative extremity around the upper thigh. The operative limb was then prepped and draped in the usual sterile fashion and preoperative antibiotics were administered.  A time out was performed prior to the start of surgery confirming the correct extremity, preoperative antibiotic administration, as well as team members, implants and instruments available for the case. Correct surgical site was also confirmed with preoperative  radiographs. The limb was then elevated for exsanguination and the tourniquet was inflated. A midline incision was made and a standard medial parapatellar approach was performed.  The medial capsule was released back to the semimembranosus.  The patella was everted.  The patella was prepared and sized to a 32 mm resecting 10 mm.  A cover was placed on the patella for protection from retractors.  We then turned our attention to the femur.  The ACL was sacrificed. Start site was drilled in the femur and the intramedullary distal femoral cutting guide was placed, set at 5 degrees valgus, taking 10 mm of distal resection. The distal cut was made. Osteophytes were then removed.   Next, the proximal tibial cutting guide was placed with appropriate slope, varus/valgus alignment and depth of resection. A drop rod was attached to confirm that it was pointed to the second metatarsal.  The proximal tibial cut was made taking 2 mm off the lower medial side. Gap blocks were then used to assess the extension gap and alignment, and appropriate soft tissue releases were performed. Attention was turned back to the femur, which was sized using the sizing guide to a size 7. Appropriate rotation of the femoral component was determined using epicondylar axis, Whiteside's line, and assessing the flexion gap under ligament tension. The appropriate size 4-in-1 cutting block was placed and cuts were made.  Posterior femoral osteophytes and uncapped bone were then removed with the curved osteotome.  Trial components were placed, and stability was checked in full extension, mid-flexion, and deep flexion.  The PCL was retained.  The patella tracked well without a lateral release. Trial components were then removed and tibial preparation performed.  The tibial bone quality was excellent.   The tibial trial was pointed to  the medial third of the tibial tubercle.  The tibia was sized for a size D component.  Sclerotic bone was drilled. The  bony surfaces were irrigated with a pulse lavage and then dried. Final components were placed.  The stability of the construct was re-evaluated throughout a range of motion and found to be acceptable. The trial liner was removed, the knee was copiously irrigated, and the knee was re-evaluated for any excess bone debris. The real polyethylene liner, 10 mm thick, was inserted and checked to ensure the locking mechanism had engaged appropriately. The tourniquet was deflated and hemostasis was achieved. The wound was irrigated with normal saline.  One gram of vancomycin powder was placed in the surgical bed.  Topical mixture of 0.25% bupivacaine and meloxicam was placed in the joint for postoperative pain.  Capsular closure was performed with a #1 vicryl in flexion, subcutaneous fat closed with a 0 vicryl suture, then subcutaneous tissue closed with interrupted 2.0 vicryl suture. The skin was then closed with a 2.0 nylon and dermabond. A sterile dressing was applied.  The patient was awakened in the operating room and taken to recovery in stable condition. All sponge, needle, and instrument counts were correct at the end of the case.  Tessa Lerner was necessary for opening, closing, retracting, limb positioning and overall facilitation and completion of the surgery.  Position: supine  Complications: none.  Time Out: performed   Drains/Packing: none  Estimated blood loss: minimal  Returned to Recovery Room: in good condition.   Antibiotics: yes   Mechanical VTE (DVT) Prophylaxis: sequential compression devices, TED thigh-high  Chemical VTE (DVT) Prophylaxis: xarelto  Fluid Replacement  Crystalloid: see anesthesia record Blood: none  FFP: none   Specimens Removed: 1 to pathology   Sponge and Instrument Count Correct? yes   PACU: portable radiograph - knee AP and Lateral   Plan/RTC: Return in 2 weeks for wound check.   Weight Bearing/Load Lower Extremity: full   Implant Name Type Inv.  Item Serial No. Manufacturer Lot No. LRB No. Used Action  STEM TIB PS KNEE D 0D LT - [id number] Stem STEM TIB PS KNEE D 0D LT  ZIMMER RECON(ORTH,TRAU,BIO,SG) [id number] Left 1 Implanted  INSERT ARTISURF SZ 6-7 LT - [id number] Insert INSERT ARTISURF SZ 6-7 LT  ZIMMER RECON(ORTH,TRAU,BIO,SG) [id number] Left 1 Implanted  COMP PATELLA PEG 3 32 - [id number] Joint COMP PATELLA PEG 3 32  ZIMMER RECON(ORTH,TRAU,BIO,SG) [id number] Left 1 Implanted  COMP FEM PS KNEE NRW 7 LT - [id number] Joint COMP FEM PS KNEE NRW 7 LT  ZIMMER RECON(ORTH,TRAU,BIO,SG) [id number] Left 1 Implanted    N. Glee Arvin, MD [facility] 10:03 AM

## 2023-09-05 ENCOUNTER — Encounter (HOSPITAL_COMMUNITY): Payer: Self-pay | Admitting: Orthopaedic Surgery

## 2023-09-05 ENCOUNTER — Other Ambulatory Visit (HOSPITAL_COMMUNITY): Payer: Self-pay

## 2023-09-05 DIAGNOSIS — E119 Type 2 diabetes mellitus without complications: Secondary | ICD-10-CM | POA: Diagnosis not present

## 2023-09-05 DIAGNOSIS — G4733 Obstructive sleep apnea (adult) (pediatric): Secondary | ICD-10-CM | POA: Diagnosis not present

## 2023-09-05 DIAGNOSIS — Z7901 Long term (current) use of anticoagulants: Secondary | ICD-10-CM | POA: Diagnosis not present

## 2023-09-05 DIAGNOSIS — J45909 Unspecified asthma, uncomplicated: Secondary | ICD-10-CM | POA: Diagnosis not present

## 2023-09-05 DIAGNOSIS — Z794 Long term (current) use of insulin: Secondary | ICD-10-CM | POA: Diagnosis not present

## 2023-09-05 DIAGNOSIS — Z79899 Other long term (current) drug therapy: Secondary | ICD-10-CM | POA: Diagnosis not present

## 2023-09-05 DIAGNOSIS — M1712 Unilateral primary osteoarthritis, left knee: Secondary | ICD-10-CM | POA: Diagnosis not present

## 2023-09-05 DIAGNOSIS — Z96652 Presence of left artificial knee joint: Secondary | ICD-10-CM | POA: Diagnosis not present

## 2023-09-05 DIAGNOSIS — I10 Essential (primary) hypertension: Secondary | ICD-10-CM | POA: Diagnosis not present

## 2023-09-05 LAB — GLUCOSE, CAPILLARY
Glucose-Capillary: 116 mg/dL — ABNORMAL HIGH (ref 70–99)
Glucose-Capillary: 113 mg/dL — ABNORMAL HIGH (ref 70–99)

## 2023-09-05 MED ORDER — DIPHENHYDRAMINE HCL 25 MG PO CAPS
25.0000 mg | ORAL_CAPSULE | Freq: Four times a day (QID) | ORAL | Status: DC | PRN
  Administered 2023-09-05: 25 mg via ORAL
  Filled 2023-09-05: qty 1

## 2023-09-05 NOTE — Progress Notes (Signed)
Physical Therapy Treatment Patient Details Name: Julie Hansen MRN: [id number] DOB: [birth year] Today's Date: 09/05/2023   History of Present Illness 52 y.o. female presents to [facility] hospital on 09/04/2023 for elective L TKA. PMH includes DM, asthma, GERD, HLD, OSA, anxiety, migraines.    PT Comments  Pt received sitting in the recliner and agreeable to session. Pt demonstrates anticipated decreased L knee ROM and pain limiting activity tolerance. Pt able to tolerate gait and stair trials with up to CGA for safety. Pt reporting R ankle instability due to recent injury, but no buckling or LOB observed. Education provided on activity progression, safely navigating home set up for reduced fall risk, and optimal L knee positioning to prevent contracture. Pt also instructed in use of gait belt to assist lifting LLE into bed. Pt requests second PT session for husband to be present for gait and stair training prior to discharge. Pt continues to benefit from PT services to progress toward functional mobility goals.     If plan is discharge home, recommend the following: A little help with bathing/dressing/bathroom;Assistance with cooking/housework;Assist for transportation;Help with stairs or ramp for entrance   Can travel by private vehicle        Equipment Recommendations  None recommended by PT    Recommendations for Other Services       Precautions / Restrictions Precautions Precautions: Fall;Knee     Mobility  Bed Mobility               General bed mobility comments: Pt in recliner at beginning and end of session    Transfers Overall transfer level: Needs assistance Equipment used: Rolling walker (2 wheels) Transfers: Sit to/from Stand Sit to Stand: Contact guard assist           General transfer comment: increased time for power up and cues for hand placement    Ambulation/Gait Ambulation/Gait assistance: Supervision Gait Distance (Feet): 150 Feet Assistive  device: Rolling walker (2 wheels) Gait Pattern/deviations: Step-through pattern, Antalgic, Decreased stance time - left, Decreased stride length Gait velocity: reduced     General Gait Details: slow, antalgic gait with decreased LLE WB tolerance. Improved step-through pattern and fluid movement with progressed distance.   Stairs Stairs: Yes Stairs assistance: Contact guard assist Stair Management: With walker, Backwards Number of Stairs: 3 General stair comments: Backwards with RW with assist to stabilize RW and cues for sequencing/technique. Pt able to demonstrate with increased time and CGA for safety      Balance Overall balance assessment: Needs assistance Sitting-balance support: No upper extremity supported, Feet supported Sitting balance-Leahy Scale: Good     Standing balance support: Bilateral upper extremity supported, Reliant on assistive device for balance, During functional activity Standing balance-Leahy Scale: Poor Standing balance comment: with RW support                            Cognition Arousal: Alert Behavior During Therapy: WFL for tasks assessed/performed Overall Cognitive Status: Within Functional Limits for tasks assessed                                          Exercises Total Joint Exercises Knee Flexion: AROM, Seated, Left, 5 reps    General Comments General comments (skin integrity, edema, etc.): Pt reports mild lightheadedness during stair trial. BP 97/53 at end of session  Pertinent Vitals/Pain Pain Assessment Pain Assessment: 0-10 Pain Score: 7  Pain Location: L knee Pain Descriptors / Indicators: Aching, Guarding, Grimacing Pain Intervention(s): Monitored during session, Repositioned     PT Goals (current goals can now be found in the care plan section) Acute Rehab PT Goals Patient Stated Goal: to return to independence PT Goal Formulation: With patient Time For Goal Achievement:  09/08/23 Progress towards PT goals: Progressing toward goals    Frequency    Min 1X/week       AM-PAC PT "6 Clicks" Mobility   Outcome Measure  Help needed turning from your back to your side while in a flat bed without using bedrails?: A Little Help needed moving from lying on your back to sitting on the side of a flat bed without using bedrails?: A Little Help needed moving to and from a bed to a chair (including a wheelchair)?: A Little Help needed standing up from a chair using your arms (e.g., wheelchair or bedside chair)?: A Little Help needed to walk in hospital room?: A Little Help needed climbing 3-5 steps with a railing? : A Little 6 Click Score: 18    End of Session Equipment Utilized During Treatment: Gait belt Activity Tolerance: Patient tolerated treatment well Patient left: in chair;with family/visitor present;with call bell/phone within reach Nurse Communication: Mobility status PT Visit Diagnosis: Other abnormalities of gait and mobility (R26.89);Muscle weakness (generalized) (M62.81);Pain     Time: 1610-9604 PT Time Calculation (min) (ACUTE ONLY): 47 min  Charges:    $Gait Training: 23-37 mins $Self Care/Home Management: 8-22 PT General Charges $$ ACUTE PT VISIT: 1 Visit                     Johny Shock, PTA Acute Rehabilitation Services Secure Chat Preferred  [phone]    Johny Shock 09/05/2023, 9:47 AM

## 2023-09-05 NOTE — Discharge Summary (Signed)
Patient ID: Elvine Lauinger MRN: [id number] DOB/AGE: 04-27-1971 52 y.o.  Admit date: 09/04/2023 Discharge date: 09/05/2023  Admission Diagnoses:  Principal Problem:   Primary osteoarthritis of left knee Active Problems:   Status post revision of total replacement of left knee   Discharge Diagnoses:  Same  Past Medical History:  Diagnosis Date   Acid reflux    Anxiety    Asthma    Diabetes mellitus without complication (HCC)    Headache    migraines   Hyperlipidemia    Hypertension    Osteoarthritis of left knee    Palpitations    PCOS (polycystic ovarian syndrome)    PONV (postoperative nausea and vomiting)    Sleep apnea    uses cpap    Surgeries: Procedure(s): LEFT TOTAL KNEE ARTHROPLASTY on 09/04/2023   Consultants:   Discharged Condition: Improved  Hospital Course: Holliann Mccartt is an 52 y.o. female who was admitted 09/04/2023 for operative treatment ofPrimary osteoarthritis of left knee. Patient has severe unremitting pain that affects sleep, daily activities, and work/hobbies. After pre-op clearance the patient was taken to the operating room on 09/04/2023 and underwent  Procedure(s): LEFT TOTAL KNEE ARTHROPLASTY.    Patient was given perioperative antibiotics:  Anti-infectives (From admission, onward)    Start     Dose/Rate Route Frequency Ordered Stop   09/04/23 1400  ceFAZolin (ANCEF) IVPB 2g/100 mL premix        2 g 200 mL/hr over 30 Minutes Intravenous Every 6 hours 09/04/23 1221 09/04/23 2214   09/04/23 1230  doxycycline (VIBRA-TABS) tablet 100 mg       Note to Pharmacy: To be taken after surgery     100 mg Oral 2 times daily 09/04/23 1221     09/04/23 0915  vancomycin (VANCOCIN) powder  Status:  Discontinued          As needed 09/04/23 0915 09/04/23 1028   09/04/23 0645  ceFAZolin (ANCEF) IVPB 2g/100 mL premix        2 g 200 mL/hr over 30 Minutes Intravenous On call to O.R. 09/04/23 [id number] 09/04/23 0850        Patient was  given sequential compression devices, early ambulation, and chemoprophylaxis to prevent DVT.  Patient benefited maximally from hospital stay and there were no complications.    Recent vital signs: Patient Vitals for the past 24 hrs:  BP Temp Temp src Pulse Resp SpO2  09/04/23 2339 110/60 97.7 F (36.5 C) Oral 81 18 100 %  09/04/23 2009 99/69 97.8 F (36.6 C) Oral 66 18 100 %  09/04/23 1300 107/62 (!) 97.5 F (36.4 C) Oral 63 -- 99 %  09/04/23 1245 106/70 (!) 97.2 F (36.2 C) -- (!) 58 16 96 %  09/04/23 1230 122/66 -- -- 63 18 95 %  09/04/23 1215 115/80 -- -- 65 16 96 %  09/04/23 1200 113/83 -- -- 79 20 97 %  09/04/23 1145 103/63 -- -- 75 15 93 %  09/04/23 1130 (!) 111/99 -- -- 75 13 99 %  09/04/23 1128 (!) 111/99 -- -- 80 (!) 25 94 %  09/04/23 1115 94/75 -- -- 68 16 95 %  09/04/23 1106 -- -- -- 74 14 98 %  09/04/23 1100 110/78 -- -- 74 15 98 %  09/04/23 1054 -- -- -- 78 18 95 %  09/04/23 1045 101/71 -- -- 75 15 97 %  09/04/23 1032 93/63 98.1 F (36.7 C) -- 82 17 96 %     Recent laboratory  studies:  Recent Labs    09/04/23 0748  HGB 11.6*  HCT 34.0*  NA 138  K 3.5  CL 101  BUN 15  CREATININE 0.80  GLUCOSE 178*     Discharge Medications:   Allergies as of 09/05/2023       Reactions   Macrodantin [nitrofurantoin Macrocrystal] Itching   Carbamazepine Other (See Comments)   Vaginal discharge   Vraylar [cariprazine Hcl] Itching        Medication List     STOP taking these medications    baclofen 10 MG tablet Commonly known as: LIORESAL   cyclobenzaprine 10 MG tablet Commonly known as: FLEXERIL   dexlansoprazole 60 MG capsule Commonly known as: Dexilant   diclofenac 75 MG EC tablet Commonly known as: VOLTAREN   ibuprofen 800 MG tablet Commonly known as: ADVIL   Linzess 290 MCG Caps capsule Generic drug: linaclotide   meloxicam 15 MG tablet Commonly known as: MOBIC   potassium chloride 10 MEQ tablet Commonly known as: KLOR-CON M    QUEtiapine 300 MG 24 hr tablet Commonly known as: SEROQUEL XR   traMADol 50 MG tablet Commonly known as: ULTRAM       TAKE these medications    albuterol (2.5 MG/3ML) 0.083% nebulizer solution Commonly known as: PROVENTIL Take 2.5 mg by nebulization every 6 (six) hours as needed for wheezing or shortness of breath.   ALPRAZolam 1 MG tablet Commonly known as: XANAX Take 1/2 to 1 tablet as needed up to twice a day for severe anxiety. What changed:  medication strength how much to take when to take this reasons to take this Another medication with the same name was removed. Continue taking this medication, and follow the directions you see here.   atorvastatin 40 MG tablet Commonly known as: LIPITOR Take one tablet (40 mg dose) by mouth daily.   Botox 200 units injection Generic drug: botulinum toxin Type A Inject 200 Units into the muscle every 3 (three) months.   dapagliflozin propanediol 10 MG Tabs tablet Commonly known as: Farxiga Take 1 tablet (10 mg total) by mouth daily. What changed: Another medication with the same name was removed. Continue taking this medication, and follow the directions you see here.   docusate sodium 100 MG capsule Commonly known as: Colace Take 1 capsule (100 mg total) by mouth daily as needed.   doxycycline 100 MG capsule Commonly known as: Vibramycin Take 1 capsule (100 mg total) by mouth 2 (two) times daily. To be taken after surgery   freestyle lancets Use to check blood sugar 4 times a day   FreeStyle Libre 3 Sensor Misc Inject into the skin, change every 14 days   FREESTYLE LITE test strip Generic drug: glucose blood Use to check blood sugar 3 times a day   FREESTYLE LITE test strip Generic drug: glucose blood Use as directed to test blood glucose three times daily.   FreeStyle Lite w/Device Kit Use as directed to test blood glucose three times daily.   gabapentin 400 MG capsule Commonly known as: Neurontin Take 1  capsule (400 mg total) by mouth 3 (three) times daily.   insulin lispro 100 UNIT/ML KwikPen Commonly known as: HUMALOG Inject 20-25 Units into the skin 3 (three) times daily.   methocarbamol 750 MG tablet Commonly known as: Robaxin-750 Take 1 tablet (750 mg total) by mouth 2 (two) times daily as needed for muscle spasms.   metoprolol succinate 50 MG 24 hr tablet Commonly known as: TOPROL-XL Take 1  tablet (50 mg total) by mouth daily. Take with or immediately following a meal.   Mounjaro 15 MG/0.5ML Pen Generic drug: tirzepatide Inject 15 mg into the skin once a week.   omeprazole 40 MG capsule Commonly known as: PRILOSEC Take 1 capsule (40 mg total) by mouth daily.   ondansetron 4 MG tablet Commonly known as: Zofran Take 1 tablet (4 mg total) by mouth every 8 (eight) hours as needed for nausea or vomiting.   oxyCODONE-acetaminophen 5-325 MG tablet Commonly known as: PERCOCET/ROXICET Take 1 tablet by mouth every 4 (four) hours as needed for pain What changed: Another medication with the same name was removed. Continue taking this medication, and follow the directions you see here.   Potassium Chloride ER 20 MEQ Tbcr Take 1 tablet (20 mEq) by mouth daily.   promethazine 25 MG tablet Commonly known as: PHENERGAN Take 1 tablet (25 mg total) by mouth every 6 (six) hours as needed.   Qvar RediHaler 40 MCG/ACT inhaler Generic drug: beclomethasone Inhale 2 puffs into the lungs 2 times daily What changed:  how much to take when to take this reasons to take this   Reyvow 100 MG Tabs Generic drug: Lasmiditan Succinate Take 2 tablets (200 mg) by mouth as needed for headache   Tresiba FlexTouch 200 UNIT/ML FlexTouch Pen Generic drug: insulin degludec Inject 45 units into the skin in the morning and 74 units into the skin in the evening. What changed:  how much to take when to take this additional instructions   Trulance 3 MG Tabs Generic drug: Plecanatide Take 1 tablet  (3 mg total) by mouth daily. What changed: Another medication with the same name was removed. Continue taking this medication, and follow the directions you see here.   valsartan-hydrochlorothiazide 160-12.5 MG tablet Commonly known as: DIOVAN-HCT Take 1 tablet by mouth daily.   Vitamin D (Ergocalciferol) 1.25 MG (50000 UNIT) Caps capsule Commonly known as: DRISDOL Take 1 capsule (50,000 Units total) by mouth 2 (two) times a week.   Xarelto 10 MG Tabs tablet Generic drug: rivaroxaban Take 1 tablet (10 mg total) by mouth daily. To be taken after surgery to prevent blood clots   zolpidem 12.5 MG CR tablet Commonly known as: AMBIEN CR Take 1 tablet (12.5 mg total) by mouth at bedtime as needed. Max daily amount: 12.5mg  What changed:  when to take this additional instructions               Durable Medical Equipment  (From admission, onward)           Start     Ordered   09/04/23 1222  DME Walker rolling  Once       Question Answer Comment  Walker: With 5 Inch Wheels   Patient needs a walker to treat with the following condition Status post left partial knee replacement      09/04/23 1221   09/04/23 1222  DME 3 n 1  Once        09/04/23 1221   09/04/23 1222  DME Bedside commode  Once       Question:  Patient needs a bedside commode to treat with the following condition  Answer:  Status post left partial knee replacement   09/04/23 1221            Diagnostic Studies: DG Knee Left Port Result Date: 09/04/2023 CLINICAL DATA:  Left knee pain, postop. EXAM: PORTABLE LEFT KNEE - 1-2 VIEW COMPARISON:  None Available. FINDINGS: Left knee  arthroplasty in expected alignment. No periprosthetic lucency or fracture. Recent postsurgical change includes air and edema in the soft tissues and joint space. IMPRESSION: Left knee arthroplasty without immediate postoperative complication. Electronically Signed   By: Narda Rutherford M.D.   On: 09/04/2023 11:51   DG ESOPHAGUS W  DOUBLE CM (HD) Result Date: 08/19/2023 CLINICAL DATA:  Dysphagia for the past year. EXAM: ESOPHAGUS/BARIUM SWALLOW/TABLET STUDY TECHNIQUE: Combined double and single contrast examination was performed using effervescent crystals, high-density barium, and thin liquid barium. FLUOROSCOPY: 1 minute, 54 seconds (31.7 mGy) COMPARISON:  None Available. FINDINGS: Swallowing: Appears normal. No vestibular penetration or aspiration seen. Pharynx: Unremarkable. Esophagus: Normal appearance. Esophageal motility: Very mild esophageal dysmotility with tertiary contractions. Hiatal Hernia: None. Gastroesophageal reflux: There is a large amount of gastroesophageal reflux extending to the level of the cervical esophagus, best appreciated with the patient positioned supine. Ingested 13 mm barium tablet: Passed normally Other: None. IMPRESSION: Double contrast upper GI series significant for large amount of gastroesophageal reflux to the level of the cervical esophagus and mild esophageal dysmotility. Electronically Signed   By: Simonne Come M.D.   On: 08/19/2023 12:24    Disposition:      Follow-up Information     Cristie Hem, PA-C. Schedule an appointment as soon as possible for a visit in 2 week(s).   Specialty: Orthopedic Surgery Contact information: [address] [place] [address] [phone]         Eartha Inch, MD Follow up.   Specialty: Family Medicine Contact information: [address] [place] [address] [phone]         [facility] Follow up.   Why: home health services will be provided by [facility] , start of care within 48 hours post discharge Contact information: [address] [place] [address] [phone]                  Signed: Cristie Hem 09/05/2023, 7:55 AM

## 2023-09-05 NOTE — TOC Transition Note (Addendum)
Transition of Care [facility]) - Discharge Note   Patient Details  Name: Julie Hansen MRN: [id number] Date of Birth: 1971/08/12  [facility]) CM/SW Contact:  Epifanio Lesches, RN Phone Number: 09/05/2023, 11:03 AM   Clinical Narrative:    Patient will DC to: home Anticipated DC date: 09/05/2023 Family notified: yes Transport by: car     - Status post revision of total replacement of left knee   Per MD patient ready for DC today. RN, patient, patient's husband and Amy/ [facility] notified of DC. DME noted. Pt states has RW @ home. Referral for [facility] made with [employer]. Equipment will be delivered to bedside prior to d/c.  Pt without RX concerns or transportation concerns. Post hospital f/u noted on AVS.   12/12 11:45 am [employer] informed NCM  insurance out of network for DME. Referral made with Jerm[facility]/ [employer] and accepted. Equipment will be delivered to pt's home on tomorrow.  RNCM will sign off for now as intervention is no longer needed. Please consult [place] again if new needs arise.     Barriers to Discharge: No Barriers Identified   Patient Goals and CMS Choice     Choice offered to / list presented to : Patient      Discharge Placement                       Discharge Plan and Services Additional resources added to the After Visit Summary for                  DME Arranged: Bedside commode DME Agency: [employer] Date DME Agency Contacted: 09/05/23 Time DME Agency Contacted: 1102 Representative spoke with at DME Agency: Thompson [facility]er HH Arranged: PT HH Agency: [facility] Date [facility] Agency Contacted: 09/05/23 Time HH Agency Contacted: 1103 Representative spoke with at [facility] Agency: Amy  Social Drivers of Health (SDOH) Interventions SDOH Screenings   Food Insecurity: No Food Insecurity (09/04/2023)  Housing: Patient Declined (09/04/2023)  Transportation Needs: No Transportation Needs (09/04/2023)  Utilities: Not At Risk  (09/04/2023)  Depression (PHQ2-9): Low Risk  (05/11/2021)  Financial Resource Strain: Low Risk  (03/25/2023)   Received from [facility], [facility]  Physical Activity: Unknown (03/25/2023)   Received from [facility], [facility]  Social Connections: Socially Integrated (03/25/2023)   Received from [facility], [facility]  Stress: No Stress Concern Present (03/25/2023)   Received from [facility], [facility]  Tobacco Use: Low Risk  (09/04/2023)     Readmission Risk Interventions     No data to display

## 2023-09-05 NOTE — Evaluation (Signed)
Occupational Therapy Evaluation Patient Details Name: Julie Hansen MRN: [id number] DOB: Jul 27, 1971 Today's Date: 09/05/2023   History of Present Illness 52 y.o. female presents to [facility] hospital on 09/04/2023 for elective L TKA. PMH includes DM, asthma, GERD, HLD, OSA, anxiety, migraines.   Clinical Impression   PTA patient independent with ADLs.  Admitted for above and presents with problem list below. Patient currently requires min guard to supervision for transfers, up to min assist for Adls.  Educated on compensatory techniques for ADLs, AE, DME, as well as recommendations for fall prevention, safety and positioning/use of ice for L knee. Therapist demonstrated shower transfers using RW, but pt did not attempt this today. Will follow acutely to optimize independence and safety, but anticipate no further needs after dc home.       If plan is discharge home, recommend the following: A little help with walking and/or transfers;A little help with bathing/dressing/bathroom;Help with stairs or ramp for entrance;Assistance with cooking/housework    Functional Status Assessment  Patient has had a recent decline in their functional status and demonstrates the ability to make significant improvements in function in a reasonable and predictable amount of time.  Equipment Recommendations  BSC/3in1    Recommendations for Other Services       Precautions / Restrictions Precautions Precautions: Fall;Knee Restrictions Weight Bearing Restrictions Per Provider Order: Yes LLE Weight Bearing Per Provider Order: Weight bearing as tolerated      Mobility Bed Mobility               General bed mobility comments: in recliner    Transfers Overall transfer level: Needs assistance Equipment used: Rolling walker (2 wheels) Transfers: Sit to/from Stand Sit to Stand: Contact guard assist           General transfer comment: for safety, cueing for hand placement      Balance  Overall balance assessment: Needs assistance Sitting-balance support: No upper extremity supported, Feet supported Sitting balance-Leahy Scale: Good     Standing balance support: Bilateral upper extremity supported, During functional activity, Single extremity supported Standing balance-Leahy Scale: Poor Standing balance comment: with RW support, 1 UE support during ADls and min guard                           ADL either performed or assessed with clinical judgement   ADL Overall ADL's : Needs assistance/impaired     Grooming: Set up;Sitting           Upper Body Dressing : Set up;Sitting   Lower Body Dressing: Minimal assistance;Sit to/from stand Lower Body Dressing Details (indicate cue type and reason): pt  requires assist for L sock, min guard in standing; therapist demonstrated use of AE but pt declined need to practice Toilet Transfer: Contact guard assist;Ambulation;Rolling walker (2 wheels) Toilet Transfer Details (indicate cue type and reason): relies on UE support during transfers         Functional mobility during ADLs: Contact guard assist;Rolling walker (2 wheels)       Vision   Vision Assessment?: No apparent visual deficits     Perception         Praxis         Pertinent Vitals/Pain Pain Assessment Pain Assessment: Faces Faces Pain Scale: Hurts little more Pain Location: L knee Pain Descriptors / Indicators: Aching, Guarding, Grimacing Pain Intervention(s): Limited activity within patient's tolerance, Monitored during session, Repositioned     Extremity/Trunk Assessment Upper Extremity Assessment Upper  Extremity Assessment: Overall WFL for tasks assessed   Lower Extremity Assessment Lower Extremity Assessment: Defer to PT evaluation   Cervical / Trunk Assessment Cervical / Trunk Assessment: Normal   Communication Communication Communication: No apparent difficulties Cueing Techniques: Verbal cues   Cognition Arousal:  Alert Behavior During Therapy: WFL for tasks assessed/performed Overall Cognitive Status: Within Functional Limits for tasks assessed                                       General Comments  spouse present and supportive    Exercises     Shoulder Instructions      Home Living Family/patient expects to be discharged to:: Private residence Living Arrangements: Spouse/significant other Available Help at Discharge: Family Type of Home: House Home Access: Stairs to enter Secretary/administrator of Steps: 3 Entrance Stairs-Rails: None Home Layout: One level     Bathroom Shower/Tub: Producer, television/film/video: Standard     Home Equipment: Agricultural consultant (2 wheels);Crutches;Shower seat - built in;Shower seat          Prior Functioning/Environment Prior Level of Function : Independent/Modified Independent;Working/employed;Driving             Mobility Comments: typically ambulatory without device, was utilizing a crutch recently after injuring her R ankle ADLs Comments: independent ADLs, works as a Engineer, civil (consulting) at [employer] List: Decreased activity tolerance;Impaired balance (sitting and/or standing);Decreased safety awareness;Decreased knowledge of use of DME or AE;Decreased knowledge of precautions;Pain      OT Treatment/Interventions: Self-care/ADL training;Therapeutic exercise;DME and/or AE instruction;Therapeutic activities;Patient/family education;Balance training    OT Goals(Current goals can be found in the care plan section) Acute Rehab OT Goals Patient Stated Goal: home OT Goal Formulation: With patient Time For Goal Achievement: 09/19/23 Potential to Achieve Goals: Good  OT Frequency: Min 1X/week    Co-evaluation              AM-PAC OT "6 Clicks" Daily Activity     Outcome Measure Help from another person eating meals?: None Help from another person taking care of personal grooming?: A Little Help from another  person toileting, which includes using toliet, bedpan, or urinal?: A Little Help from another person bathing (including washing, rinsing, drying)?: A Little Help from another person to put on and taking off regular upper body clothing?: A Little Help from another person to put on and taking off regular lower body clothing?: A Little 6 Click Score: 19   End of Session Equipment Utilized During Treatment: Rolling walker (2 wheels);Gait belt Nurse Communication: Mobility status  Activity Tolerance: Patient tolerated treatment well Patient left: in chair;with call bell/phone within reach;with chair alarm set;with family/visitor present;with nursing/sitter in room  OT Visit Diagnosis: Other abnormalities of gait and mobility (R26.89);Pain Pain - Right/Left: Left Pain - part of body: Knee;Leg                Time: 1026-1100 OT Time Calculation (min): 34 min Charges:  OT General Charges $OT Visit: 1 Visit OT Evaluation $OT Eval Low Complexity: 1 Low OT Treatments $Self Care/Home Management : 8-22 mins  Barry Brunner, OT Acute Rehabilitation Services Office [phone]   Chancy Milroy 09/05/2023, 1:14 PM

## 2023-09-05 NOTE — Progress Notes (Signed)
Physical Therapy Treatment Patient Details Name: Julie Hansen MRN: [id number] DOB: [birth year] Today's Date: 09/05/2023   History of Present Illness 52 y.o. female presents to [facility] on 09/04/2023 for elective L TKA. PMH includes DM, asthma, GERD, HLD, OSA, anxiety, migraines.    PT Comments  Pt with identical presentation to first session. Educated husband on proper stair technique with RW. Pt and husband demonstrate good understanding. No change in DC/DME recs at this time. Pt anticipates DC home today.    If plan is discharge home, recommend the following: A little help with bathing/dressing/bathroom;Assistance with cooking/housework;Assist for transportation;Help with stairs or ramp for entrance   Can travel by private vehicle        Equipment Recommendations  None recommended by PT    Recommendations for Other Services       Precautions / Restrictions Precautions Precautions: Fall;Knee Restrictions Weight Bearing Restrictions Per Provider Order: Yes LLE Weight Bearing Per Provider Order: Weight bearing as tolerated     Mobility  Bed Mobility               General bed mobility comments: Pt in recliner at beginning and end of session    Transfers Overall transfer level: Needs assistance Equipment used: Rolling walker (2 wheels) Transfers: Sit to/from Stand Sit to Stand: Supervision           General transfer comment: increased time for power up and cues for hand placement    Ambulation/Gait Ambulation/Gait assistance: Supervision Gait Distance (Feet): 150 Feet Assistive device: Rolling walker (2 wheels) Gait Pattern/deviations: Step-through pattern, Antalgic, Decreased stance time - left, Decreased stride length Gait velocity: reduced     General Gait Details: slow, antalgic gait with decreased LLE WB tolerance. Improved step-through pattern and fluid movement with progressed distance.   Stairs   Stairs assistance: Contact guard  assist Stair Management: With walker, Backwards Number of Stairs: 2 General stair comments: Backwards with RW with assist to stabilize RW and cues for sequencing/technique. Pt able to demonstrate with increased time and CGA for safety   Wheelchair Mobility     Tilt Bed    Modified Rankin (Stroke Patients Only)       Balance Overall balance assessment: Needs assistance Sitting-balance support: No upper extremity supported, Feet supported Sitting balance-Leahy Scale: Good     Standing balance support: Bilateral upper extremity supported, Reliant on assistive device for balance, During functional activity Standing balance-Leahy Scale: Poor Standing balance comment: with RW support                            Cognition Arousal: Alert Behavior During Therapy: WFL for tasks assessed/performed Overall Cognitive Status: Within Functional Limits for tasks assessed                                          Exercises Total Joint Exercises Knee Flexion: AROM, Seated, Left, 5 reps    General Comments General comments (skin integrity, edema, etc.): VSS on RA      Pertinent Vitals/Pain Pain Assessment Pain Assessment: Faces Faces Pain Scale: Hurts little more Pain Location: L knee Pain Descriptors / Indicators: Aching, Guarding, Grimacing Pain Intervention(s): Monitored during session, RN gave pain meds during session    Home Living  Prior Function            PT Goals (current goals can now be found in the care plan section) Acute Rehab PT Goals Patient Stated Goal: to return to independence PT Goal Formulation: With patient Time For Goal Achievement: 09/08/23 Progress towards PT goals: Progressing toward goals    Frequency    Min 1X/week      PT Plan      Co-evaluation              AM-PAC PT "6 Clicks" Mobility   Outcome Measure  Help needed turning from your back to your side while in a  flat bed without using bedrails?: A Little Help needed moving from lying on your back to sitting on the side of a flat bed without using bedrails?: A Little Help needed moving to and from a bed to a chair (including a wheelchair)?: A Little Help needed standing up from a chair using your arms (e.g., wheelchair or bedside chair)?: A Little Help needed to walk in hospital room?: A Little Help needed climbing 3-5 steps with a railing? : A Little 6 Click Score: 18    End of Session Equipment Utilized During Treatment: Gait belt Activity Tolerance: Patient tolerated treatment well Patient left: in chair;with family/visitor present;with call bell/phone within reach Nurse Communication: Mobility status PT Visit Diagnosis: Other abnormalities of gait and mobility (R26.89);Muscle weakness (generalized) (M62.81);Pain     Time: 1610-9604 PT Time Calculation (min) (ACUTE ONLY): 25 min  Charges:    $Gait Training: 23-37 mins $Self Care/Home Management: 8-22 PT General Charges $$ ACUTE PT VISIT: 1 Visit                     Julie Hansen, PT, DPT Acute Rehab Services [phone]    Julie Hansen 09/05/2023, 12:22 PM

## 2023-09-05 NOTE — Progress Notes (Signed)
Subjective: 1 Day Post-Op Procedure(s) (LRB): LEFT TOTAL KNEE ARTHROPLASTY (Left) Patient reports pain as mild.  Doing well this am.   Objective: Vital signs in last 24 hours: Temp:  [97.2 F (36.2 C)-98.1 F (36.7 C)] 97.7 F (36.5 C) (12/11 2339) Pulse Rate:  [58-82] 81 (12/11 2339) Resp:  [13-25] 18 (12/11 2339) BP: (93-122)/(60-99) 110/60 (12/11 2339) SpO2:  [93 %-100 %] 100 % (12/11 2339)  Intake/Output from previous day: 12/11 0701 - 12/12 0700 In: 900 [I.V.:800; IV Piggyback:100] Out: 860 [Urine:850; Blood:10] Intake/Output this shift: No intake/output data recorded.  Recent Labs    09/04/23 0748  HGB 11.6*   Recent Labs    09/04/23 0748  HCT 34.0*   Recent Labs    09/04/23 0748  NA 138  K 3.5  CL 101  BUN 15  CREATININE 0.80  GLUCOSE 178*   No results for input(s): "LABPT", "INR" in the last 72 hours.  Neurologically intact Neurovascular intact Sensation intact distally Intact pulses distally Dorsiflexion/Plantar flexion intact Incision: dressing C/D/I No cellulitis present Compartment soft   Assessment/Plan: 1 Day Post-Op Procedure(s) (LRB): LEFT TOTAL KNEE ARTHROPLASTY (Left) Advance diet Up with therapy D/C IV fluids Discharge home with home health after second PT session as long as she is cleared by PT WBAT LLE       Cristie Hem 09/05/2023, 7:54 AM

## 2023-09-06 ENCOUNTER — Telehealth: Payer: Self-pay | Admitting: Orthopaedic Surgery

## 2023-09-06 DIAGNOSIS — Z7984 Long term (current) use of oral hypoglycemic drugs: Secondary | ICD-10-CM | POA: Diagnosis not present

## 2023-09-06 DIAGNOSIS — M1712 Unilateral primary osteoarthritis, left knee: Secondary | ICD-10-CM | POA: Diagnosis not present

## 2023-09-06 DIAGNOSIS — I10 Essential (primary) hypertension: Secondary | ICD-10-CM | POA: Diagnosis not present

## 2023-09-06 DIAGNOSIS — G43909 Migraine, unspecified, not intractable, without status migrainosus: Secondary | ICD-10-CM | POA: Diagnosis not present

## 2023-09-06 DIAGNOSIS — E785 Hyperlipidemia, unspecified: Secondary | ICD-10-CM | POA: Diagnosis not present

## 2023-09-06 DIAGNOSIS — Z471 Aftercare following joint replacement surgery: Secondary | ICD-10-CM | POA: Diagnosis not present

## 2023-09-06 DIAGNOSIS — J45909 Unspecified asthma, uncomplicated: Secondary | ICD-10-CM | POA: Diagnosis not present

## 2023-09-06 DIAGNOSIS — F419 Anxiety disorder, unspecified: Secondary | ICD-10-CM | POA: Diagnosis not present

## 2023-09-06 DIAGNOSIS — Z794 Long term (current) use of insulin: Secondary | ICD-10-CM | POA: Diagnosis not present

## 2023-09-06 DIAGNOSIS — E119 Type 2 diabetes mellitus without complications: Secondary | ICD-10-CM | POA: Diagnosis not present

## 2023-09-06 DIAGNOSIS — Z96652 Presence of left artificial knee joint: Secondary | ICD-10-CM | POA: Diagnosis not present

## 2023-09-06 NOTE — Telephone Encounter (Signed)
Nate from [facility] called. Would like verbal orders for PT. 1x 1wk, 2x 2wk. His cb# [phone]

## 2023-09-06 NOTE — Telephone Encounter (Signed)
Called and gave verbal via voicemail.

## 2023-09-09 ENCOUNTER — Telehealth: Payer: Self-pay | Admitting: Orthopaedic Surgery

## 2023-09-09 DIAGNOSIS — F419 Anxiety disorder, unspecified: Secondary | ICD-10-CM | POA: Diagnosis not present

## 2023-09-09 DIAGNOSIS — J45909 Unspecified asthma, uncomplicated: Secondary | ICD-10-CM | POA: Diagnosis not present

## 2023-09-09 DIAGNOSIS — E785 Hyperlipidemia, unspecified: Secondary | ICD-10-CM | POA: Diagnosis not present

## 2023-09-09 DIAGNOSIS — Z794 Long term (current) use of insulin: Secondary | ICD-10-CM | POA: Diagnosis not present

## 2023-09-09 DIAGNOSIS — Z96652 Presence of left artificial knee joint: Secondary | ICD-10-CM | POA: Diagnosis not present

## 2023-09-09 DIAGNOSIS — Z7984 Long term (current) use of oral hypoglycemic drugs: Secondary | ICD-10-CM | POA: Diagnosis not present

## 2023-09-09 DIAGNOSIS — I10 Essential (primary) hypertension: Secondary | ICD-10-CM | POA: Diagnosis not present

## 2023-09-09 DIAGNOSIS — E119 Type 2 diabetes mellitus without complications: Secondary | ICD-10-CM | POA: Diagnosis not present

## 2023-09-09 DIAGNOSIS — G43909 Migraine, unspecified, not intractable, without status migrainosus: Secondary | ICD-10-CM | POA: Diagnosis not present

## 2023-09-09 DIAGNOSIS — Z471 Aftercare following joint replacement surgery: Secondary | ICD-10-CM | POA: Diagnosis not present

## 2023-09-09 NOTE — Telephone Encounter (Signed)
Brad from [facility] called stating pt had a fall in the Bathroom with no injuries pt fell yesterday

## 2023-09-09 NOTE — Telephone Encounter (Signed)
Ok, thanks.

## 2023-09-10 ENCOUNTER — Encounter: Payer: Self-pay | Admitting: Orthopaedic Surgery

## 2023-09-10 ENCOUNTER — Telehealth: Payer: Self-pay | Admitting: Orthopaedic Surgery

## 2023-09-10 ENCOUNTER — Other Ambulatory Visit (HOSPITAL_COMMUNITY): Payer: Self-pay

## 2023-09-10 DIAGNOSIS — Z794 Long term (current) use of insulin: Secondary | ICD-10-CM | POA: Diagnosis not present

## 2023-09-10 DIAGNOSIS — G43909 Migraine, unspecified, not intractable, without status migrainosus: Secondary | ICD-10-CM | POA: Diagnosis not present

## 2023-09-10 DIAGNOSIS — E785 Hyperlipidemia, unspecified: Secondary | ICD-10-CM | POA: Diagnosis not present

## 2023-09-10 DIAGNOSIS — Z7984 Long term (current) use of oral hypoglycemic drugs: Secondary | ICD-10-CM | POA: Diagnosis not present

## 2023-09-10 DIAGNOSIS — G4733 Obstructive sleep apnea (adult) (pediatric): Secondary | ICD-10-CM | POA: Diagnosis not present

## 2023-09-10 DIAGNOSIS — I10 Essential (primary) hypertension: Secondary | ICD-10-CM | POA: Diagnosis not present

## 2023-09-10 DIAGNOSIS — Z471 Aftercare following joint replacement surgery: Secondary | ICD-10-CM | POA: Diagnosis not present

## 2023-09-10 DIAGNOSIS — F419 Anxiety disorder, unspecified: Secondary | ICD-10-CM | POA: Diagnosis not present

## 2023-09-10 DIAGNOSIS — E119 Type 2 diabetes mellitus without complications: Secondary | ICD-10-CM | POA: Diagnosis not present

## 2023-09-10 DIAGNOSIS — J45909 Unspecified asthma, uncomplicated: Secondary | ICD-10-CM | POA: Diagnosis not present

## 2023-09-10 DIAGNOSIS — Z96652 Presence of left artificial knee joint: Secondary | ICD-10-CM | POA: Diagnosis not present

## 2023-09-10 NOTE — Telephone Encounter (Signed)
Will (OT) from [facility] requesting verbal orders for 1wk 2, and 2wk 1. Will secure number is [phone].

## 2023-09-10 NOTE — Telephone Encounter (Signed)
Looks like she is on neurontin already so cannot do that.  On xarelto so cannot add toradol or nsaids.  I would recommend increasing frequency of muscle relaxer to three or four times per day.

## 2023-09-10 NOTE — Telephone Encounter (Signed)
Called and gave verbal 

## 2023-09-10 NOTE — Telephone Encounter (Signed)
At least until we see her when she is two weeks po.  Will determine then based on swelling

## 2023-09-11 ENCOUNTER — Other Ambulatory Visit (HOSPITAL_COMMUNITY): Payer: Self-pay

## 2023-09-11 ENCOUNTER — Ambulatory Visit (HOSPITAL_COMMUNITY)
Admission: RE | Admit: 2023-09-11 | Discharge: 2023-09-11 | Disposition: A | Discharge status: Home / Self Care | Payer: Commercial Managed Care - PPO | Source: Ambulatory Visit | Attending: Orthopaedic Surgery | Admitting: Orthopaedic Surgery

## 2023-09-11 ENCOUNTER — Other Ambulatory Visit: Payer: Self-pay | Admitting: Physician Assistant

## 2023-09-11 ENCOUNTER — Telehealth: Payer: Self-pay

## 2023-09-11 ENCOUNTER — Other Ambulatory Visit: Payer: Self-pay

## 2023-09-11 DIAGNOSIS — M79662 Pain in left lower leg: Secondary | ICD-10-CM | POA: Diagnosis not present

## 2023-09-11 DIAGNOSIS — M7989 Other specified soft tissue disorders: Secondary | ICD-10-CM

## 2023-09-11 MED ORDER — OXYCODONE-ACETAMINOPHEN 7.5-325 MG PO TABS
1.0000 | ORAL_TABLET | Freq: Four times a day (QID) | ORAL | 0 refills | Status: DC | PRN
  Filled 2023-09-11: qty 30, 4d supply, fill #0

## 2023-09-11 NOTE — Telephone Encounter (Signed)
[facility] called stating that patient is Negative for DVT, left leg, but left leg is very warm to the touch, swollen, and painful.  Please advise.  Thank you.

## 2023-09-11 NOTE — Telephone Encounter (Signed)
Glad it is negative.  Recommend ice elevation compression

## 2023-09-11 NOTE — Telephone Encounter (Signed)
Yes, please. Thank you.

## 2023-09-11 NOTE — Telephone Encounter (Signed)
Notified patient via MyChart

## 2023-09-11 NOTE — Telephone Encounter (Signed)
No ibuprofen or any other nsaids while on xarelto.  I have sent in percocet and increased the dose

## 2023-09-12 ENCOUNTER — Encounter: Payer: Self-pay | Admitting: Orthopaedic Surgery

## 2023-09-12 DIAGNOSIS — G43909 Migraine, unspecified, not intractable, without status migrainosus: Secondary | ICD-10-CM | POA: Diagnosis not present

## 2023-09-12 DIAGNOSIS — Z471 Aftercare following joint replacement surgery: Secondary | ICD-10-CM | POA: Diagnosis not present

## 2023-09-12 DIAGNOSIS — E119 Type 2 diabetes mellitus without complications: Secondary | ICD-10-CM | POA: Diagnosis not present

## 2023-09-12 DIAGNOSIS — F419 Anxiety disorder, unspecified: Secondary | ICD-10-CM | POA: Diagnosis not present

## 2023-09-12 DIAGNOSIS — E785 Hyperlipidemia, unspecified: Secondary | ICD-10-CM | POA: Diagnosis not present

## 2023-09-12 DIAGNOSIS — Z794 Long term (current) use of insulin: Secondary | ICD-10-CM | POA: Diagnosis not present

## 2023-09-12 DIAGNOSIS — Z7984 Long term (current) use of oral hypoglycemic drugs: Secondary | ICD-10-CM | POA: Diagnosis not present

## 2023-09-12 DIAGNOSIS — J45909 Unspecified asthma, uncomplicated: Secondary | ICD-10-CM | POA: Diagnosis not present

## 2023-09-12 DIAGNOSIS — Z96652 Presence of left artificial knee joint: Secondary | ICD-10-CM | POA: Diagnosis not present

## 2023-09-12 DIAGNOSIS — I10 Essential (primary) hypertension: Secondary | ICD-10-CM | POA: Diagnosis not present

## 2023-09-14 ENCOUNTER — Other Ambulatory Visit (HOSPITAL_COMMUNITY): Payer: Self-pay

## 2023-09-16 ENCOUNTER — Other Ambulatory Visit: Payer: Self-pay | Admitting: Physician Assistant

## 2023-09-16 ENCOUNTER — Encounter: Payer: Self-pay | Admitting: Adult Health Nurse Practitioner

## 2023-09-16 ENCOUNTER — Telehealth: Payer: Self-pay | Admitting: Orthopaedic Surgery

## 2023-09-16 ENCOUNTER — Other Ambulatory Visit (HOSPITAL_COMMUNITY): Payer: Self-pay

## 2023-09-16 ENCOUNTER — Other Ambulatory Visit: Payer: Self-pay | Admitting: Orthopaedic Surgery

## 2023-09-16 MED ORDER — OXYCODONE-ACETAMINOPHEN 7.5-325 MG PO TABS
1.0000 | ORAL_TABLET | Freq: Four times a day (QID) | ORAL | 0 refills | Status: DC | PRN
  Filled 2023-09-16: qty 30, 4d supply, fill #0

## 2023-09-16 MED ORDER — METHOCARBAMOL 750 MG PO TABS
750.0000 mg | ORAL_TABLET | Freq: Two times a day (BID) | ORAL | 2 refills | Status: DC | PRN
  Filled 2023-09-16 – 2023-09-24 (×2): qty 30, 15d supply, fill #0
  Filled 2023-10-14: qty 30, 15d supply, fill #1
  Filled 2023-12-12: qty 30, 15d supply, fill #2

## 2023-09-16 NOTE — Telephone Encounter (Signed)
Request sent to Dr.Xu

## 2023-09-16 NOTE — Telephone Encounter (Signed)
Patient Called and need a refill on Oxycodone. WU#[phone]

## 2023-09-17 DIAGNOSIS — Z471 Aftercare following joint replacement surgery: Secondary | ICD-10-CM | POA: Diagnosis not present

## 2023-09-17 DIAGNOSIS — Z7984 Long term (current) use of oral hypoglycemic drugs: Secondary | ICD-10-CM | POA: Diagnosis not present

## 2023-09-17 DIAGNOSIS — F419 Anxiety disorder, unspecified: Secondary | ICD-10-CM | POA: Diagnosis not present

## 2023-09-17 DIAGNOSIS — Z794 Long term (current) use of insulin: Secondary | ICD-10-CM | POA: Diagnosis not present

## 2023-09-17 DIAGNOSIS — G43909 Migraine, unspecified, not intractable, without status migrainosus: Secondary | ICD-10-CM | POA: Diagnosis not present

## 2023-09-17 DIAGNOSIS — J45909 Unspecified asthma, uncomplicated: Secondary | ICD-10-CM | POA: Diagnosis not present

## 2023-09-17 DIAGNOSIS — Z96652 Presence of left artificial knee joint: Secondary | ICD-10-CM | POA: Diagnosis not present

## 2023-09-17 DIAGNOSIS — E119 Type 2 diabetes mellitus without complications: Secondary | ICD-10-CM | POA: Diagnosis not present

## 2023-09-17 DIAGNOSIS — I10 Essential (primary) hypertension: Secondary | ICD-10-CM | POA: Diagnosis not present

## 2023-09-17 DIAGNOSIS — E785 Hyperlipidemia, unspecified: Secondary | ICD-10-CM | POA: Diagnosis not present

## 2023-09-18 NOTE — Progress Notes (Unsigned)
Post-Op Visit Note   Patient: Julie Hansen           Date of Birth: 22-Feb-1971           MRN: [id number] Visit Date: 09/19/2023 PCP: Eartha Inch, MD   Assessment & Plan:  Chief Complaint: No chief complaint on file.  Visit Diagnoses:  1. Status post total left knee replacement     Plan: ***  Follow-Up Instructions: No follow-ups on file.   Orders:  No orders of the defined types were placed in this encounter.  No orders of the defined types were placed in this encounter.   Imaging: No results found.  PMFS History: Patient Active Problem List   Diagnosis Date Noted   Status post total left knee replacement 09/04/2023   Chronic migraine without aura without status migrainosus, not intractable 04/14/2021   Muscle spasm 06/20/2018   Unspecified dyspareunia (CODE) 03/20/2017   Amenorrhea 03/20/2017   Irritable bowel syndrome 01/01/2017   Status migrainosus 01/01/2017   Anxiety 01/01/2017   Vaginal discharge 12/17/2016   GERD (gastroesophageal reflux disease) 10/30/2016   Mixed hyperlipidemia 10/30/2016   Diabetes type 2, controlled (HCC) 09/12/2015   OSA (obstructive sleep apnea) 02/16/2015   Asthma 10/29/2011   Past Medical History:  Diagnosis Date   Acid reflux    Anxiety    Asthma    Diabetes mellitus without complication (HCC)    Headache    migraines   Hyperlipidemia    Hypertension    Osteoarthritis of left knee    Palpitations    PCOS (polycystic ovarian syndrome)    PONV (postoperative nausea and vomiting)    Sleep apnea    uses cpap    Family History  Problem Relation Age of Onset   Cancer Mother    Hypertension Mother    Diabetes Mother    Breast cancer Mother 81   Cancer Maternal Grandmother    Breast cancer Maternal Grandmother 68   Cancer Father    Diabetes Father    Hypertension Father    Breast cancer Paternal Aunt 30   Breast cancer Paternal Aunt 45   Breast cancer Paternal Aunt 68    Past Surgical History:   Procedure Laterality Date   BREAST BIOPSY Left 04/09/2023   BREAST BIOPSY Left 04/09/2023   [place] LT BREAST BX W LOC DEV 1ST LESION IMG BX SPEC US GUIDE 04/09/2023 [facility]   BREAST BIOPSY Left 04/17/2023   MM LT BREAST BX W LOC DEV 1ST LESION IMAGE BX SPEC STEREO GUIDE 04/17/2023 [facility]   BREAST EXCISIONAL BIOPSY Right 06/28/2020   papilloma   DILATION AND EVACUATION N/A 06/05/2017   Procedure: DILATATION AND EVACUATION;  Surgeon: Essie Hart, MD;  Location: [facility];  Service: Gynecology;  Laterality: N/A;   KNEE ARTHROSCOPY WITH MENISCAL REPAIR Left 09/20/2021   Procedure: LEFT KNEE PARTIAL MEDIAL MENISCECTOMY;  Surgeon: Tarry Kos, MD;  Location: [facility];  Service: Orthopedics;  Laterality: Left;   RADIOACTIVE SEED GUIDED EXCISIONAL BREAST BIOPSY Right 06/28/2020   Procedure: RIGHT RADIOACTIVE SEED GUIDED EXCISIONAL BREAST BIOPSY;  Surgeon: Emelia Loron, MD;  Location: [facility];  Service: General;  Laterality: Right;   TOTAL KNEE ARTHROPLASTY Left 09/04/2023   Procedure: LEFT TOTAL KNEE ARTHROPLASTY;  Surgeon: Tarry Kos, MD;  Location: [facility];  Service: Orthopedics;  Laterality: Left;   WISDOM TOOTH EXTRACTION     Social History   Occupational History   Not on file  Tobacco Use   Smoking status: Never    Passive exposure: Never   Smokeless tobacco: Never  Vaping Use   Vaping status: Never Used  Substance and Sexual Activity   Alcohol use: Yes    Comment: maybe a drink once a month   Drug use: No   Sexual activity: Yes    Birth control/protection: None

## 2023-09-19 ENCOUNTER — Ambulatory Visit: Payer: Commercial Managed Care - PPO | Admitting: Orthopaedic Surgery

## 2023-09-19 ENCOUNTER — Other Ambulatory Visit (HOSPITAL_COMMUNITY): Payer: Self-pay

## 2023-09-19 ENCOUNTER — Encounter: Payer: Self-pay | Admitting: Orthopaedic Surgery

## 2023-09-19 DIAGNOSIS — G43909 Migraine, unspecified, not intractable, without status migrainosus: Secondary | ICD-10-CM | POA: Diagnosis not present

## 2023-09-19 DIAGNOSIS — Z794 Long term (current) use of insulin: Secondary | ICD-10-CM | POA: Diagnosis not present

## 2023-09-19 DIAGNOSIS — Z96652 Presence of left artificial knee joint: Secondary | ICD-10-CM | POA: Diagnosis not present

## 2023-09-19 DIAGNOSIS — Z7984 Long term (current) use of oral hypoglycemic drugs: Secondary | ICD-10-CM | POA: Diagnosis not present

## 2023-09-19 DIAGNOSIS — J45909 Unspecified asthma, uncomplicated: Secondary | ICD-10-CM | POA: Diagnosis not present

## 2023-09-19 DIAGNOSIS — I10 Essential (primary) hypertension: Secondary | ICD-10-CM | POA: Diagnosis not present

## 2023-09-19 DIAGNOSIS — E119 Type 2 diabetes mellitus without complications: Secondary | ICD-10-CM | POA: Diagnosis not present

## 2023-09-19 DIAGNOSIS — F419 Anxiety disorder, unspecified: Secondary | ICD-10-CM | POA: Diagnosis not present

## 2023-09-19 DIAGNOSIS — Z471 Aftercare following joint replacement surgery: Secondary | ICD-10-CM | POA: Diagnosis not present

## 2023-09-19 DIAGNOSIS — E785 Hyperlipidemia, unspecified: Secondary | ICD-10-CM | POA: Diagnosis not present

## 2023-09-23 ENCOUNTER — Other Ambulatory Visit: Payer: Commercial Managed Care - PPO

## 2023-09-23 DIAGNOSIS — E785 Hyperlipidemia, unspecified: Secondary | ICD-10-CM | POA: Diagnosis not present

## 2023-09-23 DIAGNOSIS — E119 Type 2 diabetes mellitus without complications: Secondary | ICD-10-CM | POA: Diagnosis not present

## 2023-09-23 DIAGNOSIS — F419 Anxiety disorder, unspecified: Secondary | ICD-10-CM | POA: Diagnosis not present

## 2023-09-23 DIAGNOSIS — Z794 Long term (current) use of insulin: Secondary | ICD-10-CM | POA: Diagnosis not present

## 2023-09-23 DIAGNOSIS — G43909 Migraine, unspecified, not intractable, without status migrainosus: Secondary | ICD-10-CM | POA: Diagnosis not present

## 2023-09-23 DIAGNOSIS — Z471 Aftercare following joint replacement surgery: Secondary | ICD-10-CM | POA: Diagnosis not present

## 2023-09-23 DIAGNOSIS — J45909 Unspecified asthma, uncomplicated: Secondary | ICD-10-CM | POA: Diagnosis not present

## 2023-09-23 DIAGNOSIS — I10 Essential (primary) hypertension: Secondary | ICD-10-CM | POA: Diagnosis not present

## 2023-09-23 DIAGNOSIS — Z7984 Long term (current) use of oral hypoglycemic drugs: Secondary | ICD-10-CM | POA: Diagnosis not present

## 2023-09-23 DIAGNOSIS — Z96652 Presence of left artificial knee joint: Secondary | ICD-10-CM | POA: Diagnosis not present

## 2023-09-24 ENCOUNTER — Other Ambulatory Visit: Payer: Self-pay | Admitting: Physician Assistant

## 2023-09-24 ENCOUNTER — Other Ambulatory Visit (HOSPITAL_COMMUNITY): Payer: Self-pay

## 2023-09-24 DIAGNOSIS — Z471 Aftercare following joint replacement surgery: Secondary | ICD-10-CM | POA: Diagnosis not present

## 2023-09-24 DIAGNOSIS — Z96652 Presence of left artificial knee joint: Secondary | ICD-10-CM | POA: Diagnosis not present

## 2023-09-24 DIAGNOSIS — Z794 Long term (current) use of insulin: Secondary | ICD-10-CM | POA: Diagnosis not present

## 2023-09-24 DIAGNOSIS — G43909 Migraine, unspecified, not intractable, without status migrainosus: Secondary | ICD-10-CM | POA: Diagnosis not present

## 2023-09-24 DIAGNOSIS — E119 Type 2 diabetes mellitus without complications: Secondary | ICD-10-CM | POA: Diagnosis not present

## 2023-09-24 DIAGNOSIS — Z7984 Long term (current) use of oral hypoglycemic drugs: Secondary | ICD-10-CM | POA: Diagnosis not present

## 2023-09-24 DIAGNOSIS — I10 Essential (primary) hypertension: Secondary | ICD-10-CM | POA: Diagnosis not present

## 2023-09-24 DIAGNOSIS — E785 Hyperlipidemia, unspecified: Secondary | ICD-10-CM | POA: Diagnosis not present

## 2023-09-24 DIAGNOSIS — J45909 Unspecified asthma, uncomplicated: Secondary | ICD-10-CM | POA: Diagnosis not present

## 2023-09-24 DIAGNOSIS — F419 Anxiety disorder, unspecified: Secondary | ICD-10-CM | POA: Diagnosis not present

## 2023-09-24 MED ORDER — ATORVASTATIN CALCIUM 40 MG PO TABS
40.0000 mg | ORAL_TABLET | Freq: Every day | ORAL | 3 refills | Status: DC
  Filled 2023-09-24: qty 90, 90d supply, fill #0
  Filled 2023-12-07: qty 90, 90d supply, fill #1
  Filled 2024-04-11: qty 90, 90d supply, fill #2
  Filled 2024-07-09: qty 30, 30d supply, fill #3
  Filled 2024-08-08: qty 60, 60d supply, fill #4
  Filled 2024-08-15: qty 30, 30d supply, fill #4
  Filled 2024-09-14 – 2024-09-15 (×2): qty 30, 30d supply, fill #5

## 2023-09-24 MED ORDER — ALPRAZOLAM 1 MG PO TABS
0.5000 mg | ORAL_TABLET | Freq: Two times a day (BID) | ORAL | 0 refills | Status: AC | PRN
  Filled 2023-09-24 – 2023-10-02 (×3): qty 60, 30d supply, fill #0

## 2023-09-24 MED ORDER — HYDROCODONE-ACETAMINOPHEN 5-325 MG PO TABS
1.0000 | ORAL_TABLET | Freq: Two times a day (BID) | ORAL | 0 refills | Status: DC | PRN
  Filled 2023-09-24: qty 30, 8d supply, fill #0

## 2023-09-24 NOTE — Telephone Encounter (Signed)
Only needed for 4 weeks po

## 2023-09-24 NOTE — Telephone Encounter (Signed)
Called patient

## 2023-09-24 NOTE — Telephone Encounter (Signed)
Weaning to norco and sent in

## 2023-09-24 NOTE — Telephone Encounter (Signed)
Xarelto x 4 weeks post-op and then aspirin 81 mg bid x two weeks after that for a total of 6 weeks on blood thinners

## 2023-09-26 ENCOUNTER — Other Ambulatory Visit: Payer: Self-pay

## 2023-09-26 ENCOUNTER — Other Ambulatory Visit (HOSPITAL_COMMUNITY): Payer: Self-pay

## 2023-09-26 DIAGNOSIS — Z96652 Presence of left artificial knee joint: Secondary | ICD-10-CM | POA: Diagnosis not present

## 2023-09-26 DIAGNOSIS — G43909 Migraine, unspecified, not intractable, without status migrainosus: Secondary | ICD-10-CM | POA: Diagnosis not present

## 2023-09-26 DIAGNOSIS — Z794 Long term (current) use of insulin: Secondary | ICD-10-CM | POA: Diagnosis not present

## 2023-09-26 DIAGNOSIS — I10 Essential (primary) hypertension: Secondary | ICD-10-CM | POA: Diagnosis not present

## 2023-09-26 DIAGNOSIS — F419 Anxiety disorder, unspecified: Secondary | ICD-10-CM | POA: Diagnosis not present

## 2023-09-26 DIAGNOSIS — E785 Hyperlipidemia, unspecified: Secondary | ICD-10-CM | POA: Diagnosis not present

## 2023-09-26 DIAGNOSIS — J45909 Unspecified asthma, uncomplicated: Secondary | ICD-10-CM | POA: Diagnosis not present

## 2023-09-26 DIAGNOSIS — E119 Type 2 diabetes mellitus without complications: Secondary | ICD-10-CM | POA: Diagnosis not present

## 2023-09-26 DIAGNOSIS — Z7984 Long term (current) use of oral hypoglycemic drugs: Secondary | ICD-10-CM | POA: Diagnosis not present

## 2023-09-26 DIAGNOSIS — Z471 Aftercare following joint replacement surgery: Secondary | ICD-10-CM | POA: Diagnosis not present

## 2023-09-27 ENCOUNTER — Other Ambulatory Visit (HOSPITAL_COMMUNITY): Payer: Self-pay

## 2023-09-30 ENCOUNTER — Other Ambulatory Visit (HOSPITAL_COMMUNITY): Payer: Self-pay

## 2023-09-30 ENCOUNTER — Encounter: Payer: Self-pay | Admitting: Orthopaedic Surgery

## 2023-09-30 ENCOUNTER — Encounter: Payer: Self-pay | Admitting: Physical Therapy

## 2023-09-30 ENCOUNTER — Ambulatory Visit (INDEPENDENT_AMBULATORY_CARE_PROVIDER_SITE_OTHER): Payer: Commercial Managed Care - PPO | Admitting: Physical Therapy

## 2023-09-30 ENCOUNTER — Other Ambulatory Visit: Payer: Self-pay | Admitting: Physician Assistant

## 2023-09-30 DIAGNOSIS — M25662 Stiffness of left knee, not elsewhere classified: Secondary | ICD-10-CM

## 2023-09-30 DIAGNOSIS — R600 Localized edema: Secondary | ICD-10-CM | POA: Diagnosis not present

## 2023-09-30 DIAGNOSIS — M6281 Muscle weakness (generalized): Secondary | ICD-10-CM | POA: Diagnosis not present

## 2023-09-30 DIAGNOSIS — M25562 Pain in left knee: Secondary | ICD-10-CM

## 2023-09-30 MED ORDER — OXYCODONE HCL 5 MG PO TABS
ORAL_TABLET | ORAL | 0 refills | Status: DC
  Filled 2023-09-30: qty 10, 10d supply, fill #0

## 2023-09-30 NOTE — Therapy (Signed)
 OUTPATIENT PHYSICAL THERAPY EVALUATION   Patient Name: Julie Hansen MRN: [id number] DOB:Oct 02, 1970, 53 y.o., female Today's Date: 09/30/2023  END OF SESSION:  PT End of Session - 09/30/23 1049     Visit Number 1    Number of Visits 16    Date for PT Re-Evaluation 11/25/23    PT Start Time 1015    PT Stop Time 1100    PT Time Calculation (min) 45 min    Activity Tolerance Patient tolerated treatment well    Behavior During Therapy WFL for tasks assessed/performed             Past Medical History:  Diagnosis Date   Acid reflux    Anxiety    Asthma    Diabetes mellitus without complication (HCC)    Headache    migraines   Hyperlipidemia    Hypertension    Osteoarthritis of left knee    Palpitations    PCOS (polycystic ovarian syndrome)    PONV (postoperative nausea and vomiting)    Sleep apnea    uses cpap   Past Surgical History:  Procedure Laterality Date   BREAST BIOPSY Left 04/09/2023   BREAST BIOPSY Left 04/09/2023   US  LT BREAST BX W LOC DEV 1ST LESION IMG BX SPEC US  GUIDE 04/09/2023 [facility]   BREAST BIOPSY Left 04/17/2023   MM LT BREAST BX W LOC DEV 1ST LESION IMAGE BX SPEC STEREO GUIDE 04/17/2023 [facility]   BREAST EXCISIONAL BIOPSY Right 06/28/2020   papilloma   DILATION AND EVACUATION N/A 06/05/2017   Procedure: DILATATION AND EVACUATION;  Surgeon: Bettina Muskrat, MD;  Location: [facility];  Service: Gynecology;  Laterality: N/A;   KNEE ARTHROSCOPY WITH MENISCAL REPAIR Left 09/20/2021   Procedure: LEFT KNEE PARTIAL MEDIAL MENISCECTOMY;  Surgeon: Jerri Kay HERO, MD;  Location: [facility];  Service: Orthopedics;  Laterality: Left;   RADIOACTIVE SEED GUIDED EXCISIONAL BREAST BIOPSY Right 06/28/2020   Procedure: RIGHT RADIOACTIVE SEED GUIDED EXCISIONAL BREAST BIOPSY;  Surgeon: Ebbie Cough, MD;  Location: [facility];  Service: General;  Laterality: Right;   TOTAL KNEE ARTHROPLASTY Left 09/04/2023    Procedure: LEFT TOTAL KNEE ARTHROPLASTY;  Surgeon: Jerri Kay HERO, MD;  Location: [facility];  Service: Orthopedics;  Laterality: Left;   WISDOM TOOTH EXTRACTION     Patient Active Problem List   Diagnosis Date Noted   Status post total left knee replacement 09/04/2023   Chronic migraine without aura without status migrainosus, not intractable 04/14/2021   Muscle spasm 06/20/2018   Unspecified dyspareunia (CODE) 03/20/2017   Amenorrhea 03/20/2017   Irritable bowel syndrome 01/01/2017   Status migrainosus 01/01/2017   Anxiety 01/01/2017   Vaginal discharge 12/17/2016   GERD (gastroesophageal reflux disease) 10/30/2016   Mixed hyperlipidemia 10/30/2016   Diabetes type 2, controlled (HCC) 09/12/2015   OSA (obstructive sleep apnea) 02/16/2015   Asthma 10/29/2011    PCP: Sophronia Ozell BROCKS, MD  REFERRING PROVIDER: Jerri Kay HERO, MD  REFERRING DIAG: [phone] (ICD-10-CM) - Status post total left knee replacement  Rationale for Evaluation and Treatment: Rehabilitation  THERAPY DIAG:  Acute pain of left knee  Stiffness of left knee, not elsewhere classified  Muscle weakness (generalized)  Localized edema  ONSET DATE: DOS: 09/04/23   SUBJECTIVE:  SUBJECTIVE STATEMENT: She had left TKA 09/04/23, had HHPT until last Thursday. Pain has been bad over the last 2 weeks  PERTINENT HISTORY:  DM, asthma, GERD, HLD, OSA, anxiety, migraines.  PAIN:  NPRS scale: 5/10 upon arrival Pain location:all over but worse in anterior medial knee Pain description: constant, achy, sharp Aggravating factors: sitting too long, car rides Relieving factors: ice,  meds   PRECAUTIONS:  None  RED FLAGS: None   WEIGHT BEARING RESTRICTIONS:  No  FALLS:  Has patient fallen in last 6 months? Yes. Number of falls 2.Feel  back in bathrooom and fell back on toilet  OCCUPATION:  Nurse  PLOF:  Independent  PATIENT GOALS:  Reduce pain, get back to normal function, get rid of walker   OBJECTIVE:   DIAGNOSTIC FINDINGS:  09/04/23 XR IMPRESSION: Left knee arthroplasty without immediate postoperative complication.  PATIENT SURVEYS:  09/30/23 FOTO 40% functional, goal is 64%  COGNITIVE STATUS: Within functional limits for tasks assessed   SENSATION: WFL  GAIT: 09/30/23 Ambulates in with RW independently, does not use AD PLOF for community ambulation   EDEMA: 09/30/23  Knee joint line: Moderate edema noted in left knee   LOWER EXTREMITY ROM:     ROM Left eval  Knee flexion A: 90 P: 99  Knee extension A: 6 P: 4   (Blank rows = not tested)   LOWER EXTREMITY MMT:    MMT Right eval Left eval  Knee flexion 5 4  Knee extension 5 4   (Blank rows = not tested)    TODAY'S TREATMENT:  Eval HEP creation and review with demonstration  Heelslides AAROM 5 sec X 10 Nu step L4 X 8 min UE/LE  Manual therapy for Left knee PROM with overpressure to tolerance  -Vasopnuematic device X 15 min, medium compression, 34 deg to Rt knee     PATIENT EDUCATION: Education details: HEP, PT plan of care Person educated: Patient Education method: Explanation, Demonstration, Verbal cues, and Handouts Education comprehension: verbalized understanding and needs further education   HOME EXERCISE PROGRAM: Access Code: [email or web address] URL: [email or web address] Date: 09/30/2023 Prepared by: Redell Moose  Exercises - Supine Heel Slide with Strap  - 2 x daily - 6 x weekly - 1-2 sets - 10 reps - 5 hold - Supine Knee Extension Stretch on Towel Roll  - 2 x daily - 6 x weekly - 1 sets - 10 reps - 5 sec hold - seated knee flexion stretch with knee extension strengthening  - 2 x daily - 6 x weekly - 1 sets - 15 reps - 5 sec hold - Seated Long Arc Quad (Mirrored)  - 2 x daily - 6 x weekly - 2 sets -  10 reps - Seated Straight Leg Raise with Quad Contraction  - 2 x daily - 6 x weekly - 2 sets - 10 reps - Sit to Stand with Armchair  - 2 x daily - 6 x weekly - 2 sets - 5 reps - alternating marching  - 2 x daily - 6 x weekly - 1 sets - 10 reps  ASSESSMENT:  CLINICAL IMPRESSION: Patient referred to PT S/P left TKA on 09/04/23. She is doing quite well up to point but will benefit from skilled PT to address below impairments, limitations and improve overall function.  OBJECTIVE IMPAIRMENTS: decreased activity tolerance, difficulty walking, decreased balance, decreased endurance, decreased functional mobility, decreased ROM, decreased strength, impaired flexibility, impaired LE use, and pain.  ACTIVITY LIMITATIONS: bending, lifting, carry, locomotion, cleaning, community  activity, driving, and or occupation  PERSONAL FACTORS: see above PMH are also affecting patient's functional outcome.  REHAB POTENTIAL: Excellent  CLINICAL DECISION MAKING: Stable/uncomplicated  EVALUATION COMPLEXITY: Low    GOALS: Short term PT Goals Target date: 10/28/2023  Pt will be I and compliant with HEP. Baseline:  Goal status: New Pt will decrease pain by 25% overall Baseline: Goal status: New  Long term PT goals Target date:11/25/2023   Pt will improve left ROM 0-120 deg to improve functional mobility Baseline: Goal status: New Pt will improve  hip/knee strength to at least 5-/5 MMT to improve functional strength Baseline: Goal status: New Pt will improve FOTO to at least 64% functional to show improved function Baseline: Goal status: New Pt will reduce pain to overall less than 2-3/10 with usual activity and work activity. Baseline: Goal status: New Pt will be able to ambulate community distances at least 1000 ft WNL gait pattern without complaints Baseline: Goal status: New  PLAN: PT FREQUENCY: 1-3 times per week   PT DURATION: 6-8 weeks  PLANNED INTERVENTIONS (unless contraindicated):  aquatic PT, Canalith repositioning, cryotherapy, Electrical stimulation, Iontophoresis with 4 mg/ml dexamethasome, Moist heat, traction, Ultrasound, gait training, Therapeutic exercise, balance training, neuromuscular re-education, patient/family education,manual techniques, passive ROM, dry needling, taping, vasopnuematic device, vestibular, spinal manipulations, joint manipulations 97110-Therapeutic exercises, 97530- Therapeutic activity, 97112- Neuromuscular re-education, 97535- Self Care, and [id number]- Manual therapy  PLAN FOR NEXT SESSION: knee ROM,strengthening and gait progressions as tolerated Vaso to end  Redell Moose, PT, DPT 09/30/23 11:19 AM

## 2023-09-30 NOTE — Telephone Encounter (Signed)
 Sent in small rx for PT

## 2023-10-02 ENCOUNTER — Ambulatory Visit: Payer: Commercial Managed Care - PPO | Admitting: Physical Therapy

## 2023-10-02 ENCOUNTER — Other Ambulatory Visit: Payer: Self-pay | Admitting: *Deleted

## 2023-10-02 ENCOUNTER — Other Ambulatory Visit (HOSPITAL_BASED_OUTPATIENT_CLINIC_OR_DEPARTMENT_OTHER): Payer: Self-pay

## 2023-10-02 ENCOUNTER — Encounter: Payer: Self-pay | Admitting: Physical Therapy

## 2023-10-02 ENCOUNTER — Other Ambulatory Visit: Payer: Self-pay

## 2023-10-02 ENCOUNTER — Other Ambulatory Visit (HOSPITAL_COMMUNITY): Payer: Self-pay

## 2023-10-02 DIAGNOSIS — M25562 Pain in left knee: Secondary | ICD-10-CM | POA: Diagnosis not present

## 2023-10-02 DIAGNOSIS — R600 Localized edema: Secondary | ICD-10-CM | POA: Diagnosis not present

## 2023-10-02 DIAGNOSIS — M6281 Muscle weakness (generalized): Secondary | ICD-10-CM

## 2023-10-02 DIAGNOSIS — M25662 Stiffness of left knee, not elsewhere classified: Secondary | ICD-10-CM

## 2023-10-02 MED ORDER — BACLOFEN 10 MG PO TABS
10.0000 mg | ORAL_TABLET | ORAL | 2 refills | Status: DC | PRN
  Filled 2023-10-02 (×2): qty 30, 30d supply, fill #0
  Filled 2023-10-24 – 2023-10-28 (×2): qty 30, 30d supply, fill #1
  Filled 2023-11-25: qty 30, 30d supply, fill #2

## 2023-10-02 MED ORDER — CYCLOBENZAPRINE HCL 10 MG PO TABS
10.0000 mg | ORAL_TABLET | Freq: Three times a day (TID) | ORAL | 1 refills | Status: DC
  Filled 2023-10-02: qty 30, 10d supply, fill #0
  Filled 2023-10-24: qty 30, 10d supply, fill #1

## 2023-10-02 MED ORDER — FLUOXETINE HCL 10 MG PO CAPS
10.0000 mg | ORAL_CAPSULE | Freq: Every day | ORAL | 1 refills | Status: DC
  Filled 2023-10-02: qty 90, 90d supply, fill #0
  Filled 2024-01-01: qty 90, 90d supply, fill #1

## 2023-10-02 NOTE — Therapy (Signed)
 OUTPATIENT PHYSICAL THERAPY TREATMENT   Patient Name: Julie Hansen MRN: [id number] DOB:1971-02-25, 53 y.o., female Today's Date: 10/02/2023  END OF SESSION:  PT End of Session - 10/02/23 1103     Visit Number 2    Number of Visits 16    Date for PT Re-Evaluation 11/25/23    Authorization Type UMR    PT Start Time 1015    PT Stop Time 1103    PT Time Calculation (min) 48 min    Activity Tolerance Patient tolerated treatment well    Behavior During Therapy WFL for tasks assessed/performed             Past Medical History:  Diagnosis Date   Acid reflux    Anxiety    Asthma    Diabetes mellitus without complication (HCC)    Headache    migraines   Hyperlipidemia    Hypertension    Osteoarthritis of left knee    Palpitations    PCOS (polycystic ovarian syndrome)    PONV (postoperative nausea and vomiting)    Sleep apnea    uses cpap   Past Surgical History:  Procedure Laterality Date   BREAST BIOPSY Left 04/09/2023   BREAST BIOPSY Left 04/09/2023   US  LT BREAST BX W LOC DEV 1ST LESION IMG BX SPEC US  GUIDE 04/09/2023 [facility]   BREAST BIOPSY Left 04/17/2023   MM LT BREAST BX W LOC DEV 1ST LESION IMAGE BX SPEC STEREO GUIDE 04/17/2023 [facility]   BREAST EXCISIONAL BIOPSY Right 06/28/2020   papilloma   DILATION AND EVACUATION N/A 06/05/2017   Procedure: DILATATION AND EVACUATION;  Surgeon: Bettina Muskrat, MD;  Location: [facility];  Service: Gynecology;  Laterality: N/A;   KNEE ARTHROSCOPY WITH MENISCAL REPAIR Left 09/20/2021   Procedure: LEFT KNEE PARTIAL MEDIAL MENISCECTOMY;  Surgeon: Jerri Kay HERO, MD;  Location: [facility];  Service: Orthopedics;  Laterality: Left;   RADIOACTIVE SEED GUIDED EXCISIONAL BREAST BIOPSY Right 06/28/2020   Procedure: RIGHT RADIOACTIVE SEED GUIDED EXCISIONAL BREAST BIOPSY;  Surgeon: Ebbie Cough, MD;  Location: [facility];  Service: General;  Laterality: Right;   TOTAL KNEE  ARTHROPLASTY Left 09/04/2023   Procedure: LEFT TOTAL KNEE ARTHROPLASTY;  Surgeon: Jerri Kay HERO, MD;  Location: [facility];  Service: Orthopedics;  Laterality: Left;   WISDOM TOOTH EXTRACTION     Patient Active Problem List   Diagnosis Date Noted   Status post total left knee replacement 09/04/2023   Chronic migraine without aura without status migrainosus, not intractable 04/14/2021   Muscle spasm 06/20/2018   Unspecified dyspareunia (CODE) 03/20/2017   Amenorrhea 03/20/2017   Irritable bowel syndrome 01/01/2017   Status migrainosus 01/01/2017   Anxiety 01/01/2017   Vaginal discharge 12/17/2016   GERD (gastroesophageal reflux disease) 10/30/2016   Mixed hyperlipidemia 10/30/2016   Diabetes type 2, controlled (HCC) 09/12/2015   OSA (obstructive sleep apnea) 02/16/2015   Asthma 10/29/2011    PCP: Sophronia Ozell BROCKS, MD  REFERRING PROVIDER: Jerri Kay HERO, MD  REFERRING DIAG: [phone] (ICD-10-CM) - Status post total left knee replacement  Rationale for Evaluation and Treatment: Rehabilitation  THERAPY DIAG:  Acute pain of left knee  Stiffness of left knee, not elsewhere classified  Muscle weakness (generalized)  Localized edema  ONSET DATE: DOS: 09/04/23   SUBJECTIVE:  SUBJECTIVE STATEMENT: Knee is hurting more upon arrival.   PERTINENT HISTORY:  DM, asthma, GERD, HLD, OSA, anxiety, migraines.  PAIN:  NPRS scale: 6/10 upon arrival Pain location:all over but worse in anterior medial knee Pain description: constant, achy, sharp Aggravating factors: sitting too long, car rides Relieving factors: ice,  meds   PRECAUTIONS:  None  RED FLAGS: None   WEIGHT BEARING RESTRICTIONS:  No  FALLS:  Has patient fallen in last 6 months? Yes. Number of falls 2.Feel back in bathrooom and fell  back on toilet  OCCUPATION:  Nurse  PLOF:  Independent  PATIENT GOALS:  Reduce pain, get back to normal function, get rid of walker   OBJECTIVE:   DIAGNOSTIC FINDINGS:  09/04/23 XR IMPRESSION: Left knee arthroplasty without immediate postoperative complication.  PATIENT SURVEYS:  09/30/23 FOTO 40% functional, goal is 64%  COGNITIVE STATUS: Within functional limits for tasks assessed   SENSATION: WFL  GAIT: 09/30/23 Ambulates in with RW independently, does not use AD PLOF for community ambulation   EDEMA: 09/30/23  Knee joint line: Moderate edema noted in left knee   LOWER EXTREMITY ROM:     ROM Left eval  Knee flexion A: 90 P: 99  Knee extension A: 6 P: 4   (Blank rows = not tested)   LOWER EXTREMITY MMT:    MMT Right eval Left eval  Knee flexion 5 4  Knee extension 5 4   (Blank rows = not tested)    TODAY'S TREATMENT:  10/02/23 Supine Heelslides AAROM 5 sec X 10 Supine quad sets with heel prop 5 sec X 10 Seated heelslides AAROM 5 sec X 10 Slantboard stretch 30 sec X 3 Heel and toe raises X 15 bilat Step ups 2 inch step leading with left X 10 reps, one UE support Nu step L5 X 8 min UE/LE  Manual therapy for Left knee PROM with overpressure to tolerance  -Vasopnuematic device X 15 min, medium compression, 34 deg to Rt knee   Eval HEP creation and review with demonstration  Heelslides AAROM 5 sec X 10 Nu step L4 X 8 min UE/LE  Manual therapy for Left knee PROM with overpressure to tolerance  -Vasopnuematic device X 15 min, medium compression, 34 deg to Rt knee     PATIENT EDUCATION: Education details: HEP, PT plan of care Person educated: Patient Education method: Explanation, Demonstration, Verbal cues, and Handouts Education comprehension: verbalized understanding and needs further education   HOME EXERCISE PROGRAM: Access Code: [email or web address] URL: [email or web address] Date: 09/30/2023 Prepared by: Redell Moose  Exercises - Supine Heel Slide with Strap  - 2 x daily - 6 x weekly - 1-2 sets - 10 reps - 5 hold - Supine Knee Extension Stretch on Towel Roll  - 2 x daily - 6 x weekly - 1 sets - 10 reps - 5 sec hold - seated knee flexion stretch with knee extension strengthening  - 2 x daily - 6 x weekly - 1 sets - 15 reps - 5 sec hold - Seated Long Arc Quad (Mirrored)  - 2 x daily - 6 x weekly - 2 sets - 10 reps - Seated Straight Leg Raise with Quad Contraction  - 2 x daily - 6 x weekly - 2 sets - 10 reps - Sit to Stand with Armchair  - 2 x daily - 6 x weekly - 2 sets - 5 reps - alternating marching  - 2 x daily - 6 x weekly - 1 sets - 10  reps  ASSESSMENT:  CLINICAL IMPRESSION: Knee ROM improving and she had fair to good tolerance to exercises and PT stretching her knee. She gets good pain relief and edema reduction from vaso.   OBJECTIVE IMPAIRMENTS: decreased activity tolerance, difficulty walking, decreased balance, decreased endurance, decreased functional mobility, decreased ROM, decreased strength, impaired flexibility, impaired LE use, and pain.  ACTIVITY LIMITATIONS: bending, lifting, carry, locomotion, cleaning, community activity, driving, and or occupation  PERSONAL FACTORS: see above PMH are also affecting patient's functional outcome.  REHAB POTENTIAL: Excellent  CLINICAL DECISION MAKING: Stable/uncomplicated  EVALUATION COMPLEXITY: Low    GOALS: Short term PT Goals Target date: 10/28/2023  Pt will be I and compliant with HEP. Baseline:  Goal status: New Pt will decrease pain by 25% overall Baseline: Goal status: New  Long term PT goals Target date:11/25/2023   Pt will improve left ROM 0-120 deg to improve functional mobility Baseline: Goal status: New Pt will improve  hip/knee strength to at least 5-/5 MMT to improve functional strength Baseline: Goal status: New Pt will improve FOTO to at least 64% functional to show improved function Baseline: Goal status:  New Pt will reduce pain to overall less than 2-3/10 with usual activity and work activity. Baseline: Goal status: New Pt will be able to ambulate community distances at least 1000 ft WNL gait pattern without complaints Baseline: Goal status: New  PLAN: PT FREQUENCY: 1-3 times per week   PT DURATION: 6-8 weeks  PLANNED INTERVENTIONS (unless contraindicated): aquatic PT, Canalith repositioning, cryotherapy, Electrical stimulation, Iontophoresis with 4 mg/ml dexamethasome, Moist heat, traction, Ultrasound, gait training, Therapeutic exercise, balance training, neuromuscular re-education, patient/family education,manual techniques, passive ROM, dry needling, taping, vasopnuematic device, vestibular, spinal manipulations, joint manipulations 97110-Therapeutic exercises, 97530- Therapeutic activity, 97112- Neuromuscular re-education, 97535- Self Care, and [id number]- Manual therapy  PLAN FOR NEXT SESSION: knee ROM,strengthening and gait progressions as tolerated Vaso to end  Redell Moose, PT, DPT 10/02/23 11:04 AM

## 2023-10-03 ENCOUNTER — Other Ambulatory Visit (HOSPITAL_COMMUNITY): Payer: Self-pay

## 2023-10-03 ENCOUNTER — Ambulatory Visit: Payer: Commercial Managed Care - PPO | Admitting: Physical Therapy

## 2023-10-03 ENCOUNTER — Telehealth: Payer: Self-pay | Admitting: Orthopaedic Surgery

## 2023-10-03 ENCOUNTER — Encounter: Payer: Self-pay | Admitting: Physical Therapy

## 2023-10-03 DIAGNOSIS — M6281 Muscle weakness (generalized): Secondary | ICD-10-CM | POA: Diagnosis not present

## 2023-10-03 DIAGNOSIS — M25562 Pain in left knee: Secondary | ICD-10-CM | POA: Diagnosis not present

## 2023-10-03 DIAGNOSIS — R600 Localized edema: Secondary | ICD-10-CM

## 2023-10-03 DIAGNOSIS — M25662 Stiffness of left knee, not elsewhere classified: Secondary | ICD-10-CM

## 2023-10-03 NOTE — Therapy (Signed)
 OUTPATIENT PHYSICAL THERAPY TREATMENT   Patient Name: Julie Hansen MRN: [id number] DOB:30-Nov-1970, 53 y.o., female Today's Date: 10/03/2023  END OF SESSION:  PT End of Session - 10/03/23 1204     Visit Number 3    Number of Visits 16    Date for PT Re-Evaluation 11/25/23    Authorization Type UMR    PT Start Time 1150    PT Stop Time 1235    PT Time Calculation (min) 45 min    Activity Tolerance Patient tolerated treatment well    Behavior During Therapy WFL for tasks assessed/performed             Past Medical History:  Diagnosis Date   Acid reflux    Anxiety    Asthma    Diabetes mellitus without complication (HCC)    Headache    migraines   Hyperlipidemia    Hypertension    Osteoarthritis of left knee    Palpitations    PCOS (polycystic ovarian syndrome)    PONV (postoperative nausea and vomiting)    Sleep apnea    uses cpap   Past Surgical History:  Procedure Laterality Date   BREAST BIOPSY Left 04/09/2023   BREAST BIOPSY Left 04/09/2023   US  LT BREAST BX W LOC DEV 1ST LESION IMG BX SPEC US  GUIDE 04/09/2023 [facility]   BREAST BIOPSY Left 04/17/2023   MM LT BREAST BX W LOC DEV 1ST LESION IMAGE BX SPEC STEREO GUIDE 04/17/2023 [facility]   BREAST EXCISIONAL BIOPSY Right 06/28/2020   papilloma   DILATION AND EVACUATION N/A 06/05/2017   Procedure: DILATATION AND EVACUATION;  Surgeon: Bettina Muskrat, MD;  Location: [facility];  Service: Gynecology;  Laterality: N/A;   KNEE ARTHROSCOPY WITH MENISCAL REPAIR Left 09/20/2021   Procedure: LEFT KNEE PARTIAL MEDIAL MENISCECTOMY;  Surgeon: Jerri Kay HERO, MD;  Location: [facility];  Service: Orthopedics;  Laterality: Left;   RADIOACTIVE SEED GUIDED EXCISIONAL BREAST BIOPSY Right 06/28/2020   Procedure: RIGHT RADIOACTIVE SEED GUIDED EXCISIONAL BREAST BIOPSY;  Surgeon: Ebbie Cough, MD;  Location: [facility];  Service: General;  Laterality: Right;   TOTAL KNEE  ARTHROPLASTY Left 09/04/2023   Procedure: LEFT TOTAL KNEE ARTHROPLASTY;  Surgeon: Jerri Kay HERO, MD;  Location: [facility];  Service: Orthopedics;  Laterality: Left;   WISDOM TOOTH EXTRACTION     Patient Active Problem List   Diagnosis Date Noted   Status post total left knee replacement 09/04/2023   Chronic migraine without aura without status migrainosus, not intractable 04/14/2021   Muscle spasm 06/20/2018   Unspecified dyspareunia (CODE) 03/20/2017   Amenorrhea 03/20/2017   Irritable bowel syndrome 01/01/2017   Status migrainosus 01/01/2017   Anxiety 01/01/2017   Vaginal discharge 12/17/2016   GERD (gastroesophageal reflux disease) 10/30/2016   Mixed hyperlipidemia 10/30/2016   Diabetes type 2, controlled (HCC) 09/12/2015   OSA (obstructive sleep apnea) 02/16/2015   Asthma 10/29/2011    PCP: Sophronia Ozell BROCKS, MD  REFERRING PROVIDER: Jerri Kay HERO, MD  REFERRING DIAG: [phone] (ICD-10-CM) - Status post total left knee replacement  Rationale for Evaluation and Treatment: Rehabilitation  THERAPY DIAG:  Acute pain of left knee  Stiffness of left knee, not elsewhere classified  Muscle weakness (generalized)  Localized edema  ONSET DATE: DOS: 09/04/23   SUBJECTIVE:  SUBJECTIVE STATEMENT: Knee is feeling okay overall, took pain med before coming   PERTINENT HISTORY:  DM, asthma, GERD, HLD, OSA, anxiety, migraines.  PAIN:  NPRS scale: 5/10 upon arrival Pain location:all over but worse in anterior medial knee Pain description: constant, achy, sharp Aggravating factors: sitting too long, car rides Relieving factors: ice,  meds   PRECAUTIONS:  None  RED FLAGS: None   WEIGHT BEARING RESTRICTIONS:  No  FALLS:  Has patient fallen in last 6 months? Yes. Number of falls 2.Feel back in  bathrooom and fell back on toilet  OCCUPATION:  Nurse  PLOF:  Independent  PATIENT GOALS:  Reduce pain, get back to normal function, get rid of walker   OBJECTIVE:   DIAGNOSTIC FINDINGS:  09/04/23 XR IMPRESSION: Left knee arthroplasty without immediate postoperative complication.  PATIENT SURVEYS:  09/30/23 FOTO 40% functional, goal is 64%  COGNITIVE STATUS: Within functional limits for tasks assessed   SENSATION: WFL  GAIT: 09/30/23 Ambulates in with RW independently, does not use AD PLOF for community ambulation   EDEMA: 09/30/23  Knee joint line: Moderate edema noted in left knee   LOWER EXTREMITY ROM:     ROM Left eval Left 10/02/22  Knee flexion A: 90 P: 99 P:105  Knee extension A: 6 P: 4 A:3   (Blank rows = not tested)   LOWER EXTREMITY MMT:    MMT Right eval Left eval  Knee flexion 5 4  Knee extension 5 4   (Blank rows = not tested)    TODAY'S TREATMENT:  10/03/23 Sci fit bike rocking first 2 min then full revolutions for 6 more minutes (8 min total) Leg press 62# DL [id number], then left leg only 25# X10 stretching in max flexion Walking in bars 2 round trips forward, and 2 more Supine Heelslides AAROM 5 sec X 10 Slantboard stretch 30 sec X 3 Step ups 4 inch step leading with left X 10 reps, 2 UE support  Manual therapy for Left knee PROM with overpressure to tolerance  -Vasopnuematic device X 10 min, medium compression, 34 deg to Rt knee  10/02/23 Supine Heelslides AAROM 5 sec X 10 Supine quad sets with heel prop 5 sec X 10 Seated heelslides AAROM 5 sec X 10 Slantboard stretch 30 sec X 3 Heel and toe raises X 15 bilat Step ups 2 inch step leading with left X 10 reps, one UE support Nu step L5 X 8 min UE/LE  Manual therapy for Left knee PROM with overpressure to tolerance  -Vasopnuematic device X 15 min, medium compression, 34 deg to Rt knee   Eval HEP creation and review with demonstration  Heelslides AAROM 5 sec X 10 Nu step L4 X 8  min UE/LE  Manual therapy for Left knee PROM with overpressure to tolerance  -Vasopnuematic device X 15 min, medium compression, 34 deg to Rt knee     PATIENT EDUCATION: Education details: HEP, PT plan of care Person educated: Patient Education method: Explanation, Demonstration, Verbal cues, and Handouts Education comprehension: verbalized understanding and needs further education   HOME EXERCISE PROGRAM: Access Code: [email or web address] URL: [email or web address] Date: 09/30/2023 Prepared by: Redell Moose  Exercises - Supine Heel Slide with Strap  - 2 x daily - 6 x weekly - 1-2 sets - 10 reps - 5 hold - Supine Knee Extension Stretch on Towel Roll  - 2 x daily - 6 x weekly - 1 sets - 10 reps - 5 sec hold - seated knee flexion stretch with knee  extension strengthening  - 2 x daily - 6 x weekly - 1 sets - 15 reps - 5 sec hold - Seated Long Arc Quad (Mirrored)  - 2 x daily - 6 x weekly - 2 sets - 10 reps - Seated Straight Leg Raise with Quad Contraction  - 2 x daily - 6 x weekly - 2 sets - 10 reps - Sit to Stand with Armchair  - 2 x daily - 6 x weekly - 2 sets - 5 reps - alternating marching  - 2 x daily - 6 x weekly - 1 sets - 10 reps  ASSESSMENT:  CLINICAL IMPRESSION: Knee ROM improving from updated measurements. Worked more on gait today and will wean from RW as able. She gets good pain relief and edema reduction from vaso.   OBJECTIVE IMPAIRMENTS: decreased activity tolerance, difficulty walking, decreased balance, decreased endurance, decreased functional mobility, decreased ROM, decreased strength, impaired flexibility, impaired LE use, and pain.  ACTIVITY LIMITATIONS: bending, lifting, carry, locomotion, cleaning, community activity, driving, and or occupation  PERSONAL FACTORS: see above PMH are also affecting patient's functional outcome.  REHAB POTENTIAL: Excellent  CLINICAL DECISION MAKING: Stable/uncomplicated  EVALUATION COMPLEXITY: Low    GOALS: Short  term PT Goals Target date: 10/28/2023  Pt will be I and compliant with HEP. Baseline:  Goal status: New Pt will decrease pain by 25% overall Baseline: Goal status: New  Long term PT goals Target date:11/25/2023   Pt will improve left ROM 0-120 deg to improve functional mobility Baseline: Goal status: New Pt will improve  hip/knee strength to at least 5-/5 MMT to improve functional strength Baseline: Goal status: New Pt will improve FOTO to at least 64% functional to show improved function Baseline: Goal status: New Pt will reduce pain to overall less than 2-3/10 with usual activity and work activity. Baseline: Goal status: New Pt will be able to ambulate community distances at least 1000 ft WNL gait pattern without complaints Baseline: Goal status: New  PLAN: PT FREQUENCY: 1-3 times per week   PT DURATION: 6-8 weeks  PLANNED INTERVENTIONS (unless contraindicated): aquatic PT, Canalith repositioning, cryotherapy, Electrical stimulation, Iontophoresis with 4 mg/ml dexamethasome, Moist heat, traction, Ultrasound, gait training, Therapeutic exercise, balance training, neuromuscular re-education, patient/family education,manual techniques, passive ROM, dry needling, taping, vasopnuematic device, vestibular, spinal manipulations, joint manipulations 97110-Therapeutic exercises, 97530- Therapeutic activity, 97112- Neuromuscular re-education, 97535- Self Care, and 02859- Manual therapy  PLAN FOR NEXT SESSION: knee ROM,strengthening and gait progressions as tolerated Vaso to end  Redell Moose, PT, DPT 10/03/23 12:26 PM

## 2023-10-03 NOTE — Telephone Encounter (Signed)
 Pt is doing PT this week and Arlys John would like her taking her Pain Meds Oxycodone (percocet) for an additional 2 weeks while doing PT please advise Sent to [facility] pharmacy

## 2023-10-04 ENCOUNTER — Telehealth: Payer: Self-pay | Admitting: Physician Assistant

## 2023-10-04 ENCOUNTER — Other Ambulatory Visit: Payer: Self-pay | Admitting: Physician Assistant

## 2023-10-04 ENCOUNTER — Other Ambulatory Visit (HOSPITAL_COMMUNITY): Payer: Self-pay

## 2023-10-04 ENCOUNTER — Other Ambulatory Visit: Payer: Self-pay

## 2023-10-04 MED ORDER — HYDROCODONE-ACETAMINOPHEN 5-325 MG PO TABS
1.0000 | ORAL_TABLET | Freq: Two times a day (BID) | ORAL | 0 refills | Status: DC | PRN
  Filled 2023-10-04: qty 30, 8d supply, fill #0

## 2023-10-04 MED ORDER — OXYCODONE-ACETAMINOPHEN 5-325 MG PO TABS
1.0000 | ORAL_TABLET | Freq: Every day | ORAL | 0 refills | Status: DC | PRN
  Filled 2023-10-04: qty 30, 15d supply, fill #0

## 2023-10-04 NOTE — Telephone Encounter (Signed)
 Notified patient. She said that the hydrocodone does not help.

## 2023-10-04 NOTE — Telephone Encounter (Signed)
 Sent in one last rx for oxy.  Make sure she pays attentions to the frequency as this has changed

## 2023-10-04 NOTE — Telephone Encounter (Signed)
 Notified patient.

## 2023-10-04 NOTE — Telephone Encounter (Signed)
 She is too far out from surgery to refill oxy.  I have sent in a refill of norco

## 2023-10-04 NOTE — Telephone Encounter (Signed)
 Patient called and said that she is ok with what you give her. WU#[phone]

## 2023-10-05 ENCOUNTER — Other Ambulatory Visit (HOSPITAL_COMMUNITY): Payer: Self-pay

## 2023-10-06 DIAGNOSIS — G4733 Obstructive sleep apnea (adult) (pediatric): Secondary | ICD-10-CM | POA: Diagnosis not present

## 2023-10-07 ENCOUNTER — Ambulatory Visit: Payer: Commercial Managed Care - PPO | Admitting: Physical Therapy

## 2023-10-07 ENCOUNTER — Other Ambulatory Visit: Payer: Self-pay

## 2023-10-07 ENCOUNTER — Encounter: Payer: Self-pay | Admitting: Physical Therapy

## 2023-10-07 DIAGNOSIS — M6281 Muscle weakness (generalized): Secondary | ICD-10-CM

## 2023-10-07 DIAGNOSIS — M25662 Stiffness of left knee, not elsewhere classified: Secondary | ICD-10-CM | POA: Diagnosis not present

## 2023-10-07 DIAGNOSIS — M25562 Pain in left knee: Secondary | ICD-10-CM | POA: Diagnosis not present

## 2023-10-07 NOTE — Therapy (Signed)
 OUTPATIENT PHYSICAL THERAPY TREATMENT   Patient Name: Julie Hansen MRN: [id number] DOB:May 08, 1971, 53 y.o., female Today's Date: 10/07/2023  END OF SESSION:  PT End of Session - 10/07/23 1115     Visit Number 4    Number of Visits 16    Date for PT Re-Evaluation 11/25/23    Authorization Type UMR    PT Start Time 1107    PT Stop Time 1159    PT Time Calculation (min) 52 min    Activity Tolerance Patient tolerated treatment well    Behavior During Therapy WFL for tasks assessed/performed             Past Medical History:  Diagnosis Date   Acid reflux    Anxiety    Asthma    Diabetes mellitus without complication (HCC)    Headache    migraines   Hyperlipidemia    Hypertension    Osteoarthritis of left knee    Palpitations    PCOS (polycystic ovarian syndrome)    PONV (postoperative nausea and vomiting)    Sleep apnea    uses cpap   Past Surgical History:  Procedure Laterality Date   BREAST BIOPSY Left 04/09/2023   BREAST BIOPSY Left 04/09/2023   US  LT BREAST BX W LOC DEV 1ST LESION IMG BX SPEC US  GUIDE 04/09/2023 [facility]   BREAST BIOPSY Left 04/17/2023   MM LT BREAST BX W LOC DEV 1ST LESION IMAGE BX SPEC STEREO GUIDE 04/17/2023 [facility]   BREAST EXCISIONAL BIOPSY Right 06/28/2020   papilloma   DILATION AND EVACUATION N/A 06/05/2017   Procedure: DILATATION AND EVACUATION;  Surgeon: Bettina Muskrat, MD;  Location: [facility];  Service: Gynecology;  Laterality: N/A;   KNEE ARTHROSCOPY WITH MENISCAL REPAIR Left 09/20/2021   Procedure: LEFT KNEE PARTIAL MEDIAL MENISCECTOMY;  Surgeon: Jerri Kay HERO, MD;  Location: [facility];  Service: Orthopedics;  Laterality: Left;   RADIOACTIVE SEED GUIDED EXCISIONAL BREAST BIOPSY Right 06/28/2020   Procedure: RIGHT RADIOACTIVE SEED GUIDED EXCISIONAL BREAST BIOPSY;  Surgeon: Ebbie Cough, MD;  Location: [facility];  Service: General;  Laterality: Right;   TOTAL KNEE  ARTHROPLASTY Left 09/04/2023   Procedure: LEFT TOTAL KNEE ARTHROPLASTY;  Surgeon: Jerri Kay HERO, MD;  Location: [facility];  Service: Orthopedics;  Laterality: Left;   WISDOM TOOTH EXTRACTION     Patient Active Problem List   Diagnosis Date Noted   Status post total left knee replacement 09/04/2023   Chronic migraine without aura without status migrainosus, not intractable 04/14/2021   Muscle spasm 06/20/2018   Unspecified dyspareunia (CODE) 03/20/2017   Amenorrhea 03/20/2017   Irritable bowel syndrome 01/01/2017   Status migrainosus 01/01/2017   Anxiety 01/01/2017   Vaginal discharge 12/17/2016   GERD (gastroesophageal reflux disease) 10/30/2016   Mixed hyperlipidemia 10/30/2016   Diabetes type 2, controlled (HCC) 09/12/2015   OSA (obstructive sleep apnea) 02/16/2015   Asthma 10/29/2011    PCP: Sophronia Ozell BROCKS, MD  REFERRING PROVIDER: Jerri Kay HERO, MD  REFERRING DIAG: [phone] (ICD-10-CM) - Status post total left knee replacement  Rationale for Evaluation and Treatment: Rehabilitation  THERAPY DIAG:  Acute pain of left knee  Stiffness of left knee, not elsewhere classified  Muscle weakness (generalized)  ONSET DATE: DOS: 09/04/23   SUBJECTIVE:  SUBJECTIVE STATEMENT: Knee has been doing okay but woke up with more pain today. She has been doing her HEP.  PERTINENT HISTORY:  DM, asthma, GERD, HLD, OSA, anxiety, migraines.  PAIN:  NPRS scale: 6/10 upon arrival Pain location:all over but worse in anterior medial knee Pain description: constant, achy, sharp Aggravating factors: sitting too long, car rides Relieving factors: ice,  meds   PRECAUTIONS:  None  RED FLAGS: None   WEIGHT BEARING RESTRICTIONS:  No  FALLS:  Has patient fallen in last 6 months? Yes. Number of falls  2.Feel back in bathrooom and fell back on toilet  OCCUPATION:  Nurse  PLOF:  Independent  PATIENT GOALS:  Reduce pain, get back to normal function, get rid of walker   OBJECTIVE:   DIAGNOSTIC FINDINGS:  09/04/23 XR IMPRESSION: Left knee arthroplasty without immediate postoperative complication.  PATIENT SURVEYS:  09/30/23 FOTO 40% functional, goal is 64%  COGNITIVE STATUS: Within functional limits for tasks assessed   SENSATION: WFL  GAIT: 09/30/23 Ambulates in with RW independently, does not use AD PLOF for community ambulation   EDEMA: 09/30/23  Knee joint line: Moderate edema noted in left knee   LOWER EXTREMITY ROM:     ROM Left eval Left 10/02/22  Knee flexion A: 90 P: 99 P:105  Knee extension A: 6 P: 4 A:3   (Blank rows = not tested)   LOWER EXTREMITY MMT:    MMT Right eval Left eval  Knee flexion 5 4  Knee extension 5 4   (Blank rows = not tested)    TODAY'S TREATMENT:  10/07/23 Sci fit bike 8 min full revolutions Leg press 62# DL [id number], then left leg only 25# X10 stretching in max flexion Walking in bars 2 round trips forward, and 2 more Seated LAQ 3# 2X10 Slantboard stretch 30 sec X 3 Step ups 4 inch step leading with left X 10 reps, 2 UE support  Manual therapy for Left knee PROM with overpressure to tolerance  -Vasopnuematic device X 15 min, medium compression, 34 deg to Rt knee  10/03/23 Sci fit bike rocking first 2 min then full revolutions for 6 more minutes (8 min total) Leg press 62# DL [id number], then left leg only 25# X10 stretching in max flexion Walking in bars 2 round trips forward, and 2 more Supine Heelslides AAROM 5 sec X 10 Slantboard stretch 30 sec X 3 Step ups 4 inch step leading with left X 10 reps, 2 UE support  Manual therapy for Left knee PROM with overpressure to tolerance  -Vasopnuematic device X 10 min, medium compression, 34 deg to Rt knee       PATIENT EDUCATION: Education details: HEP, PT plan of  care Person educated: Patient Education method: Explanation, Demonstration, Verbal cues, and Handouts Education comprehension: verbalized understanding and needs further education   HOME EXERCISE PROGRAM: Access Code: [email or web address] URL: [email or web address] Date: 09/30/2023 Prepared by: Redell Moose  Exercises - Supine Heel Slide with Strap  - 2 x daily - 6 x weekly - 1-2 sets - 10 reps - 5 hold - Supine Knee Extension Stretch on Towel Roll  - 2 x daily - 6 x weekly - 1 sets - 10 reps - 5 sec hold - seated knee flexion stretch with knee extension strengthening  - 2 x daily - 6 x weekly - 1 sets - 15 reps - 5 sec hold - Seated Long Arc Quad (Mirrored)  - 2 x daily - 6 x weekly - 2 sets -  10 reps - Seated Straight Leg Raise with Quad Contraction  - 2 x daily - 6 x weekly - 2 sets - 10 reps - Sit to Stand with Armchair  - 2 x daily - 6 x weekly - 2 sets - 5 reps - alternating marching  - 2 x daily - 6 x weekly - 1 sets - 10 reps  ASSESSMENT:  CLINICAL IMPRESSION: Despite waking up with more knee pain today she had good tolerance to PT session focusing on knee ROM and strength as tolerated.    OBJECTIVE IMPAIRMENTS: decreased activity tolerance, difficulty walking, decreased balance, decreased endurance, decreased functional mobility, decreased ROM, decreased strength, impaired flexibility, impaired LE use, and pain.  ACTIVITY LIMITATIONS: bending, lifting, carry, locomotion, cleaning, community activity, driving, and or occupation  PERSONAL FACTORS: see above PMH are also affecting patient's functional outcome.  REHAB POTENTIAL: Excellent  CLINICAL DECISION MAKING: Stable/uncomplicated  EVALUATION COMPLEXITY: Low    GOALS: Short term PT Goals Target date: 10/28/2023  Pt will be I and compliant with HEP. Baseline:  Goal status: MET 10/07/23 Pt will decrease pain by 25% overall Baseline: Goal status: ongoing 10/07/23  Long term PT goals Target date:11/25/2023   Pt  will improve left ROM 0-120 deg to improve functional mobility Baseline: Goal status: ongoing 10/07/23 Pt will improve  hip/knee strength to at least 5-/5 MMT to improve functional strength Baseline: Goal status: ongoing 10/07/23 Pt will improve FOTO to at least 64% functional to show improved function Baseline: Goal status: ongoing 10/07/23 Pt will reduce pain to overall less than 2-3/10 with usual activity and work activity. Baseline: Goal status: ongoing 10/07/23 Pt will be able to ambulate community distances at least 1000 ft WNL gait pattern without complaints Baseline: Goal status: ongoing 10/07/23  PLAN: PT FREQUENCY: 1-3 times per week   PT DURATION: 6-8 weeks  PLANNED INTERVENTIONS (unless contraindicated): aquatic PT, Canalith repositioning, cryotherapy, Electrical stimulation, Iontophoresis with 4 mg/ml dexamethasome, Moist heat, traction, Ultrasound, gait training, Therapeutic exercise, balance training, neuromuscular re-education, patient/family education,manual techniques, passive ROM, dry needling, taping, vasopnuematic device, vestibular, spinal manipulations, joint manipulations 97110-Therapeutic exercises, 97530- Therapeutic activity, 97112- Neuromuscular re-education, 97535- Self Care, and [id number]- Manual therapy  PLAN FOR NEXT SESSION: knee ROM,strengthening and gait progressions as tolerated Vaso to end  Redell Moose, PT, DPT 10/07/23 11:45 AM

## 2023-10-08 ENCOUNTER — Encounter: Payer: Self-pay | Admitting: Physical Therapy

## 2023-10-08 ENCOUNTER — Ambulatory Visit: Payer: Commercial Managed Care - PPO | Admitting: Physical Therapy

## 2023-10-08 DIAGNOSIS — M6281 Muscle weakness (generalized): Secondary | ICD-10-CM

## 2023-10-08 DIAGNOSIS — R600 Localized edema: Secondary | ICD-10-CM

## 2023-10-08 DIAGNOSIS — M25562 Pain in left knee: Secondary | ICD-10-CM | POA: Diagnosis not present

## 2023-10-08 DIAGNOSIS — M25662 Stiffness of left knee, not elsewhere classified: Secondary | ICD-10-CM | POA: Diagnosis not present

## 2023-10-08 DIAGNOSIS — G8929 Other chronic pain: Secondary | ICD-10-CM

## 2023-10-08 NOTE — Therapy (Signed)
 OUTPATIENT PHYSICAL THERAPY TREATMENT   Patient Name: Julie Hansen MRN: [id number] DOB:09/20/1971, 53 y.o., female Today's Date: 10/08/2023  END OF SESSION:  PT End of Session - 10/08/23 1030     Visit Number 5    Number of Visits 16    Date for PT Re-Evaluation 11/25/23    Authorization Type UMR    PT Start Time 1023    PT Stop Time 1111    PT Time Calculation (min) 48 min    Activity Tolerance Patient tolerated treatment well    Behavior During Therapy WFL for tasks assessed/performed             Past Medical History:  Diagnosis Date   Acid reflux    Anxiety    Asthma    Diabetes mellitus without complication (HCC)    Headache    migraines   Hyperlipidemia    Hypertension    Osteoarthritis of left knee    Palpitations    PCOS (polycystic ovarian syndrome)    PONV (postoperative nausea and vomiting)    Sleep apnea    uses cpap   Past Surgical History:  Procedure Laterality Date   BREAST BIOPSY Left 04/09/2023   BREAST BIOPSY Left 04/09/2023   US  LT BREAST BX W LOC DEV 1ST LESION IMG BX SPEC US  GUIDE 04/09/2023 [facility]   BREAST BIOPSY Left 04/17/2023   MM LT BREAST BX W LOC DEV 1ST LESION IMAGE BX SPEC STEREO GUIDE 04/17/2023 [facility]   BREAST EXCISIONAL BIOPSY Right 06/28/2020   papilloma   DILATION AND EVACUATION N/A 06/05/2017   Procedure: DILATATION AND EVACUATION;  Surgeon: Bettina Muskrat, MD;  Location: [facility];  Service: Gynecology;  Laterality: N/A;   KNEE ARTHROSCOPY WITH MENISCAL REPAIR Left 09/20/2021   Procedure: LEFT KNEE PARTIAL MEDIAL MENISCECTOMY;  Surgeon: Jerri Kay HERO, MD;  Location: [facility];  Service: Orthopedics;  Laterality: Left;   RADIOACTIVE SEED GUIDED EXCISIONAL BREAST BIOPSY Right 06/28/2020   Procedure: RIGHT RADIOACTIVE SEED GUIDED EXCISIONAL BREAST BIOPSY;  Surgeon: Ebbie Cough, MD;  [facility];  Service: General;  Laterality: Right;   TOTAL KNEE  ARTHROPLASTY Left 09/04/2023   Procedure: LEFT TOTAL KNEE ARTHROPLASTY;  Surgeon: Jerri Kay HERO, MD;  Location: [facility];  Service: Orthopedics;  Laterality: Left;   WISDOM TOOTH EXTRACTION     Patient Active Problem List   Diagnosis Date Noted   Status post total left knee replacement 09/04/2023   Chronic migraine without aura without status migrainosus, not intractable 04/14/2021   Muscle spasm 06/20/2018   Unspecified dyspareunia (CODE) 03/20/2017   Amenorrhea 03/20/2017   Irritable bowel syndrome 01/01/2017   Status migrainosus 01/01/2017   Anxiety 01/01/2017   Vaginal discharge 12/17/2016   GERD (gastroesophageal reflux disease) 10/30/2016   Mixed hyperlipidemia 10/30/2016   Diabetes type 2, controlled (HCC) 09/12/2015   OSA (obstructive sleep apnea) 02/16/2015   Asthma 10/29/2011    PCP: Sophronia Ozell BROCKS, MD  REFERRING PROVIDER: Jerri Kay HERO, MD  REFERRING DIAG: [phone] (ICD-10-CM) - Status post total left knee replacement  Rationale for Evaluation and Treatment: Rehabilitation  THERAPY DIAG:  Acute pain of left knee  Stiffness of left knee, not elsewhere classified  Muscle weakness (generalized)  Localized edema  Chronic pain of left knee  ONSET DATE: DOS: 09/04/23   SUBJECTIVE:  SUBJECTIVE STATEMENT: Slipped on the ice but did not fall, some pain in the back of her leg from this but not bad  PERTINENT HISTORY:  DM, asthma, GERD, HLD, OSA, anxiety, migraines.  PAIN:  NPRS scale: 5/10 upon arrival Pain location:all over but worse in anterior medial knee Pain description: constant, achy, sharp Aggravating factors: sitting too long, car rides Relieving factors: ice,  meds   PRECAUTIONS:  None  RED FLAGS: None   WEIGHT BEARING RESTRICTIONS:  No  FALLS:  Has  patient fallen in last 6 months? Yes. Number of falls 2.Feel back in bathrooom and fell back on toilet  OCCUPATION:  Nurse  PLOF:  Independent  PATIENT GOALS:  Reduce pain, get back to normal function, get rid of walker   OBJECTIVE:   DIAGNOSTIC FINDINGS:  09/04/23 XR IMPRESSION: Left knee arthroplasty without immediate postoperative complication.  PATIENT SURVEYS:  09/30/23 FOTO 40% functional, goal is 64%  COGNITIVE STATUS: Within functional limits for tasks assessed   SENSATION: WFL  GAIT: 09/30/23 Ambulates in with RW independently, does not use AD PLOF for community ambulation   EDEMA: 09/30/23  Knee joint line: Moderate edema noted in left knee   LOWER EXTREMITY ROM:     ROM Left eval Left 10/02/22  Knee flexion A: 90 P: 99 P:105  Knee extension A: 6 P: 4 A:3   (Blank rows = not tested)   LOWER EXTREMITY MMT:    MMT Right eval Left eval  Knee flexion 5 4  Knee extension 5 4   (Blank rows = not tested)    TODAY'S TREATMENT:  10/08/23 Sci fit bike 8 min full revolutions Leg press 62# DL [id number], then left leg only 25# X10 stretching in max flexion Walking in bars 2 round trips forward, and 2 more Seated LAQ 3# 2X10 Slantboard stretch 30 sec X 3 Seated hamstring stretch 30 sec X 2 Step ups 4 inch step leading with left X 10 reps, 2 UE support  Manual therapy for Left knee PROM with overpressure to tolerance  -Vasopnuematic device X 15 min, medium compression, 34 deg to Rt knee  10/07/23 Sci fit bike 8 min full revolutions Leg press 62# DL [id number], then left leg only 25# X10 stretching in max flexion Walking in bars 2 round trips forward, and 2 more Seated LAQ 3# 2X10 Slantboard stretch 30 sec X 3 Step ups 4 inch step leading with left X 10 reps, 2 UE support  Manual therapy for Left knee PROM with overpressure to tolerance  -Vasopnuematic device X 15 min, medium compression, 34 deg to Rt knee  10/03/23 Sci fit bike rocking first 2 min then  full revolutions for 6 more minutes (8 min total) Leg press 62# DL [id number], then left leg only 25# X10 stretching in max flexion Walking in bars 2 round trips forward, and 2 more Supine Heelslides AAROM 5 sec X 10 Slantboard stretch 30 sec X 3 Step ups 4 inch step leading with left X 10 reps, 2 UE support  Manual therapy for Left knee PROM with overpressure to tolerance  -Vasopnuematic device X 10 min, medium compression, 34 deg to Rt knee       PATIENT EDUCATION: Education details: HEP, PT plan of care Person educated: Patient Education method: Explanation, Demonstration, Verbal cues, and Handouts Education comprehension: verbalized understanding and needs further education   HOME EXERCISE PROGRAM: Access Code: [email or web address] URL: [email or web address] Date: 09/30/2023 Prepared by: Redell Moose  Exercises - Supine Heel Slide with Strap  -  2 x daily - 6 x weekly - 1-2 sets - 10 reps - 5 hold - Supine Knee Extension Stretch on Towel Roll  - 2 x daily - 6 x weekly - 1 sets - 10 reps - 5 sec hold - seated knee flexion stretch with knee extension strengthening  - 2 x daily - 6 x weekly - 1 sets - 15 reps - 5 sec hold - Seated Long Arc Quad (Mirrored)  - 2 x daily - 6 x weekly - 2 sets - 10 reps - Seated Straight Leg Raise with Quad Contraction  - 2 x daily - 6 x weekly - 2 sets - 10 reps - Sit to Stand with Armchair  - 2 x daily - 6 x weekly - 2 sets - 5 reps - alternating marching  - 2 x daily - 6 x weekly - 1 sets - 10 reps  ASSESSMENT:  CLINICAL IMPRESSION: She had slipped on some ice today but did not fall and no apparent injury noted.  She had good tolerance to PT session focusing on knee ROM and strength as tolerated and overall her gait is improving.    OBJECTIVE IMPAIRMENTS: decreased activity tolerance, difficulty walking, decreased balance, decreased endurance, decreased functional mobility, decreased ROM, decreased strength, impaired flexibility, impaired LE use,  and pain.  ACTIVITY LIMITATIONS: bending, lifting, carry, locomotion, cleaning, community activity, driving, and or occupation  PERSONAL FACTORS: see above PMH are also affecting patient's functional outcome.  REHAB POTENTIAL: Excellent  CLINICAL DECISION MAKING: Stable/uncomplicated  EVALUATION COMPLEXITY: Low    GOALS: Short term PT Goals Target date: 10/28/2023  Pt will be I and compliant with HEP. Baseline:  Goal status: MET 10/07/23 Pt will decrease pain by 25% overall Baseline: Goal status: ongoing 10/07/23  Long term PT goals Target date:11/25/2023   Pt will improve left ROM 0-120 deg to improve functional mobility Baseline: Goal status: ongoing 10/07/23 Pt will improve  hip/knee strength to at least 5-/5 MMT to improve functional strength Baseline: Goal status: ongoing 10/07/23 Pt will improve FOTO to at least 64% functional to show improved function Baseline: Goal status: ongoing 10/07/23 Pt will reduce pain to overall less than 2-3/10 with usual activity and work activity. Baseline: Goal status: ongoing 10/07/23 Pt will be able to ambulate community distances at least 1000 ft WNL gait pattern without complaints Baseline: Goal status: ongoing 10/07/23  PLAN: PT FREQUENCY: 1-3 times per week   PT DURATION: 6-8 weeks  PLANNED INTERVENTIONS (unless contraindicated): aquatic PT, Canalith repositioning, cryotherapy, Electrical stimulation, Iontophoresis with 4 mg/ml dexamethasome, Moist heat, traction, Ultrasound, gait training, Therapeutic exercise, balance training, neuromuscular re-education, patient/family education,manual techniques, passive ROM, dry needling, taping, vasopnuematic device, vestibular, spinal manipulations, joint manipulations 97110-Therapeutic exercises, 97530- Therapeutic activity, 97112- Neuromuscular re-education, 97535- Self Care, and [id number]- Manual therapy  PLAN FOR NEXT SESSION: knee ROM,strengthening and gait progressions weaning off RW as  tolerated Vaso to end  Redell Moose, PT, DPT 10/08/23 11:06 AM

## 2023-10-09 ENCOUNTER — Encounter: Payer: Commercial Managed Care - PPO | Admitting: Physical Therapy

## 2023-10-09 ENCOUNTER — Other Ambulatory Visit (HOSPITAL_COMMUNITY): Payer: Self-pay

## 2023-10-09 ENCOUNTER — Ambulatory Visit: Payer: Commercial Managed Care - PPO | Admitting: Rehabilitative and Restorative Service Providers"

## 2023-10-09 ENCOUNTER — Encounter: Payer: Self-pay | Admitting: Rehabilitative and Restorative Service Providers"

## 2023-10-09 DIAGNOSIS — M25562 Pain in left knee: Secondary | ICD-10-CM

## 2023-10-09 DIAGNOSIS — M25662 Stiffness of left knee, not elsewhere classified: Secondary | ICD-10-CM | POA: Diagnosis not present

## 2023-10-09 DIAGNOSIS — M6281 Muscle weakness (generalized): Secondary | ICD-10-CM

## 2023-10-09 DIAGNOSIS — R600 Localized edema: Secondary | ICD-10-CM

## 2023-10-09 NOTE — Therapy (Signed)
 OUTPATIENT PHYSICAL THERAPY TREATMENT   Patient Name: Julie Hansen MRN: [id number] DOB:[birth year], 53 y.o., female Today's Date: 10/09/2023  END OF SESSION:  PT End of Session - 10/09/23 1647     Visit Number 6    Number of Visits 16    Date for PT Re-Evaluation 11/25/23    Authorization Type U[employer]    PT Start Time 1600    PT Stop Time 1650    PT Time Calculation (min) 50 min    Activity Tolerance Patient tolerated treatment well;No increased pain;Patient limited by pain    Behavior During Therapy [facility] for tasks assessed/performed              Past Medical History:  Diagnosis Date   Acid reflux    Anxiety    Asthma    Diabetes mellitus without complication (HCC)    Headache    migraines   Hyperlipidemia    Hypertension    Osteoarthritis of left knee    Palpitations    PCOS (polycystic ovarian syndrome)    PONV (postoperative nausea and vomiting)    Sleep apnea    uses cpap   Past Surgical History:  Procedure Laterality Date   BREAST BIOPSY Left 04/09/2023   BREAST BIOPSY Left 04/09/2023   US  LT BREAST BX W LOC DEV 1ST LESION IMG BX SPEC US  GUIDE 04/09/2023 [facility]   BREAST BIOPSY Left 04/17/2023   MM LT BREAST BX W LOC DEV 1ST LESION IMAGE BX SPEC STEREO GUIDE 04/17/2023 [facility]   BREAST EXCISIONAL BIOPSY Right 06/28/2020   papilloma   DILATION AND EVACUATION N/A 06/05/2017   Procedure: DILATATION AND EVACUATION;  Surgeon: Artemisa Bile, MD;  Location: [facility];  Service: Gynecology;  Laterality: N/A;   KNEE ARTHROSCOPY WITH MENISCAL REPAIR Left 09/20/2021   Procedure: LEFT KNEE PARTIAL MEDIAL MENISCECTOMY;  Surgeon: Wes Hamman, MD;  Location: [facility];  Service: Orthopedics;  Laterality: Left;   RADIOACTIVE SEED GUIDED EXCISIONAL BREAST BIOPSY Right 06/28/2020   Procedure: RIGHT RADIOACTIVE SEED GUIDED EXCISIONAL BREAST BIOPSY;  Surgeon: Enid Harry, MD;  Location:  S[facility]ERY [facility];  Service: General;   Laterality: Right;   TOTAL KNEE ARTHROPLASTY Left 09/04/2023   Procedure: LEFT TOTAL KNEE ARTHROPLASTY;  Surgeon: Wes Hamman, MD;  Location: [facility];  Service: Orthopedics;  Laterality: Left;   WISDOM TOOTH EXTRACTION     Patient Active Problem List   Diagnosis Date Noted   Status post total left knee replacement 09/04/2023   Chronic migraine without aura without status migrainosus, not intractable 04/14/2021   Muscle spasm 06/20/2018   Unspecified dyspareunia (CODE) 03/20/2017   Amenorrhea 03/20/2017   Irritable bowel syndrome 01/01/2017   Status migrainosus 01/01/2017   Anxiety 01/01/2017   Vaginal discharge 12/17/2016   GERD (gastroesophageal reflux disease) 10/30/2016   Mixed hyperlipidemia 10/30/2016   Diabetes type 2, controlled (HCC) 09/12/2015   OSA (obstructive sleep apnea) 02/16/2015   Asthma 10/29/2011    PCP: Emaline Handsome, MD  REFERRING PROVIDER: Wes Hamman, MD  REFERRING DIAG: [phone] (ICD-10-CM) - Status post total left knee replacement  Rationale for Evaluation and Treatment: Rehabilitation  THERAPY DIAG:  Acute pain of left knee  Stiffness of left knee, not elsewhere classified  Muscle weakness (generalized)  Localized edema  ONSET DATE: DOS: 09/04/23   SUBJECTIVE:  SUBJECTIVE STATEMENT: Chavy reports good early HEP compliance.  PERTINENT HISTORY:  DM, asthma, GERD, HLD, OSA, anxiety, migraines.  PAIN:  NPRS scale: 1-6/10 this week Pain location:all over but worse in anterior medial knee Pain description: constant, achy, sharp Aggravating factors: sitting too long, car rides Relieving factors: ice,  meds   PRECAUTIONS:  None  RED FLAGS: None   WEIGHT BEARING RESTRICTIONS:  No  FALLS:  Has patient fallen in last 6 months? Yes. Number of  falls 2.Feel back in bathrooom and fell back on toilet  OCCUPATION:  Nurse  PLOF:  Independent  PATIENT GOALS:  Reduce pain, get back to normal function, get rid of walker   OBJECTIVE:   DIAGNOSTIC FINDINGS:  09/04/23 XR IMPRESSION: Left knee arthroplasty without immediate postoperative complication.  PATIENT SURVEYS:  09/30/23 FOTO 40% functional, goal is 64%  COGNITIVE STATUS: Within functional limits for tasks assessed   SENSATION: WFL  GAIT: 09/30/23 Ambulates in with RW independently, does not use AD PLOF for community ambulation   EDEMA: 09/30/23  Knee joint line: Moderate edema noted in left knee   LOWER EXTREMITY ROM:     ROM Left eval Left 10/02/22 Left 10/09/2023  Knee flexion A: 90 P: 99 P:105 106  Knee extension A: 6 P: 4 A:3 -2   (Blank rows = not tested)   LOWER EXTREMITY MMT:    MMT Right eval Left eval  Knee flexion 5 4  Knee extension 5 4   (Blank rows = not tested)    TODAY'S TREATMENT:  10/09/2023 Seated straight leg raises 3 sets of 5 Supine quadriceps sets with left heel prop 10 x 5 seconds Seated knee flexion AAROM 10 x 10 seconds  Functional Activities (sit to stand, stairs, curbs): Double Leg Press 68# 15 x full extension (lock left knee out at the top) and stretch into flexion Single Leg Press 37# 10 x full extension (lock left knee out at the top) and stretch into flexion  Neuromuscular re-education: Tandem balance: eyes open; head turning; eyes closed 2 x 20 seconds each  Vasopneumatic medium pressure 34 degrees left knee 10 minutes   10/08/23 Sci fit bike 8 min full revolutions Leg press 62# DL [id number], then left leg only 25# X10 stretching in max flexion Walking in bars 2 round trips forward, and 2 more Seated LAQ 3# 2X10 Slantboard stretch 30 sec X 3 Seated hamstring stretch 30 sec X 2 Step ups 4 inch step leading with left X 10 reps, 2 UE support  Manual therapy for Left knee PROM with overpressure to  tolerance  -Vasopnuematic device X 15 min, medium compression, 34 deg to Rt knee   10/07/23 Sci fit bike 8 min full revolutions Leg press 62# DL [id number], then left leg only 25# X10 stretching in max flexion Walking in bars 2 round trips forward, and 2 more Seated LAQ 3# 2X10 Slantboard stretch 30 sec X 3 Step ups 4 inch step leading with left X 10 reps, 2 UE support  Manual therapy for Left knee PROM with overpressure to tolerance  -Vasopnuematic device X 15 min, medium compression, 34 deg to Rt knee   PATIENT EDUCATION: Education details: HEP, PT plan of care Person educated: Patient Education method: Explanation, Demonstration, Verbal cues, and Handouts Education comprehension: verbalized understanding and needs further education   HOME EXERCISE PROGRAM: Access Code: [email or web address] URL: [email or web address] Date: 10/09/2023 Prepared by: Terral Ferrari  Exercises - Supine Heel Slide with Strap  - 2 x daily - 6  x weekly - 1-2 sets - 10 reps - 5 hold - Supine Knee Extension Stretch on Towel Roll  - 2 x daily - 6 x weekly - 1 sets - 10 reps - 5 sec hold - seated knee flexion stretch with knee extension strengthening  - 2 x daily - 6 x weekly - 1 sets - 15 reps - 5 sec hold - Seated Long Arc Quad (Mirrored)  - 2 x daily - 6 x weekly - 2 sets - 10 reps - Seated Straight Leg Raise with Quad Contraction  - 2 x daily - 6 x weekly - 2 sets - 10 reps - Sit to Stand with Armchair  - 2 x daily - 6 x weekly - 2 sets - 5 reps - alternating marching  - 2 x daily - 6 x weekly - 1 sets - 10 reps - Supine Quadricep Sets  - 5 x daily - 7 x weekly - 2 sets - 10 reps - 5 second hold - Small Range Straight Leg Raise  - 2 x daily - 7 x weekly - 3-5 sets - 5 reps - 3 seconds hold  ASSESSMENT:  CLINICAL IMPRESSION: AROM is 2 - 0 - 106 degrees today.  Guliana is doing a good job with her home exercises and her edema looks good this afternoon.  We discussed prioritizing active range of motion  (flexion and extension), quadriceps strength and edema control at this point in her rehabilitation.  She is on track to meet long-term goals established at evaluation.  OBJECTIVE IMPAIRMENTS: decreased activity tolerance, difficulty walking, decreased balance, decreased endurance, decreased functional mobility, decreased ROM, decreased strength, impaired flexibility, impaired LE use, and pain.  ACTIVITY LIMITATIONS: bending, lifting, carry, locomotion, cleaning, community activity, driving, and or occupation  PERSONAL FACTORS: see above PMH are also affecting patient's functional outcome.  REHAB POTENTIAL: Excellent  CLINICAL DECISION MAKING: Stable/uncomplicated  EVALUATION COMPLEXITY: Low    GOALS: Short term PT Goals Target date: 10/28/2023  Pt will be I and compliant with HEP. Baseline:  Goal status: MET 10/07/23 Pt will decrease pain by 25% overall Baseline: Goal status: ongoing 10/09/23  Long term PT goals Target date:11/25/2023   Pt will improve left ROM 0-120 deg to improve functional mobility Baseline: Goal status: ongoing 10/09/23 Pt will improve  hip/knee strength to at least 5-/5 MMT to improve functional strength Baseline: Goal status: ongoing 10/07/23 Pt will improve FOTO to at least 64% functional to show improved function Baseline: Goal status: ongoing 10/07/23 Pt will reduce pain to overall less than 2-3/10 with usual activity and work activity. Baseline: Goal status: ongoing 10/09/23 Pt will be able to ambulate community distances at least 1000 ft WNL gait pattern without complaints Baseline: Goal status: ongoing 10/09/23  PLAN: PT FREQUENCY: 1-3 times per week   PT DURATION: 6-8 weeks  PLANNED INTERVENTIONS (unless contraindicated): aquatic PT, Canalith repositioning, cryotherapy, Electrical stimulation, Iontophoresis with 4 mg/ml dexamethasome, Moist heat, traction, Ultrasound, gait training, Therapeutic exercise, balance training, neuromuscular  re-education, patient/family education,manual techniques, passive ROM, dry needling, taping, vasopnuematic device, vestibular, spinal manipulations, joint manipulations 97110-Therapeutic exercises, 97530- Therapeutic activity, 97112- Neuromuscular re-education, 97535- Self Care, and [id number]- Manual therapy  PLAN FOR NEXT SESSION: knee ROM, quadriceps strengthening and gait progressions weaning off RW as appropriate. Vaso to end  [facility] PT MPT 10/09/23 5:01 PM

## 2023-10-10 DIAGNOSIS — G4733 Obstructive sleep apnea (adult) (pediatric): Secondary | ICD-10-CM | POA: Diagnosis not present

## 2023-10-11 ENCOUNTER — Other Ambulatory Visit (HOSPITAL_COMMUNITY): Payer: Self-pay

## 2023-10-14 ENCOUNTER — Encounter: Payer: Self-pay | Admitting: Physical Therapy

## 2023-10-14 ENCOUNTER — Encounter: Payer: Commercial Managed Care - PPO | Admitting: Physical Therapy

## 2023-10-14 ENCOUNTER — Ambulatory Visit: Payer: Commercial Managed Care - PPO | Admitting: Physical Therapy

## 2023-10-14 ENCOUNTER — Other Ambulatory Visit (HOSPITAL_COMMUNITY): Payer: Self-pay

## 2023-10-14 DIAGNOSIS — M25562 Pain in left knee: Secondary | ICD-10-CM

## 2023-10-14 DIAGNOSIS — R600 Localized edema: Secondary | ICD-10-CM | POA: Diagnosis not present

## 2023-10-14 DIAGNOSIS — M6281 Muscle weakness (generalized): Secondary | ICD-10-CM

## 2023-10-14 DIAGNOSIS — M25662 Stiffness of left knee, not elsewhere classified: Secondary | ICD-10-CM

## 2023-10-14 NOTE — Therapy (Signed)
OUTPATIENT PHYSICAL THERAPY TREATMENT   Patient Name: Julie Hansen MRN: [id number] DOB:09/03/1971, 53 y.o., female Today's Date: 10/14/2023  END OF SESSION:     Past Medical History:  Diagnosis Date   Acid reflux    Anxiety    Asthma    Diabetes mellitus without complication (HCC)    Headache    migraines   Hyperlipidemia    Hypertension    Osteoarthritis of left knee    Palpitations    PCOS (polycystic ovarian syndrome)    PONV (postoperative nausea and vomiting)    Sleep apnea    uses cpap   Past Surgical History:  Procedure Laterality Date   BREAST BIOPSY Left 04/09/2023   BREAST BIOPSY Left 04/09/2023   [place] LT BREAST BX W LOC DEV 1ST LESION IMG BX SPEC US GUIDE 04/09/2023 [facility]   BREAST BIOPSY Left 04/17/2023   MM LT BREAST BX W LOC DEV 1ST LESION IMAGE BX SPEC STEREO GUIDE 04/17/2023 [facility]   BREAST EXCISIONAL BIOPSY Right 06/28/2020   papilloma   DILATION AND EVACUATION N/A 06/05/2017   Procedure: DILATATION AND EVACUATION;  Surgeon: Essie Hart, MD;  Location: [facility];  Service: Gynecology;  Laterality: N/A;   KNEE ARTHROSCOPY WITH MENISCAL REPAIR Left 09/20/2021   Procedure: LEFT KNEE PARTIAL MEDIAL MENISCECTOMY;  Surgeon: Tarry Kos, MD;  Location: [facility];  Service: Orthopedics;  Laterality: Left;   RADIOACTIVE SEED GUIDED EXCISIONAL BREAST BIOPSY Right 06/28/2020   Procedure: RIGHT RADIOACTIVE SEED GUIDED EXCISIONAL BREAST BIOPSY;  Surgeon: Emelia Loron, MD;  Location: [facility];  Service: General;  Laterality: Right;   TOTAL KNEE ARTHROPLASTY Left 09/04/2023   Procedure: LEFT TOTAL KNEE ARTHROPLASTY;  Surgeon: Tarry Kos, MD;  Location: [facility];  Service: Orthopedics;  Laterality: Left;   WISDOM TOOTH EXTRACTION     Patient Active Problem List   Diagnosis Date Noted   Status post total left knee replacement 09/04/2023   Chronic migraine without aura without status migrainosus, not  intractable 04/14/2021   Muscle spasm 06/20/2018   Unspecified dyspareunia (CODE) 03/20/2017   Amenorrhea 03/20/2017   Irritable bowel syndrome 01/01/2017   Status migrainosus 01/01/2017   Anxiety 01/01/2017   Vaginal discharge 12/17/2016   GERD (gastroesophageal reflux disease) 10/30/2016   Mixed hyperlipidemia 10/30/2016   Diabetes type 2, controlled (HCC) 09/12/2015   OSA (obstructive sleep apnea) 02/16/2015   Asthma 10/29/2011    PCP: Eartha Inch, MD  REFERRING PROVIDER: Tarry Kos, MD  REFERRING DIAG: [phone] (ICD-10-CM) - Status post total left knee replacement  Rationale for Evaluation and Treatment: Rehabilitation  THERAPY DIAG:  No diagnosis found.  ONSET DATE: DOS: 09/04/23   SUBJECTIVE:  SUBJECTIVE STATEMENT: [name] reports she has been doing HEP, relays less knee pain overall and has not took pain meds in 2 days  PERTINENT HISTORY:  DM, asthma, GERD, HLD, OSA, anxiety, migraines.  PAIN:  NPRS scale: 2/10 upon arrival Pain location:all over but worse in anterior medial knee Pain description: constant, achy, sharp Aggravating factors: sitting too long, car rides Relieving factors: ice,  meds   PRECAUTIONS:  None  RED FLAGS: None   WEIGHT BEARING RESTRICTIONS:  No  FALLS:  Has patient fallen in last 6 months? Yes. Number of falls 2.Feel back in bathrooom and fell back on toilet  OCCUPATION:  Nurse  PLOF:  Independent  PATIENT GOALS:  Reduce pain, get back to normal function, get rid of walker   OBJECTIVE:   DIAGNOSTIC FINDINGS:  09/04/23 XR IMPRESSION: Left knee arthroplasty without immediate postoperative complication.  PATIENT SURVEYS:  09/30/23 FOTO 40% functional, goal is 64%  COGNITIVE STATUS: Within functional limits for tasks  assessed   SENSATION: WFL  GAIT: 09/30/23 Ambulates in with RW independently, does not use AD PLOF for community ambulation   EDEMA: 09/30/23  Knee joint line: Moderate edema noted in left knee   LOWER EXTREMITY ROM:     ROM Left eval Left 10/02/22 Left 10/09/2023  Knee flexion A: 90 P: 99 P:105 106  Knee extension A: 6 P: 4 A:3 -2   (Blank rows = not tested)   LOWER EXTREMITY MMT:    MMT Right eval Left eval  Knee flexion 5 4  Knee extension 5 4   (Blank rows = not tested)    TODAY'S TREATMENT:  10/14/2023 Therex: Recumbent bike seat #7 full circles X 10 min L2 Slantboard 30 sec X 3  Manual: left knee PROM with overpressure flexion and extension, extension mobs, manual hamstring stretching  Functional Activities (sit to stand, stairs, curbs): Double Leg Press 68# 2 X10 x full extension (lock left knee out at the top) and stretch into flexion Single Leg Press 37# 2X10 x full extension (lock left knee out at the top) and stretch into flexion Gait without UE support throughout clinic 300 feet, 75 feet, in efforts to wean off RW.   Vasopneumatic medium pressure 34 degrees left knee 10 minutes  10/09/2023 Seated straight leg raises 3 sets of 5 Supine quadriceps sets with left heel prop 10 x 5 seconds Seated knee flexion AAROM 10 x 10 seconds  Functional Activities (sit to stand, stairs, curbs): Double Leg Press 68# 15 x full extension (lock left knee out at the top) and stretch into flexion Single Leg Press 37# 10 x full extension (lock left knee out at the top) and stretch into flexion  Neuromuscular re-education: Tandem balance: eyes open; head turning; eyes closed 2 x 20 seconds each  Vasopneumatic medium pressure 34 degrees left knee 10 minutes   10/08/23 Sci fit bike 8 min full revolutions Leg press 62# DL 1O10, then left leg only 25# X10 stretching in max flexion Walking in bars 2 round trips forward, and 2 more Seated LAQ 3# 2X10 Slantboard stretch  30 sec X 3 Seated hamstring stretch 30 sec X 2 Step ups 4 inch step leading with left X 10 reps, 2 UE support  Manual therapy for Left knee PROM with overpressure to tolerance  -Vasopnuematic device X 15 min, medium compression, 34 deg to Rt knee   10/07/23 Sci fit bike 8 min full revolutions Leg press 62# DL 9U04, then left leg only 25# X10 stretching  in max flexion Walking in bars 2 round trips forward, and 2 more Seated LAQ 3# 2X10 Slantboard stretch 30 sec X 3 Step ups 4 inch step leading with left X 10 reps, 2 UE support  Manual therapy for Left knee PROM with overpressure to tolerance  -Vasopnuematic device X 15 min, medium compression, 34 deg to Rt knee   PATIENT EDUCATION: Education details: HEP, PT plan of care Person educated: Patient Education method: Explanation, Demonstration, Verbal cues, and Handouts Education comprehension: verbalized understanding and needs further education   HOME EXERCISE PROGRAM: Access Code: [email or web address] URL: [email or web address] Date: 10/09/2023 Prepared by: Pauletta Browns  Exercises - Supine Heel Slide with Strap  - 2 x daily - 6 x weekly - 1-2 sets - 10 reps - 5 hold - Supine Knee Extension Stretch on Towel Roll  - 2 x daily - 6 x weekly - 1 sets - 10 reps - 5 sec hold - seated knee flexion stretch with knee extension strengthening  - 2 x daily - 6 x weekly - 1 sets - 15 reps - 5 sec hold - Seated Long Arc Quad (Mirrored)  - 2 x daily - 6 x weekly - 2 sets - 10 reps - Seated Straight Leg Raise with Quad Contraction  - 2 x daily - 6 x weekly - 2 sets - 10 reps - Sit to Stand with Armchair  - 2 x daily - 6 x weekly - 2 sets - 5 reps - alternating marching  - 2 x daily - 6 x weekly - 1 sets - 10 reps - Supine Quadricep Sets  - 5 x daily - 7 x weekly - 2 sets - 10 reps - 5 second hold - Small Range Straight Leg Raise  - 2 x daily - 7 x weekly - 3-5 sets - 5 reps - 3 seconds hold  ASSESSMENT:  CLINICAL IMPRESSION: She  continues to improve with PT, does still prefer to use RW for ambulation and we are working to wean off of this as able. She will continue to benefit from strengthening. ROM is coming along very well.   OBJECTIVE IMPAIRMENTS: decreased activity tolerance, difficulty walking, decreased balance, decreased endurance, decreased functional mobility, decreased ROM, decreased strength, impaired flexibility, impaired LE use, and pain.  ACTIVITY LIMITATIONS: bending, lifting, carry, locomotion, cleaning, community activity, driving, and or occupation  PERSONAL FACTORS: see above PMH are also affecting patient's functional outcome.  REHAB POTENTIAL: Excellent  CLINICAL DECISION MAKING: Stable/uncomplicated  EVALUATION COMPLEXITY: Low    GOALS: Short term PT Goals Target date: 10/28/2023  Pt will be I and compliant with HEP. Baseline:  Goal status: MET 10/07/23 Pt will decrease pain by 25% overall Baseline: Goal status: ongoing 10/09/23  Long term PT goals Target date:11/25/2023   Pt will improve left ROM 0-120 deg to improve functional mobility Baseline: Goal status: ongoing 10/09/23 Pt will improve  hip/knee strength to at least 5-/5 MMT to improve functional strength Baseline: Goal status: ongoing 10/07/23 Pt will improve FOTO to at least 64% functional to show improved function Baseline: Goal status: ongoing 10/07/23 Pt will reduce pain to overall less than 2-3/10 with usual activity and work activity. Baseline: Goal status: ongoing 10/09/23 Pt will be able to ambulate community distances at least 1000 ft WNL gait pattern without complaints Baseline: Goal status: ongoing 10/09/23  PLAN: PT FREQUENCY: 1-3 times per week   PT DURATION: 6-8 weeks  PLANNED INTERVENTIONS (unless contraindicated): aquatic PT, Canalith repositioning, cryotherapy, Electrical stimulation, Iontophoresis with  4 mg/ml dexamethasome, Moist heat, traction, Ultrasound, gait training, Therapeutic exercise, balance  training, neuromuscular re-education, patient/family education,manual techniques, passive ROM, dry needling, taping, vasopnuematic device, vestibular, spinal manipulations, joint manipulations 97110-Therapeutic exercises, 97530- Therapeutic activity, 97112- Neuromuscular re-education, 97535- Self Care, and 16109- Manual therapy  PLAN FOR NEXT SESSION: knee ROM, quadriceps strengthening and gait progressions weaning off RW as appropriate. Vaso to end  Ivery Quale, PT, DPT 10/14/23 3:08 PM

## 2023-10-16 ENCOUNTER — Encounter: Payer: Commercial Managed Care - PPO | Admitting: Physical Therapy

## 2023-10-16 ENCOUNTER — Encounter: Payer: Self-pay | Admitting: Physical Therapy

## 2023-10-16 ENCOUNTER — Ambulatory Visit (INDEPENDENT_AMBULATORY_CARE_PROVIDER_SITE_OTHER): Payer: Commercial Managed Care - PPO | Admitting: Physical Therapy

## 2023-10-16 DIAGNOSIS — M25562 Pain in left knee: Secondary | ICD-10-CM | POA: Diagnosis not present

## 2023-10-16 DIAGNOSIS — G8929 Other chronic pain: Secondary | ICD-10-CM | POA: Diagnosis not present

## 2023-10-16 DIAGNOSIS — M6281 Muscle weakness (generalized): Secondary | ICD-10-CM

## 2023-10-16 DIAGNOSIS — R600 Localized edema: Secondary | ICD-10-CM

## 2023-10-16 DIAGNOSIS — M25662 Stiffness of left knee, not elsewhere classified: Secondary | ICD-10-CM | POA: Diagnosis not present

## 2023-10-16 NOTE — Therapy (Signed)
OUTPATIENT PHYSICAL THERAPY TREATMENT   Patient Name: Julie Hansen MRN: [id number] DOB:Feb 01, 1971, 53 y.o., female Today's Date: 10/16/2023  END OF SESSION:  PT End of Session - 10/16/23 1554     Visit Number 8    Number of Visits 16    Date for PT Re-Evaluation 11/25/23    Authorization Type UMR    PT Start Time 1515    PT Stop Time 1600    PT Time Calculation (min) 45 min    Activity Tolerance Patient tolerated treatment well    Behavior During Therapy WFL for tasks assessed/performed               Past Medical History:  Diagnosis Date   Acid reflux    Anxiety    Asthma    Diabetes mellitus without complication (HCC)    Headache    migraines   Hyperlipidemia    Hypertension    Osteoarthritis of left knee    Palpitations    PCOS (polycystic ovarian syndrome)    PONV (postoperative nausea and vomiting)    Sleep apnea    uses cpap   Past Surgical History:  Procedure Laterality Date   BREAST BIOPSY Left 04/09/2023   BREAST BIOPSY Left 04/09/2023   [place] LT BREAST BX W LOC DEV 1ST LESION IMG BX SPEC US GUIDE 04/09/2023 [facility]   BREAST BIOPSY Left 04/17/2023   MM LT BREAST BX W LOC DEV 1ST LESION IMAGE BX SPEC STEREO GUIDE 04/17/2023 [facility]   BREAST EXCISIONAL BIOPSY Right 06/28/2020   papilloma   DILATION AND EVACUATION N/A 06/05/2017   Procedure: DILATATION AND EVACUATION;  Surgeon: Essie Hart, MD;  Location: [facility];  Service: Gynecology;  Laterality: N/A;   KNEE ARTHROSCOPY WITH MENISCAL REPAIR Left 09/20/2021   Procedure: LEFT KNEE PARTIAL MEDIAL MENISCECTOMY;  Surgeon: Tarry Kos, MD;  Location: [facility];  Service: Orthopedics;  Laterality: Left;   RADIOACTIVE SEED GUIDED EXCISIONAL BREAST BIOPSY Right 06/28/2020   Procedure: RIGHT RADIOACTIVE SEED GUIDED EXCISIONAL BREAST BIOPSY;  Surgeon: Emelia Loron, MD;  Location: [facility];  Service: General;  Laterality: Right;   TOTAL KNEE  ARTHROPLASTY Left 09/04/2023   Procedure: LEFT TOTAL KNEE ARTHROPLASTY;  Surgeon: Tarry Kos, MD;  Location: [facility];  Service: Orthopedics;  Laterality: Left;   WISDOM TOOTH EXTRACTION     Patient Active Problem List   Diagnosis Date Noted   Status post total left knee replacement 09/04/2023   Chronic migraine without aura without status migrainosus, not intractable 04/14/2021   Muscle spasm 06/20/2018   Unspecified dyspareunia (CODE) 03/20/2017   Amenorrhea 03/20/2017   Irritable bowel syndrome 01/01/2017   Status migrainosus 01/01/2017   Anxiety 01/01/2017   Vaginal discharge 12/17/2016   GERD (gastroesophageal reflux disease) 10/30/2016   Mixed hyperlipidemia 10/30/2016   Diabetes type 2, controlled (HCC) 09/12/2015   OSA (obstructive sleep apnea) 02/16/2015   Asthma 10/29/2011    PCP: Eartha Inch, MD  REFERRING PROVIDER: Tarry Kos, MD  REFERRING DIAG: [phone] (ICD-10-CM) - Status post total left knee replacement  Rationale for Evaluation and Treatment: Rehabilitation  THERAPY DIAG:  Acute pain of left knee  Stiffness of left knee, not elsewhere classified  Muscle weakness (generalized)  Localized edema  Chronic pain of left knee  ONSET DATE: DOS: 09/04/23   SUBJECTIVE:  SUBJECTIVE STATEMENT: [place] reports not really having some pain just some stiffness  PERTINENT HISTORY:  DM, asthma, GERD, HLD, OSA, anxiety, migraines.  PAIN:  NPRS scale: 2/10 upon arrival Pain location:all over but worse in anterior medial knee Pain description: constant, achy, sharp Aggravating factors: sitting too long, car rides Relieving factors: ice,  meds   PRECAUTIONS:  None  RED FLAGS: None   WEIGHT BEARING RESTRICTIONS:  No  FALLS:  Has patient fallen in last 6 months?  Yes. Number of falls 2.Feel back in bathrooom and fell back on toilet  OCCUPATION:  Nurse  PLOF:  Independent  PATIENT GOALS:  Reduce pain, get back to normal function, get rid of walker   OBJECTIVE:   DIAGNOSTIC FINDINGS:  09/04/23 XR IMPRESSION: Left knee arthroplasty without immediate postoperative complication.  PATIENT SURVEYS:  09/30/23 FOTO 40% functional, goal is 64%  COGNITIVE STATUS: Within functional limits for tasks assessed   SENSATION: WFL  GAIT: 09/30/23 Ambulates in with RW independently, does not use AD PLOF for community ambulation   EDEMA: 09/30/23  Knee joint line: Moderate edema noted in left knee   LOWER EXTREMITY ROM:     ROM Left eval Left 10/02/22 Left 10/09/2023  Knee flexion A: 90 P: 99 P:105 106  Knee extension A: 6 P: 4 A:3 -2   (Blank rows = not tested)   LOWER EXTREMITY MMT:    MMT Right eval Left eval  Knee flexion 5 4  Knee extension 5 4   (Blank rows = not tested)    TODAY'S TREATMENT:  10/16/2023 Therex: Recumbent bike seat #6 full circles X 10 min L2 Seated LAQ 5# 2X10 bilat  Manual: left knee PROM with overpressure flexion and extension, extension mobs, manual hamstring stretching  Functional Activities (sit to stand, stairs, curbs): Double Leg Press 68# 2 X10 x full extension (lock left knee out at the top) and stretch into flexion Single Leg Press 37# 2X10 x full extension (lock left knee out at the top) and stretch into flexion March walking 2 round trips in bars with 2UE support, then 2 trips with one UE support, then 2 trips without UE support, followed by gait without AD focusing on increasing stance time and weight shift to left to reduce limp Sit to stands no UE support  X10 Car transfer X 1, verbal cues and demo, good return demonstration from patient.   Vasopneumatic medium pressure 34 degrees left knee 10 minutes  10/14/2023 Therex: Recumbent bike seat #7 full circles X 10 min L2 Slantboard 30  sec X 3  Manual: left knee PROM with overpressure flexion and extension, extension mobs, manual hamstring stretching  Functional Activities (sit to stand, stairs, curbs): Double Leg Press 68# 2 X10 x full extension (lock left knee out at the top) and stretch into flexion Single Leg Press 37# 2X10 x full extension (lock left knee out at the top) and stretch into flexion Gait without UE support throughout clinic 300 feet, 75 feet, in efforts to wean off RW.   Vasopneumatic medium pressure 34 degrees left knee 10 minutes   PATIENT EDUCATION: Education details: HEP, PT plan of care Person educated: Patient Education method: Explanation, Demonstration, Verbal cues, and Handouts Education comprehension: verbalized understanding and needs further education   HOME EXERCISE PROGRAM: Access Code: [email or web address] URL: [email or web address] Date: 10/09/2023 Prepared by: Pauletta Browns  Exercises - Supine Heel Slide with Strap  - 2 x daily - 6 x weekly - 1-2 sets - 10 reps - 5  hold - Supine Knee Extension Stretch on Towel Roll  - 2 x daily - 6 x weekly - 1 sets - 10 reps - 5 sec hold - seated knee flexion stretch with knee extension strengthening  - 2 x daily - 6 x weekly - 1 sets - 15 reps - 5 sec hold - Seated Long Arc Quad (Mirrored)  - 2 x daily - 6 x weekly - 2 sets - 10 reps - Seated Straight Leg Raise with Quad Contraction  - 2 x daily - 6 x weekly - 2 sets - 10 reps - Sit to Stand with Armchair  - 2 x daily - 6 x weekly - 2 sets - 5 reps - alternating marching  - 2 x daily - 6 x weekly - 1 sets - 10 reps - Supine Quadricep Sets  - 5 x daily - 7 x weekly - 2 sets - 10 reps - 5 second hold - Small Range Straight Leg Raise  - 2 x daily - 7 x weekly - 3-5 sets - 5 reps - 3 seconds hold  ASSESSMENT:  CLINICAL IMPRESSION: Her ROM continues to improve each week. She does have slight limp that we are working to correct with PT.  OBJECTIVE IMPAIRMENTS: decreased activity tolerance,  difficulty walking, decreased balance, decreased endurance, decreased functional mobility, decreased ROM, decreased strength, impaired flexibility, impaired LE use, and pain.  ACTIVITY LIMITATIONS: bending, lifting, carry, locomotion, cleaning, community activity, driving, and or occupation  PERSONAL FACTORS: see above PMH are also affecting patient's functional outcome.  REHAB POTENTIAL: Excellent  CLINICAL DECISION MAKING: Stable/uncomplicated  EVALUATION COMPLEXITY: Low    GOALS: Short term PT Goals Target date: 10/28/2023  Pt will be I and compliant with HEP. Baseline:  Goal status: MET 10/07/23 Pt will decrease pain by 25% overall Baseline: Goal status: ongoing 10/09/23  Long term PT goals Target date:11/25/2023   Pt will improve left ROM 0-120 deg to improve functional mobility Baseline: Goal status: ongoing 10/09/23 Pt will improve  hip/knee strength to at least 5-/5 MMT to improve functional strength Baseline: Goal status: ongoing 10/07/23 Pt will improve FOTO to at least 64% functional to show improved function Baseline: Goal status: ongoing 10/07/23 Pt will reduce pain to overall less than 2-3/10 with usual activity and work activity. Baseline: Goal status: ongoing 10/09/23 Pt will be able to ambulate community distances at least 1000 ft WNL gait pattern without complaints Baseline: Goal status: ongoing 10/09/23  PLAN: PT FREQUENCY: 1-3 times per week   PT DURATION: 6-8 weeks  PLANNED INTERVENTIONS (unless contraindicated): aquatic PT, Canalith repositioning, cryotherapy, Electrical stimulation, Iontophoresis with 4 mg/ml dexamethasome, Moist heat, traction, Ultrasound, gait training, Therapeutic exercise, balance training, neuromuscular re-education, patient/family education,manual techniques, passive ROM, dry needling, taping, vasopnuematic device, vestibular, spinal manipulations, joint manipulations 97110-Therapeutic exercises, 97530- Therapeutic activity, 97112-  Neuromuscular re-education, 97535- Self Care, and 91478- Manual therapy  PLAN FOR NEXT SESSION: knee ROM, quadriceps strengthening and gait progressions as appropriate. Vaso to end  Ivery Quale, PT, DPT 10/16/23 3:55 PM

## 2023-10-17 ENCOUNTER — Other Ambulatory Visit: Payer: Self-pay | Admitting: Physician Assistant

## 2023-10-17 ENCOUNTER — Encounter: Payer: Self-pay | Admitting: Orthopaedic Surgery

## 2023-10-17 ENCOUNTER — Other Ambulatory Visit (HOSPITAL_COMMUNITY): Payer: Self-pay

## 2023-10-17 MED ORDER — HYDROCODONE-ACETAMINOPHEN 5-325 MG PO TABS
1.0000 | ORAL_TABLET | Freq: Two times a day (BID) | ORAL | 0 refills | Status: DC | PRN
  Filled 2023-10-17: qty 30, 8d supply, fill #0

## 2023-10-17 NOTE — Telephone Encounter (Signed)
She needs to come in 12 weeks po.  Will refill meds

## 2023-10-19 ENCOUNTER — Other Ambulatory Visit (HOSPITAL_COMMUNITY): Payer: Self-pay

## 2023-10-22 ENCOUNTER — Ambulatory Visit (INDEPENDENT_AMBULATORY_CARE_PROVIDER_SITE_OTHER): Payer: Commercial Managed Care - PPO | Admitting: Physical Therapy

## 2023-10-22 ENCOUNTER — Encounter: Payer: Self-pay | Admitting: Physical Therapy

## 2023-10-22 DIAGNOSIS — M25662 Stiffness of left knee, not elsewhere classified: Secondary | ICD-10-CM | POA: Diagnosis not present

## 2023-10-22 DIAGNOSIS — R600 Localized edema: Secondary | ICD-10-CM | POA: Diagnosis not present

## 2023-10-22 DIAGNOSIS — M6281 Muscle weakness (generalized): Secondary | ICD-10-CM

## 2023-10-22 NOTE — Therapy (Signed)
OUTPATIENT PHYSICAL THERAPY TREATMENT   Patient Name: Julie Hansen MRN: [id number] DOB:1971/09/18, 53 y.o., female Today's Date: 10/22/2023  END OF SESSION:  PT End of Session - 10/22/23 1655     Visit Number 9    Number of Visits 16    Date for PT Re-Evaluation 11/25/23    Authorization Type UMR    PT Start Time 1600    PT Stop Time 1645    PT Time Calculation (min) 45 min    Activity Tolerance Patient tolerated treatment well    Behavior During Therapy WFL for tasks assessed/performed                Past Medical History:  Diagnosis Date   Acid reflux    Anxiety    Asthma    Diabetes mellitus without complication (HCC)    Headache    migraines   Hyperlipidemia    Hypertension    Osteoarthritis of left knee    Palpitations    PCOS (polycystic ovarian syndrome)    PONV (postoperative nausea and vomiting)    Sleep apnea    uses cpap   Past Surgical History:  Procedure Laterality Date   BREAST BIOPSY Left 04/09/2023   BREAST BIOPSY Left 04/09/2023   [place] LT BREAST BX W LOC DEV 1ST LESION IMG BX SPEC US GUIDE 04/09/2023 [facility]   BREAST BIOPSY Left 04/17/2023   MM LT BREAST BX W LOC DEV 1ST LESION IMAGE BX SPEC STEREO GUIDE 04/17/2023 [facility]   BREAST EXCISIONAL BIOPSY Right 06/28/2020   papilloma   DILATION AND EVACUATION N/A 06/05/2017   Procedure: DILATATION AND EVACUATION;  Surgeon: Essie Hart, MD;  Location: [facility];  Service: Gynecology;  Laterality: N/A;   KNEE ARTHROSCOPY WITH MENISCAL REPAIR Left 09/20/2021   Procedure: LEFT KNEE PARTIAL MEDIAL MENISCECTOMY;  Surgeon: Tarry Kos, MD;  Location: [facility];  Service: Orthopedics;  Laterality: Left;   RADIOACTIVE SEED GUIDED EXCISIONAL BREAST BIOPSY Right 06/28/2020   Procedure: RIGHT RADIOACTIVE SEED GUIDED EXCISIONAL BREAST BIOPSY;  Surgeon: Emelia Loron, MD;  Location: [facility];  Service: General;  Laterality: Right;   TOTAL KNEE  ARTHROPLASTY Left 09/04/2023   Procedure: LEFT TOTAL KNEE ARTHROPLASTY;  Surgeon: Tarry Kos, MD;  Location: [facility];  Service: Orthopedics;  Laterality: Left;   WISDOM TOOTH EXTRACTION     Patient Active Problem List   Diagnosis Date Noted   Status post total left knee replacement 09/04/2023   Chronic migraine without aura without status migrainosus, not intractable 04/14/2021   Muscle spasm 06/20/2018   Unspecified dyspareunia (CODE) 03/20/2017   Amenorrhea 03/20/2017   Irritable bowel syndrome 01/01/2017   Status migrainosus 01/01/2017   Anxiety 01/01/2017   Vaginal discharge 12/17/2016   GERD (gastroesophageal reflux disease) 10/30/2016   Mixed hyperlipidemia 10/30/2016   Diabetes type 2, controlled (HCC) 09/12/2015   OSA (obstructive sleep apnea) 02/16/2015   Asthma 10/29/2011    PCP: Eartha Inch, MD  REFERRING PROVIDER: Tarry Kos, MD  REFERRING DIAG: [phone] (ICD-10-CM) - Status post total left knee replacement  Rationale for Evaluation and Treatment: Rehabilitation  THERAPY DIAG:  Stiffness of left knee, not elsewhere classified  Muscle weakness (generalized)  Localized edema  ONSET DATE: DOS: 09/04/23   SUBJECTIVE:  SUBJECTIVE STATEMENT: Mostly walking without RW at home, just uses it for community  PERTINENT HISTORY:  DM, asthma, GERD, HLD, OSA, anxiety, migraines.  PAIN:  NPRS scale: 3/10 upon arrival Pain location:all over but worse in anterior medial knee Pain description: constant, achy, sharp Aggravating factors: sitting too long, car rides Relieving factors: ice,  meds   PRECAUTIONS:  None  RED FLAGS: None   WEIGHT BEARING RESTRICTIONS:  No  FALLS:  Has patient fallen in last 6 months? Yes. Number of falls 2.Feel back in bathrooom and fell back  on toilet  OCCUPATION:  Nurse  PLOF:  Independent  PATIENT GOALS:  Reduce pain, get back to normal function, get rid of walker   OBJECTIVE:   DIAGNOSTIC FINDINGS:  09/04/23 XR IMPRESSION: Left knee arthroplasty without immediate postoperative complication.  PATIENT SURVEYS:  09/30/23 FOTO 40% functional, goal is 64%  COGNITIVE STATUS: Within functional limits for tasks assessed   SENSATION: WFL  GAIT: 09/30/23 Ambulates in with RW independently, does not use AD PLOF for community ambulation   EDEMA: 09/30/23  Knee joint line: Moderate edema noted in left knee   LOWER EXTREMITY ROM:     ROM Left eval Left 10/02/22 Left 10/09/2023 Left 10/22/23  Knee flexion A: 90 P: 99 P:105 106 110 A 120 P  Knee extension A: 6 P: 4 A:3 -2 -1 A   (Blank rows = not tested)   LOWER EXTREMITY MMT:    MMT Right eval Left eval  Knee flexion 5 4  Knee extension 5 4   (Blank rows = not tested)    TODAY'S TREATMENT:  10/22/2023 Therex: Recumbent bike seat #6 full circles X 10 min L2 Seated LAQ 5# 2X10 bilat  Manual: left knee PROM with overpressure flexion and extension, extension mobs, manual hamstring stretching  Functional Activities (sit to stand, stairs, curbs): Double Leg Press 75# 2 X10 x full extension (lock left knee out at the top) and stretch into flexion Single Leg Press 43# 2X10 x full extension (lock left knee out at the top) and stretch into flexion March walking 3 round trips in bars without UE support then sidestepping in bars 3 round trips without UE support then tandem walk 3 round trips with Intermitt UE support PRN Step ups no UE support 6 inch step leading with left leg X 10 forward, X 10 lateral   Vasopneumatic medium pressure 34 degrees left knee 10 minutes  10/16/2023 Therex: Recumbent bike seat #6 full circles X 10 min L2 Seated LAQ 5# 2X10 bilat  Manual: left knee PROM with overpressure flexion and extension, extension mobs, manual  hamstring stretching  Functional Activities (sit to stand, stairs, curbs): Double Leg Press 68# 2 X10 x full extension (lock left knee out at the top) and stretch into flexion Single Leg Press 37# 2X10 x full extension (lock left knee out at the top) and stretch into flexion March walking 2 round trips in bars with 2UE support, then 2 trips with one UE support, then 2 trips without UE support, followed by gait without AD focusing on increasing stance time and weight shift to left to reduce limp Sit to stands no UE support  X10 Car transfer X 1, verbal cues and demo, good return demonstration from patient.   Vasopneumatic medium pressure 34 degrees left knee 10 minutes   PATIENT EDUCATION: Education details: HEP, PT plan of care Person educated: Patient Education method: Explanation, Demonstration, Verbal cues, and Handouts Education comprehension: verbalized understanding and needs further  education   HOME EXERCISE PROGRAM: Access Code: [email or web address] URL: [email or web address] Date: 10/09/2023 Prepared by: Pauletta Browns  Exercises - Supine Heel Slide with Strap  - 2 x daily - 6 x weekly - 1-2 sets - 10 reps - 5 hold - Supine Knee Extension Stretch on Towel Roll  - 2 x daily - 6 x weekly - 1 sets - 10 reps - 5 sec hold - seated knee flexion stretch with knee extension strengthening  - 2 x daily - 6 x weekly - 1 sets - 15 reps - 5 sec hold - Seated Long Arc Quad (Mirrored)  - 2 x daily - 6 x weekly - 2 sets - 10 reps - Seated Straight Leg Raise with Quad Contraction  - 2 x daily - 6 x weekly - 2 sets - 10 reps - Sit to Stand with Armchair  - 2 x daily - 6 x weekly - 2 sets - 5 reps - alternating marching  - 2 x daily - 6 x weekly - 1 sets - 10 reps - Supine Quadricep Sets  - 5 x daily - 7 x weekly - 2 sets - 10 reps - 5 second hold - Small Range Straight Leg Raise  - 2 x daily - 7 x weekly - 3-5 sets - 5 reps - 3 seconds hold  ASSESSMENT:  CLINICAL IMPRESSION: Her knee  ROM is coming along well and now up to 120 deg PROM knee flexion and only missing one degree extension. She will continue to benefit from strength work.  OBJECTIVE IMPAIRMENTS: decreased activity tolerance, difficulty walking, decreased balance, decreased endurance, decreased functional mobility, decreased ROM, decreased strength, impaired flexibility, impaired LE use, and pain.  ACTIVITY LIMITATIONS: bending, lifting, carry, locomotion, cleaning, community activity, driving, and or occupation  PERSONAL FACTORS: see above PMH are also affecting patient's functional outcome.  REHAB POTENTIAL: Excellent  CLINICAL DECISION MAKING: Stable/uncomplicated  EVALUATION COMPLEXITY: Low    GOALS: Short term PT Goals Target date: 10/28/2023  Pt will be I and compliant with HEP. Baseline:  Goal status: MET 10/07/23 Pt will decrease pain by 25% overall Baseline: Goal status: ongoing 10/09/23  Long term PT goals Target date:11/25/2023   Pt will improve left ROM 0-120 deg to improve functional mobility Baseline: Goal status: ongoing 10/09/23 Pt will improve  hip/knee strength to at least 5-/5 MMT to improve functional strength Baseline: Goal status: ongoing 10/07/23 Pt will improve FOTO to at least 64% functional to show improved function Baseline: Goal status: ongoing 10/07/23 Pt will reduce pain to overall less than 2-3/10 with usual activity and work activity. Baseline: Goal status: ongoing 10/09/23 Pt will be able to ambulate community distances at least 1000 ft WNL gait pattern without complaints Baseline: Goal status: ongoing 10/09/23  PLAN: PT FREQUENCY: 1-3 times per week   PT DURATION: 6-8 weeks  PLANNED INTERVENTIONS (unless contraindicated): aquatic PT, Canalith repositioning, cryotherapy, Electrical stimulation, Iontophoresis with 4 mg/ml dexamethasome, Moist heat, traction, Ultrasound, gait training, Therapeutic exercise, balance training, neuromuscular re-education,  patient/family education,manual techniques, passive ROM, dry needling, taping, vasopnuematic device, vestibular, spinal manipulations, joint manipulations 97110-Therapeutic exercises, 97530- Therapeutic activity, 97112- Neuromuscular re-education, 97535- Self Care, and 16109- Manual therapy  PLAN FOR NEXT SESSION: knee ROM, quadriceps strengthening and gait progressions as appropriate. Vaso to end  Ivery Quale, PT, DPT 10/22/23 4:56 PM

## 2023-10-24 ENCOUNTER — Encounter: Payer: Self-pay | Admitting: Physical Therapy

## 2023-10-24 ENCOUNTER — Other Ambulatory Visit (HOSPITAL_COMMUNITY): Payer: Self-pay

## 2023-10-24 ENCOUNTER — Ambulatory Visit: Payer: Commercial Managed Care - PPO | Admitting: Physical Therapy

## 2023-10-24 ENCOUNTER — Encounter: Payer: Self-pay | Admitting: Orthopaedic Surgery

## 2023-10-24 ENCOUNTER — Other Ambulatory Visit: Payer: Self-pay

## 2023-10-24 DIAGNOSIS — M6281 Muscle weakness (generalized): Secondary | ICD-10-CM | POA: Diagnosis not present

## 2023-10-24 DIAGNOSIS — R600 Localized edema: Secondary | ICD-10-CM | POA: Diagnosis not present

## 2023-10-24 DIAGNOSIS — M25662 Stiffness of left knee, not elsewhere classified: Secondary | ICD-10-CM | POA: Diagnosis not present

## 2023-10-24 DIAGNOSIS — M25562 Pain in left knee: Secondary | ICD-10-CM | POA: Diagnosis not present

## 2023-10-24 MED ORDER — MELOXICAM 15 MG PO TABS
15.0000 mg | ORAL_TABLET | Freq: Every day | ORAL | 1 refills | Status: DC
  Filled 2023-10-24: qty 30, 30d supply, fill #0
  Filled 2023-12-12: qty 30, 30d supply, fill #1

## 2023-10-24 NOTE — Therapy (Signed)
OUTPATIENT PHYSICAL THERAPY TREATMENT   Patient Name: Julie Hansen MRN: [id number] DOB:[birth year], 53 y.o., female Today's Date: 10/24/2023  END OF SESSION:  PT End of Session - 10/24/23 1538     Visit Number 10    Number of Visits 16    Date for PT Re-Evaluation 11/25/23    Authorization Type UMR    PT Start Time 1515    PT Stop Time 1600    PT Time Calculation (min) 45 min    Activity Tolerance Patient tolerated treatment well    Behavior During Therapy WFL for tasks assessed/performed                 Past Medical History:  Diagnosis Date   Acid reflux    Anxiety    Asthma    Diabetes mellitus without complication (HCC)    Headache    migraines   Hyperlipidemia    Hypertension    Osteoarthritis of left knee    Palpitations    PCOS (polycystic ovarian syndrome)    PONV (postoperative nausea and vomiting)    Sleep apnea    uses cpap   Past Surgical History:  Procedure Laterality Date   BREAST BIOPSY Left 04/09/2023   BREAST BIOPSY Left 04/09/2023   [place] LT BREAST BX W LOC DEV 1ST LESION IMG BX SPEC US GUIDE 04/09/2023 [facility]   BREAST BIOPSY Left 04/17/2023   MM LT BREAST BX W LOC DEV 1ST LESION IMAGE BX SPEC STEREO GUIDE 04/17/2023 [facility]   BREAST EXCISIONAL BIOPSY Right 06/28/2020   papilloma   DILATION AND EVACUATION N/A 06/05/2017   Procedure: DILATATION AND EVACUATION;  Surgeon: Essie Hart, MD;  Location: [facility];  Service: Gynecology;  Laterality: N/A;   KNEE ARTHROSCOPY WITH MENISCAL REPAIR Left 09/20/2021   Procedure: LEFT KNEE PARTIAL MEDIAL MENISCECTOMY;  Surgeon: Tarry Kos, MD;  Location: [facility];  Service: Orthopedics;  Laterality: Left;   RADIOACTIVE SEED GUIDED EXCISIONAL BREAST BIOPSY Right 06/28/2020   Procedure: RIGHT RADIOACTIVE SEED GUIDED EXCISIONAL BREAST BIOPSY;  Surgeon: Emelia Loron, MD;  Location: [facility];  Service: General;  Laterality: Right;   TOTAL KNEE  ARTHROPLASTY Left 09/04/2023   Procedure: LEFT TOTAL KNEE ARTHROPLASTY;  Surgeon: Tarry Kos, MD;  Location: [facility];  Service: Orthopedics;  Laterality: Left;   WISDOM TOOTH EXTRACTION     Patient Active Problem List   Diagnosis Date Noted   Status post total left knee replacement 09/04/2023   Chronic migraine without aura without status migrainosus, not intractable 04/14/2021   Muscle spasm 06/20/2018   Unspecified dyspareunia (CODE) 03/20/2017   Amenorrhea 03/20/2017   Irritable bowel syndrome 01/01/2017   Status migrainosus 01/01/2017   Anxiety 01/01/2017   Vaginal discharge 12/17/2016   GERD (gastroesophageal reflux disease) 10/30/2016   Mixed hyperlipidemia 10/30/2016   Diabetes type 2, controlled (HCC) 09/12/2015   OSA (obstructive sleep apnea) 02/16/2015   Asthma 10/29/2011    PCP: Eartha Inch, MD  REFERRING PROVIDER: Tarry Kos, MD  REFERRING DIAG: [phone] (ICD-10-CM) - Status post total left knee replacement  Rationale for Evaluation and Treatment: Rehabilitation  THERAPY DIAG:  Stiffness of left knee, not elsewhere classified  Muscle weakness (generalized)  Localized edema  Acute pain of left knee  ONSET DATE: DOS: 09/04/23   SUBJECTIVE:  SUBJECTIVE STATEMENT: Mostly walking without RW at home, just uses it for community  PERTINENT HISTORY:  DM, asthma, GERD, HLD, OSA, anxiety, migraines.  PAIN:  NPRS scale: 3/10 upon arrival Pain location:all over but worse in anterior medial knee Pain description: constant, achy, sharp Aggravating factors: sitting too long, car rides Relieving factors: ice,  meds   PRECAUTIONS:  None  RED FLAGS: None   WEIGHT BEARING RESTRICTIONS:  No  FALLS:  Has patient fallen in last 6 months? Yes. Number of falls 2.Feel back  in bathrooom and fell back on toilet  OCCUPATION:  Nurse  PLOF:  Independent  PATIENT GOALS:  Reduce pain, get back to normal function, get rid of walker   OBJECTIVE:   DIAGNOSTIC FINDINGS:  09/04/23 XR IMPRESSION: Left knee arthroplasty without immediate postoperative complication.  PATIENT SURVEYS:  09/30/23 FOTO 40% functional, goal is 64%  COGNITIVE STATUS: Within functional limits for tasks assessed   SENSATION: WFL  GAIT: 09/30/23 Ambulates in with RW independently, does not use AD PLOF for community ambulation   EDEMA: 09/30/23  Knee joint line: Moderate edema noted in left knee   LOWER EXTREMITY ROM:     ROM Left eval Left 10/02/22 Left 10/09/2023 Left 10/22/23  Knee flexion A: 90 P: 99 P:105 106 110 A 120 P  Knee extension A: 6 P: 4 A:3 -2 -1 A   (Blank rows = not tested)   LOWER EXTREMITY MMT:    MMT Right eval Left eval  Knee flexion 5 4  Knee extension 5 4   (Blank rows = not tested)    TODAY'S TREATMENT:  10/24/2023 Therex: Scifit bike L3 X 10 min Seated LAQ 5# 2X10 bilat Shoe recommendations  Functional Activities (sit to stand, stairs, curbs): Double Leg Press 75# 2 X10  Single Leg Press 43# 2X10 x full extension  Sit to stands no UE support 2X10 Step ups no UE support 8 inch step X 5, then 6 inch step leading with left leg X 10 forward, X 10 lateral   Vasopneumatic medium pressure 34 degrees left knee 10 minutes  10/22/2023 Therex: Recumbent bike seat #6 full circles X 10 min L2 Seated LAQ 5# 2X10 bilat  Manual: left knee PROM with overpressure flexion and extension, extension mobs, manual hamstring stretching  Functional Activities (sit to stand, stairs, curbs): Double Leg Press 75# 2 X10 x full extension (lock left knee out at the top) and stretch into flexion Single Leg Press 43# 2X10 x full extension (lock left knee out at the top) and stretch into flexion March walking 3 round trips in bars without UE support then  sidestepping in bars 3 round trips without UE support then tandem walk 3 round trips with Intermitt UE support PRN Step ups no UE support 6 inch step leading with left leg X 10 forward, X 10 lateral   Vasopneumatic medium pressure 34 degrees left knee 10 minutes  10/16/2023 Therex: Recumbent bike seat #6 full circles X 10 min L2 Seated LAQ 5# 2X10 bilat  Manual: left knee PROM with overpressure flexion and extension, extension mobs, manual hamstring stretching  Functional Activities (sit to stand, stairs, curbs): Double Leg Press 68# 2 X10 x full extension (lock left knee out at the top) and stretch into flexion Single Leg Press 37# 2X10 x full extension (lock left knee out at the top) and stretch into flexion March walking 2 round trips in bars with 2UE support, then 2 trips with one UE support, then 2 trips  without UE support, followed by gait without AD focusing on increasing stance time and weight shift to left to reduce limp Sit to stands no UE support  X10 Car transfer X 1, verbal cues and demo, good return demonstration from patient.   Vasopneumatic medium pressure 34 degrees left knee 10 minutes   PATIENT EDUCATION: Education details: HEP, PT plan of care Person educated: Patient Education method: Explanation, Demonstration, Verbal cues, and Handouts Education comprehension: verbalized understanding and needs further education   HOME EXERCISE PROGRAM: Access Code: [email or web address] URL: [email or web address] Date: 10/09/2023 Prepared by: Pauletta Browns  Exercises - Supine Heel Slide with Strap  - 2 x daily - 6 x weekly - 1-2 sets - 10 reps - 5 hold - Supine Knee Extension Stretch on Towel Roll  - 2 x daily - 6 x weekly - 1 sets - 10 reps - 5 sec hold - seated knee flexion stretch with knee extension strengthening  - 2 x daily - 6 x weekly - 1 sets - 15 reps - 5 sec hold - Seated Long Arc Quad (Mirrored)  - 2 x daily - 6 x weekly - 2 sets - 10 reps - Seated  Straight Leg Raise with Quad Contraction  - 2 x daily - 6 x weekly - 2 sets - 10 reps - Sit to Stand with Armchair  - 2 x daily - 6 x weekly - 2 sets - 5 reps - alternating marching  - 2 x daily - 6 x weekly - 1 sets - 10 reps - Supine Quadricep Sets  - 5 x daily - 7 x weekly - 2 sets - 10 reps - 5 second hold - Small Range Straight Leg Raise  - 2 x daily - 7 x weekly - 3-5 sets - 5 reps - 3 seconds hold  ASSESSMENT:  CLINICAL IMPRESSION: She arrives for first time without assistance device for ambulation. She is overall progressing well with PT and we will continue to work as tolerated towards improving function.  OBJECTIVE IMPAIRMENTS: decreased activity tolerance, difficulty walking, decreased balance, decreased endurance, decreased functional mobility, decreased ROM, decreased strength, impaired flexibility, impaired LE use, and pain.  ACTIVITY LIMITATIONS: bending, lifting, carry, locomotion, cleaning, community activity, driving, and or occupation  PERSONAL FACTORS: see above PMH are also affecting patient's functional outcome.  REHAB POTENTIAL: Excellent  CLINICAL DECISION MAKING: Stable/uncomplicated  EVALUATION COMPLEXITY: Low    GOALS: Short term PT Goals Target date: 10/28/2023  Pt will be I and compliant with HEP. Baseline:  Goal status: MET 10/07/23 Pt will decrease pain by 25% overall Baseline: Goal status: ongoing 10/09/23  Long term PT goals Target date:11/25/2023   Pt will improve left ROM 0-120 deg to improve functional mobility Baseline: Goal status: ongoing 10/09/23 Pt will improve  hip/knee strength to at least 5-/5 MMT to improve functional strength Baseline: Goal status: ongoing 10/07/23 Pt will improve FOTO to at least 64% functional to show improved function Baseline: Goal status: ongoing 10/07/23 Pt will reduce pain to overall less than 2-3/10 with usual activity and work activity. Baseline: Goal status: ongoing 10/09/23 Pt will be able to ambulate  community distances at least 1000 ft WNL gait pattern without complaints Baseline: Goal status: ongoing 10/09/23  PLAN: PT FREQUENCY: 1-3 times per week   PT DURATION: 6-8 weeks  PLANNED INTERVENTIONS (unless contraindicated): aquatic PT, Canalith repositioning, cryotherapy, Electrical stimulation, Iontophoresis with 4 mg/ml dexamethasome, Moist heat, traction, Ultrasound, gait training, Therapeutic exercise, balance training, neuromuscular re-education, patient/family education,manual techniques,  passive ROM, dry needling, taping, vasopnuematic device, vestibular, spinal manipulations, joint manipulations 97110-Therapeutic exercises, 97530- Therapeutic activity, [id number]- Neuromuscular re-education, 97535- Self Care, and [id number]- Manual therapy  PLAN FOR NEXT SESSION: knee ROM, quadriceps strengthening and gait progressions as appropriate. Vaso to end  Ivery Quale, PT, DPT 10/24/23 3:38 PM

## 2023-10-28 ENCOUNTER — Ambulatory Visit: Payer: Commercial Managed Care - PPO | Admitting: Physical Therapy

## 2023-10-28 ENCOUNTER — Other Ambulatory Visit: Payer: Self-pay | Admitting: Physician Assistant

## 2023-10-28 ENCOUNTER — Other Ambulatory Visit: Payer: Self-pay

## 2023-10-28 ENCOUNTER — Ambulatory Visit: Payer: Commercial Managed Care - PPO | Admitting: Physician Assistant

## 2023-10-28 ENCOUNTER — Ambulatory Visit (INDEPENDENT_AMBULATORY_CARE_PROVIDER_SITE_OTHER): Payer: Commercial Managed Care - PPO

## 2023-10-28 ENCOUNTER — Other Ambulatory Visit (HOSPITAL_COMMUNITY): Payer: Self-pay

## 2023-10-28 ENCOUNTER — Encounter: Payer: Self-pay | Admitting: Physical Therapy

## 2023-10-28 ENCOUNTER — Other Ambulatory Visit (HOSPITAL_BASED_OUTPATIENT_CLINIC_OR_DEPARTMENT_OTHER): Payer: Self-pay

## 2023-10-28 DIAGNOSIS — M25662 Stiffness of left knee, not elsewhere classified: Secondary | ICD-10-CM | POA: Diagnosis not present

## 2023-10-28 DIAGNOSIS — M25562 Pain in left knee: Secondary | ICD-10-CM | POA: Diagnosis not present

## 2023-10-28 DIAGNOSIS — G43009 Migraine without aura, not intractable, without status migrainosus: Secondary | ICD-10-CM

## 2023-10-28 DIAGNOSIS — R600 Localized edema: Secondary | ICD-10-CM | POA: Diagnosis not present

## 2023-10-28 DIAGNOSIS — Z96652 Presence of left artificial knee joint: Secondary | ICD-10-CM | POA: Diagnosis not present

## 2023-10-28 DIAGNOSIS — M6281 Muscle weakness (generalized): Secondary | ICD-10-CM | POA: Diagnosis not present

## 2023-10-28 MED ORDER — HYDROCODONE-ACETAMINOPHEN 7.5-325 MG PO TABS
1.0000 | ORAL_TABLET | Freq: Two times a day (BID) | ORAL | 0 refills | Status: DC | PRN
  Filled 2023-10-28: qty 28, 7d supply, fill #0

## 2023-10-28 NOTE — Therapy (Signed)
OUTPATIENT PHYSICAL THERAPY TREATMENT   Patient Name: Julie Hansen MRN: [id number] DOB:22-Oct-1970, 53 y.o., female Today's Date: 10/28/2023  END OF SESSION:  PT End of Session - 10/28/23 1304     Visit Number 11    Number of Visits 16    Date for PT Re-Evaluation 11/25/23    Authorization Type UMR    PT Start Time 1300    PT Stop Time 1345    PT Time Calculation (min) 45 min    Activity Tolerance Patient tolerated treatment well    Behavior During Therapy WFL for tasks assessed/performed                 Past Medical History:  Diagnosis Date   Acid reflux    Anxiety    Asthma    Diabetes mellitus without complication (HCC)    Headache    migraines   Hyperlipidemia    Hypertension    Osteoarthritis of left knee    Palpitations    PCOS (polycystic ovarian syndrome)    PONV (postoperative nausea and vomiting)    Sleep apnea    uses cpap   Past Surgical History:  Procedure Laterality Date   BREAST BIOPSY Left 04/09/2023   BREAST BIOPSY Left 04/09/2023   [place] LT BREAST BX W LOC DEV 1ST LESION IMG BX SPEC US GUIDE 04/09/2023 [facility]   BREAST BIOPSY Left 04/17/2023   MM LT BREAST BX W LOC DEV 1ST LESION IMAGE BX SPEC STEREO GUIDE 04/17/2023 [facility]   BREAST EXCISIONAL BIOPSY Right 06/28/2020   papilloma   DILATION AND EVACUATION N/A 06/05/2017   Procedure: DILATATION AND EVACUATION;  Surgeon: Essie Hart, MD;  Location: [facility];  Service: Gynecology;  Laterality: N/A;   KNEE ARTHROSCOPY WITH MENISCAL REPAIR Left 09/20/2021   Procedure: LEFT KNEE PARTIAL MEDIAL MENISCECTOMY;  Surgeon: Tarry Kos, MD;  Location: [facility];  Service: Orthopedics;  Laterality: Left;   RADIOACTIVE SEED GUIDED EXCISIONAL BREAST BIOPSY Right 06/28/2020   Procedure: RIGHT RADIOACTIVE SEED GUIDED EXCISIONAL BREAST BIOPSY;  Surgeon: Emelia Loron, MD;  Location: [facility];  Service: General;  Laterality: Right;   TOTAL KNEE  ARTHROPLASTY Left 09/04/2023   Procedure: LEFT TOTAL KNEE ARTHROPLASTY;  Surgeon: Tarry Kos, MD;  Location: [facility];  Service: Orthopedics;  Laterality: Left;   WISDOM TOOTH EXTRACTION     Patient Active Problem List   Diagnosis Date Noted   Status post total left knee replacement 09/04/2023   Chronic migraine without aura without status migrainosus, not intractable 04/14/2021   Muscle spasm 06/20/2018   Unspecified dyspareunia (CODE) 03/20/2017   Amenorrhea 03/20/2017   Irritable bowel syndrome 01/01/2017   Status migrainosus 01/01/2017   Anxiety 01/01/2017   Vaginal discharge 12/17/2016   GERD (gastroesophageal reflux disease) 10/30/2016   Mixed hyperlipidemia 10/30/2016   Diabetes type 2, controlled (HCC) 09/12/2015   OSA (obstructive sleep apnea) 02/16/2015   Asthma 10/29/2011    PCP: Eartha Inch, MD  REFERRING PROVIDER: Tarry Kos, MD  REFERRING DIAG: [phone] (ICD-10-CM) - Status post total left knee replacement  Rationale for Evaluation and Treatment: Rehabilitation  THERAPY DIAG:  Stiffness of left knee, not elsewhere classified  Muscle weakness (generalized)  Localized edema  Acute pain of left knee  ONSET DATE: DOS: 09/04/23   SUBJECTIVE:  SUBJECTIVE STATEMENT: She is driving herself now, walking without AD. Does still get some pain at times.   PERTINENT HISTORY:  DM, asthma, GERD, HLD, OSA, anxiety, migraines.  PAIN:  NPRS scale: 3/10 upon arrival Pain location:all over but worse in anterior medial knee Pain description: constant, achy, sharp Aggravating factors: sitting too long, car rides Relieving factors: ice,  meds   PRECAUTIONS:  None  RED FLAGS: None   WEIGHT BEARING RESTRICTIONS:  No  FALLS:  Has patient fallen in last 6 months? Yes.  Number of falls 2.Feel back in bathrooom and fell back on toilet  OCCUPATION:  Nurse  PLOF:  Independent  PATIENT GOALS:  Reduce pain, get back to normal function, get rid of walker   OBJECTIVE:   DIAGNOSTIC FINDINGS:  09/04/23 XR IMPRESSION: Left knee arthroplasty without immediate postoperative complication.  PATIENT SURVEYS:  09/30/23 FOTO 40% functional, goal is 64%  COGNITIVE STATUS: Within functional limits for tasks assessed   SENSATION: WFL  GAIT: 09/30/23 Ambulates in with RW independently, does not use AD PLOF for community ambulation   EDEMA: 09/30/23  Knee joint line: Moderate edema noted in left knee   LOWER EXTREMITY ROM:     ROM Left eval Left 10/02/22 Left 10/09/2023 Left 10/22/23 Left 10/28/23  Knee flexion A: 90 P: 99 P:105 106 110 A 120 P   Knee extension A: 6 P: 4 A:3 -2 -1 A 0   (Blank rows = not tested)   LOWER EXTREMITY MMT:    MMT Right eval Left eval  Knee flexion 5 4  Knee extension 5 4   (Blank rows = not tested)    TODAY'S TREATMENT:  10/28/2023 Therex: Recumbent bike seat #6 full circles X 10 min L2 Slantboard 30 sec X 3 Seated LAQ 5# 2X15 bilat  Manual: left knee PROM with overpressure flexion and extension, manual hamstring stretching  Functional Activities (sit to stand, stairs, curbs): Double Leg Press 75# 2 X10 x full extension (lock left knee out at the top) and stretch into flexion Single Leg Press 43# 2X10 x full extension (lock left knee out at the top) and stretch into flexion Step ups no UE support 6 inch step leading with left leg X 10 forward, X 10 lateral   10/24/2023 Therex: Scifit bike L3 X 10 min Seated LAQ 5# 2X10 bilat Shoe recommendations  Functional Activities (sit to stand, stairs, curbs): Double Leg Press 75# 2 X10  Single Leg Press 43# 2X10 x full extension  Sit to stands no UE support 2X10 Step ups no UE support 8 inch step X 5, then 6 inch step leading with left leg X 10 forward, X 10  lateral   Vasopneumatic medium pressure 34 degrees left knee 10 minutes   PATIENT EDUCATION: Education details: HEP, PT plan of care Person educated: Patient Education method: Explanation, Demonstration, Verbal cues, and Handouts Education comprehension: verbalized understanding and needs further education   HOME EXERCISE PROGRAM: Access Code: [email or web address] URL: [email or web address] Date: 10/09/2023 Prepared by: Pauletta Browns  Exercises - Supine Heel Slide with Strap  - 2 x daily - 6 x weekly - 1-2 sets - 10 reps - 5 hold - Supine Knee Extension Stretch on Towel Roll  - 2 x daily - 6 x weekly - 1 sets - 10 reps - 5 sec hold - seated knee flexion stretch with knee extension strengthening  - 2 x daily - 6 x weekly - 1 sets - 15 reps - 5 sec hold - Seated  Long Arc Quad (Mirrored)  - 2 x daily - 6 x weekly - 2 sets - 10 reps - Seated Straight Leg Raise with Quad Contraction  - 2 x daily - 6 x weekly - 2 sets - 10 reps - Sit to Stand with Armchair  - 2 x daily - 6 x weekly - 2 sets - 5 reps - alternating marching  - 2 x daily - 6 x weekly - 1 sets - 10 reps - Supine Quadricep Sets  - 5 x daily - 7 x weekly - 2 sets - 10 reps - 5 second hold - Small Range Straight Leg Raise  - 2 x daily - 7 x weekly - 3-5 sets - 5 reps - 3 seconds hold  ASSESSMENT:  CLINICAL IMPRESSION: Knee ROM doing very well, knee is straight now 0 deg of extension. She does still have mild limp and will continue to benefit from more strengthening.   OBJECTIVE IMPAIRMENTS: decreased activity tolerance, difficulty walking, decreased balance, decreased endurance, decreased functional mobility, decreased ROM, decreased strength, impaired flexibility, impaired LE use, and pain.  ACTIVITY LIMITATIONS: bending, lifting, carry, locomotion, cleaning, community activity, driving, and or occupation  PERSONAL FACTORS: see above PMH are also affecting patient's functional outcome.  REHAB POTENTIAL:  Excellent  CLINICAL DECISION MAKING: Stable/uncomplicated  EVALUATION COMPLEXITY: Low    GOALS: Short term PT Goals Target date: 10/28/2023  Pt will be I and compliant with HEP. Baseline:  Goal status: MET 10/07/23 Pt will decrease pain by 25% overall Baseline: Goal status: ongoing 10/09/23  Long term PT goals Target date:11/25/2023   Pt will improve left ROM 0-120 deg to improve functional mobility Baseline: Goal status: ongoing 10/09/23 Pt will improve  hip/knee strength to at least 5-/5 MMT to improve functional strength Baseline: Goal status: ongoing 10/07/23 Pt will improve FOTO to at least 64% functional to show improved function Baseline: Goal status: ongoing 10/07/23 Pt will reduce pain to overall less than 2-3/10 with usual activity and work activity. Baseline: Goal status: ongoing 10/09/23 Pt will be able to ambulate community distances at least 1000 ft WNL gait pattern without complaints Baseline: Goal status: ongoing 10/09/23  PLAN: PT FREQUENCY: 1-3 times per week   PT DURATION: 6-8 weeks  PLANNED INTERVENTIONS (unless contraindicated): aquatic PT, Canalith repositioning, cryotherapy, Electrical stimulation, Iontophoresis with 4 mg/ml dexamethasome, Moist heat, traction, Ultrasound, gait training, Therapeutic exercise, balance training, neuromuscular re-education, patient/family education,manual techniques, passive ROM, dry needling, taping, vasopnuematic device, vestibular, spinal manipulations, joint manipulations 97110-Therapeutic exercises, 97530- Therapeutic activity, 97112- Neuromuscular re-education, 97535- Self Care, and 16109- Manual therapy  PLAN FOR NEXT SESSION: quadriceps strengthening and functional strenth and gait progressions as appropriate. Vaso if desired  Ivery Quale, PT, DPT 10/28/23 1:17 PM

## 2023-10-28 NOTE — Progress Notes (Signed)
Post-Op Visit Note   Patient: Julie Hansen           Date of Birth: 1971-08-10           MRN: [id number] Visit Date: 10/28/2023 PCP: Eartha Inch, MD   Assessment & Plan:  Chief Complaint:  Chief Complaint  Patient presents with   Left Knee - Follow-up    Left total knee arthroplasty 09/04/2023   Visit Diagnoses:  1. Status post total left knee replacement     Plan: It is a pleasant 53 year old female who comes in today approximately 8 weeks status post left total knee replacement 09/04/2023.  She has been doing fine most days but has had increased pain primarily on PT days which has occasionally limited her with progression in therapy.  She feels the Norco 01/25/2024 does not touch her pain.  She was on tramadol following her knee scope and thinks she may have developed a little bit of a tolerance from this.  Examination of the left knee reveals range of motion from approximately 5 to 115 degrees.  She is neurovascularly intact distally.  Today, we discussed increasing the Norco to 7.5 mg.  She may take 2 of these at a time as needed.  She will try and wean off as able.  Dental prophylaxis reinforced.  Follow-up in 4 weeks for recheck and possible repeat x-rays of the left knee.  Call with concerns or questions.  Follow-Up Instructions: Return in about 4 weeks (around 11/25/2023).   Orders:  Orders Placed This Encounter  Procedures   XR Knee 1-2 Views Left   Meds ordered this encounter  Medications   HYDROcodone-acetaminophen (NORCO) 7.5-325 MG tablet    Sig: Take 1-2 tablets by mouth 2 (two) times daily as needed for moderate pain (pain score 4-6).    Dispense:  28 tablet    Refill:  0    Imaging: No results found.  PMFS History: Patient Active Problem List   Diagnosis Date Noted   Status post total left knee replacement 09/04/2023   Chronic migraine without aura without status migrainosus, not intractable 04/14/2021   Muscle spasm 06/20/2018    Unspecified dyspareunia (CODE) 03/20/2017   Amenorrhea 03/20/2017   Irritable bowel syndrome 01/01/2017   Status migrainosus 01/01/2017   Anxiety 01/01/2017   Vaginal discharge 12/17/2016   GERD (gastroesophageal reflux disease) 10/30/2016   Mixed hyperlipidemia 10/30/2016   Diabetes type 2, controlled (HCC) 09/12/2015   OSA (obstructive sleep apnea) 02/16/2015   Asthma 10/29/2011   Past Medical History:  Diagnosis Date   Acid reflux    Anxiety    Asthma    Diabetes mellitus without complication (HCC)    Headache    migraines   Hyperlipidemia    Hypertension    Osteoarthritis of left knee    Palpitations    PCOS (polycystic ovarian syndrome)    PONV (postoperative nausea and vomiting)    Sleep apnea    uses cpap    Family History  Problem Relation Age of Onset   Cancer Mother    Hypertension Mother    Diabetes Mother    Breast cancer Mother 54   Cancer Maternal Grandmother    Breast cancer Maternal Grandmother 59   Cancer Father    Diabetes Father    Hypertension Father    Breast cancer Paternal Aunt 81   Breast cancer Paternal Aunt 92   Breast cancer Paternal Aunt 25    Past Surgical History:  Procedure Laterality Date  BREAST BIOPSY Left 04/09/2023   BREAST BIOPSY Left 04/09/2023   [place] LT BREAST BX W LOC DEV 1ST LESION IMG BX SPEC US GUIDE 04/09/2023 [facility]   BREAST BIOPSY Left 04/17/2023   MM LT BREAST BX W LOC DEV 1ST LESION IMAGE BX SPEC STEREO GUIDE 04/17/2023 [facility]   BREAST EXCISIONAL BIOPSY Right 06/28/2020   papilloma   DILATION AND EVACUATION N/A 06/05/2017   Procedure: DILATATION AND EVACUATION;  Surgeon: Essie Hart, MD;  Location: [facility];  Service: Gynecology;  Laterality: N/A;   KNEE ARTHROSCOPY WITH MENISCAL REPAIR Left 09/20/2021   Procedure: LEFT KNEE PARTIAL MEDIAL MENISCECTOMY;  Surgeon: Tarry Kos, MD;  Location: [facility];  Service: Orthopedics;  Laterality: Left;   RADIOACTIVE SEED GUIDED EXCISIONAL  BREAST BIOPSY Right 06/28/2020   Procedure: RIGHT RADIOACTIVE SEED GUIDED EXCISIONAL BREAST BIOPSY;  Surgeon: Emelia Loron, MD;  Location: [facility];  Service: General;  Laterality: Right;   TOTAL KNEE ARTHROPLASTY Left 09/04/2023   Procedure: LEFT TOTAL KNEE ARTHROPLASTY;  Surgeon: Tarry Kos, MD;  Location: [facility];  Service: Orthopedics;  Laterality: Left;   WISDOM TOOTH EXTRACTION     Social History   Occupational History   Not on file  Tobacco Use   Smoking status: Never    Passive exposure: Never   Smokeless tobacco: Never  Vaping Use   Vaping status: Never Used  Substance and Sexual Activity   Alcohol use: Yes    Comment: maybe a drink once a month   Drug use: No   Sexual activity: Yes    Birth control/protection: None

## 2023-10-29 ENCOUNTER — Encounter: Payer: Self-pay | Admitting: Physical Therapy

## 2023-10-29 ENCOUNTER — Ambulatory Visit: Payer: Commercial Managed Care - PPO | Admitting: Physical Therapy

## 2023-10-29 ENCOUNTER — Other Ambulatory Visit (HOSPITAL_COMMUNITY): Payer: Self-pay

## 2023-10-29 ENCOUNTER — Ambulatory Visit
Admission: RE | Admit: 2023-10-29 | Discharge: 2023-10-29 | Disposition: A | Discharge status: Home / Self Care | Payer: Commercial Managed Care - PPO | Source: Ambulatory Visit | Attending: Adult Health Nurse Practitioner | Admitting: Adult Health Nurse Practitioner

## 2023-10-29 DIAGNOSIS — Z803 Family history of malignant neoplasm of breast: Secondary | ICD-10-CM | POA: Diagnosis not present

## 2023-10-29 DIAGNOSIS — M25662 Stiffness of left knee, not elsewhere classified: Secondary | ICD-10-CM | POA: Diagnosis not present

## 2023-10-29 DIAGNOSIS — M25562 Pain in left knee: Secondary | ICD-10-CM

## 2023-10-29 DIAGNOSIS — M6281 Muscle weakness (generalized): Secondary | ICD-10-CM

## 2023-10-29 DIAGNOSIS — R600 Localized edema: Secondary | ICD-10-CM | POA: Diagnosis not present

## 2023-10-29 DIAGNOSIS — R928 Other abnormal and inconclusive findings on diagnostic imaging of breast: Secondary | ICD-10-CM | POA: Diagnosis not present

## 2023-10-29 MED ORDER — ALPRAZOLAM 1 MG PO TABS
ORAL_TABLET | ORAL | 0 refills | Status: DC
  Filled 2023-10-29: qty 60, 30d supply, fill #0

## 2023-10-29 MED ORDER — FLUOXETINE HCL 20 MG PO CAPS
20.0000 mg | ORAL_CAPSULE | Freq: Every day | ORAL | 1 refills | Status: AC
  Filled 2023-10-29: qty 90, 90d supply, fill #0
  Filled 2024-01-31: qty 90, 90d supply, fill #1

## 2023-10-29 MED ORDER — GADOPICLENOL 0.5 MMOL/ML IV SOLN
10.0000 mL | Freq: Once | INTRAVENOUS | Status: AC | PRN
  Administered 2023-10-29: 10 mL via INTRAVENOUS

## 2023-10-29 NOTE — Therapy (Signed)
 OUTPATIENT PHYSICAL THERAPY TREATMENT   Patient Name: Julie Hansen MRN: [id number] DOB:03-Jul-1971, 53 y.o., female Today's Date: 10/29/2023  END OF SESSION:  PT End of Session - 10/29/23 1409     Visit Number 12    Number of Visits 16    Date for PT Re-Evaluation 11/25/23    Authorization Type [employer]    PT Start Time 1344    PT Stop Time 1425    PT Time Calculation (min) 41 min    Activity Tolerance Patient tolerated treatment well    Behavior During Therapy WFL for tasks assessed/performed                 Past Medical History:  Diagnosis Date   Acid reflux    Anxiety    Asthma    Diabetes mellitus without complication (HCC)    Headache    migraines   Hyperlipidemia    Hypertension    Osteoarthritis of left knee    Palpitations    PCOS (polycystic ovarian syndrome)    PONV (postoperative nausea and vomiting)    Sleep apnea    uses cpap   Past Surgical History:  Procedure Laterality Date   BREAST BIOPSY Left 04/09/2023   BREAST BIOPSY Left 04/09/2023   US  LT BREAST BX W LOC DEV 1ST LESION IMG BX SPEC US  GUIDE 04/09/2023 [facility]   BREAST BIOPSY Left 04/17/2023   MM LT BREAST BX W LOC DEV 1ST LESION IMAGE BX SPEC STEREO GUIDE 04/17/2023 [facility]   BREAST EXCISIONAL BIOPSY Right 06/28/2020   papilloma   DILATION AND EVACUATION N/A 06/05/2017   Procedure: DILATATION AND EVACUATION;  Surgeon: Bettina Muskrat, MD;  Location: [facility];  Service: Gynecology;  Laterality: N/A;   KNEE ARTHROSCOPY WITH MENISCAL REPAIR Left 09/20/2021   Procedure: LEFT KNEE PARTIAL MEDIAL MENISCECTOMY;  Surgeon: Jerri Kay HERO, MD;  Location: [facility];  Service: Orthopedics;  Laterality: Left;   RADIOACTIVE SEED GUIDED EXCISIONAL BREAST BIOPSY Right 06/28/2020   Procedure: RIGHT RADIOACTIVE SEED GUIDED EXCISIONAL BREAST BIOPSY;  Surgeon: Ebbie Cough, MD;  Location: [facility];  Service: General;  Laterality: Right;   TOTAL KNEE  ARTHROPLASTY Left 09/04/2023   Procedure: LEFT TOTAL KNEE ARTHROPLASTY;  Surgeon: Jerri Kay HERO, MD;  Location: [facility];  Service: Orthopedics;  Laterality: Left;   WISDOM TOOTH EXTRACTION     Patient Active Problem List   Diagnosis Date Noted   Status post total left knee replacement 09/04/2023   Chronic migraine without aura without status migrainosus, not intractable 04/14/2021   Muscle spasm 06/20/2018   Unspecified dyspareunia (CODE) 03/20/2017   Amenorrhea 03/20/2017   Irritable bowel syndrome 01/01/2017   Status migrainosus 01/01/2017   Anxiety 01/01/2017   Vaginal discharge 12/17/2016   GERD (gastroesophageal reflux disease) 10/30/2016   Mixed hyperlipidemia 10/30/2016   Diabetes type 2, controlled (HCC) 09/12/2015   OSA (obstructive sleep apnea) 02/16/2015   Asthma 10/29/2011    PCP: Sophronia Ozell BROCKS, MD  REFERRING PROVIDER: Jerri Kay HERO, MD  REFERRING DIAG: [phone] (ICD-10-CM) - Status post total left knee replacement  Rationale for Evaluation and Treatment: Rehabilitation  THERAPY DIAG:  Stiffness of left knee, not elsewhere classified  Muscle weakness (generalized)  Localized edema  Acute pain of left knee  ONSET DATE: DOS: 09/04/23   SUBJECTIVE:  SUBJECTIVE STATEMENT: She says knee is feeling a little better  PERTINENT HISTORY:  DM, asthma, GERD, HLD, OSA, anxiety, migraines.  PAIN:  NPRS scale: 3/10 upon arrival Pain location:all over but worse in anterior medial knee Pain description: constant, achy, sharp Aggravating factors: sitting too long, car rides Relieving factors: ice,  meds   PRECAUTIONS:  None  RED FLAGS: None   WEIGHT BEARING RESTRICTIONS:  No  FALLS:  Has patient fallen in last 6 months? Yes. Number of falls 2.Feel back in bathrooom and  fell back on toilet  OCCUPATION:  Nurse  PLOF:  Independent  PATIENT GOALS:  Reduce pain, get back to normal function, get rid of walker   OBJECTIVE:   DIAGNOSTIC FINDINGS:  09/04/23 XR IMPRESSION: Left knee arthroplasty without immediate postoperative complication.  PATIENT SURVEYS:  09/30/23 FOTO 40% functional, goal is 64%  COGNITIVE STATUS: Within functional limits for tasks assessed   SENSATION: WFL  GAIT: 09/30/23 Ambulates in with RW independently, does not use AD PLOF for community ambulation   EDEMA: 09/30/23  Knee joint line: Moderate edema noted in left knee   LOWER EXTREMITY ROM:     ROM Left eval Left 10/02/22 Left 10/09/2023 Left 10/22/23 Left 10/28/23  Knee flexion A: 90 P: 99 P:105 106 110 A 120 P   Knee extension A: 6 P: 4 A:3 -2 -1 A 0   (Blank rows = not tested)   LOWER EXTREMITY MMT:    MMT Right eval Left eval  Knee flexion 5 4  Knee extension 5 4   (Blank rows = not tested)    TODAY'S TREATMENT:  10/29/2023 Therex: Recumbent bike seat #6 full circles X 10 min L2 Slantboard 30 sec X 3 Seated LAQ 5# 2X15 bilat  Manual: left knee PROM with overpressure flexion and extension, manual hamstring stretching  Functional Activities (sit to stand, stairs, curbs): Double Leg Press 81# 2 X15 x full extension (lock left knee out at the top) and stretch into flexion Single Leg Press 56# 2X15 x full extension (lock left knee out at the top) and stretch into flexion Step ups no UE support 8 inch step leading with left leg X 10 forward, X 10 lateral.  Step up leading with left leg and down in front leading with right leg 6 inch step X 10 with one UE support Sit to stands no UE support X 10 (left leg slightly further back for increased loading)  SLS 10 sec X 5 on left for single leg stability needed for ambulation March walking at counter top 3 trips Backward walking at counter top 3 trips  10/28/2023 Therex: Recumbent bike seat #6 full  circles X 10 min L2 Slantboard 30 sec X 3 Seated LAQ 5# 2X15 bilat  Manual: left knee PROM with overpressure flexion and extension, manual hamstring stretching  Functional Activities (sit to stand, stairs, curbs): Double Leg Press 75# 2 X10 x full extension (lock left knee out at the top) and stretch into flexion Single Leg Press 43# 2X10 x full extension (lock left knee out at the top) and stretch into flexion Step ups no UE support 6 inch step leading with left leg X 10 forward, X 10 lateral   10/24/2023 Therex: Scifit bike L3 X 10 min Seated LAQ 5# 2X10 bilat Shoe recommendations  Functional Activities (sit to stand, stairs, curbs): Double Leg Press 75# 2 X10  Single Leg Press 43# 2X10 x full extension  Sit to stands no UE support 2X10 Step ups  no UE support 8 inch step X 5, then 6 inch step leading with left leg X 10 forward, X 10 lateral   Vasopneumatic medium pressure 34 degrees left knee 10 minutes   PATIENT EDUCATION: Education details: HEP, PT plan of care Person educated: Patient Education method: Explanation, Demonstration, Verbal cues, and Handouts Education comprehension: verbalized understanding and needs further education   HOME EXERCISE PROGRAM: Access Code: [email or web address] URL: [email or web address] Date: 10/09/2023 Prepared by: Lamar Ivory  Exercises - Supine Heel Slide with Strap  - 2 x daily - 6 x weekly - 1-2 sets - 10 reps - 5 hold - Supine Knee Extension Stretch on Towel Roll  - 2 x daily - 6 x weekly - 1 sets - 10 reps - 5 sec hold - seated knee flexion stretch with knee extension strengthening  - 2 x daily - 6 x weekly - 1 sets - 15 reps - 5 sec hold - Seated Long Arc Quad (Mirrored)  - 2 x daily - 6 x weekly - 2 sets - 10 reps - Seated Straight Leg Raise with Quad Contraction  - 2 x daily - 6 x weekly - 2 sets - 10 reps - Sit to Stand with Armchair  - 2 x daily - 6 x weekly - 2 sets - 5 reps - alternating marching  - 2 x daily - 6 x  weekly - 1 sets - 10 reps - Supine Quadricep Sets  - 5 x daily - 7 x weekly - 2 sets - 10 reps - 5 second hold - Small Range Straight Leg Raise  - 2 x daily - 7 x weekly - 3-5 sets - 5 reps - 3 seconds hold  ASSESSMENT:  CLINICAL IMPRESSION: Her strength is improving and she had good tolerance to activity progressions today. She does lack some single leg stance stability so added some exercises to help with this today to try reduce the limp she is having.  OBJECTIVE IMPAIRMENTS: decreased activity tolerance, difficulty walking, decreased balance, decreased endurance, decreased functional mobility, decreased ROM, decreased strength, impaired flexibility, impaired LE use, and pain.  ACTIVITY LIMITATIONS: bending, lifting, carry, locomotion, cleaning, community activity, driving, and or occupation  PERSONAL FACTORS: see above PMH are also affecting patient's functional outcome.  REHAB POTENTIAL: Excellent  CLINICAL DECISION MAKING: Stable/uncomplicated  EVALUATION COMPLEXITY: Low    GOALS: Short term PT Goals Target date: 10/28/2023  Pt will be I and compliant with HEP. Baseline:  Goal status: MET 10/07/23 Pt will decrease pain by 25% overall Baseline: Goal status: ongoing 10/09/23  Long term PT goals Target date:11/25/2023   Pt will improve left ROM 0-120 deg to improve functional mobility Baseline: Goal status: ongoing 10/09/23 Pt will improve  hip/knee strength to at least 5-/5 MMT to improve functional strength Baseline: Goal status: ongoing 10/07/23 Pt will improve FOTO to at least 64% functional to show improved function Baseline: Goal status: ongoing 10/07/23 Pt will reduce pain to overall less than 2-3/10 with usual activity and work activity. Baseline: Goal status: ongoing 10/09/23 Pt will be able to ambulate community distances at least 1000 ft WNL gait pattern without complaints Baseline: Goal status: ongoing 10/09/23  PLAN: PT FREQUENCY: 1-3 times per week   PT  DURATION: 6-8 weeks  PLANNED INTERVENTIONS (unless contraindicated): aquatic PT, Canalith repositioning, cryotherapy, Electrical stimulation, Iontophoresis with 4 mg/ml dexamethasome, Moist heat, traction, Ultrasound, gait training, Therapeutic exercise, balance training, neuromuscular re-education, patient/family education,manual techniques, passive ROM, dry needling, taping, vasopnuematic device, vestibular, spinal manipulations,  joint manipulations 97110-Therapeutic exercises, 97530- Therapeutic activity, [id number]- Neuromuscular re-education, 97535- Self Care, and [id number]- Manual therapy  PLAN FOR NEXT SESSION: quadriceps strengthening and functional strenth and gait progressions to reduce limp as appropriate. Vaso if desired  Redell Moose, PT, DPT 10/29/23 2:26 PM

## 2023-10-30 ENCOUNTER — Other Ambulatory Visit: Payer: Self-pay | Admitting: Adult Health Nurse Practitioner

## 2023-10-30 DIAGNOSIS — R928 Other abnormal and inconclusive findings on diagnostic imaging of breast: Secondary | ICD-10-CM

## 2023-10-31 ENCOUNTER — Encounter: Payer: Commercial Managed Care - PPO | Admitting: Physical Therapy

## 2023-11-01 ENCOUNTER — Other Ambulatory Visit (HOSPITAL_COMMUNITY): Payer: Self-pay

## 2023-11-01 MED ORDER — PROMETHAZINE HCL 25 MG PO TABS
25.0000 mg | ORAL_TABLET | Freq: Four times a day (QID) | ORAL | 1 refills | Status: DC | PRN
  Filled 2023-11-01: qty 30, 8d supply, fill #0
  Filled 2024-01-01: qty 30, 8d supply, fill #1

## 2023-11-05 DIAGNOSIS — Z803 Family history of malignant neoplasm of breast: Secondary | ICD-10-CM | POA: Diagnosis not present

## 2023-11-05 DIAGNOSIS — N924 Excessive bleeding in the premenopausal period: Secondary | ICD-10-CM | POA: Diagnosis not present

## 2023-11-05 DIAGNOSIS — D251 Intramural leiomyoma of uterus: Secondary | ICD-10-CM | POA: Diagnosis not present

## 2023-11-06 ENCOUNTER — Ambulatory Visit: Payer: Commercial Managed Care - PPO | Admitting: Physical Therapy

## 2023-11-06 ENCOUNTER — Encounter: Payer: Commercial Managed Care - PPO | Admitting: Physical Therapy

## 2023-11-06 ENCOUNTER — Other Ambulatory Visit: Payer: Self-pay | Admitting: Physician Assistant

## 2023-11-06 ENCOUNTER — Encounter: Payer: Self-pay | Admitting: Physical Therapy

## 2023-11-06 ENCOUNTER — Other Ambulatory Visit (HOSPITAL_COMMUNITY): Payer: Self-pay

## 2023-11-06 DIAGNOSIS — G4733 Obstructive sleep apnea (adult) (pediatric): Secondary | ICD-10-CM | POA: Diagnosis not present

## 2023-11-06 DIAGNOSIS — M25662 Stiffness of left knee, not elsewhere classified: Secondary | ICD-10-CM

## 2023-11-06 DIAGNOSIS — R600 Localized edema: Secondary | ICD-10-CM | POA: Diagnosis not present

## 2023-11-06 DIAGNOSIS — M6281 Muscle weakness (generalized): Secondary | ICD-10-CM | POA: Diagnosis not present

## 2023-11-06 MED ORDER — HYDROCODONE-ACETAMINOPHEN 7.5-325 MG PO TABS
1.0000 | ORAL_TABLET | Freq: Two times a day (BID) | ORAL | 0 refills | Status: DC | PRN
  Filled 2023-11-06: qty 28, 7d supply, fill #0

## 2023-11-06 NOTE — Therapy (Signed)
OUTPATIENT PHYSICAL THERAPY TREATMENT   Patient Name: Julie Hansen MRN: [id number] DOB:June 06, 1971, 53 y.o., female Today's Date: 11/06/2023  END OF SESSION:  PT End of Session - 11/06/23 1349     Visit Number 13    Number of Visits 16    Date for PT Re-Evaluation 11/25/23    Authorization Type [employer] $25 copay    PT Start Time 1345    PT Stop Time 1435    PT Time Calculation (min) 50 min    Activity Tolerance Patient tolerated treatment well    Behavior During Therapy WFL for tasks assessed/performed                  Past Medical History:  Diagnosis Date   Acid reflux    Anxiety    Asthma    Diabetes mellitus without complication (HCC)    Headache    migraines   Hyperlipidemia    Hypertension    Osteoarthritis of left knee    Palpitations    PCOS (polycystic ovarian syndrome)    PONV (postoperative nausea and vomiting)    Sleep apnea    uses cpap   Past Surgical History:  Procedure Laterality Date   BREAST BIOPSY Left 04/09/2023   BREAST BIOPSY Left 04/09/2023   [place] LT BREAST BX W LOC DEV 1ST LESION IMG BX SPEC US GUIDE 04/09/2023 [facility]   BREAST BIOPSY Left 04/17/2023   MM LT BREAST BX W LOC DEV 1ST LESION IMAGE BX SPEC STEREO GUIDE 04/17/2023 [facility]   BREAST EXCISIONAL BIOPSY Right 06/28/2020   papilloma   DILATION AND EVACUATION N/A 06/05/2017   Procedure: DILATATION AND EVACUATION;  Surgeon: Essie Hart, MD;  Location: [facility];  Service: Gynecology;  Laterality: N/A;   KNEE ARTHROSCOPY WITH MENISCAL REPAIR Left 09/20/2021   Procedure: LEFT KNEE PARTIAL MEDIAL MENISCECTOMY;  Surgeon: Tarry Kos, MD;  Location: [facility];  Service: Orthopedics;  Laterality: Left;   RADIOACTIVE SEED GUIDED EXCISIONAL BREAST BIOPSY Right 06/28/2020   Procedure: RIGHT RADIOACTIVE SEED GUIDED EXCISIONAL BREAST BIOPSY;  Surgeon: Emelia Loron, MD;  Location: [facility];  Service: General;  Laterality: Right;    TOTAL KNEE ARTHROPLASTY Left 09/04/2023   Procedure: LEFT TOTAL KNEE ARTHROPLASTY;  Surgeon: Tarry Kos, MD;  Location: [facility];  Service: Orthopedics;  Laterality: Left;   WISDOM TOOTH EXTRACTION     Patient Active Problem List   Diagnosis Date Noted   Status post total left knee replacement 09/04/2023   Chronic migraine without aura without status migrainosus, not intractable 04/14/2021   Muscle spasm 06/20/2018   Unspecified dyspareunia (CODE) 03/20/2017   Amenorrhea 03/20/2017   Irritable bowel syndrome 01/01/2017   Status migrainosus 01/01/2017   Anxiety 01/01/2017   Vaginal discharge 12/17/2016   GERD (gastroesophageal reflux disease) 10/30/2016   Mixed hyperlipidemia 10/30/2016   Diabetes type 2, controlled (HCC) 09/12/2015   OSA (obstructive sleep apnea) 02/16/2015   Asthma 10/29/2011    PCP: Eartha Inch, MD  REFERRING PROVIDER: Tarry Kos, MD  REFERRING DIAG: [phone] (ICD-10-CM) - Status post total left knee replacement  Rationale for Evaluation and Treatment: Rehabilitation  THERAPY DIAG:  Stiffness of left knee, not elsewhere classified  Muscle weakness (generalized)  Localized edema  ONSET DATE: DOS: 09/04/23   SUBJECTIVE:  SUBJECTIVE STATEMENT: Doing okay, had a migraine earlier but is better now  PERTINENT HISTORY:  DM, asthma, GERD, HLD, OSA, anxiety, migraines.  PAIN:  NPRS scale: 3/10 upon arrival Pain location:all over but worse in anterior medial knee Pain description: constant, achy, sharp Aggravating factors: sitting too long, car rides Relieving factors: ice,  meds   PRECAUTIONS:  None  RED FLAGS: None   WEIGHT BEARING RESTRICTIONS:  No  FALLS:  Has patient fallen in last 6 months? Yes. Number of falls 2.Feel back in bathrooom and fell  back on toilet  OCCUPATION:  Nurse  PLOF:  Independent  PATIENT GOALS:  Reduce pain, get back to normal function, get rid of walker   OBJECTIVE:   DIAGNOSTIC FINDINGS:  09/04/23 XR IMPRESSION: Left knee arthroplasty without immediate postoperative complication.  PATIENT SURVEYS:  09/30/23 FOTO 40% functional, goal is 64%  COGNITIVE STATUS: Within functional limits for tasks assessed   SENSATION: WFL  GAIT: 09/30/23 Ambulates in with RW independently, does not use AD PLOF for community ambulation   EDEMA: 09/30/23  Knee joint line: Moderate edema noted in left knee   LOWER EXTREMITY ROM:     ROM Left eval Left 10/02/22 Left 10/09/2023 Left 10/22/23 Left 10/28/23  Knee flexion A: 90 P: 99 P:105 106 110 A 120 P   Knee extension A: 6 P: 4 A:3 -2 -1 A 0   (Blank rows = not tested)   LOWER EXTREMITY MMT:    MMT Right eval Left eval  Knee flexion 5 4  Knee extension 5 4   (Blank rows = not tested)    TODAY'S TREATMENT:  11/06/23 TherAct Double Leg Press 81# 2 X15 x full extension (lock left knee out at the top) and stretch into flexion Single Leg Press 56# 2X15 x full extension (lock left knee out at the top) and stretch into flexion Forward step ups onto 6" step x10 bil; no UE support Lateral step ups onto 6" step x10 bil; no UE support LLE on 4" step with Rt heel taps 2x10; UE support needed Sit to/from stand x 10 without UE support  TherEx Recumbent bike seat #6 full circles X 10 min L3 Seated LAQ 5# 2X15 bilat; 3 sec hold  Modalities Vaso x 10 min; Lt knee mod pressure in elevation    10/29/2023 Therex: Recumbent bike seat #6 full circles X 10 min L2 Slantboard 30 sec X 3 Seated LAQ 5# 2X15 bilat  Manual: left knee PROM with overpressure flexion and extension, manual hamstring stretching  Functional Activities (sit to stand, stairs, curbs): Double Leg Press 81# 2 X15 x full extension (lock left knee out at the top) and stretch into  flexion Single Leg Press 56# 2X15 x full extension (lock left knee out at the top) and stretch into flexion Step ups no UE support 8 inch step leading with left leg X 10 forward, X 10 lateral.  Step up leading with left leg and down in front leading with right leg 6 inch step X 10 with one UE support Sit to stands no UE support X 10 (left leg slightly further back for increased loading)  SLS 10 sec X 5 on left for single leg stability needed for ambulation March walking at counter top 3 trips Backward walking at counter top 3 trips  10/28/2023 Therex: Recumbent bike seat #6 full circles X 10 min L2 Slantboard 30 sec X 3 Seated LAQ 5# 2X15 bilat  Manual: left knee PROM with overpressure flexion  and extension, manual hamstring stretching  Functional Activities (sit to stand, stairs, curbs): Double Leg Press 75# 2 X10 x full extension (lock left knee out at the top) and stretch into flexion Single Leg Press 43# 2X10 x full extension (lock left knee out at the top) and stretch into flexion Step ups no UE support 6 inch step leading with left leg X 10 forward, X 10 lateral   10/24/2023 Therex: Scifit bike L3 X 10 min Seated LAQ 5# 2X10 bilat Shoe recommendations  Functional Activities (sit to stand, stairs, curbs): Double Leg Press 75# 2 X10  Single Leg Press 43# 2X10 x full extension  Sit to stands no UE support 2X10 Step ups no UE support 8 inch step X 5, then 6 inch step leading with left leg X 10 forward, X 10 lateral   Vasopneumatic medium pressure 34 degrees left knee 10 minutes   PATIENT EDUCATION: Education details: HEP, PT plan of care Person educated: Patient Education method: Explanation, Demonstration, Verbal cues, and Handouts Education comprehension: verbalized understanding and needs further education   HOME EXERCISE PROGRAM: Access Code: [email or web address] URL: [email or web address] Date: 10/09/2023 Prepared by: Pauletta Browns  Exercises - Supine Heel  Slide with Strap  - 2 x daily - 6 x weekly - 1-2 sets - 10 reps - 5 hold - Supine Knee Extension Stretch on Towel Roll  - 2 x daily - 6 x weekly - 1 sets - 10 reps - 5 sec hold - seated knee flexion stretch with knee extension strengthening  - 2 x daily - 6 x weekly - 1 sets - 15 reps - 5 sec hold - Seated Long Arc Quad (Mirrored)  - 2 x daily - 6 x weekly - 2 sets - 10 reps - Seated Straight Leg Raise with Quad Contraction  - 2 x daily - 6 x weekly - 2 sets - 10 reps - Sit to Stand with Armchair  - 2 x daily - 6 x weekly - 2 sets - 5 reps - alternating marching  - 2 x daily - 6 x weekly - 1 sets - 10 reps - Supine Quadricep Sets  - 5 x daily - 7 x weekly - 2 sets - 10 reps - 5 second hold - Small Range Straight Leg Raise  - 2 x daily - 7 x weekly - 3-5 sets - 5 reps - 3 seconds hold  ASSESSMENT:  CLINICAL IMPRESSION:  Pt tolerated session well today with continued focus on functional strength and balance.  Will continue to benefit from PT to maximize function.  OBJECTIVE IMPAIRMENTS: decreased activity tolerance, difficulty walking, decreased balance, decreased endurance, decreased functional mobility, decreased ROM, decreased strength, impaired flexibility, impaired LE use, and pain.  ACTIVITY LIMITATIONS: bending, lifting, carry, locomotion, cleaning, community activity, driving, and or occupation  PERSONAL FACTORS: see above PMH are also affecting patient's functional outcome.  REHAB POTENTIAL: Excellent  CLINICAL DECISION MAKING: Stable/uncomplicated  EVALUATION COMPLEXITY: Low    GOALS: Short term PT Goals Target date: 10/28/2023  Pt will be I and compliant with HEP. Baseline:  Goal status: MET 10/07/23 Pt will decrease pain by 25% overall Baseline: Goal status: ongoing 10/09/23  Long term PT goals Target date:11/25/2023   Pt will improve left ROM 0-120 deg to improve functional mobility Baseline: Goal status: ongoing 10/09/23 Pt will improve  hip/knee strength to at least  5-/5 MMT to improve functional strength Baseline: Goal status: ongoing 10/07/23 Pt will improve FOTO to at least 64% functional to  show improved function Baseline: Goal status: ongoing 10/07/23 Pt will reduce pain to overall less than 2-3/10 with usual activity and work activity. Baseline: Goal status: ongoing 10/09/23 Pt will be able to ambulate community distances at least 1000 ft WNL gait pattern without complaints Baseline: Goal status: ongoing 10/09/23  PLAN: PT FREQUENCY: 1-3 times per week   PT DURATION: 6-8 weeks  PLANNED INTERVENTIONS (unless contraindicated): aquatic PT, Canalith repositioning, cryotherapy, Electrical stimulation, Iontophoresis with 4 mg/ml dexamethasome, Moist heat, traction, Ultrasound, gait training, Therapeutic exercise, balance training, neuromuscular re-education, patient/family education,manual techniques, passive ROM, dry needling, taping, vasopnuematic device, vestibular, spinal manipulations, joint manipulations 97110-Therapeutic exercises, 97530- Therapeutic activity, 97112- Neuromuscular re-education, 97535- Self Care, and [id number]- Manual therapy  PLAN FOR NEXT SESSION: continue quadriceps strengthening and functional strenth and gait progressions to reduce limp as appropriate. Vaso if desired  Clarita Crane, PT, DPT 11/06/23 2:40 PM

## 2023-11-07 ENCOUNTER — Ambulatory Visit: Payer: Commercial Managed Care - PPO | Admitting: Physical Therapy

## 2023-11-07 ENCOUNTER — Other Ambulatory Visit (HOSPITAL_COMMUNITY): Payer: Self-pay

## 2023-11-07 ENCOUNTER — Encounter: Payer: Self-pay | Admitting: Physical Therapy

## 2023-11-07 ENCOUNTER — Encounter: Payer: Commercial Managed Care - PPO | Admitting: Rehabilitative and Restorative Service Providers"

## 2023-11-07 ENCOUNTER — Other Ambulatory Visit: Payer: Self-pay | Admitting: Physician Assistant

## 2023-11-07 DIAGNOSIS — R600 Localized edema: Secondary | ICD-10-CM | POA: Diagnosis not present

## 2023-11-07 DIAGNOSIS — M6281 Muscle weakness (generalized): Secondary | ICD-10-CM

## 2023-11-07 DIAGNOSIS — G8929 Other chronic pain: Secondary | ICD-10-CM | POA: Diagnosis not present

## 2023-11-07 DIAGNOSIS — M25562 Pain in left knee: Secondary | ICD-10-CM | POA: Diagnosis not present

## 2023-11-07 DIAGNOSIS — M25662 Stiffness of left knee, not elsewhere classified: Secondary | ICD-10-CM | POA: Diagnosis not present

## 2023-11-07 MED ORDER — GABAPENTIN 400 MG PO CAPS
400.0000 mg | ORAL_CAPSULE | Freq: Three times a day (TID) | ORAL | 2 refills | Status: DC
  Filled 2023-11-07 – 2024-02-29 (×3): qty 270, 90d supply, fill #0
  Filled 2024-06-05: qty 270, 90d supply, fill #1

## 2023-11-07 MED ORDER — IBUPROFEN 800 MG PO TABS
800.0000 mg | ORAL_TABLET | Freq: Three times a day (TID) | ORAL | 1 refills | Status: DC | PRN
  Filled 2023-11-07 – 2023-11-12 (×2): qty 60, 20d supply, fill #0
  Filled 2024-01-01: qty 60, 20d supply, fill #1

## 2023-11-07 NOTE — Therapy (Signed)
OUTPATIENT PHYSICAL THERAPY TREATMENT   Patient Name: Julie Hansen MRN: [id number] DOB:[birth year], 53 y.o., female Today's Date: 11/07/2023  END OF SESSION:  PT End of Session - 11/07/23 0816     Visit Number 14    Number of Visits 16    Date for PT Re-Evaluation 11/25/23    Authorization Type [employer] $25 copay    PT Start Time 0800    PT Stop Time 0848    PT Time Calculation (min) 48 min    Activity Tolerance Patient tolerated treatment well    Behavior During Therapy WFL for tasks assessed/performed                   Past Medical History:  Diagnosis Date   Acid reflux    Anxiety    Asthma    Diabetes mellitus without complication (HCC)    Headache    migraines   Hyperlipidemia    Hypertension    Osteoarthritis of left knee    Palpitations    PCOS (polycystic ovarian syndrome)    PONV (postoperative nausea and vomiting)    Sleep apnea    uses cpap   Past Surgical History:  Procedure Laterality Date   BREAST BIOPSY Left 04/09/2023   BREAST BIOPSY Left 04/09/2023   [place] LT BREAST BX W LOC DEV 1ST LESION IMG BX SPEC US GUIDE 04/09/2023 [facility]   BREAST BIOPSY Left 04/17/2023   MM LT BREAST BX W LOC DEV 1ST LESION IMAGE BX SPEC STEREO GUIDE 04/17/2023 [facility]   BREAST EXCISIONAL BIOPSY Right 06/28/2020   papilloma   DILATION AND EVACUATION N/A 06/05/2017   Procedure: DILATATION AND EVACUATION;  Surgeon: Essie Hart, MD;  Location: [facility];  Service: Gynecology;  Laterality: N/A;   KNEE ARTHROSCOPY WITH MENISCAL REPAIR Left 09/20/2021   Procedure: LEFT KNEE PARTIAL MEDIAL MENISCECTOMY;  Surgeon: Tarry Kos, MD;  Location: [facility];  Service: Orthopedics;  Laterality: Left;   RADIOACTIVE SEED GUIDED EXCISIONAL BREAST BIOPSY Right 06/28/2020   Procedure: RIGHT RADIOACTIVE SEED GUIDED EXCISIONAL BREAST BIOPSY;  Surgeon: Emelia Loron, MD;  Location: [facility];  Service: General;  Laterality:  Right;   TOTAL KNEE ARTHROPLASTY Left 09/04/2023   Procedure: LEFT TOTAL KNEE ARTHROPLASTY;  Surgeon: Tarry Kos, MD;  Location: [facility];  Service: Orthopedics;  Laterality: Left;   WISDOM TOOTH EXTRACTION     Patient Active Problem List   Diagnosis Date Noted   Status post total left knee replacement 09/04/2023   Chronic migraine without aura without status migrainosus, not intractable 04/14/2021   Muscle spasm 06/20/2018   Unspecified dyspareunia (CODE) 03/20/2017   Amenorrhea 03/20/2017   Irritable bowel syndrome 01/01/2017   Status migrainosus 01/01/2017   Anxiety 01/01/2017   Vaginal discharge 12/17/2016   GERD (gastroesophageal reflux disease) 10/30/2016   Mixed hyperlipidemia 10/30/2016   Diabetes type 2, controlled (HCC) 09/12/2015   OSA (obstructive sleep apnea) 02/16/2015   Asthma 10/29/2011    PCP: Eartha Inch, MD  REFERRING PROVIDER: Tarry Kos, MD  REFERRING DIAG: [phone] (ICD-10-CM) - Status post total left knee replacement  Rationale for Evaluation and Treatment: Rehabilitation  THERAPY DIAG:  Stiffness of left knee, not elsewhere classified  Muscle weakness (generalized)  Localized edema  Acute pain of left knee  Chronic pain of left knee  ONSET DATE: DOS: 09/04/23   SUBJECTIVE:  SUBJECTIVE STATEMENT:denies pain today, says her knee feels pretty good  PERTINENT HISTORY:  DM, asthma, GERD, HLD, OSA, anxiety, migraines.  PAIN:  NPRS scale: 0/10 upon arrival Pain location:all over but worse in anterior medial knee Pain description: constant, achy, sharp Aggravating factors: sitting too long, car rides Relieving factors: ice,  meds   PRECAUTIONS:  None  RED FLAGS: None   WEIGHT BEARING RESTRICTIONS:  No  FALLS:  Has patient fallen in last 6  months? Yes. Number of falls 2.Feel back in bathrooom and fell back on toilet  OCCUPATION:  Nurse  PLOF:  Independent  PATIENT GOALS:  Reduce pain, get back to normal function, get rid of walker   OBJECTIVE:   DIAGNOSTIC FINDINGS:  09/04/23 XR IMPRESSION: Left knee arthroplasty without immediate postoperative complication.  PATIENT SURVEYS:  09/30/23 FOTO 40% functional, goal is 64%  COGNITIVE STATUS: Within functional limits for tasks assessed   SENSATION: WFL  GAIT: 09/30/23 Ambulates in with RW independently, does not use AD PLOF for community ambulation   EDEMA: 09/30/23  Knee joint line: Moderate edema noted in left knee   LOWER EXTREMITY ROM:     ROM Left eval Left 10/02/22 Left 10/09/2023 Left 10/22/23 Left 10/28/23  Knee flexion A: 90 P: 99 P:105 106 110 A 120 P   Knee extension A: 6 P: 4 A:3 -2 -1 A 0   (Blank rows = not tested)   LOWER EXTREMITY MMT:    MMT Right eval Left eval Left 11/07/23  Knee flexion 5 4 5   Knee extension 5 4 4+   (Blank rows = not tested)    TODAY'S TREATMENT:  11/07/23 TherAct Double Leg Press 87# 2 X15 x full extension (lock left knee out at the top) and stretch into flexion Single Leg Press 62# 2X15 x full extension (lock left knee out at the top) and stretch into flexion Forward step ups onto 6" step x12; no UE support Lateral step ups onto 6" step x12 bil; no UE support LLE on 4" step with Rt heel taps 2x10; UE support needed Sit to/from stand x 12 without UE support March walking 4 round trips in bars without UE support for single leg stance stability in gait to reduce limp Single leg sliders front, lateral , backwards X 10 each for for single leg stance stability in gait to reduce limp  TherEx Recumbent bike seat #6 full circles X 10 min L3 Seated LAQ 7.5# 3X10  Modalities Vaso x 10 min; Lt knee mod pressure in elevation  11/06/23 TherAct Double Leg Press 81# 2 X15 x full extension (lock left knee out at  the top) and stretch into flexion Single Leg Press 56# 2X15 x full extension (lock left knee out at the top) and stretch into flexion Forward step ups onto 6" step x10 bil; no UE support Lateral step ups onto 6" step x10 bil; no UE support LLE on 4" step with Rt heel taps 2x10; UE support needed Sit to/from stand x 10 without UE support  TherEx Recumbent bike seat #6 full circles X 10 min L3 Seated LAQ 5# 2X15 bilat; 3 sec hold  Modalities Vaso x 10 min; Lt knee mod pressure in elevation    PATIENT EDUCATION: Education details: HEP, PT plan of care Person educated: Patient Education method: Explanation, Demonstration, Verbal cues, and Handouts Education comprehension: verbalized understanding and needs further education   HOME EXERCISE PROGRAM: Access Code: [email or web address] URL: [email or web address] Date: 10/09/2023 Prepared by: Pauletta Browns  Exercises -  Supine Heel Slide with Strap  - 2 x daily - 6 x weekly - 1-2 sets - 10 reps - 5 hold - Supine Knee Extension Stretch on Towel Roll  - 2 x daily - 6 x weekly - 1 sets - 10 reps - 5 sec hold - seated knee flexion stretch with knee extension strengthening  - 2 x daily - 6 x weekly - 1 sets - 15 reps - 5 sec hold - Seated Long Arc Quad (Mirrored)  - 2 x daily - 6 x weekly - 2 sets - 10 reps - Seated Straight Leg Raise with Quad Contraction  - 2 x daily - 6 x weekly - 2 sets - 10 reps - Sit to Stand with Armchair  - 2 x daily - 6 x weekly - 2 sets - 5 reps - alternating marching  - 2 x daily - 6 x weekly - 1 sets - 10 reps - Supine Quadricep Sets  - 5 x daily - 7 x weekly - 2 sets - 10 reps - 5 second hold - Small Range Straight Leg Raise  - 2 x daily - 7 x weekly - 3-5 sets - 5 reps - 3 seconds hold  ASSESSMENT:  CLINICAL IMPRESSION:  She had good response to strength progressions today, does still have mild limp (not due to pain) and quad weakness so added in more work with this to improve single leg stance stability  in gait in efforts to reduce limp.   OBJECTIVE IMPAIRMENTS: decreased activity tolerance, difficulty walking, decreased balance, decreased endurance, decreased functional mobility, decreased ROM, decreased strength, impaired flexibility, impaired LE use, and pain.  ACTIVITY LIMITATIONS: bending, lifting, carry, locomotion, cleaning, community activity, driving, and or occupation  PERSONAL FACTORS: see above PMH are also affecting patient's functional outcome.  REHAB POTENTIAL: Excellent  CLINICAL DECISION MAKING: Stable/uncomplicated  EVALUATION COMPLEXITY: Low    GOALS: Short term PT Goals Target date: 10/28/2023  Pt will be I and compliant with HEP. Baseline:  Goal status: MET 10/07/23 Pt will decrease pain by 25% overall Baseline: Goal status: ongoing 10/09/23  Long term PT goals Target date:11/25/2023   Pt will improve left ROM 0-120 deg to improve functional mobility Baseline: Goal status: ongoing 10/09/23 Pt will improve  hip/knee strength to at least 5-/5 MMT to improve functional strength Baseline: Goal status: ongoing 10/07/23 Pt will improve FOTO to at least 64% functional to show improved function Baseline: Goal status: ongoing 10/07/23 Pt will reduce pain to overall less than 2-3/10 with usual activity and work activity. Baseline: Goal status: ongoing 10/09/23 Pt will be able to ambulate community distances at least 1000 ft WNL gait pattern without complaints Baseline: Goal status: ongoing 10/09/23  PLAN: PT FREQUENCY: 1-3 times per week   PT DURATION: 6-8 weeks  PLANNED INTERVENTIONS (unless contraindicated): aquatic PT, Canalith repositioning, cryotherapy, Electrical stimulation, Iontophoresis with 4 mg/ml dexamethasome, Moist heat, traction, Ultrasound, gait training, Therapeutic exercise, balance training, neuromuscular re-education, patient/family education,manual techniques, passive ROM, dry needling, taping, vasopnuematic device, vestibular, spinal  manipulations, joint manipulations 97110-Therapeutic exercises, 97530- Therapeutic activity, 97112- Neuromuscular re-education, 97535- Self Care, and [id number]- Manual therapy  PLAN FOR NEXT SESSION: continue quadriceps strengthening and functional strenth, single leg stability with gait progressions to reduce limp as appropriate. Vaso if desired  Ivery Quale, PT, DPT 11/07/23 8:37 AM

## 2023-11-07 NOTE — Progress Notes (Signed)
Pt requesting refills on Gabapentin, Ibuprofen, Lasmiditan.   Last seen 11/24 for botox injections.  Will refill meds as requested.  KTC

## 2023-11-08 ENCOUNTER — Other Ambulatory Visit: Payer: Self-pay | Admitting: Physician Assistant

## 2023-11-08 ENCOUNTER — Encounter: Payer: Commercial Managed Care - PPO | Admitting: Physical Therapy

## 2023-11-08 MED ORDER — REYVOW 100 MG PO TABS: 200.0000 mg | ORAL_TABLET | ORAL | 2 refills | Status: DC | PRN

## 2023-11-08 NOTE — Progress Notes (Signed)
Unable to send [facility] electronically.  Rx printed.

## 2023-11-11 ENCOUNTER — Ambulatory Visit
Admission: RE | Admit: 2023-11-11 | Discharge: 2023-11-11 | Disposition: A | Discharge status: Home / Self Care | Payer: Commercial Managed Care - PPO | Source: Ambulatory Visit | Attending: Adult Health Nurse Practitioner

## 2023-11-11 ENCOUNTER — Ambulatory Visit
Admission: RE | Admit: 2023-11-11 | Discharge: 2023-11-11 | Disposition: A | Discharge status: Home / Self Care | Payer: Commercial Managed Care - PPO | Source: Ambulatory Visit | Attending: Adult Health Nurse Practitioner | Admitting: Adult Health Nurse Practitioner

## 2023-11-11 DIAGNOSIS — D242 Benign neoplasm of left breast: Secondary | ICD-10-CM | POA: Diagnosis not present

## 2023-11-11 DIAGNOSIS — R928 Other abnormal and inconclusive findings on diagnostic imaging of breast: Secondary | ICD-10-CM

## 2023-11-11 MED ORDER — GADOPICLENOL 0.5 MMOL/ML IV SOLN
10.0000 mL | Freq: Once | INTRAVENOUS | Status: AC | PRN
  Administered 2023-11-11: 10 mL via INTRAVENOUS

## 2023-11-12 ENCOUNTER — Ambulatory Visit: Payer: Commercial Managed Care - PPO | Admitting: Physical Therapy

## 2023-11-12 ENCOUNTER — Other Ambulatory Visit (HOSPITAL_COMMUNITY): Payer: Self-pay

## 2023-11-12 ENCOUNTER — Other Ambulatory Visit: Payer: Self-pay

## 2023-11-12 ENCOUNTER — Encounter: Payer: Self-pay | Admitting: Physical Therapy

## 2023-11-12 DIAGNOSIS — M25662 Stiffness of left knee, not elsewhere classified: Secondary | ICD-10-CM | POA: Diagnosis not present

## 2023-11-12 DIAGNOSIS — R600 Localized edema: Secondary | ICD-10-CM

## 2023-11-12 DIAGNOSIS — M25562 Pain in left knee: Secondary | ICD-10-CM | POA: Diagnosis not present

## 2023-11-12 DIAGNOSIS — G4733 Obstructive sleep apnea (adult) (pediatric): Secondary | ICD-10-CM | POA: Diagnosis not present

## 2023-11-12 DIAGNOSIS — M6281 Muscle weakness (generalized): Secondary | ICD-10-CM | POA: Diagnosis not present

## 2023-11-12 LAB — SURGICAL PATHOLOGY

## 2023-11-12 NOTE — Therapy (Signed)
OUTPATIENT PHYSICAL THERAPY TREATMENT   Patient Name: Julie Hansen MRN: [id number] DOB:Jan 07, 1971, 53 y.o., female Today's Date: 11/12/2023  END OF SESSION:  PT End of Session - 11/12/23 0852     Visit Number 15    Number of Visits 16    Date for PT Re-Evaluation 11/25/23    Authorization Type [employer] $25 copay    PT Start Time 0845    PT Stop Time 0928    PT Time Calculation (min) 43 min    Activity Tolerance Patient tolerated treatment well    Behavior During Therapy WFL for tasks assessed/performed                    Past Medical History:  Diagnosis Date   Acid reflux    Anxiety    Asthma    Diabetes mellitus without complication (HCC)    Headache    migraines   Hyperlipidemia    Hypertension    Osteoarthritis of left knee    Palpitations    PCOS (polycystic ovarian syndrome)    PONV (postoperative nausea and vomiting)    Sleep apnea    uses cpap   Past Surgical History:  Procedure Laterality Date   BREAST BIOPSY Left 04/09/2023   BREAST BIOPSY Left 04/09/2023   [place] LT BREAST BX W LOC DEV 1ST LESION IMG BX SPEC US GUIDE 04/09/2023 [facility]   BREAST BIOPSY Left 04/17/2023   MM LT BREAST BX W LOC DEV 1ST LESION IMAGE BX SPEC STEREO GUIDE 04/17/2023 [facility]   BREAST EXCISIONAL BIOPSY Right 06/28/2020   papilloma   DILATION AND EVACUATION N/A 06/05/2017   Procedure: DILATATION AND EVACUATION;  Surgeon: Essie Hart, MD;  Location: [facility];  Service: Gynecology;  Laterality: N/A;   KNEE ARTHROSCOPY WITH MENISCAL REPAIR Left 09/20/2021   Procedure: LEFT KNEE PARTIAL MEDIAL MENISCECTOMY;  Surgeon: Tarry Kos, MD;  Location: [facility];  Service: Orthopedics;  Laterality: Left;   RADIOACTIVE SEED GUIDED EXCISIONAL BREAST BIOPSY Right 06/28/2020   Procedure: RIGHT RADIOACTIVE SEED GUIDED EXCISIONAL BREAST BIOPSY;  Surgeon: Emelia Loron, MD;  Location: [facility];  Service: General;  Laterality:  Right;   TOTAL KNEE ARTHROPLASTY Left 09/04/2023   Procedure: LEFT TOTAL KNEE ARTHROPLASTY;  Surgeon: Tarry Kos, MD;  Location: [facility];  Service: Orthopedics;  Laterality: Left;   WISDOM TOOTH EXTRACTION     Patient Active Problem List   Diagnosis Date Noted   Status post total left knee replacement 09/04/2023   Chronic migraine without aura without status migrainosus, not intractable 04/14/2021   Muscle spasm 06/20/2018   Unspecified dyspareunia (CODE) 03/20/2017   Amenorrhea 03/20/2017   Irritable bowel syndrome 01/01/2017   Status migrainosus 01/01/2017   Anxiety 01/01/2017   Vaginal discharge 12/17/2016   GERD (gastroesophageal reflux disease) 10/30/2016   Mixed hyperlipidemia 10/30/2016   Diabetes type 2, controlled (HCC) 09/12/2015   OSA (obstructive sleep apnea) 02/16/2015   Asthma 10/29/2011    PCP: Eartha Inch, MD  REFERRING PROVIDER: Tarry Kos, MD  REFERRING DIAG: [phone] (ICD-10-CM) - Status post total left knee replacement  Rationale for Evaluation and Treatment: Rehabilitation  THERAPY DIAG:  Stiffness of left knee, not elsewhere classified  Muscle weakness (generalized)  Localized edema  Acute pain of left knee  ONSET DATE: DOS: 09/04/23   SUBJECTIVE:  SUBJECTIVE STATEMENT: doing well; more sore today from increased activity over the weekend.  PERTINENT HISTORY:  DM, asthma, GERD, HLD, OSA, anxiety, migraines.  PAIN:  NPRS scale: 0/10 upon arrival Pain location:all over but worse in anterior medial knee Pain description: constant, achy, sharp Aggravating factors: sitting too long, car rides Relieving factors: ice,  meds   PRECAUTIONS:  None  RED FLAGS: None   WEIGHT BEARING RESTRICTIONS:  No  FALLS:  Has patient fallen in last 6 months? Yes.  Number of falls 2.Feel back in bathrooom and fell back on toilet  OCCUPATION:  Nurse  PLOF:  Independent  PATIENT GOALS:  Reduce pain, get back to normal function, get rid of walker   OBJECTIVE:   DIAGNOSTIC FINDINGS:  09/04/23 XR IMPRESSION: Left knee arthroplasty without immediate postoperative complication.  PATIENT SURVEYS:  09/30/23 FOTO 40% functional, goal is 64%  COGNITIVE STATUS: Within functional limits for tasks assessed   SENSATION: WFL  GAIT: 09/30/23 Ambulates in with RW independently, does not use AD PLOF for community ambulation   EDEMA: 09/30/23  Knee joint line: Moderate edema noted in left knee   LOWER EXTREMITY ROM:     ROM Left eval Left 10/02/22 Left 10/09/2023 Left 10/22/23 Left 10/28/23  Knee flexion A: 90 P: 99 P:105 106 110 A 120 P   Knee extension A: 6 P: 4 A:3 -2 -1 A 0   (Blank rows = not tested)   LOWER EXTREMITY MMT:    MMT Right eval Left eval Left 11/07/23  Knee flexion 5 4 5   Knee extension 5 4 4+   (Blank rows = not tested)    TODAY'S TREATMENT:  11/12/23 TherEx Recumbent bike seat #3 full circles X 10 min L4  TherAct Double Leg Press 100# 2 X15 x full extension (lock left knee out at the top) and stretch into flexion Single Leg Press bil 62# 2X15 x full extension (lock left knee out at the top) and stretch into flexion TRX squats 3x10; cues for increased LLE awareness LLE RDL with 10# KB 3x10  Neuro Re-ed Rockerboard ant/post and laterally with intermittent UE support: Weight shifting Horizontal and vertical head turns x 10 reps each direction Squats on BOSU 2x10; intermittent UE support needed Single leg step ups onto BOSU with 3 sec SLS x10 bil; occasional UE support needed  11/07/23 TherAct Double Leg Press 87# 2 X15 x full extension (lock left knee out at the top) and stretch into flexion Single Leg Press 62# 2X15 x full extension (lock left knee out at the top) and stretch into flexion Forward step ups  onto 6" step x12; no UE support Lateral step ups onto 6" step x12 bil; no UE support LLE on 4" step with Rt heel taps 2x10; UE support needed Sit to/from stand x 12 without UE support March walking 4 round trips in bars without UE support for single leg stance stability in gait to reduce limp Single leg sliders front, lateral , backwards X 10 each for for single leg stance stability in gait to reduce limp  TherEx Recumbent bike seat #6 full circles X 10 min L3 Seated LAQ 7.5# 3X10  Modalities Vaso x 10 min; Lt knee mod pressure in elevation  11/06/23 TherAct Double Leg Press 81# 2 X15 x full extension (lock left knee out at the top) and stretch into flexion Single Leg Press 56# 2X15 x full extension (lock left knee out at the top) and stretch into flexion Forward step ups onto  6" step x10 bil; no UE support Lateral step ups onto 6" step x10 bil; no UE support LLE on 4" step with Rt heel taps 2x10; UE support needed Sit to/from stand x 10 without UE support  TherEx Recumbent bike seat #6 full circles X 10 min L3 Seated LAQ 5# 2X15 bilat; 3 sec hold  Modalities Vaso x 10 min; Lt knee mod pressure in elevation    PATIENT EDUCATION: Education details: HEP, PT plan of care Person educated: Patient Education method: Explanation, Demonstration, Verbal cues, and Handouts Education comprehension: verbalized understanding and needs further education   HOME EXERCISE PROGRAM: Access Code: [email or web address] URL: [email or web address] Date: 10/09/2023 Prepared by: Pauletta Browns  Exercises - Supine Heel Slide with Strap  - 2 x daily - 6 x weekly - 1-2 sets - 10 reps - 5 hold - Supine Knee Extension Stretch on Towel Roll  - 2 x daily - 6 x weekly - 1 sets - 10 reps - 5 sec hold - seated knee flexion stretch with knee extension strengthening  - 2 x daily - 6 x weekly - 1 sets - 15 reps - 5 sec hold - Seated Long Arc Quad (Mirrored)  - 2 x daily - 6 x weekly - 2 sets - 10 reps -  Seated Straight Leg Raise with Quad Contraction  - 2 x daily - 6 x weekly - 2 sets - 10 reps - Sit to Stand with Armchair  - 2 x daily - 6 x weekly - 2 sets - 5 reps - alternating marching  - 2 x daily - 6 x weekly - 1 sets - 10 reps - Supine Quadricep Sets  - 5 x daily - 7 x weekly - 2 sets - 10 reps - 5 second hold - Small Range Straight Leg Raise  - 2 x daily - 7 x weekly - 3-5 sets - 5 reps - 3 seconds hold  ASSESSMENT:  CLINICAL IMPRESSION:  Pt tolerated session well progressing balance activities today needing mod cues and UE support.  Overall progressing well with PT.   OBJECTIVE IMPAIRMENTS: decreased activity tolerance, difficulty walking, decreased balance, decreased endurance, decreased functional mobility, decreased ROM, decreased strength, impaired flexibility, impaired LE use, and pain.  ACTIVITY LIMITATIONS: bending, lifting, carry, locomotion, cleaning, community activity, driving, and or occupation  PERSONAL FACTORS: see above PMH are also affecting patient's functional outcome.  REHAB POTENTIAL: Excellent  CLINICAL DECISION MAKING: Stable/uncomplicated  EVALUATION COMPLEXITY: Low    GOALS: Short term PT Goals Target date: 10/28/2023  Pt will be I and compliant with HEP. Baseline:  Goal status: MET 10/07/23 Pt will decrease pain by 25% overall Baseline: Goal status: ongoing 10/09/23  Long term PT goals Target date:11/25/2023   Pt will improve left ROM 0-120 deg to improve functional mobility Baseline: Goal status: ongoing 10/09/23 Pt will improve  hip/knee strength to at least 5-/5 MMT to improve functional strength Baseline: Goal status: ongoing 10/07/23 Pt will improve FOTO to at least 64% functional to show improved function Baseline: Goal status: ongoing 10/07/23 Pt will reduce pain to overall less than 2-3/10 with usual activity and work activity. Baseline: Goal status: ongoing 10/09/23 Pt will be able to ambulate community distances at least 1000 ft WNL  gait pattern without complaints Baseline: Goal status: ongoing 10/09/23  PLAN: PT FREQUENCY: 1-3 times per week   PT DURATION: 6-8 weeks  PLANNED INTERVENTIONS (unless contraindicated): aquatic PT, Canalith repositioning, cryotherapy, Electrical stimulation, Iontophoresis with 4 mg/ml dexamethasome, Moist heat, traction,  Ultrasound, gait training, Therapeutic exercise, balance training, neuromuscular re-education, patient/family education,manual techniques, passive ROM, dry needling, taping, vasopnuematic device, vestibular, spinal manipulations, joint manipulations 97110-Therapeutic exercises, 97530- Therapeutic activity, 97112- Neuromuscular re-education, 97535- Self Care, and [id number]- Manual therapy  PLAN FOR NEXT SESSION: balance and continue quadriceps strengthening and functional strenth, single leg stability with gait progressions to reduce limp as appropriate. Vaso if desired   NEXT MD APPT: not scheduled; per note needs one around 11/25/23  Clarita Crane, PT, DPT 11/12/23 9:30 AM

## 2023-11-13 ENCOUNTER — Other Ambulatory Visit (HOSPITAL_COMMUNITY): Payer: Self-pay

## 2023-11-14 ENCOUNTER — Encounter: Payer: Commercial Managed Care - PPO | Admitting: Rehabilitative and Restorative Service Providers"

## 2023-11-18 ENCOUNTER — Encounter: Payer: Self-pay | Admitting: Physical Therapy

## 2023-11-18 ENCOUNTER — Ambulatory Visit: Payer: Commercial Managed Care - PPO | Admitting: Physical Therapy

## 2023-11-18 ENCOUNTER — Encounter: Payer: Commercial Managed Care - PPO | Admitting: Physical Therapy

## 2023-11-18 ENCOUNTER — Other Ambulatory Visit: Payer: Self-pay | Admitting: Physician Assistant

## 2023-11-18 DIAGNOSIS — M25562 Pain in left knee: Secondary | ICD-10-CM

## 2023-11-18 DIAGNOSIS — R600 Localized edema: Secondary | ICD-10-CM

## 2023-11-18 DIAGNOSIS — M6281 Muscle weakness (generalized): Secondary | ICD-10-CM

## 2023-11-18 DIAGNOSIS — G8929 Other chronic pain: Secondary | ICD-10-CM | POA: Diagnosis not present

## 2023-11-18 DIAGNOSIS — M25662 Stiffness of left knee, not elsewhere classified: Secondary | ICD-10-CM | POA: Diagnosis not present

## 2023-11-18 NOTE — Therapy (Signed)
 OUTPATIENT PHYSICAL THERAPY TREATMENT   Patient Name: Julie Hansen MRN: [id number] DOB:11-16-1970, 53 y.o., female Today's Date: 11/18/2023  END OF SESSION:  PT End of Session - 11/18/23 1343     Visit Number 16    Number of Visits 16    Date for PT Re-Evaluation 11/25/23    Authorization Type [employer] $25 copay    PT Start Time 1300    PT Stop Time 1343    PT Time Calculation (min) 43 min    Activity Tolerance Patient tolerated treatment well    Behavior During Therapy WFL for tasks assessed/performed                     Past Medical History:  Diagnosis Date   Acid reflux    Anxiety    Asthma    Diabetes mellitus without complication (HCC)    Headache    migraines   Hyperlipidemia    Hypertension    Osteoarthritis of left knee    Palpitations    PCOS (polycystic ovarian syndrome)    PONV (postoperative nausea and vomiting)    Sleep apnea    uses cpap   Past Surgical History:  Procedure Laterality Date   BREAST BIOPSY Left 04/09/2023   BREAST BIOPSY Left 04/09/2023   [place] LT BREAST BX W LOC DEV 1ST LESION IMG BX SPEC US GUIDE 04/09/2023 [facility]   BREAST BIOPSY Left 04/17/2023   MM LT BREAST BX W LOC DEV 1ST LESION IMAGE BX SPEC STEREO GUIDE 04/17/2023 [facility]   BREAST EXCISIONAL BIOPSY Right 06/28/2020   papilloma   DILATION AND EVACUATION N/A 06/05/2017   Procedure: DILATATION AND EVACUATION;  Surgeon: Essie Hart, MD;  Location: [facility];  Service: Gynecology;  Laterality: N/A;   KNEE ARTHROSCOPY WITH MENISCAL REPAIR Left 09/20/2021   Procedure: LEFT KNEE PARTIAL MEDIAL MENISCECTOMY;  Surgeon: Tarry Kos, MD;  Location: [facility];  Service: Orthopedics;  Laterality: Left;   RADIOACTIVE SEED GUIDED EXCISIONAL BREAST BIOPSY Right 06/28/2020   Procedure: RIGHT RADIOACTIVE SEED GUIDED EXCISIONAL BREAST BIOPSY;  Surgeon: Emelia Loron, MD;  Location: [facility];  Service: General;  Laterality:  Right;   TOTAL KNEE ARTHROPLASTY Left 09/04/2023   Procedure: LEFT TOTAL KNEE ARTHROPLASTY;  Surgeon: Tarry Kos, MD;  Location: [facility];  Service: Orthopedics;  Laterality: Left;   WISDOM TOOTH EXTRACTION     Patient Active Problem List   Diagnosis Date Noted   Status post total left knee replacement 09/04/2023   Chronic migraine without aura without status migrainosus, not intractable 04/14/2021   Muscle spasm 06/20/2018   Unspecified dyspareunia (CODE) 03/20/2017   Amenorrhea 03/20/2017   Irritable bowel syndrome 01/01/2017   Status migrainosus 01/01/2017   Anxiety 01/01/2017   Vaginal discharge 12/17/2016   GERD (gastroesophageal reflux disease) 10/30/2016   Mixed hyperlipidemia 10/30/2016   Diabetes type 2, controlled (HCC) 09/12/2015   OSA (obstructive sleep apnea) 02/16/2015   Asthma 10/29/2011    PCP: Eartha Inch, MD  REFERRING PROVIDER: Tarry Kos, MD  REFERRING DIAG: [phone] (ICD-10-CM) - Status post total left knee replacement  Rationale for Evaluation and Treatment: Rehabilitation  THERAPY DIAG:  Stiffness of left knee, not elsewhere classified  Muscle weakness (generalized)  Localized edema  Acute pain of left knee  Chronic pain of left knee  ONSET DATE: DOS: 09/04/23   SUBJECTIVE:  SUBJECTIVE STATEMENT: says pain is only about 1.5/10 today, does feel like her knee wants to buckle on her at times.   PERTINENT HISTORY:  DM, asthma, GERD, HLD, OSA, anxiety, migraines.  PAIN:  NPRS scale: 1.5/10 upon arrival Pain location:all over but worse in anterior medial knee Pain description: constant, achy, sharp Aggravating factors: sitting too long, car rides Relieving factors: ice,  meds   PRECAUTIONS:  None  RED FLAGS: None   WEIGHT BEARING RESTRICTIONS:   No  FALLS:  Has patient fallen in last 6 months? Yes. Number of falls 2.Feel back in bathrooom and fell back on toilet  OCCUPATION:  Nurse  PLOF:  Independent  PATIENT GOALS:  Reduce pain, get back to normal function, get rid of walker   OBJECTIVE:   DIAGNOSTIC FINDINGS:  09/04/23 XR IMPRESSION: Left knee arthroplasty without immediate postoperative complication.  PATIENT SURVEYS:  09/30/23 FOTO 40% functional, goal is 64%  COGNITIVE STATUS: Within functional limits for tasks assessed   SENSATION: WFL  GAIT: 09/30/23 Ambulates in with RW independently, does not use AD PLOF for community ambulation   EDEMA: 09/30/23  Knee joint line: Moderate edema noted in left knee   LOWER EXTREMITY ROM:     ROM Left eval Left 10/02/22 Left 10/09/2023 Left 10/22/23 Left 10/28/23  Knee flexion A: 90 P: 99 P:105 106 110 A 120 P   Knee extension A: 6 P: 4 A:3 -2 -1 A 0   (Blank rows = not tested)   LOWER EXTREMITY MMT:    MMT Right eval Left eval Left 11/07/23  Knee flexion 5 4 5   Knee extension 5 4 4+   (Blank rows = not tested)    TODAY'S TREATMENT:  11/18/23 TherEx Recumbent bike seat #3 full circles X 10 min L4  TherAct Double Leg Press 100# 2 X15 x full extension (lock left knee out at the top) and stretch into flexion Single Leg Press bil 68# 3X10 x full extension (lock left knee out at the top) and stretch into flexion LLE RDL with 10# KB 2x10 Sit to stands with 10# KB 2X10 (left foot slightly further back) Tandem walk 3 round trips in bars  SLS sliders X 10 forward, X 10 lateral, X 10 retro, (slider under Rt foot) Step ups 6 inch step leading with left up and then down in front with right X 10 reps no UE support Seated knee extension machine left only 10# 2X10,  then DL 93#  2 X 10 Seated hamstring curl machine DL 26# [id number]   5/80/99 TherEx Recumbent bike seat #3 full circles X 10 min L4  TherAct Double Leg Press 100# 2 X15 x full extension (lock  left knee out at the top) and stretch into flexion Single Leg Press bil 62# 2X15 x full extension (lock left knee out at the top) and stretch into flexion TRX squats 3x10; cues for increased LLE awareness LLE RDL with 10# KB 3x10  Neuro Re-ed Rockerboard ant/post and laterally with intermittent UE support: Weight shifting Horizontal and vertical head turns x 10 reps each direction Squats on BOSU 2x10; intermittent UE support needed Single leg step ups onto BOSU with 3 sec SLS x10 bil; occasional UE support needed    PATIENT EDUCATION: Education details: HEP, PT plan of care Person educated: Patient Education method: Explanation, Demonstration, Verbal cues, and Handouts Education comprehension: verbalized understanding and needs further education   HOME EXERCISE PROGRAM: Access Code: [email or web address] URL: [email or web address] Date: 10/09/2023 Prepared by: Pauletta Browns  Exercises - Supine Heel Slide with Strap  - 2 x daily - 6 x weekly - 1-2 sets - 10 reps - 5 hold - Supine Knee Extension Stretch on Towel Roll  - 2 x daily - 6 x weekly - 1 sets - 10 reps - 5 sec hold - seated knee flexion stretch with knee extension strengthening  - 2 x daily - 6 x weekly - 1 sets - 15 reps - 5 sec hold - Seated Long Arc Quad (Mirrored)  - 2 x daily - 6 x weekly - 2 sets - 10 reps - Seated Straight Leg Raise with Quad Contraction  - 2 x daily - 6 x weekly - 2 sets - 10 reps - Sit to Stand with Armchair  - 2 x daily - 6 x weekly - 2 sets - 5 reps - alternating marching  - 2 x daily - 6 x weekly - 1 sets - 10 reps - Supine Quadricep Sets  - 5 x daily - 7 x weekly - 2 sets - 10 reps - 5 second hold - Small Range Straight Leg Raise  - 2 x daily - 7 x weekly - 3-5 sets - 5 reps - 3 seconds hold  ASSESSMENT:  CLINICAL IMPRESSION:  Limp is slowly decreasing but will need a little more work on left leg stability and balance to fully reduce this  OBJECTIVE IMPAIRMENTS: decreased activity  tolerance, difficulty walking, decreased balance, decreased endurance, decreased functional mobility, decreased ROM, decreased strength, impaired flexibility, impaired LE use, and pain.  ACTIVITY LIMITATIONS: bending, lifting, carry, locomotion, cleaning, community activity, driving, and or occupation  PERSONAL FACTORS: see above PMH are also affecting patient's functional outcome.  REHAB POTENTIAL: Excellent  CLINICAL DECISION MAKING: Stable/uncomplicated  EVALUATION COMPLEXITY: Low    GOALS: Short term PT Goals Target date: 10/28/2023  Pt will be I and compliant with HEP. Baseline:  Goal status: MET 10/07/23 Pt will decrease pain by 25% overall Baseline: Goal status: ongoing 10/09/23  Long term PT goals Target date:11/25/2023   Pt will improve left ROM 0-120 deg to improve functional mobility Baseline: Goal status: ongoing 10/09/23 Pt will improve  hip/knee strength to at least 5-/5 MMT to improve functional strength Baseline: Goal status: ongoing 10/07/23 Pt will improve FOTO to at least 64% functional to show improved function Baseline: Goal status: ongoing 10/07/23 Pt will reduce pain to overall less than 2-3/10 with usual activity and work activity. Baseline: Goal status: ongoing 10/09/23 Pt will be able to ambulate community distances at least 1000 ft WNL gait pattern without complaints Baseline: Goal status: ongoing 10/09/23  PLAN: PT FREQUENCY: 1-3 times per week   PT DURATION: 6-8 weeks  PLANNED INTERVENTIONS (unless contraindicated): aquatic PT, Canalith repositioning, cryotherapy, Electrical stimulation, Iontophoresis with 4 mg/ml dexamethasome, Moist heat, traction, Ultrasound, gait training, Therapeutic exercise, balance training, neuromuscular re-education, patient/family education,manual techniques, passive ROM, dry needling, taping, vasopnuematic device, vestibular, spinal manipulations, joint manipulations 97110-Therapeutic exercises, 97530- Therapeutic  activity, 97112- Neuromuscular re-education, 97535- Self Care, and [id number]- Manual therapy  PLAN FOR NEXT SESSION:  Recert for remaining few visits balance and continue quadriceps strengthening and functional strenth, single leg stability with gait progressions to reduce limp as appropriate. Vaso if desired   NEXT MD APPT: not scheduled; per note needs one around 11/25/23  Ivery Quale, PT, DPT 11/18/23 1:44 PM

## 2023-11-19 ENCOUNTER — Encounter: Payer: Self-pay | Admitting: Orthopaedic Surgery

## 2023-11-19 ENCOUNTER — Other Ambulatory Visit: Payer: Self-pay

## 2023-11-19 ENCOUNTER — Encounter: Payer: Commercial Managed Care - PPO | Admitting: Physical Therapy

## 2023-11-19 ENCOUNTER — Encounter: Payer: Self-pay | Admitting: Physical Therapy

## 2023-11-19 ENCOUNTER — Ambulatory Visit: Payer: Commercial Managed Care - PPO | Admitting: Physical Therapy

## 2023-11-19 ENCOUNTER — Other Ambulatory Visit (HOSPITAL_COMMUNITY): Payer: Self-pay

## 2023-11-19 ENCOUNTER — Other Ambulatory Visit: Payer: Self-pay | Admitting: Physician Assistant

## 2023-11-19 DIAGNOSIS — M25662 Stiffness of left knee, not elsewhere classified: Secondary | ICD-10-CM | POA: Diagnosis not present

## 2023-11-19 DIAGNOSIS — R600 Localized edema: Secondary | ICD-10-CM

## 2023-11-19 DIAGNOSIS — M6281 Muscle weakness (generalized): Secondary | ICD-10-CM | POA: Diagnosis not present

## 2023-11-19 DIAGNOSIS — M25562 Pain in left knee: Secondary | ICD-10-CM

## 2023-11-19 MED ORDER — HYDROCODONE-ACETAMINOPHEN 7.5-325 MG PO TABS
1.0000 | ORAL_TABLET | Freq: Two times a day (BID) | ORAL | 0 refills | Status: AC | PRN
  Filled 2023-11-19: qty 28, 7d supply, fill #0

## 2023-11-19 MED ORDER — AMOXICILLIN 500 MG PO CAPS
2000.0000 mg | ORAL_CAPSULE | ORAL | 2 refills | Status: AC
  Filled 2023-11-19: qty 8, 2d supply, fill #0

## 2023-11-19 NOTE — Therapy (Signed)
 OUTPATIENT PHYSICAL THERAPY TREATMENT   Patient Name: Brynn Mulgrew MRN: [id number] DOB:08/02/1971, 53 y.o., female Today's Date: 11/19/2023  END OF SESSION:  PT End of Session - 11/19/23 0926     Visit Number 17    Number of Visits 23    Date for PT Re-Evaluation 12/10/23    Authorization Type [employer] $25 copay    PT Start Time 0845    PT Stop Time 0925    PT Time Calculation (min) 40 min    Activity Tolerance Patient tolerated treatment well    Behavior During Therapy WFL for tasks assessed/performed                Past Medical History:  Diagnosis Date   Acid reflux    Anxiety    Asthma    Diabetes mellitus without complication (HCC)    Headache    migraines   Hyperlipidemia    Hypertension    Osteoarthritis of left knee    Palpitations    PCOS (polycystic ovarian syndrome)    PONV (postoperative nausea and vomiting)    Sleep apnea    uses cpap   Past Surgical History:  Procedure Laterality Date   BREAST BIOPSY Left 04/09/2023   BREAST BIOPSY Left 04/09/2023   [place] LT BREAST BX W LOC DEV 1ST LESION IMG BX SPEC US GUIDE 04/09/2023 [facility]   BREAST BIOPSY Left 04/17/2023   MM LT BREAST BX W LOC DEV 1ST LESION IMAGE BX SPEC STEREO GUIDE 04/17/2023 [facility]   BREAST EXCISIONAL BIOPSY Right 06/28/2020   papilloma   DILATION AND EVACUATION N/A 06/05/2017   Procedure: DILATATION AND EVACUATION;  Surgeon: Essie Hart, MD;  Location: [facility];  Service: Gynecology;  Laterality: N/A;   KNEE ARTHROSCOPY WITH MENISCAL REPAIR Left 09/20/2021   Procedure: LEFT KNEE PARTIAL MEDIAL MENISCECTOMY;  Surgeon: Tarry Kos, MD;  Location: [facility];  Service: Orthopedics;  Laterality: Left;   RADIOACTIVE SEED GUIDED EXCISIONAL BREAST BIOPSY Right 06/28/2020   Procedure: RIGHT RADIOACTIVE SEED GUIDED EXCISIONAL BREAST BIOPSY;  Surgeon: Emelia Loron, MD;  Location: [facility];  Service: General;  Laterality: Right;    TOTAL KNEE ARTHROPLASTY Left 09/04/2023   Procedure: LEFT TOTAL KNEE ARTHROPLASTY;  Surgeon: Tarry Kos, MD;  Location: [facility];  Service: Orthopedics;  Laterality: Left;   WISDOM TOOTH EXTRACTION     Patient Active Problem List   Diagnosis Date Noted   Status post total left knee replacement 09/04/2023   Chronic migraine without aura without status migrainosus, not intractable 04/14/2021   Muscle spasm 06/20/2018   Unspecified dyspareunia (CODE) 03/20/2017   Amenorrhea 03/20/2017   Irritable bowel syndrome 01/01/2017   Status migrainosus 01/01/2017   Anxiety 01/01/2017   Vaginal discharge 12/17/2016   GERD (gastroesophageal reflux disease) 10/30/2016   Mixed hyperlipidemia 10/30/2016   Diabetes type 2, controlled (HCC) 09/12/2015   OSA (obstructive sleep apnea) 02/16/2015   Asthma 10/29/2011    PCP: Eartha Inch, MD  REFERRING PROVIDER: Tarry Kos, MD  REFERRING DIAG: [phone] (ICD-10-CM) - Status post total left knee replacement  Rationale for Evaluation and Treatment: Rehabilitation  THERAPY DIAG:  Muscle weakness (generalized)  Localized edema  Stiffness of left knee, not elsewhere classified  Acute pain of left knee  ONSET DATE: DOS: 09/04/23   SUBJECTIVE:  SUBJECTIVE STATEMENT: says pain is only about 1.5/10 today, does feel like her knee wants to buckle on her at times.   PERTINENT HISTORY:  DM, asthma, GERD, HLD, OSA, anxiety, migraines.  PAIN:  NPRS scale: 1.5/10 upon arrival Pain location:all over but worse in anterior medial knee Pain description: constant, achy, sharp Aggravating factors: sitting too long, car rides Relieving factors: ice,  meds   PRECAUTIONS:  None  RED FLAGS: None   WEIGHT BEARING RESTRICTIONS:  No  FALLS:  Has patient fallen in  last 6 months? Yes. Number of falls 2.Feel back in bathrooom and fell back on toilet  OCCUPATION:  Nurse  PLOF:  Independent  PATIENT GOALS:  Reduce pain, get back to normal function, get rid of walker   OBJECTIVE:   DIAGNOSTIC FINDINGS:  09/04/23 XR IMPRESSION: Left knee arthroplasty without immediate postoperative complication.  PATIENT SURVEYS:  09/30/23 FOTO 40% functional, goal is 64%  COGNITIVE STATUS: Within functional limits for tasks assessed   SENSATION: WFL  GAIT: 09/30/23 Ambulates in with RW independently, does not use AD PLOF for community ambulation   EDEMA: 09/30/23  Knee joint line: Moderate edema noted in left knee   LOWER EXTREMITY ROM:     ROM Left eval Left 10/02/22 Left 10/09/2023 Left 10/22/23 Left 10/28/23  Knee flexion A: 90 P: 99 P:105 106 110 A 120 P   Knee extension A: 6 P: 4 A:3 -2 -1 A 0   (Blank rows = not tested)   LOWER EXTREMITY MMT:    MMT Right eval Left eval Left 11/07/23  Knee flexion 5 4 5   Knee extension 5 4 4+   (Blank rows = not tested)    TODAY'S TREATMENT:  11/19/23 TherEx Recumbent bike seat #3 full circles X 10 min L4  TherAct Double Leg Press 100# 2 X15 x full extension (lock left knee out at the top) and stretch into flexion Single Leg Press bil 68# 3X10 x full extension (lock left knee out at the top) and stretch into flexion LLE RDL with 10# KB 2x10 Sit to stands with 10# KB 2X10 (left foot slightly further back) Tandem walk 3 round trips in bars  SLS sliders X 10 forward, X 10 lateral, X 10 retro, (slider under Rt foot) Step ups 6 inch step leading with left up and then down in front with right X 12 reps no UE support Seated knee extension machine left only 10# 2X10,  then DL 40#  2 X 10 Seated hamstring curl machine DL 98# 1X91  4/78/29 TherEx Recumbent bike seat #3 full circles X 10 min L4  TherAct Double Leg Press 100# 2 X15 x full extension (lock left knee out at the top) and stretch into  flexion Single Leg Press bil 68# 3X10 x full extension (lock left knee out at the top) and stretch into flexion LLE RDL with 10# KB 2x10 Sit to stands with 10# KB 2X10 (left foot slightly further back) Tandem walk 3 round trips in bars  SLS sliders X 10 forward, X 10 lateral, X 10 retro, (slider under Rt foot) Step ups 6 inch step leading with left up and then down in front with right X 10 reps no UE support Seated knee extension machine left only 10# 2X10,  then DL 56#  2 X 10 Seated hamstring curl machine DL 21# [id number]   5/78/46 TherEx Recumbent bike seat #3 full circles X 10 min L4  TherAct Double Leg Press 100# 2 X15 x  full extension (lock left knee out at the top) and stretch into flexion Single Leg Press bil 62# 2X15 x full extension (lock left knee out at the top) and stretch into flexion TRX squats 3x10; cues for increased LLE awareness LLE RDL with 10# KB 3x10  Neuro Re-ed Rockerboard ant/post and laterally with intermittent UE support: Weight shifting Horizontal and vertical head turns x 10 reps each direction Squats on BOSU 2x10; intermittent UE support needed Single leg step ups onto BOSU with 3 sec SLS x10 bil; occasional UE support needed    PATIENT EDUCATION: Education details: HEP, PT plan of care Person educated: Patient Education method: Explanation, Demonstration, Verbal cues, and Handouts Education comprehension: verbalized understanding and needs further education   HOME EXERCISE PROGRAM: Access Code: [email or web address] URL: [email or web address] Date: 10/09/2023 Prepared by: Pauletta Browns  Exercises - Supine Heel Slide with Strap  - 2 x daily - 6 x weekly - 1-2 sets - 10 reps - 5 hold - Supine Knee Extension Stretch on Towel Roll  - 2 x daily - 6 x weekly - 1 sets - 10 reps - 5 sec hold - seated knee flexion stretch with knee extension strengthening  - 2 x daily - 6 x weekly - 1 sets - 15 reps - 5 sec hold - Seated Long Arc Quad (Mirrored)  - 2 x  daily - 6 x weekly - 2 sets - 10 reps - Seated Straight Leg Raise with Quad Contraction  - 2 x daily - 6 x weekly - 2 sets - 10 reps - Sit to Stand with Armchair  - 2 x daily - 6 x weekly - 2 sets - 5 reps - alternating marching  - 2 x daily - 6 x weekly - 1 sets - 10 reps - Supine Quadricep Sets  - 5 x daily - 7 x weekly - 2 sets - 10 reps - 5 second hold - Small Range Straight Leg Raise  - 2 x daily - 7 x weekly - 3-5 sets - 5 reps - 3 seconds hold  ASSESSMENT:  CLINICAL IMPRESSION:  She had good tolerance to session today. Recert performed as her PT plan of care had expired. She has met most of PT goals but not all so recommend up to 3 more weeks of PT to work toward her functional goals and maximize function.  OBJECTIVE IMPAIRMENTS: decreased activity tolerance, difficulty walking, decreased balance, decreased endurance, decreased functional mobility, decreased ROM, decreased strength, impaired flexibility, impaired LE use, and pain.  ACTIVITY LIMITATIONS: bending, lifting, carry, locomotion, cleaning, community activity, driving, and or occupation  PERSONAL FACTORS: see above PMH are also affecting patient's functional outcome.  REHAB POTENTIAL: Excellent  CLINICAL DECISION MAKING: Stable/uncomplicated  EVALUATION COMPLEXITY: Low    GOALS: Short term PT Goals Target date: 10/28/2023  Pt will be I and compliant with HEP. Baseline:  Goal status: MET 10/07/23 Pt will decrease pain by 25% overall Baseline: Goal status: MET 11/18/22  Long term PT goals Target date:12/10/2023   Pt will improve left ROM 0-120 deg to improve functional mobility Baseline: Goal status: MET 11/18/22 Pt will improve  hip/knee strength to at least 5-/5 MMT to improve functional strength Baseline: Goal status: MET 11/18/22 Pt will improve FOTO to at least 64% functional to show improved function Baseline: Goal status: ongoing 11/19/23 Pt will reduce pain to overall less than 2-3/10 with usual activity  and work activity. Baseline: Goal status: MET 11/18/22 Pt will be able to ambulate community distances  at least 1000 ft WNL gait pattern without complaints Baseline: Goal status: ongoing 2/255/25  PLAN: PT FREQUENCY: 1-2 times per week   PT DURATION: 3 weeks  PLANNED INTERVENTIONS (unless contraindicated): aquatic PT, Canalith repositioning, cryotherapy, Electrical stimulation, Iontophoresis with 4 mg/ml dexamethasome, Moist heat, traction, Ultrasound, gait training, Therapeutic exercise, balance training, neuromuscular re-education, patient/family education,manual techniques, passive ROM, dry needling, taping, vasopnuematic device, vestibular, spinal manipulations, joint manipulations 97110-Therapeutic exercises, 97530- Therapeutic activity, 97112- Neuromuscular re-education, 97535- Self Care, and 16109- Manual therapy  PLAN FOR NEXT SESSION:  Recert for remaining few visits, planning to return back to work 12/02/23 balance and continue quadriceps strengthening and functional strenth, single leg stability with gait progressions to reduce limp as appropriate.   NEXT MD APPT: not scheduled; per note needs one around 11/25/23  Ivery Quale, PT, DPT 11/19/23 9:27 AM

## 2023-11-19 NOTE — Telephone Encounter (Signed)
 Someone else in chart so I cannot send in.  Can you send in amoxicillin 500 mg caps take 4 pills one hour prior to dental work #8 with two refills.  thanks

## 2023-11-20 ENCOUNTER — Encounter: Payer: Commercial Managed Care - PPO | Admitting: Physical Therapy

## 2023-11-21 DIAGNOSIS — E119 Type 2 diabetes mellitus without complications: Secondary | ICD-10-CM | POA: Diagnosis not present

## 2023-11-22 ENCOUNTER — Other Ambulatory Visit (HOSPITAL_COMMUNITY): Payer: Self-pay

## 2023-11-22 ENCOUNTER — Encounter: Payer: Self-pay | Admitting: Physician Assistant

## 2023-11-22 ENCOUNTER — Other Ambulatory Visit: Payer: Self-pay

## 2023-11-22 ENCOUNTER — Ambulatory Visit (INDEPENDENT_AMBULATORY_CARE_PROVIDER_SITE_OTHER): Payer: Commercial Managed Care - PPO | Admitting: Physician Assistant

## 2023-11-22 DIAGNOSIS — M62838 Other muscle spasm: Secondary | ICD-10-CM | POA: Diagnosis not present

## 2023-11-22 DIAGNOSIS — G43709 Chronic migraine without aura, not intractable, without status migrainosus: Secondary | ICD-10-CM

## 2023-11-22 MED ORDER — REYVOW 100 MG PO TABS
200.0000 mg | ORAL_TABLET | ORAL | 2 refills | Status: DC | PRN
  Filled 2023-11-22: qty 16, 60d supply, fill #0
  Filled 2024-01-30: qty 16, 60d supply, fill #1
  Filled 2024-03-26: qty 16, 60d supply, fill #2

## 2023-11-22 NOTE — Progress Notes (Signed)
 Pt notes improvement with HA's.

## 2023-11-22 NOTE — Progress Notes (Signed)
 History:  Julie Hansen is a 53 y.o. [id number] who presents to clinic today for headache followup.  She had knee replacement in December.  Headaches have been much better while she is home recovering.  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  She is considering a change in employment to address this.  Her medications for headache are working.  She had significant pain with knee replacement the first 6 weeks but is seeing improvement.  She has been compliant with PT including home exercise and has reached goal.  She hopes to continue exercise even after PT>  Number of days in the last 4 weeks with:  Severe headache: 2 Moderate headache: 1 Mild headache: 0  No headache: 25   Past Medical History:  Diagnosis Date   Acid reflux    Anxiety    Asthma    Diabetes mellitus without complication (HCC)    Headache    migraines   Hyperlipidemia    Hypertension    Osteoarthritis of left knee    Palpitations    PCOS (polycystic ovarian syndrome)    PONV (postoperative nausea and vomiting)    Sleep apnea    uses cpap    Social History   Socioeconomic History   Marital status: Married    Spouse name: Not on file   Number of children: 2   Years of education: Not on file   Highest education level: Not on file  Occupational History   Not on file  Tobacco Use   Smoking status: Never    Passive exposure: Never   Smokeless tobacco: Never  Vaping Use   Vaping status: Never Used  Substance and Sexual Activity   Alcohol use: Yes    Comment: maybe a drink once a month   Drug use: No   Sexual activity: Yes    Birth control/protection: None  Other Topics Concern   Not on file  Social History Narrative   Not on file   Social Drivers of Health   Financial Resource Strain: Low Risk  (10/02/2023)   Received from [facility]   Overall Financial Resource Strain (CARDIA)    Difficulty of Paying Living Expenses: Not hard at  all  Food Insecurity: No Food Insecurity (10/02/2023)   Received from [facility]   Hunger Vital Sign    Worried About Running Out of Food in the Last Year: Never true    Ran Out of Food in the Last Year: Never true  Transportation Needs: No Transportation Needs (10/02/2023)   Received from [facility] - Transportation    Lack of Transportation (Medical): No    Lack of Transportation (Non-Medical): No  Physical Activity: Unknown (03/25/2023)   Received from [facility], [facility]   Exercise Vital Sign    Days of Exercise per Week: 0 days    Minutes of Exercise per Session: Not on file  Stress: No Stress Concern Present (03/25/2023)   Received from [facility], [facility] - Occupational Stress Questionnaire    Feeling of Stress : Only a little  Social Connections: Socially Integrated (03/25/2023)   Received from [facility], [facility]   Social Network    How would you rate your social network (family, work, friends)?: Good participation with social networks  Intimate Partner Violence: Not At Risk (09/04/2023)   Humiliation, Afraid, Rape, and Kick questionnaire  Fear of Current or Ex-Partner: No    Emotionally Abused: No    Physically Abused: No    Sexually Abused: No    Family History  Problem Relation Age of Onset   Cancer Mother    Hypertension Mother    Diabetes Mother    Breast cancer Mother 12   Cancer Maternal Grandmother    Breast cancer Maternal Grandmother 20   Cancer Father    Diabetes Father    Hypertension Father    Breast cancer Paternal Aunt 47   Breast cancer Paternal Aunt 29   Breast cancer Paternal Aunt 19    Allergies  Allergen Reactions   Macrodantin [Nitrofurantoin Macrocrystal] Itching   Carbamazepine Other (See Comments)    Vaginal discharge   Vraylar [Cariprazine Hcl] Itching    Current Outpatient Medications on File Prior to Visit  Medication Sig Dispense Refill    amoxicillin (AMOXIL) 500 MG capsule Take 4 capsules (2,000 mg total) by mouth 1 hour prior to dental procedure 8 capsule 2   ibuprofen (ADVIL) 800 MG tablet Take 1 tablet (800 mg total) by mouth 3 (three) times daily with meals as needed for headache or moderate pain (pain score 4-6). 60 tablet 1   albuterol (PROVENTIL) (2.5 MG/3ML) 0.083% nebulizer solution Take 2.5 mg by nebulization every 6 (six) hours as needed for wheezing or shortness of breath.     ALPRAZolam (XANAX) 1 MG tablet Take 0.5-1 tablets (0.5-1 mg total) by mouth 2 (two) times daily as needed for severe anxiety. 60 tablet 0   ALPRAZolam (XANAX) 1 MG tablet Take 1/2 to 1 tablet by mouth as needed up to twice a day for severe anxiety. 60 tablet 0   atorvastatin (LIPITOR) 40 MG tablet Take 1 tablet (40 mg total) by mouth daily. 90 tablet 3   baclofen (LIORESAL) 10 MG tablet Take 1 tablet (10 mg total) by mouth as needed for muscle spasms. 30 each 2   beclomethasone (QVAR REDIHALER) 40 MCG/ACT inhaler Inhale 2 puffs into the lungs 2 times daily (Patient taking differently: Inhale 2 puffs into the lungs 2 (two) times daily as needed (Asthma).) 26.1 g 2   Blood Glucose Monitoring Suppl (ACCU-CHEK GUIDE) w/Device KIT Use as directed to test blood glucose three times daily. 1 kit 0   botulinum toxin Type A (BOTOX) 200 units injection Inject 200 Units into the muscle every 3 (three) months. 1 each 2   Continuous Glucose Sensor (FREESTYLE LIBRE 3 SENSOR) MISC Inject into the skin, change every 14 days 6 each 3   cyclobenzaprine (FLEXERIL) 10 MG tablet Take 1 tablet (10 mg total) by mouth 3 (three) times daily. 30 tablet 1   dapagliflozin propanediol (FARXIGA) 10 MG TABS tablet Take 1 tablet (10 mg total) by mouth daily. 30 tablet 5   docusate sodium (COLACE) 100 MG capsule Take 1 capsule (100 mg total) by mouth daily as needed. 30 capsule 2   doxycycline (VIBRAMYCIN) 100 MG capsule Take 1 capsule (100 mg total) by mouth 2 (two) times daily. To  be taken after surgery 20 capsule 0   FLUoxetine (PROZAC) 10 MG capsule Take 1 capsule (10 mg total) by mouth daily. 90 capsule 1   FLUoxetine (PROZAC) 20 MG capsule Take 1 capsule (20 mg total) by mouth daily. 90 capsule 1   gabapentin (NEURONTIN) 400 MG capsule Take 1 capsule (400 mg total) by mouth 3 (three) times daily. 270 capsule 2   glucose blood test strip Use to check blood  sugar 3 times a day 100 each 6   glucose blood test strip Use as directed to test blood glucose three times daily. 100 each 4   HYDROcodone-acetaminophen (NORCO) 7.5-325 MG tablet Take 1-2 tablets by mouth 2 (two) times daily as needed for moderate pain (pain score 4-6). 28 tablet 0   insulin degludec (TRESIBA) 200 UNIT/ML FlexTouch Pen Inject 45 units into the skin in the morning and 74 units into the skin in the evening. (Patient taking differently: Inject 40-60 Units into the skin See admin instructions. Inject 40 units into the skin every morning and 60 units every night.) 45 mL 5   insulin lispro (HUMALOG) 100 UNIT/ML KwikPen Inject 20-25 Units into the skin 3 (three) times daily. 15 mL 6   Lancets (FREESTYLE) lancets Use to check blood sugar 4 times a day 400 each 4   Lasmiditan Succinate (REYVOW) 100 MG TABS Take 2 tablets (200 mg) by mouth as needed for headache 16 tablet 2   meloxicam (MOBIC) 15 MG tablet Take 1 tablet (15 mg total) by mouth daily. 30 tablet 1   methocarbamol (ROBAXIN-750) 750 MG tablet Take 1 tablet (750 mg total) by mouth 2 (two) times daily as needed for muscle spasms. 30 tablet 2   metoprolol succinate (TOPROL-XL) 50 MG 24 hr tablet Take 1 tablet (50 mg total) by mouth daily. Take with or immediately following a meal. 90 tablet 1   omeprazole (PRILOSEC) 40 MG capsule Take 1 capsule (40 mg total) by mouth daily. 90 capsule 1   ondansetron (ZOFRAN) 4 MG tablet Take 1 tablet (4 mg total) by mouth every 8 (eight) hours as needed for nausea or vomiting. 40 tablet 0   oxyCODONE (ROXICODONE) 5 MG  immediate release tablet To be taken as needed once daily for PT 10 tablet 0   oxyCODONE-acetaminophen (PERCOCET) 5-325 MG tablet Take 1-2 tablets by mouth daily as needed. 30 tablet 0   oxyCODONE-acetaminophen (PERCOCET) 7.5-325 MG tablet Take 1-2 tablets by mouth every 6 (six) hours as needed for severe pain (pain score 7-10). 30 tablet 0   oxyCODONE-acetaminophen (PERCOCET/ROXICET) 5-325 MG tablet Take 1 tablet by mouth every 4 (four) hours as needed for pain 8 tablet 0   Plecanatide (TRULANCE) 3 MG TABS Take 1 tablet (3 mg total) by mouth daily. 30 tablet 6   Potassium Chloride ER 20 MEQ TBCR Take 1 tablet (20 mEq) by mouth daily. 90 tablet 3   promethazine (PHENERGAN) 25 MG tablet Take 1 tablet (25 mg total) by mouth every 6 (six) hours as needed. 30 tablet 1   rivaroxaban (XARELTO) 10 MG TABS tablet Take 1 tablet (10 mg total) by mouth daily. To be taken after surgery to prevent blood clots 30 tablet 0   tirzepatide (MOUNJARO) 15 MG/0.5ML Pen Inject 15 mg into the skin once a week. 6 mL 5   valsartan-hydrochlorothiazide (DIOVAN-HCT) 160-12.5 MG tablet Take 1 tablet by mouth daily. 90 tablet 3   Vitamin D, Ergocalciferol, (DRISDOL) 1.25 MG (50000 UNIT) CAPS capsule Take 1 capsule (50,000 Units total) by mouth 2 (two) times a week. 24 capsule 3   zolpidem (AMBIEN CR) 12.5 MG CR tablet Take 1 tablet (12.5 mg total) by mouth at bedtime as needed. Max daily amount: 12.5mg  (Patient taking differently: Take 12.5 mg by mouth at bedtime.) 90 tablet 0   No current facility-administered medications on file prior to visit.     Review of Systems:  All pertinent positive/negative included in HPI, all other  review of systems are negative   Objective:  Physical Exam There were no vitals taken for this visit. CONSTITUTIONAL: Well-developed, well-nourished female in no acute distress.  EYES: EOM intact ENT: Normocephalic CARDIOVASCULAR: Regular rate  RESPIRATORY: Normal rate.  MUSCULOSKELETAL: Normal  ROM SKIN: Warm, dry without erythema  NEUROLOGICAL: Alert, oriented, CN II-XII grossly intact, Appropriate balance  PSYCH: Normal behavior, mood   Assessment & Plan:  Assessment: 1. Chronic migraine without aura without status migrainosus, not intractable   2. Muscle spasm       Plan: Continue gabapentin, metoprolol and botox for migraine prevention - return for botox asap Baclofen for mild HA Phenergan for nausea/migraine rescue Reyvow for migraine rescue  Follow-up asap for botox  31 minutes spent in direct care of pt this encounter Bertram Denver, PA-C 11/22/2023 10:22 AM

## 2023-11-25 ENCOUNTER — Other Ambulatory Visit (HOSPITAL_COMMUNITY): Payer: Self-pay

## 2023-11-25 ENCOUNTER — Ambulatory Visit: Payer: Commercial Managed Care - PPO | Admitting: Physical Therapy

## 2023-11-25 ENCOUNTER — Other Ambulatory Visit: Payer: Self-pay

## 2023-11-25 ENCOUNTER — Encounter: Payer: Self-pay | Admitting: Physical Therapy

## 2023-11-25 ENCOUNTER — Ambulatory Visit: Payer: Commercial Managed Care - PPO | Admitting: Physician Assistant

## 2023-11-25 DIAGNOSIS — R600 Localized edema: Secondary | ICD-10-CM | POA: Diagnosis not present

## 2023-11-25 DIAGNOSIS — M25562 Pain in left knee: Secondary | ICD-10-CM | POA: Diagnosis not present

## 2023-11-25 DIAGNOSIS — Z96652 Presence of left artificial knee joint: Secondary | ICD-10-CM

## 2023-11-25 DIAGNOSIS — M25662 Stiffness of left knee, not elsewhere classified: Secondary | ICD-10-CM

## 2023-11-25 DIAGNOSIS — M6281 Muscle weakness (generalized): Secondary | ICD-10-CM | POA: Diagnosis not present

## 2023-11-25 MED ORDER — TRAMADOL HCL 50 MG PO TABS
50.0000 mg | ORAL_TABLET | Freq: Two times a day (BID) | ORAL | 1 refills | Status: DC | PRN
  Filled 2023-11-25: qty 30, 15d supply, fill #0
  Filled 2024-01-01 – 2024-01-08 (×2): qty 30, 15d supply, fill #1

## 2023-11-25 MED ORDER — CYCLOBENZAPRINE HCL 10 MG PO TABS
10.0000 mg | ORAL_TABLET | Freq: Three times a day (TID) | ORAL | 1 refills | Status: DC
  Filled 2023-11-25: qty 30, 10d supply, fill #0
  Filled 2023-12-12: qty 30, 10d supply, fill #1

## 2023-11-25 MED ORDER — DICLOFENAC SODIUM 75 MG PO TBEC
75.0000 mg | DELAYED_RELEASE_TABLET | Freq: Two times a day (BID) | ORAL | 2 refills | Status: AC | PRN
  Filled 2023-11-25: qty 60, 30d supply, fill #0
  Filled 2024-01-01: qty 60, 30d supply, fill #1
  Filled 2024-01-31: qty 60, 30d supply, fill #2

## 2023-11-25 NOTE — Therapy (Signed)
 OUTPATIENT PHYSICAL THERAPY TREATMENT/MD progress note   Patient Name: Julie Hansen MRN: [id number] DOB:04/12/1971, 53 y.o., female Today's Date: 11/25/2023  END OF SESSION:  PT End of Session - 11/25/23 0856     Visit Number 18    Number of Visits 23    Date for PT Re-Evaluation 12/10/23    Authorization Type [employer] $25 copay    PT Start Time 0844    PT Stop Time 0926    PT Time Calculation (min) 42 min    Activity Tolerance Patient tolerated treatment well    Behavior During Therapy WFL for tasks assessed/performed                 Past Medical History:  Diagnosis Date   Acid reflux    Anxiety    Asthma    Diabetes mellitus without complication (HCC)    Headache    migraines   Hyperlipidemia    Hypertension    Osteoarthritis of left knee    Palpitations    PCOS (polycystic ovarian syndrome)    PONV (postoperative nausea and vomiting)    Sleep apnea    uses cpap   Past Surgical History:  Procedure Laterality Date   BREAST BIOPSY Left 04/09/2023   BREAST BIOPSY Left 04/09/2023   [place] LT BREAST BX W LOC DEV 1ST LESION IMG BX SPEC US GUIDE 04/09/2023 [facility]   BREAST BIOPSY Left 04/17/2023   MM LT BREAST BX W LOC DEV 1ST LESION IMAGE BX SPEC STEREO GUIDE 04/17/2023 [facility]   BREAST EXCISIONAL BIOPSY Right 06/28/2020   papilloma   DILATION AND EVACUATION N/A 06/05/2017   Procedure: DILATATION AND EVACUATION;  Surgeon: Essie Hart, MD;  Location: [facility];  Service: Gynecology;  Laterality: N/A;   KNEE ARTHROSCOPY WITH MENISCAL REPAIR Left 09/20/2021   Procedure: LEFT KNEE PARTIAL MEDIAL MENISCECTOMY;  Surgeon: Tarry Kos, MD;  Location: [facility];  Service: Orthopedics;  Laterality: Left;   RADIOACTIVE SEED GUIDED EXCISIONAL BREAST BIOPSY Right 06/28/2020   Procedure: RIGHT RADIOACTIVE SEED GUIDED EXCISIONAL BREAST BIOPSY;  Surgeon: Emelia Loron, MD;  Location: [facility];  Service: General;   Laterality: Right;   TOTAL KNEE ARTHROPLASTY Left 09/04/2023   Procedure: LEFT TOTAL KNEE ARTHROPLASTY;  Surgeon: Tarry Kos, MD;  Location: [facility];  Service: Orthopedics;  Laterality: Left;   WISDOM TOOTH EXTRACTION     Patient Active Problem List   Diagnosis Date Noted   Status post total left knee replacement 09/04/2023   Chronic migraine without aura without status migrainosus, not intractable 04/14/2021   Muscle spasm 06/20/2018   Unspecified dyspareunia (CODE) 03/20/2017   Amenorrhea 03/20/2017   Irritable bowel syndrome 01/01/2017   Status migrainosus 01/01/2017   Anxiety 01/01/2017   Vaginal discharge 12/17/2016   GERD (gastroesophageal reflux disease) 10/30/2016   Mixed hyperlipidemia 10/30/2016   Diabetes type 2, controlled (HCC) 09/12/2015   OSA (obstructive sleep apnea) 02/16/2015   Asthma 10/29/2011    PCP: Eartha Inch, MD  REFERRING PROVIDER: Tarry Kos, MD  REFERRING DIAG: [phone] (ICD-10-CM) - Status post total left knee replacement  Rationale for Evaluation and Treatment: Rehabilitation  THERAPY DIAG:  Muscle weakness (generalized)  Stiffness of left knee, not elsewhere classified  Localized edema  Acute pain of left knee  ONSET DATE: DOS: 09/04/23   SUBJECTIVE:  SUBJECTIVE STATEMENT: relays no pain upon arrival, she has started taking naproxen and has not had pain in 3 days. She says she is supposed to start work back next week  PERTINENT HISTORY:  DM, asthma, GERD, HLD, OSA, anxiety, migraines.  PAIN:  NPRS scale: 0 today Pain location:all over but worse in anterior medial knee Pain description: constant, achy, sharp Aggravating factors: sitting too long, car rides Relieving factors: ice,  meds   PRECAUTIONS:  None  RED FLAGS: None   WEIGHT  BEARING RESTRICTIONS:  No  FALLS:  Has patient fallen in last 6 months? Yes. Number of falls 2.Feel back in bathrooom and fell back on toilet  OCCUPATION:  Nurse  PLOF:  Independent  PATIENT GOALS:  Reduce pain, get back to normal function, get rid of walker   OBJECTIVE:   DIAGNOSTIC FINDINGS:  09/04/23 XR IMPRESSION: Left knee arthroplasty without immediate postoperative complication.  PATIENT SURVEYS:  09/30/23 FOTO 40% functional, goal is 64%  COGNITIVE STATUS: Within functional limits for tasks assessed   SENSATION: WFL  GAIT: 09/30/23 Ambulates in with RW independently, does not use AD PLOF for community ambulation   EDEMA: 09/30/23  Knee joint line: Moderate edema noted in left knee   LOWER EXTREMITY ROM:     ROM Left eval Left 10/02/22 Left 10/09/2023 Left 10/22/23 Left 10/28/23  Knee flexion A: 90 P: 99 P:105 106 110 A 120 P   Knee extension A: 6 P: 4 A:3 -2 -1 A 0   (Blank rows = not tested)   LOWER EXTREMITY MMT:    MMT Right eval Left eval Left 11/07/23 Left 11/25/23  Knee flexion 5 4 5 5   Knee extension 5 4 4+ 5   (Blank rows = not tested)    TODAY'S TREATMENT:  11/25/23 TherEx Recumbent bike seat #3 full circles X 10 min L4  TherAct Double Leg Press 100# 3X10 x full extension (lock left knee out at the top) and stretch into flexion Single Leg Press bil 68# 3X10 x full extension (lock left knee out at the top) and stretch into flexion LLE RDL with 10# KB 2x10 Sit to stands with 10# KB 2X10 (left foot slightly further back) Tandem walk 3 round trips in bars  SLS sliders X 10 forward, X 10 lateral, X 10 retro, (slider under Rt foot) Step ups 6 inch step leading with left up and then down in front with right X 12 reps no UE support Seated knee extension machine left only 10# 2X10,  then DL 65#  2 X 10 Seated hamstring curl machine DL 78# [id number]  2/95/28 TherEx Recumbent bike seat #3 full circles X 10 min L4  TherAct Double Leg Press  100# 2 X15 x full extension (lock left knee out at the top) and stretch into flexion Single Leg Press bil 68# 3X10 x full extension (lock left knee out at the top) and stretch into flexion LLE RDL with 10# KB 2x10 Sit to stands with 10# KB 2X10 (left foot slightly further back) Tandem walk 3 round trips in bars  SLS sliders X 10 forward, X 10 lateral, X 10 retro, (slider under Rt foot) Step ups 6 inch step leading with left up and then down in front with right X 12 reps no UE support Seated knee extension machine left only 10# 2X10,  then DL 41#  2 X 10 Seated hamstring curl machine DL 32# [id number]     PATIENT EDUCATION: Education details: HEP, PT plan of  care Person educated: Patient Education method: Explanation, Demonstration, Verbal cues, and Handouts Education comprehension: verbalized understanding and needs further education   HOME EXERCISE PROGRAM: Access Code: [email or web address] URL: [email or web address] Date: 10/09/2023 Prepared by: Pauletta Browns  Exercises - Supine Heel Slide with Strap  - 2 x daily - 6 x weekly - 1-2 sets - 10 reps - 5 hold - Supine Knee Extension Stretch on Towel Roll  - 2 x daily - 6 x weekly - 1 sets - 10 reps - 5 sec hold - seated knee flexion stretch with knee extension strengthening  - 2 x daily - 6 x weekly - 1 sets - 15 reps - 5 sec hold - Seated Long Arc Quad (Mirrored)  - 2 x daily - 6 x weekly - 2 sets - 10 reps - Seated Straight Leg Raise with Quad Contraction  - 2 x daily - 6 x weekly - 2 sets - 10 reps - Sit to Stand with Armchair  - 2 x daily - 6 x weekly - 2 sets - 5 reps - alternating marching  - 2 x daily - 6 x weekly - 1 sets - 10 reps - Supine Quadricep Sets  - 5 x daily - 7 x weekly - 2 sets - 10 reps - 5 second hold - Small Range Straight Leg Raise  - 2 x daily - 7 x weekly - 3-5 sets - 5 reps - 3 seconds hold  ASSESSMENT:  CLINICAL IMPRESSION:  She has made great progress overall with PT. PROM for left knee 0-120 deg.  Strength overall good but only missing slight quad strength which should continue to improve with HEP and return to work which she plans to try next week as long as MD feels she is ready at follow up today. I will keep her PT episode open for a few more weeks to allow her to see how return to work goes and she can return if needed.   OBJECTIVE IMPAIRMENTS: decreased activity tolerance, difficulty walking, decreased balance, decreased endurance, decreased functional mobility, decreased ROM, decreased strength, impaired flexibility, impaired LE use, and pain.  ACTIVITY LIMITATIONS: bending, lifting, carry, locomotion, cleaning, community activity, driving, and or occupation  PERSONAL FACTORS: see above PMH are also affecting patient's functional outcome.  REHAB POTENTIAL: Excellent  CLINICAL DECISION MAKING: Stable/uncomplicated  EVALUATION COMPLEXITY: Low    GOALS: Short term PT Goals Target date: 10/28/2023  Pt will be I and compliant with HEP. Baseline:  Goal status: MET 10/07/23 Pt will decrease pain by 25% overall Baseline: Goal status: MET 11/18/22  Long term PT goals Target date:12/10/2023   Pt will improve left ROM 0-120 deg to improve functional mobility Baseline: Goal status: MET 11/18/22 Pt will improve  hip/knee strength to at least 5-/5 MMT to improve functional strength Baseline: Goal status: MET 11/18/22 Pt will improve FOTO to at least 64% functional to show improved function Baseline: Goal status: ongoing 11/19/23 Pt will reduce pain to overall less than 2-3/10 with usual activity and work activity. Baseline: Goal status: MET 11/18/22 Pt will be able to ambulate community distances at least 1000 ft WNL gait pattern without complaints Baseline: Goal status: ongoing 2/255/25  PLAN: PT FREQUENCY: 1-2 times per week   PT DURATION: 3 weeks  PLANNED INTERVENTIONS (unless contraindicated): aquatic PT, Canalith repositioning, cryotherapy, Electrical stimulation,  Iontophoresis with 4 mg/ml dexamethasome, Moist heat, traction, Ultrasound, gait training, Therapeutic exercise, balance training, neuromuscular re-education, patient/family education,manual techniques, passive ROM, dry needling, taping, vasopnuematic device, vestibular,  spinal manipulations, joint manipulations 97110-Therapeutic exercises, 97530- Therapeutic activity, [id number]- Neuromuscular re-education, 97535- Self Care, and [id number]- Manual therapy  PLAN FOR NEXT SESSION:  Recert for remaining few visits, planning to return back to work 12/02/23 balance and continue quadriceps strengthening and functional strenth, single leg stability with gait progressions to reduce limp as appropriate.   NEXT MD APPT: what did MD say?  Ivery Quale, PT, DPT 11/25/23 8:58 AM

## 2023-11-25 NOTE — Progress Notes (Signed)
 Post-Op Visit Note   Patient: Julie Hansen           Date of Birth: 07-Jun-1971           MRN: [id number] Visit Date: 11/25/2023 PCP: Eartha Inch, MD   Assessment & Plan:  Chief Complaint:  Chief Complaint  Patient presents with   Left Knee - Follow-up, Routine Post Op   Visit Diagnoses:  1. Status post total left knee replacement     Plan: Patient is a pleasant 53 year old female who comes in today 3 months status post left total knee replacement 09/04/2023.  She has been doing much better.  She is still in physical therapy where she has made great progress.  She is having some pain mostly at night and if she overdoes things.  Currently taking naproxen for this.  Examination of the left knee reveals range of motion from 0 to 115 degrees.  Stable to valgus varus stress.  She is neurovascularly intact distally.  At this point, I am happy with her progress.  She will continue with physical therapy to work on flexion and strengthening exercises.  I have sent in diclofenac and tramadol to take as needed.  Dental prophylaxis reinforced.  She will follow-up in 3 months for repeat evaluation and 2 view x-rays of the left knee.  Call with concerns or questions.  Follow-Up Instructions: Return in about 3 months (around 02/25/2024).   Orders:  Orders Placed This Encounter  Procedures   XR Knee 1-2 Views Left   No orders of the defined types were placed in this encounter.   Imaging: No new imaging  PMFS History: Patient Active Problem List   Diagnosis Date Noted   Status post total left knee replacement 09/04/2023   Chronic migraine without aura without status migrainosus, not intractable 04/14/2021   Muscle spasm 06/20/2018   Unspecified dyspareunia (CODE) 03/20/2017   Amenorrhea 03/20/2017   Irritable bowel syndrome 01/01/2017   Status migrainosus 01/01/2017   Anxiety 01/01/2017   Vaginal discharge 12/17/2016   GERD (gastroesophageal reflux disease) 10/30/2016    Mixed hyperlipidemia 10/30/2016   Diabetes type 2, controlled (HCC) 09/12/2015   OSA (obstructive sleep apnea) 02/16/2015   Asthma 10/29/2011   Past Medical History:  Diagnosis Date   Acid reflux    Anxiety    Asthma    Diabetes mellitus without complication (HCC)    Headache    migraines   Hyperlipidemia    Hypertension    Osteoarthritis of left knee    Palpitations    PCOS (polycystic ovarian syndrome)    PONV (postoperative nausea and vomiting)    Sleep apnea    uses cpap    Family History  Problem Relation Age of Onset   Cancer Mother    Hypertension Mother    Diabetes Mother    Breast cancer Mother 52   Cancer Maternal Grandmother    Breast cancer Maternal Grandmother 60   Cancer Father    Diabetes Father    Hypertension Father    Breast cancer Paternal Aunt 10   Breast cancer Paternal Aunt 73   Breast cancer Paternal Aunt 17    Past Surgical History:  Procedure Laterality Date   BREAST BIOPSY Left 04/09/2023   BREAST BIOPSY Left 04/09/2023   [place] LT BREAST BX W LOC DEV 1ST LESION IMG BX SPEC US GUIDE 04/09/2023 [facility]   BREAST BIOPSY Left 04/17/2023   MM LT BREAST BX W LOC DEV 1ST LESION IMAGE BX  SPEC STEREO GUIDE 04/17/2023 [facility]   BREAST EXCISIONAL BIOPSY Right 06/28/2020   papilloma   DILATION AND EVACUATION N/A 06/05/2017   Procedure: DILATATION AND EVACUATION;  Surgeon: Essie Hart, MD;  Location: [facility];  Service: Gynecology;  Laterality: N/A;   KNEE ARTHROSCOPY WITH MENISCAL REPAIR Left 09/20/2021   Procedure: LEFT KNEE PARTIAL MEDIAL MENISCECTOMY;  Surgeon: Tarry Kos, MD;  Location: [facility];  Service: Orthopedics;  Laterality: Left;   RADIOACTIVE SEED GUIDED EXCISIONAL BREAST BIOPSY Right 06/28/2020   Procedure: RIGHT RADIOACTIVE SEED GUIDED EXCISIONAL BREAST BIOPSY;  Surgeon: Emelia Loron, MD;  Location: [facility];  Service: General;  Laterality: Right;   TOTAL KNEE ARTHROPLASTY Left  09/04/2023   Procedure: LEFT TOTAL KNEE ARTHROPLASTY;  Surgeon: Tarry Kos, MD;  Location: [facility];  Service: Orthopedics;  Laterality: Left;   WISDOM TOOTH EXTRACTION     Social History   Occupational History   Not on file  Tobacco Use   Smoking status: Never    Passive exposure: Never   Smokeless tobacco: Never  Vaping Use   Vaping status: Never Used  Substance and Sexual Activity   Alcohol use: Yes    Comment: maybe a drink once a month   Drug use: No   Sexual activity: Yes    Birth control/protection: None

## 2023-11-26 ENCOUNTER — Ambulatory Visit (INDEPENDENT_AMBULATORY_CARE_PROVIDER_SITE_OTHER): Payer: Commercial Managed Care - PPO | Admitting: Physical Therapy

## 2023-11-26 ENCOUNTER — Other Ambulatory Visit (HOSPITAL_COMMUNITY): Payer: Self-pay

## 2023-11-26 ENCOUNTER — Encounter: Payer: Self-pay | Admitting: Physical Therapy

## 2023-11-26 DIAGNOSIS — N924 Excessive bleeding in the premenopausal period: Secondary | ICD-10-CM | POA: Diagnosis not present

## 2023-11-26 DIAGNOSIS — M6281 Muscle weakness (generalized): Secondary | ICD-10-CM

## 2023-11-26 DIAGNOSIS — M25662 Stiffness of left knee, not elsewhere classified: Secondary | ICD-10-CM

## 2023-11-26 DIAGNOSIS — M25562 Pain in left knee: Secondary | ICD-10-CM

## 2023-11-26 DIAGNOSIS — R600 Localized edema: Secondary | ICD-10-CM | POA: Diagnosis not present

## 2023-11-26 DIAGNOSIS — G8929 Other chronic pain: Secondary | ICD-10-CM

## 2023-11-26 NOTE — Therapy (Signed)
 OUTPATIENT PHYSICAL THERAPY TREATMENT   Patient Name: Julie Hansen MRN: [id number] DOB:1971-01-02, 53 y.o., female Today's Date: 11/26/2023  END OF SESSION:  PT End of Session - 11/26/23 0901     Visit Number 19    Number of Visits 23    Date for PT Re-Evaluation 12/10/23    Authorization Type [employer] $25 copay    PT Start Time 0845    PT Stop Time 0925    PT Time Calculation (min) 40 min    Activity Tolerance Patient tolerated treatment well    Behavior During Therapy WFL for tasks assessed/performed                 Past Medical History:  Diagnosis Date   Acid reflux    Anxiety    Asthma    Diabetes mellitus without complication (HCC)    Headache    migraines   Hyperlipidemia    Hypertension    Osteoarthritis of left knee    Palpitations    PCOS (polycystic ovarian syndrome)    PONV (postoperative nausea and vomiting)    Sleep apnea    uses cpap   Past Surgical History:  Procedure Laterality Date   BREAST BIOPSY Left 04/09/2023   BREAST BIOPSY Left 04/09/2023   [place] LT BREAST BX W LOC DEV 1ST LESION IMG BX SPEC US GUIDE 04/09/2023 [facility]   BREAST BIOPSY Left 04/17/2023   MM LT BREAST BX W LOC DEV 1ST LESION IMAGE BX SPEC STEREO GUIDE 04/17/2023 [facility]   BREAST EXCISIONAL BIOPSY Right 06/28/2020   papilloma   DILATION AND EVACUATION N/A 06/05/2017   Procedure: DILATATION AND EVACUATION;  Surgeon: Essie Hart, MD;  Location: [facility];  Service: Gynecology;  Laterality: N/A;   KNEE ARTHROSCOPY WITH MENISCAL REPAIR Left 09/20/2021   Procedure: LEFT KNEE PARTIAL MEDIAL MENISCECTOMY;  Surgeon: Tarry Kos, MD;  Location: [facility];  Service: Orthopedics;  Laterality: Left;   RADIOACTIVE SEED GUIDED EXCISIONAL BREAST BIOPSY Right 06/28/2020   Procedure: RIGHT RADIOACTIVE SEED GUIDED EXCISIONAL BREAST BIOPSY;  Surgeon: Emelia Loron, MD;  Location: [facility];  Service: General;  Laterality: Right;    TOTAL KNEE ARTHROPLASTY Left 09/04/2023   Procedure: LEFT TOTAL KNEE ARTHROPLASTY;  Surgeon: Tarry Kos, MD;  Location: [facility];  Service: Orthopedics;  Laterality: Left;   WISDOM TOOTH EXTRACTION     Patient Active Problem List   Diagnosis Date Noted   Status post total left knee replacement 09/04/2023   Chronic migraine without aura without status migrainosus, not intractable 04/14/2021   Muscle spasm 06/20/2018   Unspecified dyspareunia (CODE) 03/20/2017   Amenorrhea 03/20/2017   Irritable bowel syndrome 01/01/2017   Status migrainosus 01/01/2017   Anxiety 01/01/2017   Vaginal discharge 12/17/2016   GERD (gastroesophageal reflux disease) 10/30/2016   Mixed hyperlipidemia 10/30/2016   Diabetes type 2, controlled (HCC) 09/12/2015   OSA (obstructive sleep apnea) 02/16/2015   Asthma 10/29/2011    PCP: Eartha Inch, MD  REFERRING PROVIDER: Tarry Kos, MD  REFERRING DIAG: [phone] (ICD-10-CM) - Status post total left knee replacement  Rationale for Evaluation and Treatment: Rehabilitation  THERAPY DIAG:  Muscle weakness (generalized)  Stiffness of left knee, not elsewhere classified  Localized edema  Acute pain of left knee  Chronic pain of left knee  ONSET DATE: DOS: 09/04/23   SUBJECTIVE:  SUBJECTIVE STATEMENT: 1/10 pain upon arrival today  PERTINENT HISTORY:  DM, asthma, GERD, HLD, OSA, anxiety, migraines.  PAIN:  NPRS scale: 1 today Pain location:all over but worse in anterior medial knee Pain description: constant, achy, sharp Aggravating factors: sitting too long, car rides Relieving factors: ice,  meds   PRECAUTIONS:  None  RED FLAGS: None   WEIGHT BEARING RESTRICTIONS:  No  FALLS:  Has patient fallen in last 6 months? Yes. Number of falls 2.Feel back in  bath[place]oom and fell back on toilet  OCCUPATION:  Nurse  PLOF:  Independent  PATIENT GOALS:  Reduce pain, get back to normal function, get rid of walker   OBJECTIVE:   DIAGNOSTIC FINDINGS:  09/04/23 XR IMPRESSION: Left knee arthroplasty without immediate postoperative complication.  PATIENT SURVEYS:  09/30/23 FOTO 40% functional, goal is 64%  COGNITIVE STATUS: Within functional limits for tasks assessed   SENSATION: WFL  GAIT: 09/30/23 Ambulates in with RW independently, does not use AD PLOF for community ambulation   EDEMA: 09/30/23  Knee joint line: Moderate edema noted in left knee   LOWER EXTREMITY ROM:     ROM Left eval Left 10/02/22 Left 10/09/2023 Left 10/22/23 Left 10/28/23  Knee flexion A: 90 P: 99 P:105 106 110 A 120 P   Knee extension A: 6 P: 4 A:3 -2 -1 A 0   (Blank rows = not tested)   LOWER EXTREMITY MMT:    MMT Right eval Left eval Left 11/07/23 Left 11/25/23  Knee flexion 5 4 5 5   Knee extension 5 4 4+ 5   (Blank rows = not tested)    TODAY'S TREATMENT:  11/26/23 TherEx Recumbent bike seat #3 full circles X 10 min L4  TherAct Double Leg Press 100# 3X10 x full extension (lock left knee out at the top) and stretch into flexion Single Leg Press bil 68# 3X10 x full extension (lock left knee out at the top) and stretch into flexion LLE RDL with 10# KB 2x10 Tandem walk 3 round trips in bars  Standing hip abd, 5# 2X10 bilat Standing H.S. curls 5# 2X10 each Step ups 6 inch step leading with left up and then down in front with right X 15 reps no UE support Lateral step ups 6 inch X 10 bilat, no UE support Seated knee extension machine left only 10# 2X10,  then DL 40#  2 X 10  06/01/10 TherEx Recumbent bike seat #3 full circles X 10 min L4  TherAct Double Leg Press 100# 3X10 x full extension (lock left knee out at the top) and stretch into flexion Single Leg Press bil 68# 3X10 x full extension (lock left knee out at the top) and stretch into  flexion LLE RDL with 10# KB 2x10 Sit to stands with 10# KB 2X10 (left foot slightly further back) Tandem walk 3 round trips in bars  SLS sliders X 10 forward, X 10 lateral, X 10 retro, (slider under Rt foot) Step ups 6 inch step leading with left up and then down in front with right X 12 reps no UE support Seated knee extension machine left only 10# 2X10,  then DL 91#  2 X 10 Seated hamstring curl machine DL 47# [id number]  03/14/29 TherEx Recumbent bike seat #3 full circles X 10 min L4  TherAct Double Leg Press 100# 2 X15 x full extension (lock left knee out at the top) and stretch into flexion Single Leg Press bil 68# 3X10 x full extension (lock left knee out  at the top) and stretch into flexion LLE RDL with 10# KB 2x10 Sit to stands with 10# KB 2X10 (left foot slightly further back) Tandem walk 3 round trips in bars  SLS sliders X 10 forward, X 10 lateral, X 10 retro, (slider under Rt foot) Step ups 6 inch step leading with left up and then down in front with right X 12 reps no UE support Seated knee extension machine left only 10# 2X10,  then DL 16#  2 X 10 Seated hamstring curl machine DL 10# [id number]     PATIENT EDUCATION: Education details: HEP, PT plan of care Person educated: Patient Education method: Explanation, Demonstration, Verbal cues, and Handouts Education comprehension: verbalized understanding and needs further education   HOME EXERCISE PROGRAM:  Access Code: [email or web address] URL: [email or web address] Date: 11/26/2023 Prepared by: Ivery Quale  Exercises - Supine Heel Slide with Strap  - 1 x daily - 3-4 x weekly - 1 sets - 10 reps - 5 hold - Standing Alternating Knee Flexion with Ankle Weights  - 1 x daily - 3-4 x weekly - 2-3 sets - 10 reps - Seated Long Arc Quad with Ankle Weight  - 1 x daily - 3-4 x weekly - 2-3 sets - 10 reps - Standing Hip Abduction with Counter Support  - 1 x daily - 3-4 x weekly - 2-3 sets - 10 reps - Sit to Stand Without Arm  Support  - 1 x daily - 3-4 x weekly - 2-3 sets - 10 reps ASSESSMENT:  CLINICAL IMPRESSION:  Overall doing well, she says MD has agreed to more PT visits as patient feels she needs more especially as she starts back to work. I did progress her HEP today to reflect her progress and add ones she can do with 5# ankle weights as she plans to buy some.  OBJECTIVE IMPAIRMENTS: decreased activity tolerance, difficulty walking, decreased balance, decreased endurance, decreased functional mobility, decreased ROM, decreased strength, impaired flexibility, impaired LE use, and pain.  ACTIVITY LIMITATIONS: bending, lifting, carry, locomotion, cleaning, community activity, driving, and or occupation  PERSONAL FACTORS: see above PMH are also affecting patient's functional outcome.  REHAB POTENTIAL: Excellent  CLINICAL DECISION MAKING: Stable/uncomplicated  EVALUATION COMPLEXITY: Low    GOALS: Short term PT Goals Target date: 10/28/2023  Pt will be I and compliant with HEP. Baseline:  Goal status: MET 10/07/23 Pt will decrease pain by 25% overall Baseline: Goal status: MET 11/18/22  Long term PT goals Target date:12/10/2023   Pt will improve left ROM 0-120 deg to improve functional mobility Baseline: Goal status: MET 11/18/22 Pt will improve  hip/knee strength to at least 5-/5 MMT to improve functional strength Baseline: Goal status: MET 11/18/22 Pt will improve FOTO to at least 64% functional to show improved function Baseline: Goal status: ongoing 11/19/23 Pt will reduce pain to overall less than 2-3/10 with usual activity and work activity. Baseline: Goal status: MET 11/18/22 Pt will be able to ambulate community distances at least 1000 ft WNL gait pattern without complaints Baseline: Goal status: ongoing 2/255/25  PLAN: PT FREQUENCY: 1-2 times per week   PT DURATION: 3 weeks  PLANNED INTERVENTIONS (unless contraindicated): aquatic PT, Canalith repositioning, cryotherapy, Electrical  stimulation, Iontophoresis with 4 mg/ml dexamethasome, Moist heat, traction, Ultrasound, gait training, Therapeutic exercise, balance training, neuromuscular re-education, patient/family education,manual techniques, passive ROM, dry needling, taping, vasopnuematic device, vestibular, spinal manipulations, joint manipulations 97110-Therapeutic exercises, 97530- Therapeutic activity, [id number]- Neuromuscular re-education, 97535- Self Care, and [id number]- Manual therapy  PLAN FOR  NEXT SESSION:  Recert for remaining few visits, planning to return back to work 12/02/23 balance and continue quadriceps strengthening and functional strenth, single leg stability with gait progressions to reduce limp as appropriate.   NEXT MD APPT: strength progressions as tolerated, how is transition back to work going.  Ivery Quale, PT, DPT 11/26/23 9:02 AM

## 2023-12-02 NOTE — Telephone Encounter (Signed)
 Ok to do

## 2023-12-04 DIAGNOSIS — G4733 Obstructive sleep apnea (adult) (pediatric): Secondary | ICD-10-CM | POA: Diagnosis not present

## 2023-12-06 ENCOUNTER — Other Ambulatory Visit (HOSPITAL_COMMUNITY): Payer: Self-pay

## 2023-12-07 ENCOUNTER — Other Ambulatory Visit: Payer: Self-pay | Admitting: Physician Assistant

## 2023-12-07 ENCOUNTER — Other Ambulatory Visit (HOSPITAL_COMMUNITY): Payer: Self-pay

## 2023-12-09 ENCOUNTER — Other Ambulatory Visit (HOSPITAL_COMMUNITY): Payer: Self-pay

## 2023-12-09 ENCOUNTER — Other Ambulatory Visit: Payer: Self-pay

## 2023-12-09 ENCOUNTER — Encounter: Payer: Self-pay | Admitting: Physical Therapy

## 2023-12-09 ENCOUNTER — Ambulatory Visit: Admitting: Physical Therapy

## 2023-12-09 DIAGNOSIS — R600 Localized edema: Secondary | ICD-10-CM

## 2023-12-09 DIAGNOSIS — M6281 Muscle weakness (generalized): Secondary | ICD-10-CM

## 2023-12-09 DIAGNOSIS — M25662 Stiffness of left knee, not elsewhere classified: Secondary | ICD-10-CM | POA: Diagnosis not present

## 2023-12-09 DIAGNOSIS — M25562 Pain in left knee: Secondary | ICD-10-CM

## 2023-12-09 MED ORDER — OMEPRAZOLE 40 MG PO CPDR
40.0000 mg | DELAYED_RELEASE_CAPSULE | Freq: Every day | ORAL | 1 refills | Status: DC
Start: 2023-12-09 — End: 2024-06-05
  Filled 2023-12-09: qty 90, 90d supply, fill #0
  Filled 2024-02-29: qty 90, 90d supply, fill #1

## 2023-12-09 NOTE — Therapy (Signed)
 OUTPATIENT PHYSICAL THERAPY TREATMENT   Patient Name: Julie Hansen MRN: [id number] DOB:[birth year], 53 y.o., female Today's Date: 12/09/2023  END OF SESSION:  PT End of Session - 12/09/23 1317     Visit Number 20    Number of Visits 23    Date for PT Re-Evaluation 12/10/23    Authorization Type [employer] $25 copay    PT Start Time 1313    PT Stop Time 1345    PT Time Calculation (min) 32 min    Activity Tolerance Patient tolerated treatment well    Behavior During Therapy WFL for tasks assessed/performed                 Past Medical History:  Diagnosis Date   Acid reflux    Anxiety    Asthma    Diabetes mellitus without complication (HCC)    Headache    migraines   Hyperlipidemia    Hypertension    Osteoarthritis of left knee    Palpitations    PCOS (polycystic ovarian syndrome)    PONV (postoperative nausea and vomiting)    Sleep apnea    uses cpap   Past Surgical History:  Procedure Laterality Date   BREAST BIOPSY Left 04/09/2023   BREAST BIOPSY Left 04/09/2023   [place] LT BREAST BX W LOC DEV 1ST LESION IMG BX SPEC US GUIDE 04/09/2023 [facility]   BREAST BIOPSY Left 04/17/2023   MM LT BREAST BX W LOC DEV 1ST LESION IMAGE BX SPEC STEREO GUIDE 04/17/2023 [facility]   BREAST EXCISIONAL BIOPSY Right 06/28/2020   papilloma   DILATION AND EVACUATION N/A 06/05/2017   Procedure: DILATATION AND EVACUATION;  Surgeon: Essie Hart, MD;  Location: [facility];  Service: Gynecology;  Laterality: N/A;   KNEE ARTHROSCOPY WITH MENISCAL REPAIR Left 09/20/2021   Procedure: LEFT KNEE PARTIAL MEDIAL MENISCECTOMY;  Surgeon: Tarry Kos, MD;  Location: [facility];  Service: Orthopedics;  Laterality: Left;   RADIOACTIVE SEED GUIDED EXCISIONAL BREAST BIOPSY Right 06/28/2020   Procedure: RIGHT RADIOACTIVE SEED GUIDED EXCISIONAL BREAST BIOPSY;  Surgeon: Emelia Loron, MD;  Location: [facility];  Service: General;  Laterality: Right;    TOTAL KNEE ARTHROPLASTY Left 09/04/2023   Procedure: LEFT TOTAL KNEE ARTHROPLASTY;  Surgeon: Tarry Kos, MD;  Location: [facility];  Service: Orthopedics;  Laterality: Left;   WISDOM TOOTH EXTRACTION     Patient Active Problem List   Diagnosis Date Noted   Status post total left knee replacement 09/04/2023   Chronic migraine without aura without status migrainosus, not intractable 04/14/2021   Muscle spasm 06/20/2018   Unspecified dyspareunia (CODE) 03/20/2017   Amenorrhea 03/20/2017   Irritable bowel syndrome 01/01/2017   Status migrainosus 01/01/2017   Anxiety 01/01/2017   Vaginal discharge 12/17/2016   GERD (gastroesophageal reflux disease) 10/30/2016   Mixed hyperlipidemia 10/30/2016   Diabetes type 2, controlled (HCC) 09/12/2015   OSA (obstructive sleep apnea) 02/16/2015   Asthma 10/29/2011    PCP: Eartha Inch, MD  REFERRING PROVIDER: Tarry Kos, MD  REFERRING DIAG: [phone] (ICD-10-CM) - Status post total left knee replacement  Rationale for Evaluation and Treatment: Rehabilitation  THERAPY DIAG:  Muscle weakness (generalized)  Stiffness of left knee, not elsewhere classified  Localized edema  Acute pain of left knee  ONSET DATE: DOS: 09/04/23   SUBJECTIVE:  SUBJECTIVE STATEMENT: She is back to working half days, feels she needs to work more on strength, her leg gives out on her at times.  PERTINENT HISTORY:  DM, asthma, GERD, HLD, OSA, anxiety, migraines.  PAIN:  NPRS scale: 1 today Pain location:all over but worse in anterior medial knee Pain description: constant, achy, sharp Aggravating factors: sitting too long, car rides Relieving factors: ice,  meds   PRECAUTIONS:  None  RED FLAGS: None   WEIGHT BEARING RESTRICTIONS:  No  FALLS:  Has patient fallen  in last 6 months? Yes. Number of falls 2.Feel back in bathrooom and fell back on toilet  OCCUPATION:  Nurse  PLOF:  Independent  PATIENT GOALS:  Reduce pain, get back to normal function, get rid of walker   OBJECTIVE:   DIAGNOSTIC FINDINGS:  09/04/23 XR IMPRESSION: Left knee arthroplasty without immediate postoperative complication.  PATIENT SURVEYS:  09/30/23 FOTO 40% functional, goal is 64%  COGNITIVE STATUS: Within functional limits for tasks assessed   SENSATION: WFL  GAIT: 09/30/23 Ambulates in with RW independently, does not use AD PLOF for community ambulation   EDEMA: 09/30/23  Knee joint line: Moderate edema noted in left knee   LOWER EXTREMITY ROM:     ROM Left eval Left 10/02/22 Left 10/09/2023 Left 10/22/23 Left 10/28/23  Knee flexion A: 90 P: 99 P:105 106 110 A 120 P   Knee extension A: 6 P: 4 A:3 -2 -1 A 0   (Blank rows = not tested)   LOWER EXTREMITY MMT:    MMT Right eval Left eval Left 11/07/23 Left 11/25/23  Knee flexion 5 4 5 5   Knee extension 5 4 4+ 5   (Blank rows = not tested)    TODAY'S TREATMENT:  12/09/23 TherEx Recumbent bike seat #3 full circles X 8 min L4  TherAct Double Leg Press 106# 3X10  Single Leg Press bil 68# 3X10LLE RDL with 10# KB 2x10 Single leg sliders  X10 forward, lateral, back, WB on left and slider under Rt TRX squats 2X10 TRX lunges/split squat,  X10 bilat Seated knee extension 5# 2X10 Standing hamstring curl 5# 2X10  11/26/23 TherEx Recumbent bike seat #3 full circles X 10 min L4  TherAct Double Leg Press 100# 3X10 x full extension (lock left knee out at the top) and stretch into flexion Single Leg Press bil 68# 3X10 x full extension (lock left knee out at the top) and stretch into flexion LLE RDL with 10# KB 2x10 Tandem walk 3 round trips in bars  Standing hip abd, 5# 2X10 bilat Standing H.S. curls 5# 2X10 each Step ups 6 inch step leading with left up and then down in front with right X 15 reps  no UE support Lateral step ups 6 inch X 10 bilat, no UE support Seated knee extension machine left only 10# 2X10,  then DL 82#  2 X 10  05/29/61 TherEx Recumbent bike seat #3 full circles X 10 min L4  TherAct Double Leg Press 100# 3X10 x full extension (lock left knee out at the top) and stretch into flexion Single Leg Press bil 68# 3X10 x full extension (lock left knee out at the top) and stretch into flexion LLE RDL with 10# KB 2x10 Sit to stands with 10# KB 2X10 (left foot slightly further back) Tandem walk 3 round trips in bars  SLS sliders X 10 forward, X 10 lateral, X 10 retro, (slider under Rt foot) Step ups 6 inch step leading with left up  and then down in front with right X 12 reps no UE support Seated knee extension machine left only 10# 2X10,  then DL 81#  2 X 10 Seated hamstring curl machine DL 19# [id number]    PATIENT EDUCATION: Education details: HEP, PT plan of care Person educated: Patient Education method: Explanation, Demonstration, Verbal cues, and Handouts Education comprehension: verbalized understanding and needs further education   HOME EXERCISE PROGRAM:  Access Code: [email or web address] URL: [email or web address] Date: 11/26/2023 Prepared by: Ivery Quale  Exercises - Supine Heel Slide with Strap  - 1 x daily - 3-4 x weekly - 1 sets - 10 reps - 5 hold - Standing Alternating Knee Flexion with Ankle Weights  - 1 x daily - 3-4 x weekly - 2-3 sets - 10 reps - Seated Long Arc Quad with Ankle Weight  - 1 x daily - 3-4 x weekly - 2-3 sets - 10 reps - Standing Hip Abduction with Counter Support  - 1 x daily - 3-4 x weekly - 2-3 sets - 10 reps - Sit to Stand Without Arm Support  - 1 x daily - 3-4 x weekly - 2-3 sets - 10 reps ASSESSMENT:  CLINICAL IMPRESSION:  Overall continues to do well but does lack some quad strength and endurance causing her leg to get tired and want to give out on her. This should improve with further strength work which was the focus of  session today.   OBJECTIVE IMPAIRMENTS: decreased activity tolerance, difficulty walking, decreased balance, decreased endurance, decreased functional mobility, decreased ROM, decreased strength, impaired flexibility, impaired LE use, and pain.  ACTIVITY LIMITATIONS: bending, lifting, carry, locomotion, cleaning, community activity, driving, and or occupation  PERSONAL FACTORS: see above PMH are also affecting patient's functional outcome.  REHAB POTENTIAL: Excellent  CLINICAL DECISION MAKING: Stable/uncomplicated  EVALUATION COMPLEXITY: Low    GOALS: Short term PT Goals Target date: 10/28/2023  Pt will be I and compliant with HEP. Baseline:  Goal status: MET 10/07/23 Pt will decrease pain by 25% overall Baseline: Goal status: MET 11/18/22  Long term PT goals Target date:12/10/2023   Pt will improve left ROM 0-120 deg to improve functional mobility Baseline: Goal status: MET 11/18/22 Pt will improve  hip/knee strength to at least 5-/5 MMT to improve functional strength Baseline: Goal status: MET 11/18/22 Pt will improve FOTO to at least 64% functional to show improved function Baseline: Goal status: ongoing 11/19/23 Pt will reduce pain to overall less than 2-3/10 with usual activity and work activity. Baseline: Goal status: MET 11/18/22 Pt will be able to ambulate community distances at least 1000 ft WNL gait pattern without complaints Baseline: Goal status: ongoing 2/255/25  PLAN: PT FREQUENCY: 1-2 times per week   PT DURATION: 3 weeks  PLANNED INTERVENTIONS (unless contraindicated): aquatic PT, Canalith repositioning, cryotherapy, Electrical stimulation, Iontophoresis with 4 mg/ml dexamethasome, Moist heat, traction, Ultrasound, gait training, Therapeutic exercise, balance training, neuromuscular re-education, patient/family education,manual techniques, passive ROM, dry needling, taping, vasopnuematic device, vestibular, spinal manipulations, joint  manipulations 97110-Therapeutic exercises, 97530- Therapeutic activity, 97112- Neuromuscular re-education, 97535- Self Care, and 29562- Manual therapy  PLAN FOR NEXT SESSION:  Recert for remaining few visits, planning to return back to work 12/02/23 balance and continue quadriceps strengthening and functional strenth, single leg stability with gait progressions to reduce limp as appropriate.   NEXT MD APPT: strength progressions as tolerated and squat progressions Ivery Quale, PT, DPT 12/09/23 1:21 PM

## 2023-12-11 ENCOUNTER — Encounter: Payer: Self-pay | Admitting: Physical Therapy

## 2023-12-11 ENCOUNTER — Other Ambulatory Visit: Payer: Self-pay | Admitting: General Surgery

## 2023-12-11 ENCOUNTER — Ambulatory Visit (INDEPENDENT_AMBULATORY_CARE_PROVIDER_SITE_OTHER): Payer: Self-pay | Admitting: Physical Therapy

## 2023-12-11 DIAGNOSIS — M25562 Pain in left knee: Secondary | ICD-10-CM

## 2023-12-11 DIAGNOSIS — N6012 Diffuse cystic mastopathy of left breast: Secondary | ICD-10-CM

## 2023-12-11 DIAGNOSIS — N6321 Unspecified lump in the left breast, upper outer quadrant: Secondary | ICD-10-CM | POA: Diagnosis not present

## 2023-12-11 DIAGNOSIS — R600 Localized edema: Secondary | ICD-10-CM

## 2023-12-11 DIAGNOSIS — M25662 Stiffness of left knee, not elsewhere classified: Secondary | ICD-10-CM

## 2023-12-11 DIAGNOSIS — M6281 Muscle weakness (generalized): Secondary | ICD-10-CM | POA: Diagnosis not present

## 2023-12-11 NOTE — Therapy (Addendum)
 OUTPATIENT PHYSICAL THERAPY TREATMENT PHYSICAL THERAPY DISCHARGE SUMMARY  Visits from Start of Care: 21  Current functional level related to goals / functional outcomes: See below   Remaining deficits: See below   Education / Equipment: HEP  Plan:  Patient goals were  met. Patient is being discharged due to not returning since last visit >30 days  Redell Moose, PT, DPT 03/30/24 9:17 AM     Patient Name: Julie Hansen MRN: [id number] DOB:[birth year], 53 y.o., female Today's Date: 12/11/2023  END OF SESSION:  PT End of Session - 12/11/23 0807     Visit Number 21    Number of Visits 23    Date for PT Re-Evaluation 12/10/23    Authorization Type [employer] $25 copay    PT Start Time 0803    PT Stop Time 0851    PT Time Calculation (min) 48 min    Activity Tolerance Patient tolerated treatment well    Behavior During Therapy [facility] for tasks assessed/performed                 Past Medical History:  Diagnosis Date   Acid reflux    Anxiety    Asthma    Diabetes mellitus without complication (HCC)    Headache    migraines   Hyperlipidemia    Hypertension    Osteoarthritis of left knee    Palpitations    PCOS (polycystic ovarian syndrome)    PONV (postoperative nausea and vomiting)    Sleep apnea    uses cpap   Past Surgical History:  Procedure Laterality Date   BREAST BIOPSY Left 04/09/2023   BREAST BIOPSY Left 04/09/2023   US  LT BREAST BX W LOC DEV 1ST LESION IMG BX SPEC US  GUIDE 04/09/2023 [facility]   BREAST BIOPSY Left 04/17/2023   MM LT BREAST BX W LOC DEV 1ST LESION IMAGE BX SPEC STEREO GUIDE 04/17/2023 [facility]   BREAST EXCISIONAL BIOPSY Right 06/28/2020   papilloma   DILATION AND EVACUATION N/A 06/05/2017   Procedure: DILATATION AND EVACUATION;  Surgeon: Bettina Muskrat, MD;  Location: [facility];  Service: Gynecology;  Laterality: N/A;   KNEE ARTHROSCOPY WITH MENISCAL REPAIR Left 09/20/2021   Procedure: LEFT KNEE PARTIAL MEDIAL  MENISCECTOMY;  Surgeon: Jerri Kay HERO, MD;  Location: [facility];  Service: Orthopedics;  Laterality: Left;   RADIOACTIVE SEED GUIDED EXCISIONAL BREAST BIOPSY Right 06/28/2020   Procedure: RIGHT RADIOACTIVE SEED GUIDED EXCISIONAL BREAST BIOPSY;  Surgeon: Ebbie Cough, MD;  Location: [facility];  Service: General;  Laterality: Right;   TOTAL KNEE ARTHROPLASTY Left 09/04/2023   Procedure: LEFT TOTAL KNEE ARTHROPLASTY;  Surgeon: Jerri Kay HERO, MD;  Location: [facility];  Service: Orthopedics;  Laterality: Left;   WISDOM TOOTH EXTRACTION     Patient Active Problem List   Diagnosis Date Noted   Status post total left knee replacement 09/04/2023   Chronic migraine without aura without status migrainosus, not intractable 04/14/2021   Muscle spasm 06/20/2018   Unspecified dyspareunia (CODE) 03/20/2017   Amenorrhea 03/20/2017   Irritable bowel syndrome 01/01/2017   Status migrainosus 01/01/2017   Anxiety 01/01/2017   Vaginal discharge 12/17/2016   GERD (gastroesophageal reflux disease) 10/30/2016   Mixed hyperlipidemia 10/30/2016   Diabetes type 2, controlled (HCC) 09/12/2015   OSA (obstructive sleep apnea) 02/16/2015   Asthma 10/29/2011    PCP: Sophronia Ozell BROCKS, MD  REFERRING PROVIDER: Jerri Kay HERO, MD  REFERRING DIAG: [phone] (ICD-10-CM) - Status post total left knee replacement  Rationale for Evaluation and Treatment: Rehabilitation  THERAPY DIAG:  Muscle weakness (generalized)  Acute pain of left knee  Stiffness of left knee, not elsewhere classified  Localized edema  ONSET DATE: DOS: 09/04/23   SUBJECTIVE:                                                                                                                                                                                           SUBJECTIVE STATEMENT: relays leg soreness but not really pain in her knee.  PERTINENT HISTORY:  DM, asthma, GERD, HLD, OSA, anxiety, migraines.  PAIN:   NPRS scale: 1 today Pain location:all over but worse in anterior medial knee Pain description: constant, achy, sharp Aggravating factors: sitting too long, car rides Relieving factors: ice,  meds   PRECAUTIONS:  None  RED FLAGS: None   WEIGHT BEARING RESTRICTIONS:  No  FALLS:  Has patient fallen in last 6 months? Yes. Number of falls 2.Feel back in bathrooom and fell back on toilet  OCCUPATION:  Nurse  PLOF:  Independent  PATIENT GOALS:  Reduce pain, get back to normal function, get rid of walker   OBJECTIVE:   DIAGNOSTIC FINDINGS:  09/04/23 XR IMPRESSION: Left knee arthroplasty without immediate postoperative complication.  PATIENT SURVEYS:  09/30/23 FOTO 40% functional, goal is 64%  COGNITIVE STATUS: Within functional limits for tasks assessed   SENSATION: WFL  GAIT: 09/30/23 Ambulates in with RW independently, does not use AD PLOF for community ambulation   EDEMA: 09/30/23  Knee joint line: Moderate edema noted in left knee   LOWER EXTREMITY ROM:     ROM Left eval Left 10/02/22 Left 10/09/2023 Left 10/22/23 Left 10/28/23 Left 12/11/23  Knee flexion A: 90 P: 99 P:105 106 110 A 120 P    Knee extension A: 6 P: 4 A:3 -2 -1 A 0    (Blank rows = not tested)   LOWER EXTREMITY MMT:    MMT Right eval Left eval Left 11/07/23 Left 11/25/23  Knee flexion 5 4 5 5   Knee extension 5 4 4+ 5   (Blank rows = not tested)    TODAY'S TREATMENT:  12/11/23 TherEx Recumbent bike seat #3 full circles X 8 min L4  TherAct Double Leg Press 100# 3X10  Single Leg Press bil 50# 3X10 Step ups forward 6 inch step with left leg and down in front with right leg X 10 without UE support Lateral step ups X 10 bilat without UE support Sit to stand 10# 2X10 Single leg sliders  X10 forward, lateral, back, WB on left and slider under Rt Seated knee extension 5# 3X10 Standing hamstring curl  5# 2X10 Standing hip abduction 5# 2X10  -Vasopnuematic device X 10 min, medium  compression, 34 deg to Lt knee   12/09/23 TherEx Recumbent bike seat #3 full circles X 8 min L4  TherAct Double Leg Press 106# 3X10  Single Leg Press bil 68# 3X10LLE RDL with 10# KB 2x10 Single leg sliders  X10 forward, lateral, back, WB on left and slider under Rt TRX squats 2X10 TRX lunges/split squat,  X10 bilat Seated knee extension 5# 2X10 Standing hamstring curl 5# 2X10  11/26/23 TherEx Recumbent bike seat #3 full circles X 10 min L4  TherAct Double Leg Press 100# 3X10 x full extension (lock left knee out at the top) and stretch into flexion Single Leg Press bil 68# 3X10 x full extension (lock left knee out at the top) and stretch into flexion LLE RDL with 10# KB 2x10 Tandem walk 3 round trips in bars  Standing hip abd, 5# 2X10 bilat Standing H.S. curls 5# 2X10 each Step ups 6 inch step leading with left up and then down in front with right X 15 reps no UE support Lateral step ups 6 inch X 10 bilat, no UE support Seated knee extension machine left only 10# 2X10,  then DL 79#  2 X 10  02/27/73 TherEx Recumbent bike seat #3 full circles X 10 min L4  TherAct Double Leg Press 100# 3X10 x full extension (lock left knee out at the top) and stretch into flexion Single Leg Press bil 68# 3X10 x full extension (lock left knee out at the top) and stretch into flexion LLE RDL with 10# KB 2x10 Sit to stands with 10# KB 2X10 (left foot slightly further back) Tandem walk 3 round trips in bars  SLS sliders X 10 forward, X 10 lateral, X 10 retro, (slider under Rt foot) Step ups 6 inch step leading with left up and then down in front with right X 12 reps no UE support Seated knee extension machine left only 10# 2X10,  then DL 79#  2 X 10 Seated hamstring curl machine DL 74# [id number]    PATIENT EDUCATION: Education details: HEP, PT plan of care Person educated: Patient Education method: Explanation, Demonstration, Verbal cues, and Handouts Education comprehension: verbalized  understanding and needs further education   HOME EXERCISE PROGRAM:  Access Code: [email or web address] URL: [email or web address] Date: 11/26/2023 Prepared by: Redell Moose  Exercises - Supine Heel Slide with Strap  - 1 x daily - 3-4 x weekly - 1 sets - 10 reps - 5 hold - Standing Alternating Knee Flexion with Ankle Weights  - 1 x daily - 3-4 x weekly - 2-3 sets - 10 reps - Seated Long Arc Quad with Ankle Weight  - 1 x daily - 3-4 x weekly - 2-3 sets - 10 reps - Standing Hip Abduction with Counter Support  - 1 x daily - 3-4 x weekly - 2-3 sets - 10 reps - Sit to Stand Without Arm Support  - 1 x daily - 3-4 x weekly - 2-3 sets - 10 reps ASSESSMENT:  CLINICAL IMPRESSION:  Increased leg soreness from strength progressions last time so I did back down her strengthening some today to accommodate this as well as used vasopnuematic at end. Her knee did not hurt which is promising and soreness should resolve as well as improve as she improves strength.    OBJECTIVE IMPAIRMENTS: decreased activity tolerance, difficulty walking, decreased balance, decreased endurance, decreased functional mobility, decreased ROM, decreased strength, impaired flexibility, impaired LE use,  and pain.  ACTIVITY LIMITATIONS: bending, lifting, carry, locomotion, cleaning, community activity, driving, and or occupation  PERSONAL FACTORS: see above PMH are also affecting patient's functional outcome.  REHAB POTENTIAL: Excellent  CLINICAL DECISION MAKING: Stable/uncomplicated  EVALUATION COMPLEXITY: Low    GOALS: Short term PT Goals Target date: 10/28/2023  Pt will be I and compliant with HEP. Baseline:  Goal status: MET 10/07/23 Pt will decrease pain by 25% overall Baseline: Goal status: MET 11/18/22  Long term PT goals Target date:12/10/2023   Pt will improve left ROM 0-120 deg to improve functional mobility Baseline: Goal status: MET 11/18/22 Pt will improve  hip/knee strength to at least 5-/5 MMT to  improve functional strength Baseline: Goal status: MET 11/18/22 Pt will improve FOTO to at least 64% functional to show improved function Baseline: Goal status: ongoing 11/19/23 Pt will reduce pain to overall less than 2-3/10 with usual activity and work activity. Baseline: Goal status: MET 11/18/22 Pt will be able to ambulate community distances at least 1000 ft WNL gait pattern without complaints Baseline: Goal status: ongoing 2/255/25  PLAN: PT FREQUENCY: 1-2 times per week   PT DURATION: 3 weeks  PLANNED INTERVENTIONS (unless contraindicated): aquatic PT, Canalith repositioning, cryotherapy, Electrical stimulation, Iontophoresis with 4 mg/ml dexamethasome, Moist heat, traction, Ultrasound, gait training, Therapeutic exercise, balance training, neuromuscular re-education, patient/family education,manual techniques, passive ROM, dry needling, taping, vasopnuematic device, vestibular, spinal manipulations, joint manipulations 97110-Therapeutic exercises, 97530- Therapeutic activity, 97112- Neuromuscular re-education, 97535- Self Care, and [id number]- Manual therapy  PLAN FOR NEXT SESSION:  Recert for remaining few visits, planning to return back to work 12/02/23 balance and continue quadriceps strengthening and functional strenth, single leg stability with gait progressions to reduce limp as appropriate.   NEXT MD APPT: strength progressions as tolerated and squat progressions with TRX Redell Moose, PT, DPT 12/11/23 8:36 AM

## 2023-12-12 ENCOUNTER — Other Ambulatory Visit (HOSPITAL_COMMUNITY): Payer: Self-pay

## 2023-12-12 DIAGNOSIS — G4733 Obstructive sleep apnea (adult) (pediatric): Secondary | ICD-10-CM | POA: Diagnosis not present

## 2023-12-12 MED ORDER — ALPRAZOLAM 1 MG PO TABS
0.5000 mg | ORAL_TABLET | Freq: Two times a day (BID) | ORAL | 0 refills | Status: DC
  Filled 2023-12-12: qty 60, 30d supply, fill #0

## 2023-12-13 ENCOUNTER — Other Ambulatory Visit: Payer: Self-pay

## 2023-12-13 ENCOUNTER — Other Ambulatory Visit: Payer: Self-pay | Admitting: General Surgery

## 2023-12-13 DIAGNOSIS — N6012 Diffuse cystic mastopathy of left breast: Secondary | ICD-10-CM

## 2023-12-20 ENCOUNTER — Other Ambulatory Visit (HOSPITAL_COMMUNITY): Payer: Self-pay

## 2023-12-20 MED ORDER — METOPROLOL SUCCINATE ER 50 MG PO TB24
50.0000 mg | ORAL_TABLET | Freq: Every day | ORAL | 1 refills | Status: DC
  Filled 2023-12-20 – 2024-02-29 (×3): qty 90, 90d supply, fill #0
  Filled 2024-05-11: qty 90, 90d supply, fill #1

## 2024-01-01 ENCOUNTER — Other Ambulatory Visit: Payer: Self-pay

## 2024-01-01 ENCOUNTER — Other Ambulatory Visit: Payer: Self-pay | Admitting: Physician Assistant

## 2024-01-01 ENCOUNTER — Other Ambulatory Visit (HOSPITAL_COMMUNITY): Payer: Self-pay

## 2024-01-01 ENCOUNTER — Encounter: Payer: Self-pay | Admitting: Orthopaedic Surgery

## 2024-01-01 MED ORDER — MELOXICAM 15 MG PO TABS
15.0000 mg | ORAL_TABLET | Freq: Every day | ORAL | 1 refills | Status: DC
  Filled 2024-01-01 – 2024-01-06 (×2): qty 30, 30d supply, fill #0
  Filled 2024-02-03: qty 30, 30d supply, fill #1

## 2024-01-01 MED ORDER — CYCLOBENZAPRINE HCL 10 MG PO TABS
10.0000 mg | ORAL_TABLET | Freq: Three times a day (TID) | ORAL | 1 refills | Status: DC
  Filled 2024-01-01: qty 30, 10d supply, fill #0
  Filled 2024-01-31: qty 30, 10d supply, fill #1

## 2024-01-02 ENCOUNTER — Other Ambulatory Visit: Payer: Self-pay | Admitting: Gastroenterology

## 2024-01-02 ENCOUNTER — Other Ambulatory Visit (HOSPITAL_COMMUNITY): Payer: Self-pay

## 2024-01-02 ENCOUNTER — Other Ambulatory Visit: Payer: Self-pay | Admitting: Physician Assistant

## 2024-01-02 ENCOUNTER — Other Ambulatory Visit: Payer: Self-pay

## 2024-01-02 DIAGNOSIS — K5730 Diverticulosis of large intestine without perforation or abscess without bleeding: Secondary | ICD-10-CM | POA: Diagnosis not present

## 2024-01-02 DIAGNOSIS — K760 Fatty (change of) liver, not elsewhere classified: Secondary | ICD-10-CM | POA: Diagnosis not present

## 2024-01-02 DIAGNOSIS — K219 Gastro-esophageal reflux disease without esophagitis: Secondary | ICD-10-CM | POA: Diagnosis not present

## 2024-01-02 DIAGNOSIS — Z1211 Encounter for screening for malignant neoplasm of colon: Secondary | ICD-10-CM | POA: Diagnosis not present

## 2024-01-02 DIAGNOSIS — R110 Nausea: Secondary | ICD-10-CM | POA: Diagnosis not present

## 2024-01-02 DIAGNOSIS — K5904 Chronic idiopathic constipation: Secondary | ICD-10-CM | POA: Diagnosis not present

## 2024-01-02 MED ORDER — HYDROCODONE-ACETAMINOPHEN 5-325 MG PO TABS
1.0000 | ORAL_TABLET | Freq: Every day | ORAL | 0 refills | Status: DC | PRN
  Filled 2024-01-02: qty 10, 10d supply, fill #0

## 2024-01-02 MED ORDER — TRULANCE 3 MG PO TABS
3.0000 mg | ORAL_TABLET | Freq: Every day | ORAL | 4 refills | Status: AC
  Filled 2024-01-02 – 2024-03-13 (×2): qty 90, 90d supply, fill #0
  Filled 2024-08-15: qty 90, 90d supply, fill #1
  Filled 2024-08-17: qty 30, 30d supply, fill #1
  Filled 2024-09-14 – 2024-09-15 (×2): qty 90, 90d supply, fill #1
  Filled 2024-10-27: qty 30, 30d supply, fill #1

## 2024-01-02 NOTE — Telephone Encounter (Signed)
 I sent in one small rx for norco 5.  This will have to be the last narcotic rx we can write as she is 4 months po.  Will have to wean to tramadol or tylenol 3 next or either refer to pain mgmt.

## 2024-01-03 ENCOUNTER — Other Ambulatory Visit (HOSPITAL_COMMUNITY): Payer: Self-pay

## 2024-01-04 DIAGNOSIS — G4733 Obstructive sleep apnea (adult) (pediatric): Secondary | ICD-10-CM | POA: Diagnosis not present

## 2024-01-06 ENCOUNTER — Other Ambulatory Visit: Payer: Self-pay

## 2024-01-06 ENCOUNTER — Encounter (HOSPITAL_BASED_OUTPATIENT_CLINIC_OR_DEPARTMENT_OTHER): Payer: Self-pay | Admitting: General Surgery

## 2024-01-06 ENCOUNTER — Other Ambulatory Visit (HOSPITAL_COMMUNITY): Payer: Self-pay

## 2024-01-06 MED ORDER — GOLYTELY 236 G PO SOLR
ORAL | 0 refills | Status: DC
  Filled 2024-01-06: qty 4000, 1d supply, fill #0

## 2024-01-07 ENCOUNTER — Other Ambulatory Visit (HOSPITAL_COMMUNITY): Payer: Self-pay

## 2024-01-08 ENCOUNTER — Other Ambulatory Visit (HOSPITAL_COMMUNITY): Payer: Self-pay

## 2024-01-08 ENCOUNTER — Other Ambulatory Visit: Payer: Self-pay

## 2024-01-08 MED ORDER — ZOLPIDEM TARTRATE ER 12.5 MG PO TBCR
EXTENDED_RELEASE_TABLET | ORAL | 0 refills | Status: DC
  Filled 2024-01-08: qty 90, 90d supply, fill #0

## 2024-01-09 ENCOUNTER — Other Ambulatory Visit (HOSPITAL_COMMUNITY): Payer: Self-pay

## 2024-01-09 ENCOUNTER — Encounter (HOSPITAL_BASED_OUTPATIENT_CLINIC_OR_DEPARTMENT_OTHER)
Admission: RE | Admit: 2024-01-09 | Discharge: 2024-01-09 | Disposition: A | Discharge status: Home / Self Care | Source: Ambulatory Visit | Attending: General Surgery | Admitting: General Surgery

## 2024-01-09 DIAGNOSIS — Z01812 Encounter for preprocedural laboratory examination: Secondary | ICD-10-CM | POA: Insufficient documentation

## 2024-01-09 DIAGNOSIS — I10 Essential (primary) hypertension: Secondary | ICD-10-CM | POA: Insufficient documentation

## 2024-01-09 DIAGNOSIS — E119 Type 2 diabetes mellitus without complications: Secondary | ICD-10-CM | POA: Diagnosis not present

## 2024-01-09 LAB — BASIC METABOLIC PANEL WITH GFR
Anion gap: 5 (ref 5–15)
BUN: 11 mg/dL (ref 6–20)
CO2: 26 mmol/L (ref 22–32)
Calcium: 8.8 mg/dL — ABNORMAL LOW (ref 8.9–10.3)
Chloride: 105 mmol/L (ref 98–111)
Creatinine, Ser: 0.77 mg/dL (ref 0.44–1.00)
GFR, Estimated: >60 mL/min (ref >60)
Glucose, Bld: 109 mg/dL — ABNORMAL HIGH (ref 70–99)
Potassium: 4.3 mmol/L (ref 3.5–5.1)
Sodium: 136 mmol/L (ref 135–145)

## 2024-01-09 LAB — POCT PREGNANCY, URINE: Preg Test, Ur: NEGATIVE

## 2024-01-09 MED ORDER — ENSURE PRE-SURGERY PO LIQD
296.0000 mL | Freq: Once | ORAL | Status: DC
Start: 2024-01-10 — End: 2024-01-13

## 2024-01-09 MED ORDER — CHLORHEXIDINE GLUCONATE CLOTH 2 % EX PADS: 6.0000 | MEDICATED_PAD | Freq: Once | CUTANEOUS | Status: DC

## 2024-01-09 NOTE — Progress Notes (Signed)

## 2024-01-10 ENCOUNTER — Other Ambulatory Visit (HOSPITAL_COMMUNITY): Payer: Self-pay

## 2024-01-10 ENCOUNTER — Ambulatory Visit
Admission: RE | Admit: 2024-01-10 | Discharge: 2024-01-10 | Disposition: A | Discharge status: Home / Self Care | Source: Ambulatory Visit | Attending: General Surgery | Admitting: General Surgery

## 2024-01-10 ENCOUNTER — Other Ambulatory Visit: Payer: Self-pay | Admitting: General Surgery

## 2024-01-10 DIAGNOSIS — N6012 Diffuse cystic mastopathy of left breast: Secondary | ICD-10-CM

## 2024-01-10 DIAGNOSIS — E559 Vitamin D deficiency, unspecified: Secondary | ICD-10-CM | POA: Diagnosis not present

## 2024-01-10 DIAGNOSIS — D242 Benign neoplasm of left breast: Secondary | ICD-10-CM | POA: Diagnosis not present

## 2024-01-10 DIAGNOSIS — I10 Essential (primary) hypertension: Secondary | ICD-10-CM | POA: Diagnosis not present

## 2024-01-10 DIAGNOSIS — E538 Deficiency of other specified B group vitamins: Secondary | ICD-10-CM | POA: Diagnosis not present

## 2024-01-10 DIAGNOSIS — F419 Anxiety disorder, unspecified: Secondary | ICD-10-CM | POA: Diagnosis not present

## 2024-01-10 DIAGNOSIS — R252 Cramp and spasm: Secondary | ICD-10-CM | POA: Diagnosis not present

## 2024-01-10 HISTORY — PX: BREAST BIOPSY: SHX20

## 2024-01-10 MED ORDER — VALSARTAN-HYDROCHLOROTHIAZIDE 160-12.5 MG PO TABS
1.0000 | ORAL_TABLET | Freq: Every day | ORAL | 3 refills | Status: AC
  Filled 2024-01-10: qty 90, 90d supply, fill #0
  Filled 2024-05-11: qty 90, 90d supply, fill #1
  Filled 2024-08-08 – 2024-09-01 (×3): qty 90, 90d supply, fill #2

## 2024-01-10 MED ORDER — ALPRAZOLAM 1 MG PO TABS
ORAL_TABLET | ORAL | 0 refills | Status: DC
Start: 2024-01-10 — End: 2024-03-02
  Filled 2024-01-10: qty 60, 30d supply, fill #0

## 2024-01-11 ENCOUNTER — Other Ambulatory Visit (HOSPITAL_COMMUNITY): Payer: Self-pay

## 2024-01-12 NOTE — Anesthesia Preprocedure Evaluation (Signed)
 Anesthesia Evaluation  Patient identified by MRN, date of birth, ID band Patient awake    Reviewed: Allergy & Precautions, NPO status , Patient's Chart, lab work & pertinent test results  History of Anesthesia Complications (+) PONV and history of anesthetic complications  Airway Mallampati: III  TM Distance: >3 FB Neck ROM: Full    Dental  (+) Dental Advisory Given,  Has top and bottom braces with a loose bracket on the top left side:   Pulmonary neg shortness of breath, asthma , sleep apnea , neg COPD, neg recent URI   Pulmonary exam normal breath sounds clear to auscultation       Cardiovascular hypertension (metoprolol , valsartan -HCTZ), Pt. on home beta blockers and Pt. on medications (-) angina (-) Past MI, (-) Cardiac Stents and (-) CABG + dysrhythmias (palpitations)  Rhythm:Regular Rate:Normal  HLD  TTE 09/18/2022: IMPRESSIONS    1. Left ventricular ejection fraction, by estimation, is 60 to 65%. The  left ventricle has normal function. The left ventricle has no regional  wall motion abnormalities. Left ventricular diastolic parameters were  normal.   2. Right ventricular systolic function is normal. The right ventricular  size is normal.   3. The mitral valve is normal in structure. No evidence of mitral valve  regurgitation. No evidence of mitral stenosis.   4. The aortic valve is normal in structure. Aortic valve regurgitation is  not visualized. No aortic stenosis is present.   5. The inferior vena cava is normal in size with greater than 50%  respiratory variability, suggesting right atrial pressure of 3 mmHg.     Neuro/Psych  Headaches, neg Seizures PSYCHIATRIC DISORDERS Anxiety        GI/Hepatic Neg liver ROS,GERD  Medicated,,  Endo/Other  diabetes, Type 2, Insulin  Dependent    Renal/GU negative Renal ROS     Musculoskeletal  (+) Arthritis , Osteoarthritis,    Abdominal  (+) + obese  Peds   Hematology negative hematology ROS (+) Lab Results      Component                Value               Date                      WBC                      7.7                 08/30/2023                HGB                      11.6 (L)            09/04/2023                HCT                      34.0 (L)            09/04/2023                MCV                      85.4                08/30/2023  PLT                      509 (H)             08/30/2023              Anesthesia Other Findings Last Farxiga : 01/12/2024  Last Mounjaro : 01/04/2024  Reproductive/Obstetrics                             Anesthesia Physical Anesthesia Plan  ASA: 3  Anesthesia Plan: General   Post-op Pain Management: Tylenol  PO (pre-op)*   Induction: Intravenous  PONV Risk Score and Plan: 4 or greater and Ondansetron , Dexamethasone , Propofol  infusion, TIVA, Midazolam  and Treatment may vary due to age or medical condition  Airway Management Planned: LMA  Additional Equipment:   Intra-op Plan:   Post-operative Plan: Extubation in OR  Informed Consent: I have reviewed the patients History and Physical, chart, labs and discussed the procedure including the risks, benefits and alternatives for the proposed anesthesia with the patient or authorized representative who has indicated his/her understanding and acceptance.     Dental advisory given  Plan Discussed with: CRNA  Anesthesia Plan Comments: (Risks of general anesthesia discussed including, but not limited to, sore throat, hoarse voice, chipped/damaged teeth, injury to vocal cords, nausea and vomiting, allergic reactions, lung infection, heart attack, stroke, and death. All questions answered. )       Anesthesia Quick Evaluation

## 2024-01-13 ENCOUNTER — Encounter (HOSPITAL_BASED_OUTPATIENT_CLINIC_OR_DEPARTMENT_OTHER)
Admission: RE | Disposition: A | Discharge status: Home / Self Care | Payer: Self-pay | Source: Home / Self Care | Attending: General Surgery

## 2024-01-13 ENCOUNTER — Ambulatory Visit
Admission: RE | Admit: 2024-01-13 | Discharge: 2024-01-13 | Disposition: A | Discharge status: Home / Self Care | Source: Ambulatory Visit | Attending: General Surgery | Admitting: General Surgery

## 2024-01-13 ENCOUNTER — Ambulatory Visit (HOSPITAL_BASED_OUTPATIENT_CLINIC_OR_DEPARTMENT_OTHER): Admitting: Anesthesiology

## 2024-01-13 ENCOUNTER — Other Ambulatory Visit: Payer: Self-pay

## 2024-01-13 ENCOUNTER — Encounter (HOSPITAL_BASED_OUTPATIENT_CLINIC_OR_DEPARTMENT_OTHER): Payer: Self-pay | Admitting: General Surgery

## 2024-01-13 ENCOUNTER — Ambulatory Visit (HOSPITAL_BASED_OUTPATIENT_CLINIC_OR_DEPARTMENT_OTHER)
Admission: RE | Admit: 2024-01-13 | Discharge: 2024-01-13 | Disposition: A | Discharge status: Home / Self Care | Attending: General Surgery | Admitting: General Surgery

## 2024-01-13 DIAGNOSIS — J45909 Unspecified asthma, uncomplicated: Secondary | ICD-10-CM

## 2024-01-13 DIAGNOSIS — I10 Essential (primary) hypertension: Secondary | ICD-10-CM | POA: Diagnosis not present

## 2024-01-13 DIAGNOSIS — D242 Benign neoplasm of left breast: Secondary | ICD-10-CM | POA: Insufficient documentation

## 2024-01-13 DIAGNOSIS — K219 Gastro-esophageal reflux disease without esophagitis: Secondary | ICD-10-CM | POA: Diagnosis not present

## 2024-01-13 DIAGNOSIS — G4730 Sleep apnea, unspecified: Secondary | ICD-10-CM | POA: Diagnosis not present

## 2024-01-13 DIAGNOSIS — N6321 Unspecified lump in the left breast, upper outer quadrant: Secondary | ICD-10-CM

## 2024-01-13 DIAGNOSIS — Z7984 Long term (current) use of oral hypoglycemic drugs: Secondary | ICD-10-CM | POA: Diagnosis not present

## 2024-01-13 DIAGNOSIS — N6022 Fibroadenosis of left breast: Secondary | ICD-10-CM | POA: Diagnosis not present

## 2024-01-13 DIAGNOSIS — E119 Type 2 diabetes mellitus without complications: Secondary | ICD-10-CM | POA: Insufficient documentation

## 2024-01-13 DIAGNOSIS — N6320 Unspecified lump in the left breast, unspecified quadrant: Secondary | ICD-10-CM | POA: Diagnosis not present

## 2024-01-13 DIAGNOSIS — Z79899 Other long term (current) drug therapy: Secondary | ICD-10-CM | POA: Insufficient documentation

## 2024-01-13 DIAGNOSIS — Z803 Family history of malignant neoplasm of breast: Secondary | ICD-10-CM | POA: Diagnosis not present

## 2024-01-13 DIAGNOSIS — Z794 Long term (current) use of insulin: Secondary | ICD-10-CM | POA: Insufficient documentation

## 2024-01-13 DIAGNOSIS — I499 Cardiac arrhythmia, unspecified: Secondary | ICD-10-CM | POA: Insufficient documentation

## 2024-01-13 DIAGNOSIS — N6012 Diffuse cystic mastopathy of left breast: Secondary | ICD-10-CM | POA: Diagnosis not present

## 2024-01-13 DIAGNOSIS — F419 Anxiety disorder, unspecified: Secondary | ICD-10-CM | POA: Diagnosis not present

## 2024-01-13 HISTORY — PX: RADIOACTIVE SEED GUIDED EXCISIONAL BREAST BIOPSY: SHX6490

## 2024-01-13 HISTORY — PX: RADIOACTIVE SEED GUIDED AXILLARY SENTINEL LYMPH NODE: SHX6735

## 2024-01-13 LAB — GLUCOSE, CAPILLARY
Glucose-Capillary: 141 mg/dL — ABNORMAL HIGH (ref 70–99)
Glucose-Capillary: 125 mg/dL — ABNORMAL HIGH (ref 70–99)

## 2024-01-13 SURGERY — RADIOACTIVE SEED GUIDED AXILLARY SENTINEL LYMPH NODE BIOPSY
Anesthesia: General | Site: Breast | Laterality: Left

## 2024-01-13 MED ORDER — LIDOCAINE HCL (CARDIAC) PF 100 MG/5ML IV SOSY
PREFILLED_SYRINGE | INTRAVENOUS | Status: DC | PRN
Start: 2024-01-13 — End: 2024-01-13
  Administered 2024-01-13: 100 mg via INTRAVENOUS

## 2024-01-13 MED ORDER — ONDANSETRON HCL 4 MG/2ML IJ SOLN
INTRAMUSCULAR | Status: AC
  Filled 2024-01-13: qty 2

## 2024-01-13 MED ORDER — MIDAZOLAM HCL 2 MG/2ML IJ SOLN
INTRAMUSCULAR | Status: AC
  Filled 2024-01-13: qty 2

## 2024-01-13 MED ORDER — FENTANYL CITRATE (PF) 100 MCG/2ML IJ SOLN
INTRAMUSCULAR | Status: AC
Start: 2024-01-13 — End: ?
  Filled 2024-01-13: qty 2

## 2024-01-13 MED ORDER — DEXAMETHASONE SODIUM PHOSPHATE 10 MG/ML IJ SOLN
INTRAMUSCULAR | Status: AC
  Filled 2024-01-13: qty 1

## 2024-01-13 MED ORDER — FENTANYL CITRATE (PF) 100 MCG/2ML IJ SOLN
INTRAMUSCULAR | Status: AC
  Filled 2024-01-13: qty 2

## 2024-01-13 MED ORDER — LIDOCAINE 2% (20 MG/ML) 5 ML SYRINGE
INTRAMUSCULAR | Status: AC
  Filled 2024-01-13: qty 5

## 2024-01-13 MED ORDER — AMISULPRIDE (ANTIEMETIC) 5 MG/2ML IV SOLN
INTRAVENOUS | Status: AC
  Filled 2024-01-13: qty 4

## 2024-01-13 MED ORDER — EPHEDRINE SULFATE (PRESSORS) 50 MG/ML IJ SOLN
INTRAMUSCULAR | Status: DC | PRN
  Administered 2024-01-13 (×2): 10 mg via INTRAVENOUS

## 2024-01-13 MED ORDER — PHENYLEPHRINE HCL (PRESSORS) 10 MG/ML IV SOLN
INTRAVENOUS | Status: DC | PRN
  Administered 2024-01-13 (×2): 160 ug via INTRAVENOUS

## 2024-01-13 MED ORDER — PROPOFOL 10 MG/ML IV BOLUS
INTRAVENOUS | Status: DC | PRN
  Administered 2024-01-13: 200 mg via INTRAVENOUS

## 2024-01-13 MED ORDER — FENTANYL CITRATE (PF) 100 MCG/2ML IJ SOLN
25.0000 ug | INTRAMUSCULAR | Status: DC | PRN
Start: 2024-01-13 — End: 2024-01-13
  Administered 2024-01-13 (×2): 25 ug via INTRAVENOUS
  Administered 2024-01-13: 50 ug via INTRAVENOUS

## 2024-01-13 MED ORDER — KETOROLAC TROMETHAMINE 30 MG/ML IJ SOLN
INTRAMUSCULAR | Status: DC | PRN
  Administered 2024-01-13: 30 mg via INTRAVENOUS

## 2024-01-13 MED ORDER — DIPHENHYDRAMINE HCL 25 MG PO CAPS
ORAL_CAPSULE | ORAL | Status: AC
  Filled 2024-01-13: qty 1

## 2024-01-13 MED ORDER — BUPIVACAINE HCL (PF) 0.25 % IJ SOLN
INTRAMUSCULAR | Status: DC | PRN
  Administered 2024-01-13: 10 mL

## 2024-01-13 MED ORDER — MIDAZOLAM HCL 5 MG/5ML IJ SOLN
INTRAMUSCULAR | Status: DC | PRN
  Administered 2024-01-13: 2 mg via INTRAVENOUS

## 2024-01-13 MED ORDER — ACETAMINOPHEN 500 MG PO TABS
1000.0000 mg | ORAL_TABLET | ORAL | Status: AC
  Administered 2024-01-13: 1000 mg via ORAL

## 2024-01-13 MED ORDER — KETOROLAC TROMETHAMINE 30 MG/ML IJ SOLN
INTRAMUSCULAR | Status: AC
  Filled 2024-01-13: qty 1

## 2024-01-13 MED ORDER — ONDANSETRON HCL 4 MG/2ML IJ SOLN
INTRAMUSCULAR | Status: DC | PRN
  Administered 2024-01-13: 4 mg via INTRAVENOUS

## 2024-01-13 MED ORDER — DEXAMETHASONE SODIUM PHOSPHATE 4 MG/ML IJ SOLN
INTRAMUSCULAR | Status: DC | PRN
  Administered 2024-01-13: 5 mg via INTRAVENOUS

## 2024-01-13 MED ORDER — BUPIVACAINE HCL (PF) 0.25 % IJ SOLN
INTRAMUSCULAR | Status: AC
  Filled 2024-01-13: qty 30

## 2024-01-13 MED ORDER — ACETAMINOPHEN 500 MG PO TABS
ORAL_TABLET | ORAL | Status: AC
  Filled 2024-01-13: qty 2

## 2024-01-13 MED ORDER — PROPOFOL 500 MG/50ML IV EMUL
INTRAVENOUS | Status: DC | PRN
  Administered 2024-01-13: 125 ug/kg/min via INTRAVENOUS

## 2024-01-13 MED ORDER — FENTANYL CITRATE (PF) 100 MCG/2ML IJ SOLN
INTRAMUSCULAR | Status: DC | PRN
  Administered 2024-01-13: 25 ug via INTRAVENOUS
  Administered 2024-01-13: 50 ug via INTRAVENOUS

## 2024-01-13 MED ORDER — AMISULPRIDE (ANTIEMETIC) 5 MG/2ML IV SOLN
10.0000 mg | Freq: Once | INTRAVENOUS | Status: AC | PRN
  Administered 2024-01-13: 10 mg via INTRAVENOUS

## 2024-01-13 MED ORDER — LACTATED RINGERS IV SOLN: INTRAVENOUS | Status: DC

## 2024-01-13 MED ORDER — CEFAZOLIN SODIUM-DEXTROSE 2-4 GM/100ML-% IV SOLN
2.0000 g | INTRAVENOUS | Status: AC
  Administered 2024-01-13: 2 g via INTRAVENOUS

## 2024-01-13 MED ORDER — PROPOFOL 10 MG/ML IV BOLUS
INTRAVENOUS | Status: AC
  Filled 2024-01-13: qty 20

## 2024-01-13 MED ORDER — CEFAZOLIN SODIUM-DEXTROSE 2-4 GM/100ML-% IV SOLN
INTRAVENOUS | Status: AC
  Filled 2024-01-13: qty 100

## 2024-01-13 MED ORDER — LIDOCAINE HCL (CARDIAC) PF 100 MG/5ML IV SOSY: PREFILLED_SYRINGE | INTRAVENOUS | Status: DC | PRN

## 2024-01-13 MED ORDER — OXYCODONE HCL 5 MG PO TABS
5.0000 mg | ORAL_TABLET | Freq: Once | ORAL | Status: AC | PRN
  Administered 2024-01-13: 5 mg via ORAL

## 2024-01-13 MED ORDER — DIPHENHYDRAMINE HCL 25 MG PO CAPS
25.0000 mg | ORAL_CAPSULE | Freq: Once | ORAL | Status: AC | PRN
  Administered 2024-01-13: 25 mg via ORAL

## 2024-01-13 MED ORDER — OXYCODONE HCL 5 MG PO TABS
ORAL_TABLET | ORAL | Status: AC
  Filled 2024-01-13: qty 1

## 2024-01-13 MED ORDER — OXYCODONE HCL 5 MG/5ML PO SOLN: 5.0000 mg | Freq: Once | ORAL | Status: AC | PRN

## 2024-01-13 SURGICAL SUPPLY — 43 items
BINDER BREAST XLRG (GAUZE/BANDAGES/DRESSINGS) IMPLANT
BINDER BREAST XXLRG (GAUZE/BANDAGES/DRESSINGS) IMPLANT
BLADE SURG 15 STRL LF DISP TIS (BLADE) ×1 IMPLANT
CANISTER SUC SOCK COL 7IN (MISCELLANEOUS) IMPLANT
CANISTER SUCT 1200ML W/VALVE (MISCELLANEOUS) IMPLANT
CHLORAPREP W/TINT 26 (MISCELLANEOUS) ×1 IMPLANT
CLIP APPLIE 9.375 MED OPEN (MISCELLANEOUS) IMPLANT
CLIP TI WIDE RED SMALL 6 (CLIP) IMPLANT
COVER BACK TABLE 60X90IN (DRAPES) ×1 IMPLANT
COVER MAYO STAND STRL (DRAPES) ×1 IMPLANT
COVER PROBE CYLINDRICAL 5X96 (MISCELLANEOUS) ×1 IMPLANT
DERMABOND ADVANCED .7 DNX12 (GAUZE/BANDAGES/DRESSINGS) ×1 IMPLANT
DRAPE LAPAROSCOPIC ABDOMINAL (DRAPES) ×1 IMPLANT
DRAPE UTILITY XL STRL (DRAPES) ×1 IMPLANT
DRSG TEGADERM 4X4.75 (GAUZE/BANDAGES/DRESSINGS) IMPLANT
ELECT COATED BLADE 2.86 ST (ELECTRODE) ×1 IMPLANT
ELECTRODE REM PT RTRN 9FT ADLT (ELECTROSURGICAL) ×1 IMPLANT
GAUZE SPONGE 4X4 12PLY STRL LF (GAUZE/BANDAGES/DRESSINGS) IMPLANT
GLOVE BIO SURGEON STRL SZ7 (GLOVE) ×2 IMPLANT
GLOVE BIOGEL PI IND STRL 7.5 (GLOVE) ×1 IMPLANT
GOWN STRL REUS W/ TWL LRG LVL3 (GOWN DISPOSABLE) ×2 IMPLANT
HEMOSTAT ARISTA ABSORB 3G PWDR (HEMOSTASIS) IMPLANT
KIT MARKER MARGIN INK (KITS) ×1 IMPLANT
NDL HYPO 25X1 1.5 SAFETY (NEEDLE) ×1 IMPLANT
NEEDLE HYPO 25X1 1.5 SAFETY (NEEDLE) ×1 IMPLANT
NS IRRIG 1000ML POUR BTL (IV SOLUTION) IMPLANT
PACK BASIN DAY SURGERY FS (CUSTOM PROCEDURE TRAY) ×1 IMPLANT
PENCIL SMOKE EVACUATOR (MISCELLANEOUS) ×1 IMPLANT
RETRACTOR ONETRAX LX 90X20 (MISCELLANEOUS) IMPLANT
SLEEVE SCD COMPRESS KNEE MED (STOCKING) ×1 IMPLANT
SPIKE FLUID TRANSFER (MISCELLANEOUS) IMPLANT
SPONGE T-LAP 4X18 ~~LOC~~+RFID (SPONGE) ×1 IMPLANT
STRIP CLOSURE SKIN 1/2X4 (GAUZE/BANDAGES/DRESSINGS) ×1 IMPLANT
SUT MNCRL AB 4-0 PS2 18 (SUTURE) IMPLANT
SUT MON AB 5-0 PS2 18 (SUTURE) IMPLANT
SUT SILK 2 0 SH (SUTURE) IMPLANT
SUT VIC AB 2-0 SH 27XBRD (SUTURE) ×1 IMPLANT
SUT VIC AB 3-0 SH 27X BRD (SUTURE) ×1 IMPLANT
SYR CONTROL 10ML LL (SYRINGE) ×1 IMPLANT
TOWEL GREEN STERILE FF (TOWEL DISPOSABLE) ×1 IMPLANT
TRAY FAXITRON CT DISP (TRAY / TRAY PROCEDURE) ×1 IMPLANT
TUBE CONNECTING 20X1/4 (TUBING) IMPLANT
YANKAUER SUCT BULB TIP NO VENT (SUCTIONS) IMPLANT

## 2024-01-13 NOTE — Interval H&P Note (Signed)
 History and Physical Interval Note:  01/13/2024 7:02 AM  Julie Hansen  has presented today for surgery, with the diagnosis of LEFT BREAST MASS.  The various methods of treatment have been discussed with the patient and family. After consideration of risks, benefits and other options for treatment, the patient has consented to  Procedure(s) with comments: EXCISION, MASS, BREAST, USING RADIOLOGICAL MARKER (Left) - left as a surgical intervention.  The patient's history has been reviewed, patient examined, no change in status, stable for surgery.  I have reviewed the patient's chart and labs.  Questions were answered to the patient's satisfaction.     Enid Harry

## 2024-01-13 NOTE — Transfer of Care (Signed)
 Immediate Anesthesia Transfer of Care Note  Patient: Julie Hansen  Procedure(s) Performed: EXCISION, MASS, BREAST, USING RADIOLOGICAL MARKER (Left: Breast)  Patient Location: PACU  Anesthesia Type:General  Level of Consciousness: awake, alert , and oriented  Airway & Oxygen Therapy: Patient Spontanous Breathing and Patient connected to face mask oxygen  Post-op Assessment: Report given to RN and Post -op Vital signs reviewed and stable  Post vital signs: Reviewed and stable  Last Vitals:  Vitals Value Taken Time  BP    Temp    Pulse 82 01/13/24 0831  Resp 8 01/13/24 0831  SpO2 100 % 01/13/24 0831  Vitals shown include unfiled device data.  Last Pain:  Vitals:   01/13/24 0650  TempSrc:   PainSc: 0-No pain         Complications: No notable events documented.

## 2024-01-13 NOTE — Addendum Note (Signed)
 Addendum  created 01/13/24 1023 by Elvia Hammans, CRNA   Intraprocedure Meds edited

## 2024-01-13 NOTE — Discharge Instructions (Addendum)
 [facility] Office Phone Number [phone]  POST OP INSTRUCTIONS Take 400 mg of ibuprofen  every 8 hours or 650 mg tylenol  every 6 hours for next 72 hours then as needed. Use ice several times daily also.  A prescription for pain medication may be given to you upon discharge.  Take your pain medication as prescribed, if needed.  If narcotic pain medicine is not needed, then you may take acetaminophen  (Tylenol ), naprosyn (Alleve) or ibuprofen  (Advil ) as needed. Take your usually prescribed medications unless otherwise directed If you need a refill on your pain medication, please contact your pharmacy.  They will contact our office to request authorization.  Prescriptions will not be filled after 5pm or on week-ends. You should eat very light the first 24 hours after surgery, such as soup, crackers, pudding, etc.  Resume your normal diet the day after surgery. Most patients will experience some swelling and bruising in the breast.  Ice packs and a good support bra will help.  Wear the breast binder provided or a sports bra for 72 hours day and night.  After that wear a sports bra during the day until you return to the office. Swelling and bruising can take several days to resolve.  It is common to experience some constipation if taking pain medication after surgery.  Increasing fluid intake and taking a stool softener will usually help or prevent this problem from occurring.  A mild laxative (Milk of Magnesia or Miralax) should be taken according to package directions if there are no bowel movements after 48 hours. I used skin glue on the incision, you may shower in 24 hours.  The glue will flake off over the next 2-3 weeks.  Any sutures or staples will be removed at the office during your follow-up visit. ACTIVITIES:  You may resume regular daily activities (gradually increasing) beginning the next day.  Wearing a good support bra or sports bra minimizes pain and swelling.  You may have  sexual intercourse when it is comfortable. You may drive when you no longer are taking prescription pain medication, you can comfortably wear a seatbelt, and you can safely maneuver your car and apply brakes. RETURN TO WORK:  ______________________________________________________________________________________ Julie Hansen should see your doctor in the office for a follow-up appointment approximately two weeks after your surgery.  Your doctor's nurse will typically make your follow-up appointment when she calls you with your pathology report.  Expect your pathology report 3-4 business days after your surgery.  You may call to check if you do not hear from us  after three days. OTHER INSTRUCTIONS: _______________________________________________________________________________________________ _____________________________________________________________________________________________________________________________________ _____________________________________________________________________________________________________________________________________ _____________________________________________________________________________________________________________________________________  WHEN TO CALL DR WAKEFIELD: Fever over 101.0 Nausea and/or vomiting. Extreme swelling or bruising. Continued bleeding from incision. Increased pain, redness, or drainage from the incision.  The clinic staff is available to answer your questions during regular business hours.  Please don't hesitate to call and ask to speak to one of the nurses for clinical concerns.  If you have a medical emergency, go to the nearest emergency room or call 911.  A surgeon from [facility] is always on call at the hospital.  For further questions, please visit [email or web address]  No Tylenol  before 1pm today. No Ibuprofen /NSAIDS until after 4pm today.   Post Anesthesia Home Care Instructions  Activity: Get plenty  of rest for the remainder of the day. A responsible individual must stay with you for 24 hours following the procedure.  For the next 24 hours, DO NOT: -Drive a  car -Advertising copywriter -Drink alcoholic beverages -Take any medication unless instructed by your physician -Make any legal decisions or sign important papers.  Meals: Start with liquid foods such as gelatin or soup. Progress to regular foods as tolerated. Avoid greasy, spicy, heavy foods. If nausea and/or vomiting occur, drink only clear liquids until the nausea and/or vomiting subsides. Call your physician if vomiting continues.  Special Instructions/Symptoms: Your throat may feel dry or sore from the anesthesia or the breathing tube placed in your throat during surgery. If this causes discomfort, gargle with warm salt water. The discomfort should disappear within 24 hours.  If you had a scopolamine  patch placed behind your ear for the management of post- operative nausea and/or vomiting:  1. The medication in the patch is effective for 72 hours, after which it should be removed.  Wrap patch in a tissue and discard in the trash. Wash hands thoroughly with soap and water. 2. You may remove the patch earlier than 72 hours if you experience unpleasant side effects which may include dry mouth, dizziness or visual disturbances. 3. Avoid touching the patch. Wash your hands with soap and water after contact with the patch.    You had Benadryl  25mg  and Oxycodone  5mg  at 0920 today.

## 2024-01-13 NOTE — H&P (Signed)
 53 year old female who I know from my prior surgery. 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 She has a significant family of breast cancer in her mom and her maternal grandmother in early 37s as well as multiple aunts. She does have prior negative genetic testing. She has been followed since then. She had an abnormality on her mammogram that underwent ultrasound that eventually underwent an MRI. The area that she thought she was feeling does not show really anything. On the MR she has B density tissue. On the right breast there is nothing worrisome in the left breast in the outer central left breast there was a 1.3 cm area of enhancement. MR guided biopsy was done that shows a intraductal papilloma with some apical metaplasia. She has no discharge and no mass today. She is here to discuss her options.  Review of Systems: A complete review of systems was obtained from the patient. I have reviewed this information and discussed as appropriate with the patient. See HPI as well for other ROS.  Review of Systems  All other systems reviewed and are negative.   Medical History: Past Medical History:  Diagnosis Date  Anxiety  Depression  GERD (gastroesophageal reflux disease)  H1N1 influenza 2012  Hyperlipidemia  Obesity (BMI 35.0-39.9 without comorbidity), unspecified  OSA on CPAP  PCOS (polycystic ovarian syndrome)  Reactive airway disease (HHS-HCC)  Type 2 diabetes mellitus (CMS/HHS-HCC)  Vitamin D  deficiency   Patient Active Problem List  Diagnosis  Diabetes mellitus type 2, controlled (CMS/HHS-HCC)  Vaginal discharge  Dyspnea on exertion  Vitamin D  insufficiency  Obesity (BMI 30-39.9), unspecified  Diabetes mellitus type 2 in obese (CMS/HHS-HCC)  Type 2 diabetes mellitus with hyperglycemia, without long-term current use of insulin  (CMS/HHS-HCC)  Uncontrolled type 2 diabetes mellitus with hyperglycemia (CMS/HHS-HCC)   Allergies  Allergen  Reactions  Hydrocodone -Homatropine Itching  Carbamazepine (Mood Stabiliz) Other (See Comments)  Nitrofurantoin Macrocrystal Itching  Carbamazepine Other (See Comments)  Vaginal discharge   Current Outpatient Medications on File Prior to Visit  Medication Sig Dispense Refill  albuterol  90 mcg/actuation inhaler Inhale 2 inhalations into the lungs once daily as needed for Wheezing.  ALPRAZolam  (XANAX ) 0.5 MG tablet Take 0.5 mg by mouth once daily as needed 0  atorvastatin  (LIPITOR) 40 MG tablet Take 40 mg by mouth once daily.  baclofen  (LIORESAL ) 10 MG tablet Take 10 mg by mouth  beclomethasone (QVAR ) 40 mcg/actuation inhaler Inhale 1 inhalation into the lungs once daily.  dexlansoprazole  (DEXILANT  ORAL) Take 1 tablet by mouth once daily  diclofenac  (VOLTAREN ) 75 MG EC tablet Take 75 mg by mouth 2 (two) times daily as needed  ergocalciferol , vitamin D2, 1,250 mcg (50,000 unit) capsule Take 2 capsules (100,000 Units total) by mouth once a week 24 capsule 3  FARXIGA  10 mg tablet TAKE 1 TABLET (10 MG TOTAL) BY MOUTH EVERY MORNING 30 tablet 5  fenofibrate  nanocrystallized (TRICOR ) 48 MG tablet Take 48 mg by mouth once daily  fluconazole  (DIFLUCAN ) 200 MG tablet  FLUoxetine  (PROZAC ) 10 MG capsule Take 10 mg by mouth once daily  fluticasone  (FLONASE ) 50 mcg/actuation nasal spray Place 2 sprays into both nostrils once daily.  glipiZIDE (GLUCOTROL) 10 MG tablet  HYDROcodone -chlorpheniramine (TUSSIONEX) 10-8 mg/5 mL ER suspension TAKE 1 TEASPOONFUL EVERY 12 HOURS AS NEEDED FOR COUGH  hydrOXYzine  HCL (ATARAX ) 10 MG tablet  ibuprofen  (MOTRIN ) 800 MG tablet Take 800 mg by mouth  insulin  DEGLUDEC (TRESIBA  FLEXTOUCH U-200) pen injector (concentration 200 units/mL) Inject  56 units nightly, as directed. 9 mL 11  insulin  LISPRO (HUMALOG  KWIKPEN INSULIN ) pen injector (concentration 100 units/mL) Inject 10 units before the morning meal and 15 units before evening meal. Only take before eating. 15 mL 11   lisinopriL  (ZESTRIL ) 5 MG tablet Take 1 tablet (5 mg total) by mouth once daily 90 tablet 3  lurasidone (LATUDA) 40 mg tablet  metoprolol  SUCCinate (TOPROL -XL) 50 MG XL tablet Take 50 mg by mouth once daily  OLANZapine (ZYPREXA) 5 MG tablet  omeprazole  (PRILOSEC) 40 MG DR capsule Take 40 mg by mouth once daily  OZEMPIC  1 mg/dose (4 mg/3 mL) PnIj INJECT 1 MG SUBCUTANEOUSLY EVERY 7 DAYS 9 mL 0  promethazine  (PHENERGAN ) 25 MG tablet Take 1 tablet by mouth every 4 (four) hours  QUEtiapine  (SEROQUEL ) 200 MG tablet Take 600 mg by mouth nightly  zolpidem  (AMBIEN ) 10 mg tablet Take 10 mg by mouth nightly as needed for Sleep.  blood glucose diagnostic (ACCU-CHEK GUIDE TEST STRIPS) test strip Use 2 (two) times daily (Patient not taking: Reported on 01/08/2020) 200 each 3  lancets (ACCU-CHEK FASTCLIX LANCING DEV) Use 1 each 2 (two) times daily accu chek fastclix lancets e11.65 (Patient not taking: Reported on 01/08/2020) 200 each 3  pen needle, diabetic 32 gauge x 1/4" needle Use as directed (Patient not taking: Reported on 02/05/2019 ) 100 each 12  ranitidine (ZANTAC) 300 MG tablet Take 300 mg by mouth nightly. (Patient not taking: Reported on 01/08/2020)    Family History  Problem Relation Age of Onset  Diabetes Father  High blood pressure (Hypertension) Father  Diabetes Mother  High blood pressure (Hypertension) Mother  Breast cancer Mother  Diabetes Paternal Grandfather  Diabetes Paternal Grandmother  Diabetes Maternal Grandfather  Diabetes Maternal Grandmother  Heart disease Maternal Grandmother  Breast cancer Maternal Grandmother  Heart disease Paternal Aunt  Heart disease Paternal Uncle  Myocardial Infarction (Heart attack) Paternal Aunt  Myocardial Infarction (Heart attack) Paternal Uncle  Myocardial Infarction (Heart attack) Maternal Uncle  Stroke Other  Kidney disease Maternal Uncle  Kidney disease Maternal Aunt  Kidney disease Paternal Aunt  Kidney disease Paternal Uncle  Breast  cancer Paternal Aunt  Ovarian cancer Paternal Aunt    Social History   Tobacco Use  Smoking Status Never  Smokeless Tobacco Never  Marital status: Married  Tobacco Use  Smoking status: Never  Smokeless tobacco: Never  Vaping Use  Vaping status: Never Used  Substance and Sexual Activity  Alcohol use: Yes  Alcohol/week: 1.0 standard drink of alcohol  Types: 1 Glasses of wine per week  Drug use: No   Objective:  BP: 96/67  Pulse: 72  Temp: 36.5 C (97.7 F)  SpO2: 99%  Weight: 96.4 kg (212 lb 9.6 oz)  Height: 162.6 cm (5\' 4" )  PainSc: 0-No pain  PainLoc: Breast   Body mass index is 36.49 kg/m.  Physical Exam Vitals reviewed.  Constitutional:  Appearance: Normal appearance.  Chest:  Breasts: Right: No inverted nipple, mass, nipple discharge or skin change.  Left: No inverted nipple, mass, nipple discharge or skin change.  Lymphadenopathy:  Upper Body:  Right upper body: No supraclavicular or axillary adenopathy.  Left upper body: No supraclavicular or axillary adenopathy.  Neurological:  Mental Status: She is alert.    Assessment and Plan:   Mass of upper outer quadrant of left breast  Left breast seed guided excisional biopsy  We discussed her options today. We discussed following this but this would require a 59-month MR and I  am concerned with her family history as well. She would like this area excised. We discussed a seed guided excisional biopsy as well as the risks and the recovery associated with that and we will plan to schedule her.

## 2024-01-13 NOTE — Anesthesia Procedure Notes (Signed)
 Procedure Name: LMA Insertion Date/Time: 01/13/2024 7:35 AM  Performed by: Elvia Hammans, CRNAPre-anesthesia Checklist: Patient identified, Emergency Drugs available, Suction available and Patient being monitored Patient Re-evaluated:Patient Re-evaluated prior to induction Oxygen Delivery Method: Circle system utilized Preoxygenation: Pre-oxygenation with 100% oxygen Induction Type: IV induction Ventilation: Mask ventilation without difficulty LMA: LMA inserted LMA Size: 4.0 Number of attempts: 1 Airway Equipment and Method: Bite block Placement Confirmation: positive ETCO2 Tube secured with: Tape Dental Injury: Teeth and Oropharynx as per pre-operative assessment

## 2024-01-13 NOTE — Op Note (Signed)
  Preoperative diagnosis: Left breast mass on MR, core with papilloma Postoperative diagnosis: Same as above Procedure: Left breast seed guided excisional biopsy Surgeon: Dr. Donavan Fuchs Anesthesia: General Estimated blood loss: Minimal Complications: None Drains: None Specimens: Left breast mass containing seed and clip marked with paint Disposition to recovery   Indications: 53 year old female who I know from my prior surgery. 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 She has a significant family of breast cancer in her mom and her maternal grandmother in early 65s as well as multiple aunts. She does have prior negative genetic testing. She has been followed since then. She had an abnormality on her mammogram that underwent ultrasound that eventually underwent an MRI. The area that she thought she was feeling does not show really anything. On the MR she has B density tissue. On the right breast there is nothing worrisome in the left breast in the outer central left breast there was a 1.3 cm area of enhancement. MR guided biopsy was done that shows a intraductal papilloma with some apical metaplasia. She has no discharge and no mass today. We discussed options and elected to proceed with excision.     Procedure: After informed consent was obtained she was taken to the operating room.  She had had the seed placed prior to this and I had these mammograms available for my review.  She had SCDs in place.  She was given antibiotics.  She was placed under general anesthesia without complication.  She was prepped and draped in standard sterile surgical fashion.  A surgical timeout was then performed.   I infiltrated Marcaine  throughout the lateral left breast. I made a curvilinear incision overlying the seed as it was fairly close to skin.  I then removed the seed and surrounding tissue.   I removed this and passed it off the table after marking it with paint.  A  mammogram confirmed removal of the seed and the clip.  I then obtained hemostasis.  I closed the breast tissue with 2-0 Vicryl.  The skin was closed with 3-0 Vicryl and 4-0 Monocryl.  Glue and Steri-Strips were applied.  She tolerated this well was transferred recovery stable.

## 2024-01-13 NOTE — Anesthesia Postprocedure Evaluation (Signed)
 Anesthesia Post Note  Patient: Julie Hansen  Procedure(s) Performed: EXCISION, MASS, BREAST, USING RADIOLOGICAL MARKER (Left: Breast)     Patient location during evaluation: PACU Anesthesia Type: General Level of consciousness: awake Pain management: pain level controlled Vital Signs Assessment: post-procedure vital signs reviewed and stable Respiratory status: spontaneous breathing, nonlabored ventilation and respiratory function stable Cardiovascular status: blood pressure returned to baseline and stable Postop Assessment: no apparent nausea or vomiting Anesthetic complications: no   No notable events documented.  Last Vitals:  Vitals:   01/13/24 0845 01/13/24 0900  BP: 103/65 98/66  Pulse: 75 85  Resp: 13 20  Temp:    SpO2: 98% 99%    Last Pain:  Vitals:   01/13/24 0900  TempSrc:   PainSc: 6                  Conard Decent

## 2024-01-14 ENCOUNTER — Encounter (HOSPITAL_BASED_OUTPATIENT_CLINIC_OR_DEPARTMENT_OTHER): Payer: Self-pay | Admitting: General Surgery

## 2024-01-14 LAB — SURGICAL PATHOLOGY

## 2024-01-15 ENCOUNTER — Other Ambulatory Visit: Payer: Self-pay | Admitting: *Deleted

## 2024-01-15 ENCOUNTER — Other Ambulatory Visit: Payer: Self-pay

## 2024-01-15 DIAGNOSIS — G4733 Obstructive sleep apnea (adult) (pediatric): Secondary | ICD-10-CM | POA: Diagnosis not present

## 2024-01-15 MED ORDER — BOTOX 200 UNITS IJ SOLR
200.0000 [IU] | INTRAMUSCULAR | 2 refills | Status: DC
Start: 2024-01-15 — End: 2024-06-05
  Filled 2024-01-15 – 2024-01-16 (×2): qty 1, 90d supply, fill #0
  Filled 2024-04-30: qty 1, 90d supply, fill #1

## 2024-01-16 ENCOUNTER — Other Ambulatory Visit: Payer: Self-pay | Admitting: Pharmacy Technician

## 2024-01-16 ENCOUNTER — Other Ambulatory Visit: Payer: Self-pay

## 2024-01-16 NOTE — Progress Notes (Signed)
 Specialty Pharmacy Refill Coordination Note  Julie Hansen is a 53 y.o. female contacted today regarding refills of specialty medication(s) OnabotulinumtoxinA  (Botox )   Patient requested Courier to Provider Office   Delivery date: 01/20/24   Verified address: [facility] [employer] [employer]   Medication will be filled on 01/17/24.

## 2024-01-17 ENCOUNTER — Other Ambulatory Visit (HOSPITAL_COMMUNITY): Payer: Self-pay

## 2024-01-17 ENCOUNTER — Encounter: Admitting: Physician Assistant

## 2024-01-17 ENCOUNTER — Other Ambulatory Visit: Payer: Self-pay

## 2024-01-17 ENCOUNTER — Encounter: Payer: Commercial Managed Care - PPO | Admitting: Physician Assistant

## 2024-01-17 MED ORDER — BACLOFEN 10 MG PO TABS
10.0000 mg | ORAL_TABLET | ORAL | 2 refills | Status: DC | PRN
  Filled 2024-01-17 – 2024-01-31 (×2): qty 30, 30d supply, fill #0
  Filled 2024-02-29 (×2): qty 30, 30d supply, fill #1
  Filled 2024-03-31: qty 30, 30d supply, fill #2

## 2024-01-20 ENCOUNTER — Encounter: Payer: Self-pay | Admitting: *Deleted

## 2024-01-21 ENCOUNTER — Encounter: Admitting: Physical Therapy

## 2024-01-27 ENCOUNTER — Other Ambulatory Visit (HOSPITAL_COMMUNITY): Payer: Self-pay | Admitting: Gastroenterology

## 2024-01-27 ENCOUNTER — Other Ambulatory Visit (HOSPITAL_COMMUNITY): Payer: Self-pay

## 2024-01-27 DIAGNOSIS — R110 Nausea: Secondary | ICD-10-CM

## 2024-01-27 DIAGNOSIS — R1011 Right upper quadrant pain: Secondary | ICD-10-CM

## 2024-01-30 ENCOUNTER — Other Ambulatory Visit (HOSPITAL_BASED_OUTPATIENT_CLINIC_OR_DEPARTMENT_OTHER): Payer: Self-pay

## 2024-01-30 ENCOUNTER — Other Ambulatory Visit (HOSPITAL_COMMUNITY): Payer: Self-pay

## 2024-01-30 ENCOUNTER — Other Ambulatory Visit (HOSPITAL_COMMUNITY): Payer: Self-pay | Admitting: Gastroenterology

## 2024-01-30 ENCOUNTER — Ambulatory Visit (HOSPITAL_BASED_OUTPATIENT_CLINIC_OR_DEPARTMENT_OTHER)
Admission: RE | Admit: 2024-01-30 | Discharge: 2024-01-30 | Disposition: A | Discharge status: Home / Self Care | Source: Ambulatory Visit | Attending: Gastroenterology | Admitting: Gastroenterology

## 2024-01-30 DIAGNOSIS — R1011 Right upper quadrant pain: Secondary | ICD-10-CM

## 2024-01-30 DIAGNOSIS — K7689 Other specified diseases of liver: Secondary | ICD-10-CM | POA: Diagnosis not present

## 2024-01-31 ENCOUNTER — Ambulatory Visit: Admitting: Physician Assistant

## 2024-01-31 ENCOUNTER — Encounter: Payer: Self-pay | Admitting: Physician Assistant

## 2024-01-31 ENCOUNTER — Other Ambulatory Visit: Payer: Self-pay | Admitting: Physician Assistant

## 2024-01-31 ENCOUNTER — Other Ambulatory Visit (HOSPITAL_COMMUNITY): Payer: Self-pay

## 2024-01-31 ENCOUNTER — Other Ambulatory Visit (INDEPENDENT_AMBULATORY_CARE_PROVIDER_SITE_OTHER): Payer: Self-pay

## 2024-01-31 ENCOUNTER — Other Ambulatory Visit: Payer: Self-pay

## 2024-01-31 DIAGNOSIS — Z96652 Presence of left artificial knee joint: Secondary | ICD-10-CM | POA: Diagnosis not present

## 2024-01-31 DIAGNOSIS — G43009 Migraine without aura, not intractable, without status migrainosus: Secondary | ICD-10-CM

## 2024-01-31 MED ORDER — PROMETHAZINE HCL 25 MG PO TABS
25.0000 mg | ORAL_TABLET | Freq: Four times a day (QID) | ORAL | 1 refills | Status: DC | PRN
  Filled 2024-01-31: qty 30, 8d supply, fill #0
  Filled 2024-02-29 (×2): qty 30, 8d supply, fill #1

## 2024-01-31 MED ORDER — IBUPROFEN 800 MG PO TABS
800.0000 mg | ORAL_TABLET | Freq: Three times a day (TID) | ORAL | 1 refills | Status: DC | PRN
  Filled 2024-01-31: qty 60, 20d supply, fill #0
  Filled 2024-04-11: qty 60, 20d supply, fill #1

## 2024-01-31 MED ORDER — AMOXICILLIN 500 MG PO CAPS
ORAL_CAPSULE | ORAL | 2 refills | Status: AC
  Filled 2024-01-31: qty 8, 2d supply, fill #0
  Filled 2024-09-30 (×2): qty 8, 2d supply, fill #1

## 2024-01-31 MED ORDER — FLUCONAZOLE 150 MG PO TABS
150.0000 mg | ORAL_TABLET | Freq: Every day | ORAL | 0 refills | Status: AC
Start: 2024-01-31 — End: ?
  Filled 2024-01-31: qty 2, 2d supply, fill #0

## 2024-01-31 MED ORDER — ONABOTULINUMTOXINA 200 UNITS IJ SOLR
200.0000 [IU] | Freq: Once | INTRAMUSCULAR | Status: AC
  Administered 2024-01-31: 200 [IU] via INTRAMUSCULAR

## 2024-01-31 MED ORDER — TRAMADOL HCL 50 MG PO TABS
50.0000 mg | ORAL_TABLET | Freq: Two times a day (BID) | ORAL | 1 refills | Status: DC | PRN
  Filled 2024-01-31: qty 30, 15d supply, fill #0
  Filled 2024-02-29 (×2): qty 30, 15d supply, fill #1

## 2024-01-31 NOTE — Progress Notes (Signed)
 CC: Follow up Botox     Pt supplied botox    S: Pt in office today for Botox  injections. Her Botox  has been working very well for migraine prevention.    O: BP 98/64   Pulse 80   Wt 206 lb (93.4 kg)   BMI 35.36 kg/m    Botox  Procedure Note Vial of Botox  was : Supplied by Patient   Botox  Dosing by [employer] [facility] for Chronic Migraine   Injection Sites for Migraines  Botox  155 units was injected using the dosage in the table above in the pattern shown above.  A: Migraine  Muscle spasm   P: Botox  155 units injected today.  Pt advised to postpone getting hair washed/colored and massage. RTC 3 Months.

## 2024-01-31 NOTE — Progress Notes (Signed)
 Post-Op Visit Note   Patient: Julie Hansen           Date of Birth: [birth year]           MRN: [id number] Visit Date: 01/31/2024 PCP: Emaline Handsome, MD   Assessment & Plan:  Chief Complaint:  Chief Complaint  Patient presents with   Left Knee - Follow-up    Left total knee arthroplasty 09/04/2023   Visit Diagnoses:  1. Status post total left knee replacement     Plan: Patient is a pleasant 53 year old female who comes in today 5 months status post left total knee replacement 09/04/2023.  She has been doing great.  She is feeling almost 100%.  Examination of the left knee reveals range of motion from 0 to 120 degrees.  She is neurovascularly intact distally.  At this point, she will continue to advance with activity as tolerated.  Dental prophylaxis reinforced.  Follow-up in 7 months for repeat evaluation and 2 view x-rays of the left knee.  Call with concerns or questions.  Follow-Up Instructions: Return in about 7 months (around 09/01/2024).   Orders:  Orders Placed This Encounter  Procedures   XR Knee 1-2 Views Left   Meds ordered this encounter  Medications   amoxicillin  (AMOXIL ) 500 MG capsule    Sig: Take 4 pills one hour prior to dental work/colonoscopy    Dispense:  8 capsule    Refill:  2   fluconazole  (DIFLUCAN ) 150 MG tablet    Sig: Take 1 tablet (150 mg total) by mouth daily.    Dispense:  2 tablet    Refill:  0    Imaging: XR Knee 1-2 Views Left Result Date: 01/31/2024 Stable prosthesis.  No interval change.   PMFS History: Patient Active Problem List   Diagnosis Date Noted   Status post total left knee replacement 09/04/2023   Chronic migraine without aura without status migrainosus, not intractable 04/14/2021   Muscle spasm 06/20/2018   Unspecified dyspareunia (CODE) 03/20/2017   Amenorrhea 03/20/2017   Irritable bowel syndrome 01/01/2017   Status migrainosus 01/01/2017   Anxiety 01/01/2017   Vaginal discharge 12/17/2016   GERD  (gastroesophageal reflux disease) 10/30/2016   Mixed hyperlipidemia 10/30/2016   Diabetes type 2, controlled (HCC) 09/12/2015   OSA (obstructive sleep apnea) 02/16/2015   Asthma 10/29/2011   Past Medical History:  Diagnosis Date   Acid reflux    Anxiety    Asthma    Diabetes mellitus without complication (HCC)    Headache    migraines   Hyperlipidemia    Hypertension    Osteoarthritis of left knee    Palpitations    PCOS (polycystic ovarian syndrome)    PONV (postoperative nausea and vomiting)    Sleep apnea     Family History  Problem Relation Age of Onset   Cancer Mother    Hypertension Mother    Diabetes Mother    Breast cancer Mother 49   Cancer Maternal Grandmother    Breast cancer Maternal Grandmother 46   Cancer Father    Diabetes Father    Hypertension Father    Breast cancer Paternal Aunt 5   Breast cancer Paternal Aunt 14   Breast cancer Paternal Aunt 59    Past Surgical History:  Procedure Laterality Date   BREAST BIOPSY Left 04/09/2023   BREAST BIOPSY Left 04/09/2023   US  LT BREAST BX W LOC DEV 1ST LESION IMG BX SPEC US  GUIDE 04/09/2023 [facility]   BREAST  BIOPSY Left 04/17/2023   MM LT BREAST BX W LOC DEV 1ST LESION IMAGE BX SPEC STEREO GUIDE 04/17/2023 [facility]   BREAST BIOPSY  01/10/2024   MM LT RADIOACTIVE SEED LOC MAMMO GUIDE 01/10/2024 [facility]   BREAST EXCISIONAL BIOPSY Right 06/28/2020   papilloma   DILATION AND EVACUATION N/A 06/05/2017   Procedure: DILATATION AND EVACUATION;  Surgeon: Artemisa Bile, MD;  Location: [facility];  Service: Gynecology;  Laterality: N/A;   KNEE ARTHROSCOPY WITH MENISCAL REPAIR Left 09/20/2021   Procedure: LEFT KNEE PARTIAL MEDIAL MENISCECTOMY;  Surgeon: Wes Hamman, MD;  Location: [facility];  Service: Orthopedics;  Laterality: Left;   RADIOACTIVE SEED GUIDED EXCISIONAL BREAST BIOPSY Right 06/28/2020   Procedure: RIGHT RADIOACTIVE SEED GUIDED EXCISIONAL BREAST BIOPSY;  Surgeon:  Enid Harry, MD;  Location: [facility];  Service: General;  Laterality: Right;   RADIOACTIVE SEED GUIDED EXCISIONAL BREAST BIOPSY Left 01/13/2024   Procedure: EXCISION, MASS, BREAST, USING RADIOLOGICAL MARKER;  Surgeon: Enid Harry, MD;  Location: [facility];  Service: General;  Laterality: Left;  left   TOTAL KNEE ARTHROPLASTY Left 09/04/2023   Procedure: LEFT TOTAL KNEE ARTHROPLASTY;  Surgeon: Wes Hamman, MD;  Location: [facility];  Service: Orthopedics;  Laterality: Left;   WISDOM TOOTH EXTRACTION     Social History   Occupational History   Not on file  Tobacco Use   Smoking status: Never    Passive exposure: Never   Smokeless tobacco: Never  Vaping Use   Vaping status: Never Used  Substance and Sexual Activity   Alcohol use: Yes    Comment: maybe a drink once a month   Drug use: No   Sexual activity: Yes    Birth control/protection: None

## 2024-01-31 NOTE — Patient Instructions (Signed)
 Botulinum Toxin Injection Botulinum toxin injection is a procedure to soften lines and wrinkles in the face. Botulinum toxin is produced by a bacteria called Clostridium botulinum. The toxin is injected into muscles with a thin needle. The toxin works by blocking nerve impulses to certain muscles. This weakens and paralyzes those muscles for a short time, which reduces the wrinkles when you move your face. Tell a health care provider about: Any allergies you have, especially allergies to eggs or milk. All medicines you are taking, including antibiotics, vitamins, herbs, eye drops, creams, and over-the-counter medicines. Any bleeding problems you have. Any surgeries you have had. Any medical conditions you have. Whether you are pregnant or may be pregnant. Whether you are breastfeeding. What are the risks? Your health care provider will talk with you about risks. These may include: Pain in the neck or jaw. Allergic reaction to the toxin. Infection. Double vision or blurry vision. Drooping of the eyebrow or eyelid. Not being able to close one or both eyes. Changes in voice or speech. Trouble swallowing. What happens before the procedure? Ask your provider about: Changing or stopping your regular medicines. These include any diabetes medicines or blood thinners you take. Taking medicines such as aspirin and ibuprofen . These medicines can thin your blood. Do not take them unless your provider tells you to. Taking over-the-counter medicines, vitamins, herbs, and supplements. Do not drink alcohol if: Your provider tells you not to drink. You are pregnant, may be pregnant, or plan to become pregnant. If you drink alcohol: Limit how much you have to: 0-1 drink a day if you are female. 0-2 drinks a day if you are female. Know how much alcohol is in your drink. In the [place] one drink is one 12 oz bottle of beer (355 mL), one 5 oz glass of wine (148 mL), or one 1 oz glass of hard liquor (44  mL). What happens during the procedure?  The area to be treated may be: Chilled with ice. Numbed with medicine. The treatment site may have ice applied or you may be given a medicine to numb the area (local anesthetic) to reduce discomfort. Your provider will inject small amounts of the toxin into certain muscles. The number of injections that you get will depend on your specific condition and the area that is being treated. The procedure may vary among providers and hospitals. What can I expect after the procedure? After your procedure, it is common to have: Mild pain, soreness, swelling, itching, or redness around the injection sites. Redness may last for 1-2 days. In rare cases, it may last longer than 2 days. Small bruises around the injection sites. These may go away in a few days. Bumps or marks from the needle. These may go away within a few hours. Brief numbness near the injection sites. Headache. This is rare. The results of your treatment may take up to 14 days to fully show, although many people will see the benefits in 3-5 days after treatment. In most people, the benefits last about 3-4 months. The effectiveness of botulinum toxin will last longer with follow-up treatments. Follow these instructions at home: Managing pain and discomfort  Take over-the-counter and prescription medicines only as told by your provider. If told, put ice on the affected area. Put ice in a plastic bag. Place a towel between your skin and the bag. Leave the ice on for 20 minutes, 2-3 times per day. If your skin turns bright red, remove the ice right away to  prevent skin damage. The risk of damage is higher if you cannot feel pain, heat, or cold. General instructions Do not do activities that take a lot of effort for 12 hours after the procedure or for as long as told by your provider. This includes not lifting heavy items, not working out, not doing yoga, and not doing activities that increase your  heart rate. Do not lie down for 4 hours after treatment or as told by your provider. Do not rub the injected area for at least 4 hours after the procedure. This can spread the botulinum toxin to surrounding muscles. Depending on the injection site: Your provider may ask you to avoid moving the muscles in the injected area. Follow instructions from your provider. Your provider may tell you to frown or squint regularly. You may need to do these facial exercises every 15 minutes for 1 hour after treatment, or as told by your provider. Do not get laser treatments, facials, or facial massages for 1-2 weeks after the procedure or for as long as told by your provider. Contact a health care provider if: You have a headache that gets worse. You have a fever. Your eyelids become droopy or swollen. You have trouble speaking or swallowing. Get help right away if: You develop signs of an allergic reaction. These include: An itchy rash or welts. Wheezing or shortness of breath. Dizziness. These symptoms may be an emergency. Get help right away. Call 911. Do not wait to see if the symptoms will go away. Do not drive yourself to the hospital. This information is not intended to replace advice given to you by your health care provider. Make sure you discuss any questions you have with your health care provider. Document Revised: 05/21/2022 Document Reviewed: 05/21/2022 Elsevier Patient Education  2024 [employer].

## 2024-02-03 DIAGNOSIS — G4733 Obstructive sleep apnea (adult) (pediatric): Secondary | ICD-10-CM | POA: Diagnosis not present

## 2024-02-04 ENCOUNTER — Other Ambulatory Visit (HOSPITAL_COMMUNITY): Payer: Self-pay

## 2024-02-05 ENCOUNTER — Other Ambulatory Visit (HOSPITAL_COMMUNITY): Payer: Self-pay

## 2024-02-10 ENCOUNTER — Other Ambulatory Visit (HOSPITAL_COMMUNITY): Payer: Self-pay

## 2024-02-10 MED ORDER — PROGESTERONE MICRONIZED 100 MG PO CAPS
100.0000 mg | ORAL_CAPSULE | Freq: Every day | ORAL | 2 refills | Status: AC
  Filled 2024-02-10: qty 20, 20d supply, fill #0
  Filled 2024-02-10: qty 10, 10d supply, fill #0
  Filled 2024-03-13: qty 30, 30d supply, fill #1
  Filled 2024-09-14 – 2024-09-15 (×2): qty 30, 30d supply, fill #2

## 2024-02-10 MED ORDER — ESTRADIOL 0.05 MG/24HR TD PTTW
1.0000 | MEDICATED_PATCH | TRANSDERMAL | 2 refills | Status: AC
  Filled 2024-02-10: qty 8, 28d supply, fill #0
  Filled 2024-03-13: qty 8, 28d supply, fill #1

## 2024-02-12 ENCOUNTER — Other Ambulatory Visit (HOSPITAL_COMMUNITY)
Admission: RE | Admit: 2024-02-12 | Discharge: 2024-02-12 | Disposition: A | Discharge status: Home / Self Care | Source: Ambulatory Visit

## 2024-02-12 DIAGNOSIS — N951 Menopausal and female climacteric states: Secondary | ICD-10-CM | POA: Diagnosis not present

## 2024-02-12 DIAGNOSIS — G4700 Insomnia, unspecified: Secondary | ICD-10-CM | POA: Diagnosis not present

## 2024-02-12 DIAGNOSIS — R5383 Other fatigue: Secondary | ICD-10-CM | POA: Insufficient documentation

## 2024-02-13 LAB — TESTOSTERONE: Testosterone: 18 ng/dL (ref 4–50)

## 2024-02-13 LAB — MISC LABCORP TEST (SEND OUT): Labcorp test code: 34280

## 2024-02-14 DIAGNOSIS — G4733 Obstructive sleep apnea (adult) (pediatric): Secondary | ICD-10-CM | POA: Diagnosis not present

## 2024-02-21 ENCOUNTER — Encounter (HOSPITAL_COMMUNITY)
Admission: RE | Admit: 2024-02-21 | Discharge: 2024-02-21 | Disposition: A | Discharge status: Home / Self Care | Source: Ambulatory Visit | Attending: Gastroenterology | Admitting: Gastroenterology

## 2024-02-21 DIAGNOSIS — R1011 Right upper quadrant pain: Secondary | ICD-10-CM | POA: Insufficient documentation

## 2024-02-21 MED ORDER — TECHNETIUM TC 99M MEBROFENIN IV KIT: 5.0000 | PACK | Freq: Once | INTRAVENOUS | Status: DC | PRN

## 2024-02-26 NOTE — Progress Notes (Unsigned)
 6//25/24- 53 yoF never smoker for sleep evaluation courtesy of Dr Charmel Cooter with concern of OSA Medical problem list includes Migraine, Asthma, GERD, IBS, DM2, Hyperlipidemia,  Epworth score-7 Body weight today- Sleep study 2010-2012. ? Dr Corita Diego. On CPAP [facility] Started CPAP 4 yrs ago after severe flu. C/O mask leak. Snores through her nasal mask.  Had ENT procedure for sinus infection, possibly septoplasty in the past.  Wakes tired.  Taking Seroquel  and Ambien  12.5 mg CR.  Little caffeine.   I cannot tell from her that she is aware of waking much at night- maybe 2 or 3 times.  Estimates bedtime between 9 and 11 PM, needing 30 to 90 minutes to fall asleep and waking about 6:30 AM.  Has gained weight in the last few years. She thinks current machine is about 52 years old..  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 Had asthma only with a respiratory infection.  Denies heart problems.  Has not used inhalers in 3 years  02/27/24- 53 yoF never smoker for sleep evaluation courtesy of Dr Charmel Cooter with concern of OSA Medical problem list includes Migraine, Asthma, GERD, IBS, DM2, Hyperlipidemia,  Body weight today-212 lbs ?? Updated sleep study after last visit?? Downoad compliance- 70%, AHI 1.7/hr Mostly recovered from L TKR which impacted sleep for a while. Download reviewed. Discussed the use of AI scribe software for clinical note transcription with the patient, who gave verbal consent to proceed.  History of Present Illness   Julie Hansen is a 53 year old female with sleep apnea who presents for CPAP management and follow-up.  She uses a CPAP machine with a nasal pillow mask but prefers a different type of mask. The CPAP is set to a pressure range of five to twenty centimeters of water, with most usage around nine to ten centimeters, resulting in fewer than two breakthrough apneas per hour. She experiences occasional tiredness, which she attributes  to her recent total knee replacement. There are some short nights of CPAP usage and a few skipped nights due to her sleep patterns.  She has asthma that began after contracting H1N1 but has no current symptoms and has not used her inhalers recently.     Assessment and Plan:    Obstructive sleep apnea Obstructive sleep apnea well-managed with CPAP. She requested a mask change due to comfort preferences. Current settings effective with minimal breakthrough apneas. - Request [facility] to change CPAP mask to prone type. - Maintain current CPAP pressure settings.  Dry mouth Dry mouth likely due to CPAP use and orthodontic treatment. Persistent symptoms noted. - Advise increasing humidifier setting on CPAP machine. - Recommend Biotene mouthwash for residual moisture. - Suggest trying Xylimelts for additional moisture retention.  Asthma Asthma asymptomatic, no inhaler use.  Diabetes mellitus, unspecified Diabetes mellitus not addressed in this encounter.  Total knee replacement Post-operative improvement noted with occasional swelling and stiffness. No further surgery needed.     ROS-see HPI   + = positive Constitutional:    weight loss, night sweats, fevers, chills, fatigue, lassitude. HEENT:    headaches, difficulty swallowing, tooth/dental problems, sore throat,       sneezing, itching, ear ache, nasal congestion, post nasal drip, snoring CV:    chest pain, orthopnea, PND, swelling in lower extremities, anasarca,  dizziness, palpitations Resp:   shortness of breath with exertion or at rest.                productive cough,   non-productive cough, coughing up of blood.              change in color of mucus.  wheezing.   Skin:    rash or lesions. GI:  No-   heartburn, indigestion, abdominal pain, nausea, vomiting, diarrhea,                 change in bowel habits, loss of appetite GU: dysuria, change in color of urine, no urgency or  frequency.   flank pain. MS:   joint pain, stiffness, decreased range of motion, back pain. Neuro-     nothing unusual Psych:  change in mood or affect.  depression or anxiety.   memory loss.  OBJ- Physical Exam General- Alert, Oriented, Affect-appropriate, Distress- none acute, + obese Skin- rash-none, lesions- none, excoriation- none Lymphadenopathy- none Head- atraumatic            Eyes- Gross vision intact, PERRLA, conjunctivae and secretions clear            Ears- Hearing, canals-normal            Nose- Clear, no-Septal dev, mucus, polyps, erosion, perforation             Throat- Mallampati IV , mucosa clear , drainage- none, tonsils- atrophic, +teeth/ + braces Neck- flexible , trachea midline, no stridor , thyroid  nl, carotid no bruit Chest - symmetrical excursion , unlabored           Heart/CV- RRR , no murmur , no gallop  , no rub, nl s1 s2                           - JVD- none , edema- none, stasis changes- none, varices- none           Lung- clear to P&A, wheeze- none, cough- none , dullness-none, rub- none           Chest wall-  Abd-  Br/ Gen/ Rectal- Not done, not indicated Extrem- cyanosis- none, clubbing, none, atrophy- none, strength- nl Neuro- grossly intact to observation

## 2024-02-27 ENCOUNTER — Encounter: Payer: Self-pay | Admitting: Internal Medicine

## 2024-02-27 ENCOUNTER — Ambulatory Visit: Admitting: Internal Medicine

## 2024-02-27 VITALS — BP 112/68 | HR 85 | Ht 64.0 in | Wt 212.6 lb

## 2024-02-27 DIAGNOSIS — Z9981 Dependence on supplemental oxygen: Secondary | ICD-10-CM

## 2024-02-27 DIAGNOSIS — G4733 Obstructive sleep apnea (adult) (pediatric): Secondary | ICD-10-CM

## 2024-02-27 NOTE — Patient Instructions (Signed)
 Order- DME [facility] please let patient try nasal pillows mask, or mask of choice, continue auto 5-20, heated humidity, supplies, Air view/ card  For dry mouth You can turn u the humidifier on your CPAP a little  Try Biotene mouth wash at bedtime  Try otc Xylimelts

## 2024-02-29 ENCOUNTER — Other Ambulatory Visit (HOSPITAL_COMMUNITY): Payer: Self-pay

## 2024-03-02 ENCOUNTER — Other Ambulatory Visit (HOSPITAL_COMMUNITY): Payer: Self-pay

## 2024-03-02 MED ORDER — POTASSIUM CHLORIDE ER 20 MEQ PO TBCR
1.0000 | EXTENDED_RELEASE_TABLET | Freq: Every day | ORAL | 3 refills | Status: AC
  Filled 2024-03-02 – 2024-03-31 (×2): qty 90, 90d supply, fill #0
  Filled 2024-07-09: qty 30, 30d supply, fill #1
  Filled 2024-08-08: qty 90, 90d supply, fill #2
  Filled 2024-08-15 – 2024-09-03 (×2): qty 30, 30d supply, fill #2
  Filled 2024-10-05: qty 30, 30d supply, fill #3
  Filled 2024-10-30 (×2): qty 30, 30d supply, fill #4

## 2024-03-02 MED ORDER — ALPRAZOLAM 1 MG PO TABS
1.0000 mg | ORAL_TABLET | Freq: Two times a day (BID) | ORAL | 0 refills | Status: DC
  Filled 2024-03-02: qty 60, 30d supply, fill #0

## 2024-03-02 MED ORDER — CYCLOBENZAPRINE HCL 10 MG PO TABS
10.0000 mg | ORAL_TABLET | Freq: Three times a day (TID) | ORAL | 1 refills | Status: DC
  Filled 2024-03-02: qty 30, 10d supply, fill #0
  Filled 2024-03-13: qty 30, 10d supply, fill #1

## 2024-03-03 ENCOUNTER — Other Ambulatory Visit (HOSPITAL_COMMUNITY): Payer: Self-pay

## 2024-03-05 DIAGNOSIS — G4733 Obstructive sleep apnea (adult) (pediatric): Secondary | ICD-10-CM | POA: Diagnosis not present

## 2024-03-06 ENCOUNTER — Other Ambulatory Visit (HOSPITAL_COMMUNITY): Payer: Self-pay

## 2024-03-06 MED ORDER — GOLYTELY 236 G PO SOLR
ORAL | 0 refills | Status: AC
  Filled 2024-03-06 – 2024-09-14 (×2): qty 4000, 1d supply, fill #0

## 2024-03-07 ENCOUNTER — Other Ambulatory Visit (HOSPITAL_COMMUNITY): Payer: Self-pay

## 2024-03-13 ENCOUNTER — Other Ambulatory Visit: Payer: Self-pay

## 2024-03-13 ENCOUNTER — Other Ambulatory Visit (HOSPITAL_COMMUNITY): Payer: Self-pay

## 2024-03-13 MED ORDER — MELOXICAM 15 MG PO TABS
15.0000 mg | ORAL_TABLET | Freq: Every day | ORAL | 1 refills | Status: AC
  Filled 2024-03-13: qty 30, 30d supply, fill #0
  Filled 2024-06-05: qty 30, 30d supply, fill #1

## 2024-03-14 ENCOUNTER — Other Ambulatory Visit (HOSPITAL_COMMUNITY): Payer: Self-pay

## 2024-03-14 MED ORDER — FREESTYLE LIBRE 3 PLUS SENSOR MISC
1.0000 | 5 refills | Status: AC
  Filled 2024-03-14: qty 6, 84d supply, fill #0
  Filled 2024-06-05: qty 6, 84d supply, fill #1
  Filled 2024-09-03: qty 6, 90d supply, fill #2
  Filled 2024-09-03 – 2024-09-15 (×4): qty 6, 84d supply, fill #2

## 2024-03-16 DIAGNOSIS — G4733 Obstructive sleep apnea (adult) (pediatric): Secondary | ICD-10-CM | POA: Diagnosis not present

## 2024-03-17 ENCOUNTER — Other Ambulatory Visit (HOSPITAL_COMMUNITY): Payer: Self-pay

## 2024-03-19 ENCOUNTER — Other Ambulatory Visit (HOSPITAL_COMMUNITY): Payer: Self-pay

## 2024-03-19 ENCOUNTER — Encounter (HOSPITAL_BASED_OUTPATIENT_CLINIC_OR_DEPARTMENT_OTHER): Payer: Self-pay | Admitting: General Surgery

## 2024-03-19 NOTE — OR Nursing (Signed)
 Late entry:  Due to an error with the procedure names related to radioactive seeds, this procedure was documented incorrectly the day of surgery.  I reviewed the operative note dictated by the surgeon and correct the OR record to match the procedure that was completed. Berwyn Eagles, RN.

## 2024-03-20 DIAGNOSIS — Z860100 Personal history of colon polyps, unspecified: Secondary | ICD-10-CM | POA: Diagnosis not present

## 2024-03-20 DIAGNOSIS — K5730 Diverticulosis of large intestine without perforation or abscess without bleeding: Secondary | ICD-10-CM | POA: Diagnosis not present

## 2024-03-20 DIAGNOSIS — Z860101 Personal history of adenomatous and serrated colon polyps: Secondary | ICD-10-CM | POA: Diagnosis not present

## 2024-03-20 DIAGNOSIS — Z09 Encounter for follow-up examination after completed treatment for conditions other than malignant neoplasm: Secondary | ICD-10-CM | POA: Diagnosis not present

## 2024-03-20 DIAGNOSIS — Z1211 Encounter for screening for malignant neoplasm of colon: Secondary | ICD-10-CM | POA: Diagnosis not present

## 2024-03-26 ENCOUNTER — Other Ambulatory Visit (HOSPITAL_COMMUNITY): Payer: Self-pay

## 2024-03-27 ENCOUNTER — Other Ambulatory Visit (HOSPITAL_COMMUNITY): Payer: Self-pay

## 2024-03-30 ENCOUNTER — Other Ambulatory Visit (HOSPITAL_COMMUNITY): Payer: Self-pay

## 2024-03-31 ENCOUNTER — Other Ambulatory Visit: Payer: Self-pay

## 2024-03-31 ENCOUNTER — Other Ambulatory Visit (HOSPITAL_COMMUNITY): Payer: Self-pay

## 2024-03-31 ENCOUNTER — Other Ambulatory Visit: Payer: Self-pay | Admitting: Physician Assistant

## 2024-03-31 DIAGNOSIS — G43009 Migraine without aura, not intractable, without status migrainosus: Secondary | ICD-10-CM

## 2024-03-31 MED ORDER — LINZESS 290 MCG PO CAPS
290.0000 ug | ORAL_CAPSULE | Freq: Every day | ORAL | 3 refills | Status: AC
  Filled 2024-03-31: qty 90, 90d supply, fill #0
  Filled 2024-08-15: qty 90, 90d supply, fill #1

## 2024-04-01 ENCOUNTER — Other Ambulatory Visit (HOSPITAL_COMMUNITY): Payer: Self-pay

## 2024-04-01 MED ORDER — CYCLOBENZAPRINE HCL 10 MG PO TABS
10.0000 mg | ORAL_TABLET | Freq: Three times a day (TID) | ORAL | 1 refills | Status: DC
  Filled 2024-04-01: qty 30, 10d supply, fill #0
  Filled 2024-08-08 – 2024-08-15 (×2): qty 30, 10d supply, fill #1

## 2024-04-01 MED ORDER — ALPRAZOLAM 1 MG PO TABS
0.5000 mg | ORAL_TABLET | ORAL | 0 refills | Status: DC
  Filled 2024-04-01: qty 60, 30d supply, fill #0

## 2024-04-02 ENCOUNTER — Other Ambulatory Visit (HOSPITAL_COMMUNITY): Payer: Self-pay

## 2024-04-03 ENCOUNTER — Other Ambulatory Visit (HOSPITAL_COMMUNITY): Payer: Self-pay

## 2024-04-03 MED ORDER — PROMETHAZINE HCL 25 MG PO TABS
25.0000 mg | ORAL_TABLET | Freq: Four times a day (QID) | ORAL | 1 refills | Status: DC | PRN
  Filled 2024-04-03: qty 30, 8d supply, fill #0
  Filled 2024-05-11: qty 30, 8d supply, fill #1

## 2024-04-04 ENCOUNTER — Other Ambulatory Visit (HOSPITAL_COMMUNITY): Payer: Self-pay

## 2024-04-07 ENCOUNTER — Other Ambulatory Visit (HOSPITAL_COMMUNITY): Payer: Self-pay

## 2024-04-07 MED ORDER — ESTRADIOL 0.075 MG/24HR TD PTTW
1.0000 | MEDICATED_PATCH | TRANSDERMAL | 0 refills | Status: AC
  Filled 2024-04-07: qty 24, 84d supply, fill #0

## 2024-04-07 MED ORDER — PROGESTERONE 200 MG PO CAPS
200.0000 mg | ORAL_CAPSULE | Freq: Every day | ORAL | 0 refills | Status: AC
  Filled 2024-04-07: qty 90, 90d supply, fill #0

## 2024-04-11 ENCOUNTER — Other Ambulatory Visit (HOSPITAL_COMMUNITY): Payer: Self-pay

## 2024-04-11 ENCOUNTER — Other Ambulatory Visit: Payer: Self-pay | Admitting: Physician Assistant

## 2024-04-13 ENCOUNTER — Other Ambulatory Visit (HOSPITAL_COMMUNITY): Payer: Self-pay

## 2024-04-13 MED ORDER — TRAMADOL HCL 50 MG PO TABS
50.0000 mg | ORAL_TABLET | Freq: Two times a day (BID) | ORAL | 1 refills | Status: DC | PRN
  Filled 2024-04-13: qty 30, 15d supply, fill #0
  Filled 2024-05-11: qty 30, 15d supply, fill #1

## 2024-04-13 MED ORDER — INSULIN LISPRO (1 UNIT DIAL) 100 UNIT/ML (KWIKPEN)
PEN_INJECTOR | SUBCUTANEOUS | 6 refills | Status: AC
  Filled 2024-04-13: qty 15, 20d supply, fill #0

## 2024-04-13 MED ORDER — TRESIBA FLEXTOUCH 200 UNIT/ML ~~LOC~~ SOPN
PEN_INJECTOR | SUBCUTANEOUS | 5 refills | Status: AC
  Filled 2024-04-13: qty 45, 75d supply, fill #0

## 2024-04-15 DIAGNOSIS — G4733 Obstructive sleep apnea (adult) (pediatric): Secondary | ICD-10-CM | POA: Diagnosis not present

## 2024-04-16 ENCOUNTER — Other Ambulatory Visit (HOSPITAL_COMMUNITY)
Admission: RE | Admit: 2024-04-16 | Discharge: 2024-04-16 | Disposition: A | Discharge status: Home / Self Care | Source: Ambulatory Visit | Attending: Endocrinology | Admitting: Endocrinology

## 2024-04-16 DIAGNOSIS — E1165 Type 2 diabetes mellitus with hyperglycemia: Secondary | ICD-10-CM | POA: Insufficient documentation

## 2024-04-16 LAB — COMPREHENSIVE METABOLIC PANEL WITH GFR
ALT: 21 U/L (ref 0–44)
AST: 24 U/L (ref 15–41)
Albumin: 3.2 g/dL — ABNORMAL LOW (ref 3.5–5.0)
Alkaline Phosphatase: 93 U/L (ref 38–126)
Anion gap: 10 (ref 5–15)
BUN: 14 mg/dL (ref 6–20)
CO2: 26 mmol/L (ref 22–32)
Calcium: 8.6 mg/dL — ABNORMAL LOW (ref 8.9–10.3)
Chloride: 97 mmol/L — ABNORMAL LOW (ref 98–111)
Creatinine, Ser: 0.71 mg/dL (ref 0.44–1.00)
GFR, Estimated: >60 mL/min (ref >60)
Glucose, Bld: 106 mg/dL — ABNORMAL HIGH (ref 70–99)
Potassium: 3.3 mmol/L — ABNORMAL LOW (ref 3.5–5.1)
Sodium: 133 mmol/L — ABNORMAL LOW (ref 135–145)
Total Bilirubin: 0.6 mg/dL (ref 0.0–1.2)
Total Protein: 7.3 g/dL (ref 6.5–8.1)

## 2024-04-16 LAB — LIPID PANEL
Cholesterol: 175 mg/dL (ref 0–200)
HDL: 64 mg/dL (ref >40)
LDL Cholesterol: 83 mg/dL (ref 0–99)
Total CHOL/HDL Ratio: 2.7 ratio
Triglycerides: 138 mg/dL (ref <150)
VLDL: 28 mg/dL (ref 0–40)

## 2024-04-16 LAB — HEMOGLOBIN A1C
Hgb A1c MFr Bld: 6.3 % — ABNORMAL HIGH (ref 4.8–5.6)
Mean Plasma Glucose: 134.11 mg/dL

## 2024-04-17 ENCOUNTER — Other Ambulatory Visit: Payer: Self-pay

## 2024-04-17 ENCOUNTER — Other Ambulatory Visit (HOSPITAL_COMMUNITY): Payer: Self-pay

## 2024-04-17 ENCOUNTER — Other Ambulatory Visit (HOSPITAL_BASED_OUTPATIENT_CLINIC_OR_DEPARTMENT_OTHER): Payer: Self-pay

## 2024-04-17 DIAGNOSIS — E1165 Type 2 diabetes mellitus with hyperglycemia: Secondary | ICD-10-CM | POA: Diagnosis not present

## 2024-04-17 DIAGNOSIS — I10 Essential (primary) hypertension: Secondary | ICD-10-CM | POA: Diagnosis not present

## 2024-04-17 DIAGNOSIS — E7800 Pure hypercholesterolemia, unspecified: Secondary | ICD-10-CM | POA: Diagnosis not present

## 2024-04-17 MED ORDER — BD PEN NEEDLE NANO 2ND GEN 32G X 4 MM MISC
5.0000 | Freq: Every day | 5 refills | Status: AC
  Filled 2024-04-17: qty 400, 80d supply, fill #0
  Filled 2024-05-02: qty 200, 40d supply, fill #0

## 2024-04-18 ENCOUNTER — Other Ambulatory Visit (HOSPITAL_COMMUNITY): Payer: Self-pay

## 2024-04-20 ENCOUNTER — Other Ambulatory Visit: Payer: Self-pay

## 2024-04-27 ENCOUNTER — Other Ambulatory Visit (HOSPITAL_COMMUNITY): Payer: Self-pay

## 2024-04-27 ENCOUNTER — Other Ambulatory Visit: Payer: Self-pay

## 2024-04-27 MED ORDER — REYVOW 100 MG PO TABS
100.0000 mg | ORAL_TABLET | ORAL | 1 refills | Status: DC | PRN
  Filled 2024-04-27: qty 30, 30d supply, fill #0
  Filled 2024-05-30: qty 16, 60d supply, fill #0
  Filled 2024-08-08: qty 16, 60d supply, fill #1

## 2024-04-27 MED ORDER — MELOXICAM 15 MG PO TABS
15.0000 mg | ORAL_TABLET | Freq: Every day | ORAL | 1 refills | Status: DC
  Filled 2024-04-27: qty 30, 30d supply, fill #0
  Filled 2024-07-09: qty 30, 30d supply, fill #1

## 2024-04-28 ENCOUNTER — Other Ambulatory Visit (HOSPITAL_COMMUNITY): Payer: Self-pay

## 2024-04-30 ENCOUNTER — Other Ambulatory Visit: Payer: Self-pay

## 2024-04-30 NOTE — Progress Notes (Signed)
 Specialty Pharmacy Refill Coordination Note  Julie Hansen is a 53 y.o. female contacted today regarding refills of specialty medication(s) OnabotulinumtoxinA  (Botox )   Patient requested Courier to Provider Office   Delivery date: 04/30/24   Verified address: [facility] [employer] [facility] [employer]   Medication will be filled on 04/30/24.

## 2024-05-01 ENCOUNTER — Ambulatory Visit: Admitting: Physician Assistant

## 2024-05-01 ENCOUNTER — Other Ambulatory Visit (HOSPITAL_COMMUNITY): Payer: Self-pay

## 2024-05-01 ENCOUNTER — Encounter: Payer: Self-pay | Admitting: Physician Assistant

## 2024-05-01 ENCOUNTER — Other Ambulatory Visit: Payer: Self-pay | Admitting: Physician Assistant

## 2024-05-01 VITALS — BP 115/75 | HR 81 | Wt 227.0 lb

## 2024-05-01 DIAGNOSIS — M62838 Other muscle spasm: Secondary | ICD-10-CM

## 2024-05-01 DIAGNOSIS — G43709 Chronic migraine without aura, not intractable, without status migrainosus: Secondary | ICD-10-CM

## 2024-05-01 MED ORDER — ONABOTULINUMTOXINA 200 UNITS IJ SOLR
200.0000 [IU] | Freq: Once | INTRAMUSCULAR | Status: AC
  Administered 2024-05-01: 200 [IU] via INTRAMUSCULAR

## 2024-05-01 MED ORDER — BACLOFEN 10 MG PO TABS
10.0000 mg | ORAL_TABLET | ORAL | 2 refills | Status: DC | PRN
  Filled 2024-05-01: qty 30, 30d supply, fill #0
  Filled 2024-06-05: qty 30, 30d supply, fill #1

## 2024-05-01 MED ORDER — GOLYTELY 236 G PO SOLR
ORAL | 0 refills | Status: AC
  Filled 2024-05-01: qty 4000, 1d supply, fill #0

## 2024-05-01 NOTE — Progress Notes (Signed)
 Patient here for Botox  injection.  S: Pt in office today for Botox  injections. Her Botox  has been working very well for migraine prevention overall.  She is a month overdue this time.  She has been getting more headaches lately but isn't really sure about the timing.  She also started Peripause - and they started her on hormonal therapies.  She wonders if that is affecting her HA.   She is still working in [place] and is considering a change.     O: BP 115/75   Pulse 81   Wt 227 lb (103 kg)   BMI 38.96 kg/m    Botox  Procedure Note Vial of Botox  was : Supplied by Patient   Botox  Dosing by [facility] for Chronic Migraine   Injection Sites for Migraines  Botox  200 units vial utilized: 155 units were injected using the dosage in the table above in the pattern shown above.45 units wasted.  A: Migraine  Muscle spasm   P: Botox  155 units injected today.  RTC 3 Months.

## 2024-05-01 NOTE — Patient Instructions (Signed)
Botulinum Toxin Cosmetic Injection Botulinum toxin injection is a shot in the skin that is given to soften lines and wrinkles in the face. The medicine works by weakening the muscles in the face, which reduces the wrinkles when you move your face. Tell a doctor about: Any allergies you have, especially if you are allergic to eggs or milk. All the medicines you take. This includes: Antibiotics. Vitamins. Herbs. Eye drops. Creams. Over-the-counter medicines. Any bleeding problems you have. Any surgeries you have had. Any medical conditions you have. Whether you are pregnant or may be pregnant. Whether you are breastfeeding. What are the risks? Your doctor will talk with you about risks. These may include: Pain in the jaw. Pain in the neck. Allergic reaction to the shot. Infection. Drooping eyebrow or eyelid. Not being able to close one or both eyes. Double vision. Blurry vision. Changes in voice or speech. Trouble swallowing. What happens before the procedure? Ask your doctor about changing or stopping: Your normal medicines. Vitamins, herbs, and supplements. Over-the-counter medicines. Do not take aspirin or ibuprofen unless you are told to. Do not drink alcohol if your doctor tells you not to drink. What happens during the procedure?  The area to be treated may be: Chilled with ice. Numbed with medicine. Your doctor will give you some shots of the toxin. How many shots you get depends on: Your condition. Which area is being treated. The procedure may vary among doctors and hospitals. What can I expect after the procedure? After these shots, it is common to have: Pain, soreness, swelling, itching, or redness on your face. Small bruises from the needle. Bumps or marks from the needle. Loss of feeling in areas where the needle entered the body. Headache. This is rare. It may take up to 14 days for the treatment to work. It may work sooner for some people. Results will  last for about 3-4 months. Follow-up treatment will make this last longer. Follow these instructions at home: Managing pain and discomfort  Take over-the-counter and prescription medicines only as told by your doctor. If told, put ice on the affected area: Put ice in a plastic bag. Place a towel between your skin and the bag. Leave the ice on for 20 minutes, 2-3 times per day. If your skin turns bright red, take off the ice right away to prevent skin damage. The risk of damage is higher if you cannot feel pain, heat, or cold. Activity Do not do activities that take a lot of effort. Avoid these activities for 12 hours after the procedure or for as long as told by your doctor. This includes: Not lifting heavy items. Not working out. Not doing activities that make your heart beat faster. General instructions Do not lie down for 4 hours after treatment or as told by your doctor. Do not rub the area where you got the shot. This can spread the toxin. Depending on where the shot was given: Your doctor may ask you not to move your muscles in that area. Your doctor may tell you to frown or squint regularly. You may need to do these exercises every 15 minutes for 1 hour after the shots. Follow what the doctor tells you. Do not get laser treatments, facials, or facial massages for 1-2 weeks after the procedure or for as long as told by your doctor. Contact a doctor if: You have a headache that gets worse. You have a fever. Your eyelids start to get droopy or swollen. You have trouble  speaking. You have trouble swallowing. Get help right away if: You have signs of an allergic reaction. These include: An itchy rash or welts. Wheezing. You feel short of breath or have trouble breathing. Dizziness. These symptoms may be an emergency. Get help right away. Call 911. Do not wait to see if the symptoms will go away. Do not drive yourself to the hospital. This information is not intended to replace  advice given to you by your health care provider. Make sure you discuss any questions you have with your health care provider. Document Revised: 05/21/2022 Document Reviewed: 05/21/2022 Elsevier Patient Education  2024 [employer].

## 2024-05-02 ENCOUNTER — Other Ambulatory Visit (HOSPITAL_COMMUNITY): Payer: Self-pay

## 2024-05-05 ENCOUNTER — Encounter: Payer: Self-pay | Admitting: Orthopaedic Surgery

## 2024-05-05 ENCOUNTER — Other Ambulatory Visit: Payer: Self-pay

## 2024-05-06 ENCOUNTER — Other Ambulatory Visit: Payer: Self-pay | Admitting: Orthopaedic Surgery

## 2024-05-07 ENCOUNTER — Other Ambulatory Visit (HOSPITAL_COMMUNITY): Payer: Self-pay

## 2024-05-07 DIAGNOSIS — E119 Type 2 diabetes mellitus without complications: Secondary | ICD-10-CM | POA: Diagnosis not present

## 2024-05-07 DIAGNOSIS — Z794 Long term (current) use of insulin: Secondary | ICD-10-CM | POA: Diagnosis not present

## 2024-05-07 DIAGNOSIS — E876 Hypokalemia: Secondary | ICD-10-CM | POA: Diagnosis not present

## 2024-05-07 MED ORDER — FLUOXETINE HCL 10 MG PO CAPS
10.0000 mg | ORAL_CAPSULE | Freq: Every day | ORAL | 2 refills | Status: DC
  Filled 2024-05-07: qty 90, 90d supply, fill #0
  Filled 2024-08-08 – 2024-08-15 (×2): qty 90, 90d supply, fill #1

## 2024-05-11 ENCOUNTER — Other Ambulatory Visit (HOSPITAL_COMMUNITY): Payer: Self-pay

## 2024-05-11 ENCOUNTER — Other Ambulatory Visit: Payer: Self-pay | Admitting: Physician Assistant

## 2024-05-12 ENCOUNTER — Other Ambulatory Visit: Payer: Self-pay

## 2024-05-12 ENCOUNTER — Other Ambulatory Visit (HOSPITAL_COMMUNITY): Payer: Self-pay

## 2024-05-12 MED ORDER — ALPRAZOLAM 1 MG PO TABS
0.5000 mg | ORAL_TABLET | Freq: Two times a day (BID) | ORAL | 0 refills | Status: DC
  Filled 2024-05-12: qty 60, 30d supply, fill #0

## 2024-05-15 DIAGNOSIS — Z09 Encounter for follow-up examination after completed treatment for conditions other than malignant neoplasm: Secondary | ICD-10-CM | POA: Diagnosis not present

## 2024-05-15 DIAGNOSIS — K5730 Diverticulosis of large intestine without perforation or abscess without bleeding: Secondary | ICD-10-CM | POA: Diagnosis not present

## 2024-05-15 DIAGNOSIS — Z860100 Personal history of colon polyps, unspecified: Secondary | ICD-10-CM | POA: Diagnosis not present

## 2024-05-15 DIAGNOSIS — Z1211 Encounter for screening for malignant neoplasm of colon: Secondary | ICD-10-CM | POA: Diagnosis not present

## 2024-05-15 DIAGNOSIS — G4733 Obstructive sleep apnea (adult) (pediatric): Secondary | ICD-10-CM | POA: Diagnosis not present

## 2024-05-15 DIAGNOSIS — F419 Anxiety disorder, unspecified: Secondary | ICD-10-CM | POA: Diagnosis not present

## 2024-05-20 ENCOUNTER — Other Ambulatory Visit: Payer: Self-pay | Admitting: Physician Assistant

## 2024-05-20 ENCOUNTER — Other Ambulatory Visit (HOSPITAL_COMMUNITY): Payer: Self-pay

## 2024-05-21 ENCOUNTER — Ambulatory Visit: Admitting: Orthopaedic Surgery

## 2024-05-26 ENCOUNTER — Ambulatory Visit: Admitting: Orthopaedic Surgery

## 2024-05-26 ENCOUNTER — Encounter: Payer: Self-pay | Admitting: Orthopaedic Surgery

## 2024-05-26 DIAGNOSIS — Z96652 Presence of left artificial knee joint: Secondary | ICD-10-CM

## 2024-05-26 NOTE — Progress Notes (Signed)
 Patient ID: Sintia Mckissic, female   DOB: 11-19-1970, 53 y.o.   MRN: [id number]  History of Present Illness Julie Hansen is a 53 year old female who presents for follow-up of her left knee post-surgery.  She is approximately nine months post-surgery and feels significantly better, estimating her recovery at about ninety percent. She experiences occasional achiness in the left knee, which is noticeable but not painful. Swelling was present at one point but has since resolved.  Recently had some increased pain around the time of a colonoscopy.  She denies any constitutional symptoms.  She feels like she is back to baseline.  Physical Exam MUSCULOSKELETAL: Left knee scar well-healed, no evidence of infection. Left knee flexibility normal.  Assessment and Plan Status post left knee surgery Nine months post-surgery with subjective 90% recovery. Occasional achiness, no significant pain, infection, or swelling. Good flexibility. - Schedule follow-up appointment for December for one-year post-surgery evaluation. -No concern of infection from my standpoint.

## 2024-05-28 ENCOUNTER — Other Ambulatory Visit (HOSPITAL_COMMUNITY): Payer: Self-pay

## 2024-05-29 ENCOUNTER — Other Ambulatory Visit (HOSPITAL_COMMUNITY): Payer: Self-pay

## 2024-05-29 MED ORDER — ZOLPIDEM TARTRATE ER 12.5 MG PO TBCR
EXTENDED_RELEASE_TABLET | ORAL | 0 refills | Status: DC
  Filled 2024-05-29: qty 90, 90d supply, fill #0

## 2024-05-29 MED ORDER — IBUPROFEN 800 MG PO TABS
800.0000 mg | ORAL_TABLET | Freq: Three times a day (TID) | ORAL | 1 refills | Status: DC | PRN
  Filled 2024-05-29: qty 46, 15d supply, fill #0
  Filled 2024-05-29: qty 14, 5d supply, fill #0

## 2024-05-30 ENCOUNTER — Other Ambulatory Visit (HOSPITAL_COMMUNITY): Payer: Self-pay

## 2024-06-05 ENCOUNTER — Other Ambulatory Visit: Payer: Self-pay | Admitting: Physician Assistant

## 2024-06-05 ENCOUNTER — Telehealth: Payer: Self-pay | Admitting: *Deleted

## 2024-06-05 ENCOUNTER — Other Ambulatory Visit (HOSPITAL_COMMUNITY): Payer: Self-pay

## 2024-06-05 ENCOUNTER — Other Ambulatory Visit: Payer: Self-pay

## 2024-06-05 MED ORDER — BOTOX 200 UNITS IJ SOLR
200.0000 [IU] | INTRAMUSCULAR | 2 refills | Status: AC
  Filled 2024-06-05 – 2024-07-13 (×2): qty 1, 90d supply, fill #0
  Filled 2024-07-29: qty 1, 30d supply, fill #0
  Filled 2024-07-30: qty 1, 90d supply, fill #0
  Filled 2024-08-03 – 2024-08-05 (×2): qty 1, 30d supply, fill #0

## 2024-06-05 MED ORDER — TRAMADOL HCL 50 MG PO TABS
50.0000 mg | ORAL_TABLET | Freq: Two times a day (BID) | ORAL | 1 refills | Status: DC | PRN
  Filled 2024-06-05: qty 30, 15d supply, fill #0
  Filled 2024-07-09: qty 30, 15d supply, fill #1

## 2024-06-05 MED ORDER — OMEPRAZOLE 40 MG PO CPDR
40.0000 mg | DELAYED_RELEASE_CAPSULE | Freq: Every day | ORAL | 1 refills | Status: DC
  Filled 2024-06-05: qty 90, 90d supply, fill #0
  Filled 2024-08-15 – 2024-10-05 (×3): qty 90, 90d supply, fill #1

## 2024-06-05 NOTE — Telephone Encounter (Signed)
 Refill sent in

## 2024-06-05 NOTE — Telephone Encounter (Signed)
-----   Message from [place] sent at 06/05/2024 11:26 AM EDT ----- Regarding: Medication Request Pt called in requesting Botox  prescription be sent in prior to her next appt in November.  Pharmacy : [facility]

## 2024-06-08 ENCOUNTER — Other Ambulatory Visit (HOSPITAL_COMMUNITY): Payer: Self-pay

## 2024-06-11 ENCOUNTER — Other Ambulatory Visit (HOSPITAL_COMMUNITY): Payer: Self-pay

## 2024-06-11 MED ORDER — DAPAGLIFLOZIN PROPANEDIOL 10 MG PO TABS
10.0000 mg | ORAL_TABLET | Freq: Every day | ORAL | 3 refills | Status: AC
  Filled 2024-06-11: qty 90, 90d supply, fill #0
  Filled 2024-08-15 – 2024-09-03 (×2): qty 30, 30d supply, fill #1
  Filled 2024-10-06 – 2024-10-30 (×4): qty 30, 30d supply, fill #2

## 2024-06-11 MED ORDER — MOUNJARO 15 MG/0.5ML ~~LOC~~ SOAJ
15.0000 mg | SUBCUTANEOUS | 5 refills | Status: AC
  Filled 2024-06-11: qty 2, 28d supply, fill #0
  Filled 2024-08-08: qty 2, 28d supply, fill #1
  Filled 2024-09-03: qty 2, 28d supply, fill #2
  Filled 2024-10-06 – 2024-10-30 (×3): qty 2, 28d supply, fill #3

## 2024-06-12 ENCOUNTER — Other Ambulatory Visit (HOSPITAL_COMMUNITY): Payer: Self-pay

## 2024-06-12 MED ORDER — ALPRAZOLAM 1 MG PO TABS
ORAL_TABLET | ORAL | 0 refills | Status: AC
  Filled 2024-06-13: qty 60, 30d supply, fill #0

## 2024-06-13 ENCOUNTER — Other Ambulatory Visit (HOSPITAL_COMMUNITY): Payer: Self-pay

## 2024-06-15 ENCOUNTER — Telehealth: Payer: Self-pay | Admitting: Orthopedic Surgery

## 2024-06-15 DIAGNOSIS — G4733 Obstructive sleep apnea (adult) (pediatric): Secondary | ICD-10-CM | POA: Diagnosis not present

## 2024-06-15 NOTE — Telephone Encounter (Signed)
 Julie Hansen called to see if she could get an extension on her temporary handicap placard.  She states that it expires soon (before she comes back for her December follow up appt).  She can be reached at [phone].

## 2024-06-15 NOTE — Telephone Encounter (Signed)
Sure 6 months.

## 2024-06-16 ENCOUNTER — Other Ambulatory Visit (HOSPITAL_COMMUNITY): Payer: Self-pay

## 2024-06-16 ENCOUNTER — Telehealth: Payer: Self-pay | Admitting: *Deleted

## 2024-06-16 DIAGNOSIS — G43009 Migraine without aura, not intractable, without status migrainosus: Secondary | ICD-10-CM

## 2024-06-16 MED ORDER — PROMETHAZINE HCL 25 MG PO TABS
25.0000 mg | ORAL_TABLET | Freq: Four times a day (QID) | ORAL | 1 refills | Status: DC | PRN
  Filled 2024-06-16: qty 60, 15d supply, fill #0
  Filled 2024-08-08: qty 60, 15d supply, fill #1

## 2024-06-16 MED ORDER — BACLOFEN 10 MG PO TABS
10.0000 mg | ORAL_TABLET | ORAL | 2 refills | Status: DC | PRN
  Filled 2024-06-16 – 2024-06-18 (×2): qty 120, 120d supply, fill #0
  Filled 2024-07-09: qty 90, 90d supply, fill #0
  Filled 2024-08-08: qty 90, 90d supply, fill #1

## 2024-06-16 MED ORDER — METOPROLOL SUCCINATE ER 50 MG PO TB24
50.0000 mg | ORAL_TABLET | Freq: Every day | ORAL | 1 refills | Status: AC
  Filled 2024-06-16 – 2024-08-15 (×3): qty 90, 90d supply, fill #0
  Filled 2024-10-30 (×2): qty 90, 90d supply, fill #1

## 2024-06-16 MED ORDER — IBUPROFEN 800 MG PO TABS
800.0000 mg | ORAL_TABLET | Freq: Three times a day (TID) | ORAL | 1 refills | Status: AC | PRN
  Filled 2024-06-16: qty 120, 40d supply, fill #0
  Filled 2024-09-14 – 2024-09-15 (×3): qty 120, 40d supply, fill #1

## 2024-06-16 NOTE — Telephone Encounter (Signed)
 Pt called requesting 90 day supply on her HA meds  Okay per Darice Greenland to send.   Wanda Buckles, RN

## 2024-06-18 ENCOUNTER — Other Ambulatory Visit (HOSPITAL_COMMUNITY): Payer: Self-pay

## 2024-07-07 ENCOUNTER — Other Ambulatory Visit (HOSPITAL_COMMUNITY): Payer: Self-pay

## 2024-07-07 MED ORDER — ESTRADIOL 0.075 MG/24HR TD PTTW
1.0000 | MEDICATED_PATCH | TRANSDERMAL | 1 refills | Status: AC
  Filled 2024-07-07: qty 8, 28d supply, fill #0

## 2024-07-09 ENCOUNTER — Other Ambulatory Visit: Payer: Self-pay

## 2024-07-09 ENCOUNTER — Other Ambulatory Visit (HOSPITAL_COMMUNITY): Payer: Self-pay

## 2024-07-10 ENCOUNTER — Other Ambulatory Visit: Payer: Self-pay

## 2024-07-10 ENCOUNTER — Other Ambulatory Visit (HOSPITAL_COMMUNITY): Payer: Self-pay

## 2024-07-12 ENCOUNTER — Other Ambulatory Visit (HOSPITAL_COMMUNITY): Payer: Self-pay

## 2024-07-12 MED ORDER — ALPRAZOLAM 1 MG PO TABS
0.5000 mg | ORAL_TABLET | Freq: Two times a day (BID) | ORAL | 0 refills | Status: DC | PRN
  Filled 2024-07-12: qty 60, 30d supply, fill #0

## 2024-07-13 ENCOUNTER — Other Ambulatory Visit (HOSPITAL_COMMUNITY): Payer: Self-pay

## 2024-07-15 ENCOUNTER — Other Ambulatory Visit (HOSPITAL_COMMUNITY): Payer: Self-pay

## 2024-07-16 ENCOUNTER — Other Ambulatory Visit (HOSPITAL_COMMUNITY): Payer: Self-pay

## 2024-07-23 ENCOUNTER — Other Ambulatory Visit (HOSPITAL_COMMUNITY): Payer: Self-pay

## 2024-07-27 ENCOUNTER — Other Ambulatory Visit: Payer: Self-pay

## 2024-07-27 ENCOUNTER — Encounter: Payer: Self-pay | Admitting: Radiology

## 2024-07-29 ENCOUNTER — Telehealth: Payer: Self-pay

## 2024-07-29 ENCOUNTER — Other Ambulatory Visit (HOSPITAL_COMMUNITY): Payer: Self-pay

## 2024-07-29 ENCOUNTER — Other Ambulatory Visit: Payer: Self-pay

## 2024-07-29 NOTE — Telephone Encounter (Signed)
 Pharmacy Patient Advocate Encounter   Received notification from Patient Pharmacy that prior authorization for Botox  is required/requested.   Insurance verification completed.   The patient is insured through [facility].   Per test claim: PA required; PA submitted to above mentioned insurance via Latent Key/confirmation #/EOC [id number] Status is pending

## 2024-07-30 ENCOUNTER — Other Ambulatory Visit: Payer: Self-pay

## 2024-07-30 ENCOUNTER — Other Ambulatory Visit (HOSPITAL_COMMUNITY): Payer: Self-pay

## 2024-07-31 ENCOUNTER — Other Ambulatory Visit: Payer: Self-pay

## 2024-07-31 ENCOUNTER — Encounter: Admitting: Physician Assistant

## 2024-07-31 ENCOUNTER — Other Ambulatory Visit (HOSPITAL_COMMUNITY): Payer: Self-pay

## 2024-08-03 ENCOUNTER — Other Ambulatory Visit: Payer: Self-pay

## 2024-08-03 ENCOUNTER — Telehealth: Payer: Self-pay

## 2024-08-03 NOTE — Telephone Encounter (Signed)
 Hi,   Julie Hansen has new insurance and her plan approved the PA for Botox , but it must be filed under her medical benefits. Can someone at the office get started on this process for her?

## 2024-08-03 NOTE — Telephone Encounter (Signed)
 It won't allow us  to fill through pharmacy. I called her insurance this morning and they said the office will need to submit to medical benefits. Are you guys able to do a buy and bill for her?

## 2024-08-03 NOTE — Telephone Encounter (Addendum)
 Pharmacy Patient Advocate Encounter  Received notification from [facility] that Prior Authorization for Botox  has been APPROVED from 08/03/24 to 01/14/25   PA #/Case ID/Reference #: [id number]

## 2024-08-03 NOTE — Telephone Encounter (Addendum)
 Patient's plan will only allow this to go through under medical benefit. Office will need to do buy and bill.

## 2024-08-05 ENCOUNTER — Other Ambulatory Visit: Payer: Self-pay

## 2024-08-05 NOTE — Progress Notes (Signed)
 Patient's insurance will not cover through pharmacy benefits. PA approved through medical. Office will do buy and bill. Dis-enrolling.

## 2024-08-08 ENCOUNTER — Other Ambulatory Visit (HOSPITAL_COMMUNITY): Payer: Self-pay

## 2024-08-08 ENCOUNTER — Other Ambulatory Visit: Payer: Self-pay | Admitting: Physician Assistant

## 2024-08-10 ENCOUNTER — Other Ambulatory Visit (HOSPITAL_COMMUNITY): Payer: Self-pay

## 2024-08-10 ENCOUNTER — Other Ambulatory Visit: Payer: Self-pay

## 2024-08-10 MED ORDER — TRAMADOL HCL 50 MG PO TABS
50.0000 mg | ORAL_TABLET | Freq: Two times a day (BID) | ORAL | 1 refills | Status: AC | PRN
  Filled 2024-08-10 – 2024-09-15 (×5): qty 30, 15d supply, fill #0
  Filled 2024-10-06 – 2024-10-12 (×2): qty 30, 15d supply, fill #1

## 2024-08-11 ENCOUNTER — Other Ambulatory Visit: Payer: Self-pay

## 2024-08-11 ENCOUNTER — Other Ambulatory Visit (HOSPITAL_COMMUNITY): Payer: Self-pay

## 2024-08-11 MED ORDER — ALPRAZOLAM 1 MG PO TABS
0.5000 mg | ORAL_TABLET | Freq: Two times a day (BID) | ORAL | 0 refills | Status: DC | PRN
  Filled 2024-08-11 – 2024-09-15 (×5): qty 60, 30d supply, fill #0

## 2024-08-12 ENCOUNTER — Other Ambulatory Visit (HOSPITAL_COMMUNITY): Payer: Self-pay

## 2024-08-12 ENCOUNTER — Other Ambulatory Visit: Payer: Self-pay

## 2024-08-13 ENCOUNTER — Other Ambulatory Visit (HOSPITAL_COMMUNITY): Payer: Self-pay

## 2024-08-14 ENCOUNTER — Other Ambulatory Visit (HOSPITAL_COMMUNITY): Payer: Self-pay

## 2024-08-14 ENCOUNTER — Encounter: Payer: Self-pay | Admitting: Physician Assistant

## 2024-08-14 ENCOUNTER — Other Ambulatory Visit: Payer: Self-pay

## 2024-08-14 ENCOUNTER — Ambulatory Visit (INDEPENDENT_AMBULATORY_CARE_PROVIDER_SITE_OTHER): Payer: Self-pay | Admitting: Physician Assistant

## 2024-08-14 VITALS — BP 107/70 | HR 84

## 2024-08-14 DIAGNOSIS — G43709 Chronic migraine without aura, not intractable, without status migrainosus: Secondary | ICD-10-CM | POA: Diagnosis not present

## 2024-08-14 DIAGNOSIS — M62838 Other muscle spasm: Secondary | ICD-10-CM | POA: Diagnosis not present

## 2024-08-14 DIAGNOSIS — G43009 Migraine without aura, not intractable, without status migrainosus: Secondary | ICD-10-CM

## 2024-08-14 MED ORDER — GABAPENTIN 400 MG PO CAPS
400.0000 mg | ORAL_CAPSULE | Freq: Three times a day (TID) | ORAL | 2 refills | Status: AC
  Filled 2024-08-14 – 2024-08-15 (×2): qty 270, 90d supply, fill #0

## 2024-08-14 MED ORDER — PROMETHAZINE HCL 25 MG PO TABS
25.0000 mg | ORAL_TABLET | Freq: Four times a day (QID) | ORAL | 1 refills | Status: DC | PRN
  Filled 2024-08-15: qty 60, 15d supply, fill #0
  Filled 2024-10-05 (×2): qty 60, 15d supply, fill #1

## 2024-08-14 MED ORDER — BACLOFEN 10 MG PO TABS
10.0000 mg | ORAL_TABLET | ORAL | 2 refills | Status: AC | PRN
  Filled 2024-08-15: qty 90, 90d supply, fill #0
  Filled 2024-10-27: qty 90, 90d supply, fill #1

## 2024-08-14 MED ORDER — ONABOTULINUMTOXINA 200 UNITS IJ SOLR
200.0000 [IU] | Freq: Once | INTRAMUSCULAR | Status: AC
  Administered 2024-08-14: 200 [IU] via INTRAMUSCULAR

## 2024-08-14 MED ORDER — REYVOW 100 MG PO TABS: 100.0000 mg | ORAL_TABLET | ORAL | 5 refills | Status: DC | PRN

## 2024-08-14 NOTE — Progress Notes (Signed)
 CC: Botox  today  Office supplied   S: Pt in office today for Botox  injections. Her Botox  has been helpful for migraine prevention. However she notes increase in headaches since termination from her job in September.     O: BP 107/70   Pulse 84    Botox  Procedure Note Vial of Botox  was : Supplied by [employer]  Dosing by [facility] for Chronic Migraine   Injection Sites for Migraines  Botox  155 units was injected using the dosage in the table above in the pattern shown above.  A: Migraine  Muscle spasm   P: Botox  155 units injected today.  Meds refilled as requested.   RTC 3 Months.

## 2024-08-15 ENCOUNTER — Other Ambulatory Visit (HOSPITAL_COMMUNITY): Payer: Self-pay

## 2024-08-17 ENCOUNTER — Other Ambulatory Visit (HOSPITAL_COMMUNITY): Payer: Self-pay

## 2024-08-17 ENCOUNTER — Other Ambulatory Visit: Payer: Self-pay

## 2024-08-18 ENCOUNTER — Telehealth: Payer: Self-pay | Admitting: Physician Assistant

## 2024-08-18 NOTE — Telephone Encounter (Signed)
 Pt is calling in stating that she needs to speak with [place] about her medication and how it needs to be sent in with the new insurance so it will pay for it.

## 2024-08-19 ENCOUNTER — Other Ambulatory Visit (HOSPITAL_COMMUNITY): Payer: Self-pay

## 2024-08-19 ENCOUNTER — Other Ambulatory Visit (HOSPITAL_BASED_OUTPATIENT_CLINIC_OR_DEPARTMENT_OTHER): Payer: Self-pay

## 2024-08-19 MED ORDER — DEXLANSOPRAZOLE 60 MG PO CPDR
60.0000 mg | DELAYED_RELEASE_CAPSULE | Freq: Every day | ORAL | 4 refills | Status: AC
  Filled 2024-08-19 – 2024-09-15 (×3): qty 90, 90d supply, fill #0

## 2024-08-19 MED ORDER — DEXLANSOPRAZOLE 60 MG PO CPDR
60.0000 mg | DELAYED_RELEASE_CAPSULE | Freq: Every day | ORAL | 3 refills | Status: DC
  Filled 2024-10-27: qty 30, 30d supply, fill #0

## 2024-08-19 MED ORDER — LINZESS 290 MCG PO CAPS
290.0000 ug | ORAL_CAPSULE | Freq: Every day | ORAL | 4 refills | Status: AC
  Filled 2024-08-19 – 2024-10-27 (×4): qty 90, 90d supply, fill #0

## 2024-08-21 ENCOUNTER — Other Ambulatory Visit (HOSPITAL_COMMUNITY): Payer: Self-pay

## 2024-08-24 ENCOUNTER — Other Ambulatory Visit (HOSPITAL_COMMUNITY): Payer: Self-pay

## 2024-08-25 ENCOUNTER — Other Ambulatory Visit (HOSPITAL_COMMUNITY): Payer: Self-pay

## 2024-08-26 ENCOUNTER — Other Ambulatory Visit: Payer: Self-pay

## 2024-08-27 ENCOUNTER — Other Ambulatory Visit (HOSPITAL_COMMUNITY): Payer: Self-pay

## 2024-08-28 ENCOUNTER — Other Ambulatory Visit (HOSPITAL_COMMUNITY): Payer: Self-pay

## 2024-08-28 ENCOUNTER — Other Ambulatory Visit: Payer: Self-pay

## 2024-08-28 ENCOUNTER — Encounter (HOSPITAL_COMMUNITY): Payer: Self-pay

## 2024-08-28 ENCOUNTER — Other Ambulatory Visit: Payer: Self-pay | Admitting: Physician Assistant

## 2024-08-28 MED ORDER — REYVOW 100 MG PO TABS
200.0000 mg | ORAL_TABLET | Freq: Every day | ORAL | 1 refills | Status: DC | PRN
  Filled 2024-08-28: qty 48, 84d supply, fill #0
  Filled 2024-09-07: qty 24, 84d supply, fill #0
  Filled 2024-09-21: qty 48, 24d supply, fill #0

## 2024-08-28 MED ORDER — PANTOPRAZOLE SODIUM 40 MG PO TBEC
40.0000 mg | DELAYED_RELEASE_TABLET | Freq: Every day | ORAL | 3 refills | Status: AC
  Filled 2024-08-28: qty 90, 90d supply, fill #0

## 2024-08-28 MED ORDER — REYVOW 100 MG PO TABS: 200.0000 mg | ORAL_TABLET | ORAL | 1 refills | Status: DC | PRN

## 2024-08-28 NOTE — Progress Notes (Signed)
 Pt requested 90 day supply of reyvow  - uses 200mg  as needed for headache rescue.   Phoned in to [facility] outpatient pharmacy by [facility] on 08/28/24.

## 2024-08-29 ENCOUNTER — Other Ambulatory Visit (HOSPITAL_COMMUNITY): Payer: Self-pay

## 2024-08-31 ENCOUNTER — Other Ambulatory Visit (HOSPITAL_COMMUNITY): Payer: Self-pay

## 2024-09-01 ENCOUNTER — Other Ambulatory Visit: Payer: Self-pay

## 2024-09-01 ENCOUNTER — Other Ambulatory Visit (HOSPITAL_COMMUNITY): Payer: Self-pay

## 2024-09-01 MED ORDER — REYVOW 100 MG PO TABS
100.0000 mg | ORAL_TABLET | Freq: Every day | ORAL | 1 refills | Status: DC | PRN
  Filled 2024-09-01: qty 8, 23d supply, fill #0
  Filled 2024-09-03: qty 8, 28d supply, fill #0
  Filled 2024-09-04 (×2): qty 8, 23d supply, fill #0
  Filled 2024-09-14: qty 30, 30d supply, fill #0
  Filled 2024-09-19: qty 8, 23d supply, fill #0
  Filled 2024-09-22: qty 24, 64d supply, fill #0
  Filled 2024-09-22: qty 30, 90d supply, fill #0

## 2024-09-03 ENCOUNTER — Encounter: Payer: Self-pay | Admitting: Pharmacist

## 2024-09-03 ENCOUNTER — Other Ambulatory Visit (HOSPITAL_COMMUNITY): Payer: Self-pay

## 2024-09-03 ENCOUNTER — Other Ambulatory Visit: Payer: Self-pay

## 2024-09-04 ENCOUNTER — Other Ambulatory Visit (HOSPITAL_COMMUNITY): Payer: Self-pay

## 2024-09-07 ENCOUNTER — Other Ambulatory Visit (HOSPITAL_COMMUNITY): Payer: Self-pay

## 2024-09-08 ENCOUNTER — Other Ambulatory Visit (HOSPITAL_COMMUNITY): Payer: Self-pay

## 2024-09-09 ENCOUNTER — Other Ambulatory Visit (HOSPITAL_COMMUNITY): Payer: Self-pay

## 2024-09-09 ENCOUNTER — Encounter: Payer: Self-pay | Admitting: Physician Assistant

## 2024-09-14 ENCOUNTER — Other Ambulatory Visit (HOSPITAL_COMMUNITY): Payer: Self-pay

## 2024-09-14 ENCOUNTER — Encounter: Payer: Self-pay | Admitting: *Deleted

## 2024-09-14 MED ORDER — VITAMIN D (ERGOCALCIFEROL) 1.25 MG (50000 UNIT) PO CAPS
50000.0000 [IU] | ORAL_CAPSULE | ORAL | 3 refills | Status: AC
  Filled 2024-09-14 – 2024-09-15 (×3): qty 24, 84d supply, fill #0

## 2024-09-15 ENCOUNTER — Other Ambulatory Visit: Payer: Self-pay

## 2024-09-15 ENCOUNTER — Other Ambulatory Visit (HOSPITAL_COMMUNITY): Payer: Self-pay

## 2024-09-15 MED ORDER — ZOLPIDEM TARTRATE ER 12.5 MG PO TBCR
EXTENDED_RELEASE_TABLET | ORAL | 0 refills | Status: AC
  Filled 2024-09-15: qty 90, 90d supply, fill #0

## 2024-09-16 ENCOUNTER — Other Ambulatory Visit (HOSPITAL_COMMUNITY): Payer: Self-pay

## 2024-09-16 ENCOUNTER — Other Ambulatory Visit: Payer: Self-pay

## 2024-09-19 ENCOUNTER — Other Ambulatory Visit (HOSPITAL_COMMUNITY): Payer: Self-pay

## 2024-09-21 ENCOUNTER — Other Ambulatory Visit (HOSPITAL_COMMUNITY): Payer: Self-pay

## 2024-09-22 ENCOUNTER — Other Ambulatory Visit: Payer: Self-pay | Admitting: Physician Assistant

## 2024-09-22 ENCOUNTER — Other Ambulatory Visit: Payer: Self-pay

## 2024-09-22 ENCOUNTER — Other Ambulatory Visit (HOSPITAL_COMMUNITY): Payer: Self-pay

## 2024-09-22 MED ORDER — REYVOW 100 MG PO TABS: 200.0000 mg | ORAL_TABLET | ORAL | 4 refills | Status: AC | PRN

## 2024-09-22 NOTE — Progress Notes (Signed)
 Rx for reyvow  called in for 16 tabs to last 35 days with 4 refills.  Pt uses 2 of the 100mg  tabs at the same time for rescue of migraine unresponsive to other treatment.   Pt aware to avoid driving for 8 hours and triptans for 24 hours   *Per pharmacy pt can only get 8 tabs per her insurance

## 2024-09-23 ENCOUNTER — Other Ambulatory Visit (HOSPITAL_COMMUNITY): Payer: Self-pay

## 2024-09-25 ENCOUNTER — Other Ambulatory Visit (HOSPITAL_COMMUNITY): Payer: Self-pay

## 2024-09-29 ENCOUNTER — Other Ambulatory Visit (HOSPITAL_COMMUNITY): Payer: Self-pay

## 2024-09-29 ENCOUNTER — Encounter (HOSPITAL_COMMUNITY): Payer: Self-pay | Admitting: Pharmacist

## 2024-09-29 MED ORDER — DEXLANSOPRAZOLE 60 MG PO CPDR: 60.0000 mg | DELAYED_RELEASE_CAPSULE | Freq: Every morning | ORAL | 4 refills | Status: AC

## 2024-09-29 MED ORDER — LUBIPROSTONE 24 MCG PO CAPS
24.0000 ug | ORAL_CAPSULE | Freq: Two times a day (BID) | ORAL | 12 refills | Status: AC
  Filled 2024-09-29 – 2024-10-29 (×3): qty 60, 30d supply, fill #0

## 2024-09-30 ENCOUNTER — Other Ambulatory Visit (HOSPITAL_COMMUNITY): Payer: Self-pay

## 2024-10-01 ENCOUNTER — Other Ambulatory Visit (HOSPITAL_COMMUNITY): Payer: Self-pay

## 2024-10-01 MED ORDER — REYVOW 100 MG PO TABS
200.0000 mg | ORAL_TABLET | Freq: Every day | ORAL | 4 refills | Status: AC | PRN
  Filled 2024-10-01: qty 16, 35d supply, fill #0

## 2024-10-02 ENCOUNTER — Other Ambulatory Visit: Payer: Self-pay

## 2024-10-05 ENCOUNTER — Other Ambulatory Visit (HOSPITAL_COMMUNITY): Payer: Self-pay

## 2024-10-05 ENCOUNTER — Other Ambulatory Visit: Payer: Self-pay

## 2024-10-05 MED ORDER — ATORVASTATIN CALCIUM 40 MG PO TABS
40.0000 mg | ORAL_TABLET | Freq: Every day | ORAL | 0 refills | Status: AC
  Filled 2024-10-05: qty 90, 90d supply, fill #0

## 2024-10-05 MED ORDER — CYCLOBENZAPRINE HCL 10 MG PO TABS
10.0000 mg | ORAL_TABLET | Freq: Three times a day (TID) | ORAL | 1 refills | Status: AC
  Filled 2024-10-05: qty 30, 10d supply, fill #0
  Filled 2024-10-27: qty 30, 10d supply, fill #1

## 2024-10-06 ENCOUNTER — Other Ambulatory Visit: Payer: Self-pay

## 2024-10-06 ENCOUNTER — Other Ambulatory Visit (HOSPITAL_COMMUNITY): Payer: Self-pay

## 2024-10-07 ENCOUNTER — Other Ambulatory Visit (HOSPITAL_COMMUNITY): Payer: Self-pay

## 2024-10-08 ENCOUNTER — Encounter: Payer: Self-pay | Admitting: Pharmacy Technician

## 2024-10-08 ENCOUNTER — Other Ambulatory Visit (HOSPITAL_COMMUNITY): Payer: Self-pay

## 2024-10-08 ENCOUNTER — Other Ambulatory Visit: Payer: Self-pay

## 2024-10-09 ENCOUNTER — Other Ambulatory Visit (HOSPITAL_COMMUNITY): Payer: Self-pay

## 2024-10-09 ENCOUNTER — Other Ambulatory Visit: Payer: Self-pay

## 2024-10-12 ENCOUNTER — Other Ambulatory Visit (HOSPITAL_COMMUNITY): Payer: Self-pay

## 2024-10-16 ENCOUNTER — Other Ambulatory Visit (HOSPITAL_COMMUNITY): Payer: Self-pay

## 2024-10-16 MED ORDER — ALPRAZOLAM 1 MG PO TABS
0.5000 mg | ORAL_TABLET | Freq: Two times a day (BID) | ORAL | 0 refills | Status: AC
  Filled 2024-10-17: qty 60, 30d supply, fill #0

## 2024-10-17 ENCOUNTER — Other Ambulatory Visit (HOSPITAL_COMMUNITY): Payer: Self-pay

## 2024-10-22 ENCOUNTER — Other Ambulatory Visit (HOSPITAL_COMMUNITY): Payer: Self-pay

## 2024-10-22 MED ORDER — BENZONATATE 200 MG PO CAPS
200.0000 mg | ORAL_CAPSULE | Freq: Three times a day (TID) | ORAL | 0 refills | Status: AC | PRN
  Filled 2024-10-22: qty 20, 7d supply, fill #0

## 2024-10-22 MED ORDER — PROMETHAZINE-DM 6.25-15 MG/5ML PO SYRP
5.0000 mL | ORAL_SOLUTION | Freq: Four times a day (QID) | ORAL | 0 refills | Status: AC | PRN
  Filled 2024-10-22: qty 118, 6d supply, fill #0

## 2024-10-22 MED ORDER — FLUCONAZOLE 150 MG PO TABS
150.0000 mg | ORAL_TABLET | ORAL | 0 refills | Status: AC
  Filled 2024-10-22: qty 2, 3d supply, fill #0

## 2024-10-22 MED ORDER — OSELTAMIVIR PHOSPHATE 75 MG PO CAPS
75.0000 mg | ORAL_CAPSULE | Freq: Two times a day (BID) | ORAL | 0 refills | Status: AC
  Filled 2024-10-22: qty 10, 5d supply, fill #0

## 2024-10-22 MED ORDER — AZITHROMYCIN 250 MG PO TABS
ORAL_TABLET | ORAL | 0 refills | Status: AC
  Filled 2024-10-22: qty 6, 5d supply, fill #0

## 2024-10-27 ENCOUNTER — Other Ambulatory Visit: Payer: Self-pay | Admitting: Physician Assistant

## 2024-10-27 ENCOUNTER — Other Ambulatory Visit: Payer: Self-pay

## 2024-10-27 ENCOUNTER — Other Ambulatory Visit (HOSPITAL_COMMUNITY): Payer: Self-pay

## 2024-10-27 DIAGNOSIS — G43009 Migraine without aura, not intractable, without status migrainosus: Secondary | ICD-10-CM

## 2024-10-27 MED ORDER — OMEPRAZOLE 40 MG PO CPDR: 40.0000 mg | DELAYED_RELEASE_CAPSULE | Freq: Every day | ORAL | 1 refills | Status: AC

## 2024-10-28 ENCOUNTER — Other Ambulatory Visit (HOSPITAL_COMMUNITY): Payer: Self-pay

## 2024-10-29 ENCOUNTER — Other Ambulatory Visit (HOSPITAL_COMMUNITY): Payer: Self-pay

## 2024-10-29 ENCOUNTER — Other Ambulatory Visit: Payer: Self-pay

## 2024-10-29 ENCOUNTER — Encounter: Payer: Self-pay | Admitting: Pharmacy Technician

## 2024-10-29 MED ORDER — MECLIZINE HCL 25 MG PO TABS
25.0000 mg | ORAL_TABLET | Freq: Three times a day (TID) | ORAL | 0 refills | Status: AC | PRN
  Filled 2024-10-29: qty 30, 10d supply, fill #0

## 2024-10-30 ENCOUNTER — Other Ambulatory Visit (HOSPITAL_COMMUNITY): Payer: Self-pay

## 2024-10-30 ENCOUNTER — Other Ambulatory Visit: Payer: Self-pay

## 2024-10-30 MED ORDER — PROMETHAZINE HCL 25 MG PO TABS
25.0000 mg | ORAL_TABLET | Freq: Four times a day (QID) | ORAL | 1 refills | Status: AC | PRN
  Filled 2024-10-30 (×2): qty 60, 15d supply, fill #0

## 2024-11-06 ENCOUNTER — Encounter: Admitting: Physician Assistant
